# Patient Record
Sex: Female | Born: 1960
Health system: Southern US, Community
[De-identification: ages and names within clinical notes are randomized; demographics above are authoritative.]

## PROBLEM LIST (undated history)

## (undated) DIAGNOSIS — F0390 Unspecified dementia without behavioral disturbance: Secondary | ICD-10-CM

## (undated) DIAGNOSIS — M1990 Unspecified osteoarthritis, unspecified site: Secondary | ICD-10-CM

## (undated) DIAGNOSIS — F03918 Unspecified dementia, unspecified severity, with other behavioral disturbance: Secondary | ICD-10-CM

## (undated) DIAGNOSIS — J45909 Unspecified asthma, uncomplicated: Secondary | ICD-10-CM

## (undated) DIAGNOSIS — I10 Essential (primary) hypertension: Secondary | ICD-10-CM

## (undated) DIAGNOSIS — F0391 Unspecified dementia with behavioral disturbance: Secondary | ICD-10-CM

## (undated) DIAGNOSIS — F17200 Nicotine dependence, unspecified, uncomplicated: Secondary | ICD-10-CM

## (undated) HISTORY — PX: SALPINGOOPHORECTOMY: SHX82

## (undated) HISTORY — DX: Unspecified dementia with behavioral disturbance: F03.91

## (undated) HISTORY — PX: ANKLE SURGERY: SHX546

## (undated) HISTORY — DX: Nicotine dependence, unspecified, uncomplicated: F17.200

## (undated) HISTORY — DX: Unspecified dementia, unspecified severity, with other behavioral disturbance: F03.918

## (undated) HISTORY — PX: ABDOMINAL HYSTERECTOMY: SHX81

## (undated) HISTORY — PX: TOTAL ABDOMINAL HYSTERECTOMY W/ BILATERAL SALPINGOOPHORECTOMY: SHX83

---
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
## 2002-03-03 ENCOUNTER — Emergency Department (HOSPITAL_COMMUNITY): Admission: EM | Admit: 2002-03-03 | Discharge: 2002-03-03 | Payer: Self-pay | Admitting: Emergency Medicine

## 2002-06-17 ENCOUNTER — Emergency Department (HOSPITAL_COMMUNITY): Admission: EM | Admit: 2002-06-17 | Discharge: 2002-06-17 | Payer: Self-pay | Admitting: Emergency Medicine

## 2003-01-06 ENCOUNTER — Emergency Department (HOSPITAL_COMMUNITY): Admission: AD | Admit: 2003-01-06 | Discharge: 2003-01-06 | Payer: Self-pay | Admitting: Family Medicine

## 2003-01-13 ENCOUNTER — Encounter: Admission: RE | Admit: 2003-01-13 | Discharge: 2003-01-13 | Payer: Self-pay | Admitting: Family Medicine

## 2003-03-10 ENCOUNTER — Emergency Department (HOSPITAL_COMMUNITY): Admission: AD | Admit: 2003-03-10 | Discharge: 2003-03-10 | Payer: Self-pay | Admitting: Family Medicine

## 2003-08-19 ENCOUNTER — Encounter: Admission: RE | Admit: 2003-08-19 | Discharge: 2003-08-19 | Payer: Self-pay | Admitting: Family Medicine

## 2003-08-19 ENCOUNTER — Other Ambulatory Visit: Admission: RE | Admit: 2003-08-19 | Discharge: 2003-08-19 | Payer: Self-pay | Admitting: Family Medicine

## 2003-09-09 ENCOUNTER — Encounter: Admission: RE | Admit: 2003-09-09 | Discharge: 2003-09-09 | Payer: Self-pay | Admitting: Family Medicine

## 2003-10-27 ENCOUNTER — Ambulatory Visit: Payer: Self-pay | Admitting: Family Medicine

## 2004-01-14 ENCOUNTER — Encounter: Admission: RE | Admit: 2004-01-14 | Discharge: 2004-01-14 | Payer: Self-pay | Admitting: Sports Medicine

## 2004-03-29 ENCOUNTER — Emergency Department (HOSPITAL_COMMUNITY): Admission: EM | Admit: 2004-03-29 | Discharge: 2004-03-29 | Payer: Self-pay | Admitting: Family Medicine

## 2004-04-28 ENCOUNTER — Ambulatory Visit: Payer: Self-pay | Admitting: Family Medicine

## 2004-05-06 ENCOUNTER — Emergency Department (HOSPITAL_COMMUNITY): Admission: EM | Admit: 2004-05-06 | Discharge: 2004-05-06 | Payer: Self-pay | Admitting: Family Medicine

## 2005-03-21 ENCOUNTER — Emergency Department (HOSPITAL_COMMUNITY): Admission: EM | Admit: 2005-03-21 | Discharge: 2005-03-21 | Payer: Self-pay | Admitting: Emergency Medicine

## 2005-03-31 ENCOUNTER — Ambulatory Visit: Payer: Self-pay | Admitting: Sports Medicine

## 2005-05-20 ENCOUNTER — Emergency Department (HOSPITAL_COMMUNITY): Admission: EM | Admit: 2005-05-20 | Discharge: 2005-05-20 | Payer: Self-pay | Admitting: Emergency Medicine

## 2005-06-18 ENCOUNTER — Emergency Department (HOSPITAL_COMMUNITY): Admission: EM | Admit: 2005-06-18 | Discharge: 2005-06-18 | Payer: Self-pay | Admitting: Family Medicine

## 2005-07-17 ENCOUNTER — Emergency Department (HOSPITAL_COMMUNITY): Admission: EM | Admit: 2005-07-17 | Discharge: 2005-07-17 | Payer: Self-pay | Admitting: Emergency Medicine

## 2005-08-13 ENCOUNTER — Encounter (INDEPENDENT_AMBULATORY_CARE_PROVIDER_SITE_OTHER): Payer: Self-pay | Admitting: *Deleted

## 2005-08-13 LAB — CONVERTED CEMR LAB

## 2005-09-11 ENCOUNTER — Ambulatory Visit: Payer: Self-pay | Admitting: Sports Medicine

## 2005-09-11 ENCOUNTER — Other Ambulatory Visit: Admission: RE | Admit: 2005-09-11 | Discharge: 2005-09-11 | Payer: Self-pay | Admitting: Family Medicine

## 2005-12-27 ENCOUNTER — Emergency Department (HOSPITAL_COMMUNITY): Admission: EM | Admit: 2005-12-27 | Discharge: 2005-12-27 | Payer: Self-pay | Admitting: Emergency Medicine

## 2006-04-03 ENCOUNTER — Emergency Department (HOSPITAL_COMMUNITY): Admission: EM | Admit: 2006-04-03 | Discharge: 2006-04-03 | Payer: Self-pay | Admitting: Emergency Medicine

## 2006-04-12 DIAGNOSIS — F339 Major depressive disorder, recurrent, unspecified: Secondary | ICD-10-CM

## 2006-04-12 DIAGNOSIS — E669 Obesity, unspecified: Secondary | ICD-10-CM

## 2006-04-12 DIAGNOSIS — I10 Essential (primary) hypertension: Secondary | ICD-10-CM

## 2006-04-12 DIAGNOSIS — F17200 Nicotine dependence, unspecified, uncomplicated: Secondary | ICD-10-CM | POA: Insufficient documentation

## 2006-04-13 ENCOUNTER — Encounter (INDEPENDENT_AMBULATORY_CARE_PROVIDER_SITE_OTHER): Payer: Self-pay | Admitting: *Deleted

## 2006-06-27 ENCOUNTER — Telehealth (INDEPENDENT_AMBULATORY_CARE_PROVIDER_SITE_OTHER): Payer: Self-pay | Admitting: Family Medicine

## 2006-07-04 ENCOUNTER — Telehealth (INDEPENDENT_AMBULATORY_CARE_PROVIDER_SITE_OTHER): Payer: Self-pay | Admitting: Family Medicine

## 2006-07-31 ENCOUNTER — Telehealth (INDEPENDENT_AMBULATORY_CARE_PROVIDER_SITE_OTHER): Payer: Self-pay | Admitting: Family Medicine

## 2006-08-01 ENCOUNTER — Telehealth (INDEPENDENT_AMBULATORY_CARE_PROVIDER_SITE_OTHER): Payer: Self-pay | Admitting: Family Medicine

## 2006-08-02 ENCOUNTER — Telehealth (INDEPENDENT_AMBULATORY_CARE_PROVIDER_SITE_OTHER): Payer: Self-pay | Admitting: *Deleted

## 2006-08-03 ENCOUNTER — Telehealth (INDEPENDENT_AMBULATORY_CARE_PROVIDER_SITE_OTHER): Payer: Self-pay | Admitting: *Deleted

## 2007-03-18 ENCOUNTER — Encounter (INDEPENDENT_AMBULATORY_CARE_PROVIDER_SITE_OTHER): Payer: Self-pay | Admitting: Family Medicine

## 2007-03-18 ENCOUNTER — Ambulatory Visit: Payer: Self-pay | Admitting: Family Medicine

## 2007-03-18 ENCOUNTER — Other Ambulatory Visit: Admission: RE | Admit: 2007-03-18 | Discharge: 2007-03-18 | Payer: Self-pay | Admitting: Family Medicine

## 2007-03-18 LAB — CONVERTED CEMR LAB
BUN: 16 mg/dL (ref 6–23)
Bilirubin Urine: NEGATIVE
Blood in Urine, dipstick: NEGATIVE
CO2: 25 meq/L (ref 19–32)
Calcium: 9.3 mg/dL (ref 8.4–10.5)
Chloride: 106 meq/L (ref 96–112)
Cholesterol: 215 mg/dL — ABNORMAL HIGH (ref 0–200)
Creatinine, Ser: 0.81 mg/dL (ref 0.40–1.20)
Glucose, Urine, Semiquant: NEGATIVE
HDL: 60 mg/dL (ref >39)
Ketones, urine, test strip: NEGATIVE
LDL Cholesterol: 148 mg/dL — ABNORMAL HIGH (ref 0–99)
Potassium: 4.2 meq/L (ref 3.5–5.3)
Protein, U semiquant: NEGATIVE
Total Bilirubin: 0.4 mg/dL (ref 0.3–1.2)
Total CHOL/HDL Ratio: 3.6
Triglycerides: 36 mg/dL (ref <150)
VLDL: 7 mg/dL (ref 0–40)
WBC Urine, dipstick: NEGATIVE

## 2007-03-19 ENCOUNTER — Encounter (INDEPENDENT_AMBULATORY_CARE_PROVIDER_SITE_OTHER): Payer: Self-pay | Admitting: Family Medicine

## 2007-03-20 ENCOUNTER — Encounter (INDEPENDENT_AMBULATORY_CARE_PROVIDER_SITE_OTHER): Payer: Self-pay | Admitting: Family Medicine

## 2007-04-01 ENCOUNTER — Encounter: Payer: Self-pay | Admitting: *Deleted

## 2007-05-23 ENCOUNTER — Emergency Department (HOSPITAL_COMMUNITY): Admission: EM | Admit: 2007-05-23 | Discharge: 2007-05-23 | Payer: Self-pay | Admitting: Emergency Medicine

## 2007-06-12 ENCOUNTER — Ambulatory Visit: Payer: Self-pay | Admitting: Family Medicine

## 2007-06-12 ENCOUNTER — Telehealth: Payer: Self-pay | Admitting: *Deleted

## 2007-06-17 ENCOUNTER — Telehealth (INDEPENDENT_AMBULATORY_CARE_PROVIDER_SITE_OTHER): Payer: Self-pay | Admitting: Family Medicine

## 2007-06-26 ENCOUNTER — Telehealth: Payer: Self-pay | Admitting: *Deleted

## 2007-06-26 ENCOUNTER — Encounter (INDEPENDENT_AMBULATORY_CARE_PROVIDER_SITE_OTHER): Payer: Self-pay | Admitting: Family Medicine

## 2007-06-26 ENCOUNTER — Ambulatory Visit: Payer: Self-pay | Admitting: Family Medicine

## 2007-06-26 LAB — CONVERTED CEMR LAB
CO2: 26 meq/L (ref 19–32)
Calcium: 8.7 mg/dL (ref 8.4–10.5)
Chloride: 108 meq/L (ref 96–112)
Creatinine, Ser: 0.74 mg/dL (ref 0.40–1.20)
Glucose, Bld: 84 mg/dL (ref 70–99)

## 2007-06-28 ENCOUNTER — Encounter (INDEPENDENT_AMBULATORY_CARE_PROVIDER_SITE_OTHER): Payer: Self-pay | Admitting: Family Medicine

## 2007-07-10 ENCOUNTER — Encounter: Payer: Self-pay | Admitting: *Deleted

## 2007-07-10 ENCOUNTER — Ambulatory Visit: Payer: Self-pay | Admitting: Family Medicine

## 2007-07-10 ENCOUNTER — Encounter: Admission: RE | Admit: 2007-07-10 | Discharge: 2007-07-10 | Payer: Self-pay | Admitting: *Deleted

## 2007-07-10 LAB — CONVERTED CEMR LAB: Rapid Strep: NEGATIVE

## 2007-07-16 ENCOUNTER — Telehealth: Payer: Self-pay | Admitting: *Deleted

## 2007-08-05 ENCOUNTER — Encounter (INDEPENDENT_AMBULATORY_CARE_PROVIDER_SITE_OTHER): Payer: Self-pay | Admitting: Family Medicine

## 2007-08-05 ENCOUNTER — Emergency Department (HOSPITAL_COMMUNITY): Admission: EM | Admit: 2007-08-05 | Discharge: 2007-08-05 | Payer: Self-pay | Admitting: Emergency Medicine

## 2007-08-08 ENCOUNTER — Emergency Department (HOSPITAL_COMMUNITY): Admission: EM | Admit: 2007-08-08 | Discharge: 2007-08-08 | Payer: Self-pay | Admitting: Family Medicine

## 2007-08-12 ENCOUNTER — Telehealth: Payer: Self-pay | Admitting: *Deleted

## 2007-08-13 ENCOUNTER — Encounter: Payer: Self-pay | Admitting: Family Medicine

## 2007-08-13 ENCOUNTER — Ambulatory Visit: Payer: Self-pay | Admitting: Family Medicine

## 2007-08-19 ENCOUNTER — Encounter: Payer: Self-pay | Admitting: Family Medicine

## 2007-08-29 ENCOUNTER — Ambulatory Visit: Payer: Self-pay | Admitting: Family Medicine

## 2007-08-30 ENCOUNTER — Encounter: Payer: Self-pay | Admitting: Family Medicine

## 2007-10-04 ENCOUNTER — Telehealth: Payer: Self-pay | Admitting: *Deleted

## 2007-10-31 ENCOUNTER — Ambulatory Visit: Payer: Self-pay | Admitting: Family Medicine

## 2007-11-06 ENCOUNTER — Telehealth: Payer: Self-pay | Admitting: Family Medicine

## 2007-11-28 ENCOUNTER — Encounter: Payer: Self-pay | Admitting: *Deleted

## 2007-12-06 ENCOUNTER — Telehealth: Payer: Self-pay | Admitting: *Deleted

## 2007-12-06 ENCOUNTER — Ambulatory Visit: Payer: Self-pay | Admitting: Family Medicine

## 2007-12-07 ENCOUNTER — Encounter (INDEPENDENT_AMBULATORY_CARE_PROVIDER_SITE_OTHER): Payer: Self-pay | Admitting: Family Medicine

## 2007-12-20 ENCOUNTER — Emergency Department (HOSPITAL_COMMUNITY): Admission: EM | Admit: 2007-12-20 | Discharge: 2007-12-20 | Payer: Self-pay | Admitting: Family Medicine

## 2007-12-20 ENCOUNTER — Telehealth (INDEPENDENT_AMBULATORY_CARE_PROVIDER_SITE_OTHER): Payer: Self-pay | Admitting: *Deleted

## 2008-02-11 ENCOUNTER — Encounter: Payer: Self-pay | Admitting: Family Medicine

## 2008-02-11 ENCOUNTER — Ambulatory Visit: Payer: Self-pay | Admitting: Family Medicine

## 2008-02-11 ENCOUNTER — Telehealth: Payer: Self-pay | Admitting: *Deleted

## 2008-02-11 LAB — CONVERTED CEMR LAB
Bilirubin Urine: NEGATIVE
Blood in Urine, dipstick: NEGATIVE
Chlamydia, DNA Probe: NEGATIVE
GC Probe Amp, Genital: NEGATIVE
Urobilinogen, UA: 0.2
WBC Urine, dipstick: NEGATIVE
pH: 6

## 2008-02-12 ENCOUNTER — Encounter: Payer: Self-pay | Admitting: Family Medicine

## 2008-03-09 ENCOUNTER — Ambulatory Visit: Payer: Self-pay | Admitting: Family Medicine

## 2008-03-11 ENCOUNTER — Telehealth: Payer: Self-pay | Admitting: Family Medicine

## 2008-03-17 ENCOUNTER — Ambulatory Visit: Payer: Self-pay | Admitting: Family Medicine

## 2008-03-17 LAB — CONVERTED CEMR LAB: Whiff Test: POSITIVE

## 2008-03-24 ENCOUNTER — Telehealth: Payer: Self-pay | Admitting: Family Medicine

## 2008-04-22 ENCOUNTER — Telehealth: Payer: Self-pay | Admitting: Family Medicine

## 2008-04-22 ENCOUNTER — Ambulatory Visit: Payer: Self-pay | Admitting: Family Medicine

## 2008-05-06 ENCOUNTER — Encounter: Payer: Self-pay | Admitting: Family Medicine

## 2008-05-07 ENCOUNTER — Ambulatory Visit: Payer: Self-pay | Admitting: Family Medicine

## 2008-05-07 ENCOUNTER — Encounter: Payer: Self-pay | Admitting: Family Medicine

## 2008-05-11 ENCOUNTER — Ambulatory Visit: Payer: Self-pay | Admitting: Sports Medicine

## 2008-05-11 DIAGNOSIS — M2140 Flat foot [pes planus] (acquired), unspecified foot: Secondary | ICD-10-CM | POA: Insufficient documentation

## 2008-05-11 DIAGNOSIS — M7750 Other enthesopathy of unspecified foot: Secondary | ICD-10-CM

## 2008-05-12 ENCOUNTER — Encounter: Payer: Self-pay | Admitting: *Deleted

## 2008-06-01 ENCOUNTER — Telehealth: Payer: Self-pay | Admitting: Family Medicine

## 2008-06-01 ENCOUNTER — Ambulatory Visit: Payer: Self-pay | Admitting: Family Medicine

## 2008-06-01 LAB — CONVERTED CEMR LAB
Blood in Urine, dipstick: NEGATIVE
Glucose, Urine, Semiquant: NEGATIVE
Protein, U semiquant: NEGATIVE
Urobilinogen, UA: 0.2
Whiff Test: POSITIVE
pH: 5.5

## 2008-06-08 ENCOUNTER — Ambulatory Visit: Payer: Self-pay | Admitting: Family Medicine

## 2008-09-18 ENCOUNTER — Telehealth: Payer: Self-pay | Admitting: *Deleted

## 2008-09-18 ENCOUNTER — Telehealth: Payer: Self-pay | Admitting: Family Medicine

## 2008-09-23 ENCOUNTER — Ambulatory Visit: Payer: Self-pay | Admitting: Family Medicine

## 2008-09-25 ENCOUNTER — Telehealth: Payer: Self-pay | Admitting: Family Medicine

## 2008-09-28 ENCOUNTER — Telehealth: Payer: Self-pay | Admitting: *Deleted

## 2008-09-28 ENCOUNTER — Telehealth: Payer: Self-pay | Admitting: Psychology

## 2008-10-08 ENCOUNTER — Ambulatory Visit: Payer: Self-pay | Admitting: Psychology

## 2008-10-22 ENCOUNTER — Telehealth: Payer: Self-pay | Admitting: Psychology

## 2008-10-30 ENCOUNTER — Telehealth: Payer: Self-pay | Admitting: Psychology

## 2009-01-13 ENCOUNTER — Encounter (INDEPENDENT_AMBULATORY_CARE_PROVIDER_SITE_OTHER): Payer: Self-pay

## 2009-01-23 ENCOUNTER — Emergency Department (HOSPITAL_COMMUNITY): Admission: EM | Admit: 2009-01-23 | Discharge: 2009-01-23 | Payer: Self-pay | Admitting: Family Medicine

## 2009-04-22 ENCOUNTER — Encounter: Payer: Self-pay | Admitting: Family Medicine

## 2009-04-22 ENCOUNTER — Ambulatory Visit: Payer: Self-pay | Admitting: Family Medicine

## 2009-04-22 LAB — CONVERTED CEMR LAB
CO2: 30 meq/L (ref 19–32)
Calcium: 10.2 mg/dL (ref 8.4–10.5)
Chloride: 103 meq/L (ref 96–112)
GC Probe Amp, Genital: NEGATIVE
MCHC: 32.7 g/dL (ref 30.0–36.0)
MCV: 94 fL (ref 78.0–100.0)
Pap Smear: NEGATIVE
RBC: 4.49 M/uL (ref 3.87–5.11)

## 2009-04-27 ENCOUNTER — Encounter: Payer: Self-pay | Admitting: Family Medicine

## 2009-07-16 ENCOUNTER — Encounter: Payer: Self-pay | Admitting: Family Medicine

## 2009-07-16 ENCOUNTER — Ambulatory Visit: Payer: Self-pay | Admitting: Family Medicine

## 2009-08-06 ENCOUNTER — Ambulatory Visit: Payer: Self-pay | Admitting: Family Medicine

## 2009-09-08 ENCOUNTER — Telehealth: Payer: Self-pay | Admitting: Family Medicine

## 2009-09-08 ENCOUNTER — Ambulatory Visit: Payer: Self-pay | Admitting: Family Medicine

## 2009-10-27 ENCOUNTER — Ambulatory Visit: Payer: Self-pay | Admitting: Family Medicine

## 2009-10-28 ENCOUNTER — Telehealth: Payer: Self-pay | Admitting: *Deleted

## 2009-11-03 ENCOUNTER — Encounter: Payer: Self-pay | Admitting: Family Medicine

## 2009-11-03 ENCOUNTER — Ambulatory Visit: Payer: Self-pay | Admitting: Family Medicine

## 2009-11-08 ENCOUNTER — Encounter: Payer: Self-pay | Admitting: Family Medicine

## 2010-02-08 ENCOUNTER — Encounter: Payer: Self-pay | Admitting: Family Medicine

## 2010-03-15 NOTE — Assessment & Plan Note (Signed)
Summary: F/U [facility]H   Vital Signs:  Patient profile:   50 year old female Height:      65 inches Weight:      269 pounds BMI:     44.93 Temp:     98.3 degrees F oral Pulse rate:   94 / minute BP sitting:   103 / 70  (right arm) Cuff size:   large  Vitals Entered By: Tessie Fass CMA (November 03, 2009 8:57 AM) CC: F/U dizzines, pain meds, rash Is Patient Diabetic? No Pain Assessment Patient in pain? yes     Location: right foot Intensity: 10   Primary Care Provider:  Milinda Antis MD  CC:  F/U dizzines, pain meds, and rash.  History of Present Illness:    Pt here to follow-up on her dizziness, prescribed Meclizine and loratadine, also on Cirpodex for otitis externa  - dizziness improved, takes meclizine once or twice a day, feels her eas are improving but feels like they need to pop, nasal swelling also improved  - No fever, no cough  Pain- chronic left ankle pain, s/p arthroscopy, PT pending, pt using voltaren gel and lidocaine patches, previously on Norco as needed , out of meds. Pain worse after acitvity, seen by another ortho  Rash on right arm- itchy feels like bumps spreading, no drainage, no sick contacts   Pt did note today is the 1 year anniversay of her son's death, but she feels relief is not mourning any more  Habits & Providers  Alcohol-Tobacco-Diet     Tobacco Status: current     Tobacco Counseling: to quit use of tobacco products     Other Tobacco cigar     Other per week 1  Allergies: No Known Drug Allergies  Physical Exam  General:  Well-developed,well-nourished,in no acute distress; alert,appropriate and cooperative throughout examination Vital signs noted obese  Ears:  TM clear bilat, no fluid noted, no erythema in canals or over pinna, pinna non tender bilaterally Nose:  large turbinates bilaterally, edematous, clear rhinorrhea Mouth:  Oral mucosa and oropharynx without lesions or exudates.   Lungs:  CTAB Heart:  RRR, no murmur Skin:   mild fine maculopapular rash on right deltoid, no erythema    Impression & Recommendations:  Problem # 1:  DIZZINESS (ICD-780.4) Assessment Improved   treated ? allergic componenet Her updated medication list for this problem includes:    Wal-zyr 10 Mg Tabs (Cetirizine hcl) .[name]... Take 1 pill every evening for allergies and ear fluid    Meclizine Hcl 25 Mg Tabs (Meclizine hcl) .[name]... Take 1 tab every 8-12 hours as needed for dizziness.  Orders: [facility]- Est  Level 4 (40981)  Problem # 2:  ANKLE PAIN, RIGHT (ICD-719.47) Assessment: Unchanged  refilled pain meds, PT pending, chronic problem for pt, pain meds sparingly  Orders: [facility]- Est  Level 4 (19147)  Problem # 3:  SKIN RASH (ICD-782.1) Assessment: New  small areas of contact dermatitis, non specific, as needed cortisone Her updated medication list for this problem includes:    Hydrocortisone 1 % Crea (Hydrocortisone) .[name]... Apply to area on arm two times a day as needed itch  Orders: [facility]- Est  Level 4 (99214)  Complete Medication List: 1)  Lotensin 40 Mg Tabs (Benazepril hcl) .[name].. 1 by mouth daily 2)  Hydrochlorothiazide 25 Mg Tabs (Hydrochlorothiazide) .[name].. 1 tab by mouth daily 3)  Flexeril 5 Mg Tabs (Cyclobenzaprine hcl) .[name].. 1 by mouth at bedtime 4)  Lexapro 20 Mg Tabs (Escitalopram oxalate) .Marland KitchenMarland KitchenMarland Kitchen  1 by mouth daily for mood 5)  Norco 5-325 Mg Tabs (Hydrocodone-acetaminophen) .[name].. 1 by mouth q 6hrs as needed pain 6)  Flonase 50 Mcg/act Susp (Fluticasone propionate) .[name].. 1 spray each nostril as needed 7)  Voltaren 1 % Gel (Diclofenac sodium) .... Apply to foot 3-4 times daily as directed as needed for pain disp: 100g 8)  Fluconazole 150 Mg Tabs (Fluconazole) .[name].. 1 tab by mouth x 1 dose.  repeat in 3 days. 9)  Lidoderm 5 % Ptch (Lidocaine) .... Apply to affected area for no more than 12 hours a day disp: 1 box 10)  Miralax Powd (Polyethylene glycol 3350) 11)  Ciprodex 0.3-0.1 % Susp (Ciprofloxacin-dexamethasone) .... 4 drops in  each ear two times a day x 7 days for ear infection. 1 bottle 12)  Wal-zyr 10 Mg Tabs (Cetirizine hcl) .... Take 1 pill every evening for allergies and ear fluid 13)  Meclizine Hcl 25 Mg Tabs (Meclizine hcl) .... Take 1 tab every 8-12 hours as needed for dizziness. 14)  Hydrocortisone 1 % Crea (Hydrocortisone) .... Apply to area on arm two times a day as needed itch  Patient Instructions: 1)  Follow up in 6 months for your blood pressure 2)  Continue with physical therapy 3)  If the rash does not improve then return Prescriptions: NORCO 5-325 MG TABS (HYDROCODONE-ACETAMINOPHEN) 1 by mouth q 6hrs as needed pain  #60 x 0   Entered and Authorized by:   Milinda Antis MD   Signed by:   Milinda Antis MD on 11/03/2009   Method used:   Handwritten   RxID:   [id number] LEXAPRO 20 MG TABS (ESCITALOPRAM OXALATE) 1 by mouth daily for mood  #30 x 3   Entered and Authorized by:   Milinda Antis MD   Signed by:   Milinda Antis MD on 11/03/2009   Method used:   Electronically to        [facility] [address] [phone]* (retail)       [address]       [address]       [place], [place]  [address]       Ph: [phone]       Fax: [phone]   RxID:   [id number] FLONASE 50 MCG/ACT SUSP (FLUTICASONE PROPIONATE) 1 spray each nostril as needed  #1 x 0   Entered and Authorized by:   Milinda Antis MD   Signed by:   Milinda Antis MD on 11/03/2009   Method used:   Electronically to        [facility] [address] [phone]* (retail)       [address]       [address]       [place], [place]  [address]       Ph: [phone]       Fax: [phone]   RxID:   [id number] FLEXERIL 5 MG TABS (CYCLOBENZAPRINE HCL) 1 by mouth at bedtime  #30 x 3   Entered and Authorized by:   Milinda Antis MD   Signed by:   Milinda Antis MD on 11/03/2009   Method used:   Electronically to        [facility] [address] [phone]* (retail)       [address]       [address]       [place], [place]   [address]       Ph: [phone]       Fax: [phone]  RxID:   [id number] HYDROCHLOROTHIAZIDE 25 MG TABS (HYDROCHLOROTHIAZIDE) 1 tab by mouth daily  #31 x 4   Entered and Authorized by:   Milinda Antis MD   Signed by:   Milinda Antis MD on 11/03/2009   Method used:   Electronically to        [facility] [address] [phone]* (retail)       [address]       [address]       [place], [place]  [address]       Ph: [phone]       Fax: [phone]   RxID:   [id number] LOTENSIN 40 MG  TABS (BENAZEPRIL HCL) 1 by mouth daily  #34 x 3   Entered and Authorized by:   Milinda Antis MD   Signed by:   Milinda Antis MD on 11/03/2009   Method used:   Electronically to        [facility] [address] [phone]* (retail)       [address]       [address]       [place], [place]  [address]       Ph: [phone]       Fax: [phone]   RxID:   [id number] HYDROCORTISONE 1 % CREA (HYDROCORTISONE) apply to area on arm two times a day as needed itch  #1 x 0   Entered and Authorized by:   Milinda Antis MD   Signed by:   Milinda Antis MD on 11/03/2009   Method used:   Electronically to        [facility] [address] [phone]* (retail)       [address]       [address]       [place], [place]  [address]       Ph: [phone]       Fax: [phone]   RxID:   [id number]

## 2010-03-15 NOTE — Assessment & Plan Note (Signed)
Summary: f/u bug bite,df   Vital Signs:  Patient profile:   50 year old female Height:      65 inches Weight:      265 pounds BMI:     44.26 Temp:     98.3 degrees F BP sitting:   134 / 82  (left arm) Cuff size:   large  Vitals Entered By: Tessie Fass CMA (August 06, 2009 10:25 AM) CC: F/U bug bite? Is Patient Diabetic? No   Primary Care Provider:  Milinda Antis MD  CC:  F/U bug bite?[name]  History of Present Illness: Sherry Hicks comes in to follow-up ankle pain and bug bite. 1) Bug bite - finished doxycycline.  Did warm compresses.  "oozed" for a few days, but has gotten much smaller.  Still worried about it though.  It is tender.  Seh presses on it and "messes with it" many times during the day 2) Ankle pain - feels better with lidoderm patches.  About to run out.   Allergies: No Known Drug Allergies  Physical Exam  General:  NAD, Vital signs noted obese Extremities:  no swelling of right ankle.  FROM Skin:  <.5 x .5 cm  area of induration.  at approx 4-5 o'clock in relation to umbilicus.  no surrounding erythema but there is surrounding skin darkening and peeling. Nontnder.  NO fluctuance   Impression & Recommendations:  Problem # 1:  CARBUNCLE AND FURUNCLE OF TRUNK (ICD-680.2) Assessment Improved  Improving. Nearly resolved.  Advised to just leave it along - don't mess with it or apply anything to it anymore.   Orders: [facility]- Est Level  3 (86578)  Problem # 2:  ANKLE PAIN, RIGHT (ICD-719.47) Assessment: Unchanged  Due to bone spurs and arthritis.  REfilled lidoderm patches.   Orders: [facility]- Est Level  3 (46962)  Complete Medication List: 1)  Lotensin 40 Mg Tabs (Benazepril hcl) .[name].. 1 by mouth daily 2)  Hydrochlorothiazide 25 Mg Tabs (Hydrochlorothiazide) .[name].. 1 tab by mouth daily 3)  Flexeril 5 Mg Tabs (Cyclobenzaprine hcl) .[name].. 1 by mouth at bedtime 4)  Lexapro 10 Mg Tabs (Escitalopram oxalate) .[name].. 1 by mouth daily x 1 week then increase to 2 tablets daily 5)   Norco 5-325 Mg Tabs (Hydrocodone-acetaminophen) .[name].. 1 by mouth q 6hrs as needed pain 6)  Flonase 50 Mcg/act Susp (Fluticasone propionate) .[name].. 1 spray each nostril as needed 7)  Doxycycline Hyclate 100 Mg Caps (Doxycycline hyclate) .[name].. 1 by mouth two times a day for 14 days 8)  Voltaren 1 % Gel (Diclofenac sodium) .... Apply to foot 3-4 times daily as directed as needed for pain disp: 100g 9)  Fluconazole 150 Mg Tabs (Fluconazole) .[name].. 1 tab by mouth x 1 dose.  repeat in 3 days. 10)  Lidoderm 5 % Ptch (Lidocaine) .... Apply to affected area for no more than 12 hours a day disp: 1 box Prescriptions: LIDODERM 5 % PTCH (LIDOCAINE) apply to affected area for no more than 12 hours a day disp: 1 box  #1 x 0   Entered by:   Tessie Fass CMA   Authorized by:   Ardeen Garland  MD   Signed by:   Tessie Fass CMA on 08/06/2009   Method used:   Electronically to        [facility] [address] [phone]* (retail)       [address]       [place], [place]  [address]       Ph: [phone] or [phone]       Fax: [phone]   RxID:   [id number]

## 2010-03-15 NOTE — Miscellaneous (Signed)
Summary: [facility] went to [facility]  Clinical Lists Changes rec'd a call from Ida at [facility]. pt presented there for a bug bite. we have an 11am opening. asked her to send her here. I could hear her fussing in a loud voice in the backround.  I went to front area to tell staff she was coming & Malachi Bonds called back warning me that she was "throwing a fit". I called security in case we need them. she then walked in & loudly said "who is Kennon Rounds" I said I am. she was very loud and rude saying "what do I have to do to get seen for this bug bite?" she was very upset & wants to be seen now. told her we have an 11am work in ,but it will not be now. she sat for a minute, all the time talking on the phone about [place]. then she went outside and was joined by a security man. I spoke to him after he came back in & he said we told her she would be seen at 11am. told him no, that is the work in slot & there may be a wait. she needs to call for an appt first. if we are on her medicaid card, she needs to see [place]. informed Dennison Nancy RN.Golden Circle RN  July 16, 2009 10:56 AM  I did the initail paperwork with her. she was alternately angry then cried. asked who called the cops. I told her I called security based on what I heard on the phone from [facility].  she related several things that are going on right now in her life. smiling & talking about what a good time she had at the beach. has been up since 5am today with the pain from this bite. she was appropriate when I left the room.Golden Circle RN  July 16, 2009 12:21 PM

## 2010-03-15 NOTE — Progress Notes (Signed)
Summary: Appt?  Phone Note Call from Patient Call back at [phone]   Reason for Call: Talk to Nurse Summary of Call: pt lvg town next weds & needs to know who she needs to see to get her pain meds, MD is not in clinic until 8/10 Initial call taken by: Knox Royalty,  September 08, 2009 4:53 PM  Follow-up for Phone Call        Refilled one month supply as patient is leaving town, is at appropriate time for refills. Advised to follow up with Dr. Jeanice Lim in one month for any chronic issues if needed. Script to be faxed to [facility]  Follow-up by: Bobby Rumpf  MD,  September 10, 2009 9:52 AM    Prescriptions: NORCO 5-325 MG TABS (HYDROCODONE-ACETAMINOPHEN) 1 by mouth q 6hrs as needed pain  #60 x 0   Entered and Authorized by:   Bobby Rumpf  MD   Signed by:   Bobby Rumpf  MD on 09/10/2009   Method used:   Print then Mail to Patient   RxID:   [id number]

## 2010-03-15 NOTE — Miscellaneous (Signed)
Summary: prior auth form to pcp  Clinical Lists Changes prior auth for lexapro to pcp to complete.Golden Circle RN  November 08, 2009 3:08 PM  completed Milinda Antis MD  November 08, 2009 4:23 PM  Appended Document: prior auth form to pcp Recieved a fax medication approved through 11/10/2010  Ticket # [id number]  Reviewer Gwyndolyn Kaufman            ID# [id number]

## 2010-03-15 NOTE — Assessment & Plan Note (Signed)
Summary: Student Behavioral Health Consultation   Primary Care Provider:  Milinda Antis MD   History of Present Illness: Sherry Hicks has a history of smoking cigarettes. Over one and a half years ago she had quit smoking cigarettes for a period of time. After becoming depressed, she began to smoke black and milds, rationalizign this behavior as it was better than a pack of cigarettes. Now Sherry Hicks realizes that it is just as bad as a pack of cigarettes.   Allergies: No Known Drug Allergies   Impression & Recommendations:  Problem # 1:  TOBACCO DEPENDENCE (ICD-305.1) Sherry Hicks is currently between the contemplative and action phase of change. She reported that it is a 10-plus on a scale of 1-10 in regard to importance of quitting and that she is about a 9 on the same scale in regard to confidence in quitting. She cited family as the primary motivation for her desire to quit. Specifically, her grandson asks her to quit smoking and makes her feel quilty when she does smoke. In addition, she is going to visit her grandmother who will not allow smoking. Sherry Hicks wants to be able to spend quality time with her grandmother and not get punished by her for smoking. She reported that she will quit "cold [place]" and has family supports that will help her with quitting as well as will be supportive when times of stress arise which increase her desire for smoking. Sherry Hicks also reported distraction as a good strategy for her. Sherry Hicks has not set a quit date, but said it will be in the next 2 weeks so she can visit her grandmother.   It may be beneficial to continue to follow-up about smoking cessation. Sherry Hicks was provided the 800-Quit-Now number and was excited to use this resource. May reccommend smoking cessation class in the future.    Delphia Grates  Behavioral Medicine Student  November 03, 2009 10:48 AM   Complete Medication List: 1)  Lotensin 40 Mg Tabs (Benazepril hcl) .[name].. 1 by mouth  daily 2)  Hydrochlorothiazide 25 Mg Tabs (Hydrochlorothiazide) .[name].. 1 tab by mouth daily 3)  Flexeril 5 Mg Tabs (Cyclobenzaprine hcl) .[name].. 1 by mouth at bedtime 4)  Lexapro 20 Mg Tabs (Escitalopram oxalate) .[name].. 1 by mouth daily for mood 5)  Norco 5-325 Mg Tabs (Hydrocodone-acetaminophen) .[name].. 1 by mouth q 6hrs as needed pain 6)  Flonase 50 Mcg/act Susp (Fluticasone propionate) .[name].. 1 spray each nostril as needed 7)  Voltaren 1 % Gel (Diclofenac sodium) .... Apply to foot 3-4 times daily as directed as needed for pain disp: 100g 8)  Fluconazole 150 Mg Tabs (Fluconazole) .[name].. 1 tab by mouth x 1 dose.  repeat in 3 days. 9)  Lidoderm 5 % Ptch (Lidocaine) .... Apply to affected area for no more than 12 hours a day disp: 1 box 10)  Miralax Powd (Polyethylene glycol 3350) 11)  Ciprodex 0.3-0.1 % Susp (Ciprofloxacin-dexamethasone) .... 4 drops in each ear two times a day x 7 days for ear infection. 1 bottle 12)  Wal-zyr 10 Mg Tabs (Cetirizine hcl) .... Take 1 pill every evening for allergies and ear fluid 13)  Meclizine Hcl 25 Mg Tabs (Meclizine hcl) .... Take 1 tab every 8-12 hours as needed for dizziness. 14)  Hydrocortisone 1 % Crea (Hydrocortisone) .... Apply to area on arm two times a day as needed itch

## 2010-03-15 NOTE — Assessment & Plan Note (Signed)
Summary: sinus problem,tcb   Vital Signs:  Patient profile:   50 year old female Height:      65 inches Weight:      263 pounds BMI:     43.92 Temp:     98.1 degrees F oral Pulse rate:   83 / minute BP sitting:   140 / 82  (left arm) Cuff size:   large  Vitals Entered By: Tessie Fass CMA (September 08, 2009 4:07 PM) CC: sinus infection Is Patient Diabetic? No   Primary Provider:  Milinda Antis MD  CC:  sinus infection.  History of Present Illness: Pt comes to clinic today with a complaint of possible sinus infection. She has been having nasal congestion and drainage for the past week or so. Has been using flonase multiple times a day, but is still having some minor nose bleeding (on the tissue when she blows or on the Q-tips when she wipes out her nose). Feels like the inside and outside of her nose are red and swollen. Her nose is sore. Ears are itching but no drainage or pain. Has a "tickle" in her throat, occ. cough. Increased pain and pressure behind her eyes, worse with bending over. Pt also complains of right ankle pain and wants more pain meds. Pt complains of constipation. Pt complains of issues with getting into physical therapy.  Current Problems (verified): 1)  Unspecified Sinusitis  (ICD-473.9) 2)  Sexually Transmitted Disease, Exposure To  (ICD-V01.6) 3)  Screening For Malignant Neoplasm of The Cervix  (ICD-V76.2) 4)  Depression, Major, Recurrent  (ICD-296.30) 5)  Hypertension, Benign Systemic  (ICD-401.1) 6)  Other Enthesopathy of Ankle and Tarsus  (ICD-726.79) 7)  Pes Planus  (ICD-734) 8)  Ankle Pain, Right  (ICD-719.47) 9)  Bone Spur  (ICD-726.91) 10)  Tobacco Dependence  (ICD-305.1) 11)  Obesity, Nos  (ICD-278.00)  Current Medications (verified): 1)  Lotensin 40 Mg  Tabs (Benazepril Hcl) .[name].. 1 By Mouth Daily 2)  Hydrochlorothiazide 25 Mg Tabs (Hydrochlorothiazide) .[name].. 1 Tab By Mouth Daily 3)  Flexeril 5 Mg Tabs (Cyclobenzaprine Hcl) .[name].. 1 By Mouth At  Bedtime 4)  Lexapro 10 Mg Tabs (Escitalopram Oxalate) .[name].. 1 By Mouth Daily X 1 Week Then Increase To 2 Tablets Daily 5)  Norco 5-325 Mg Tabs (Hydrocodone-Acetaminophen) .[name].. 1 By Mouth Q 6hrs As Needed Pain 6)  Flonase 50 Mcg/act Susp (Fluticasone Propionate) .[name].. 1 Spray Each Nostril As Needed 7)  Voltaren 1 % Gel (Diclofenac Sodium) .... Apply To Foot 3-4 Times Daily As Directed As Needed For Pain Disp: 100g 8)  Fluconazole 150 Mg Tabs (Fluconazole) .[name].. 1 Tab By Mouth X 1 Dose.  Repeat in 3 Days. 9)  Lidoderm 5 % Ptch (Lidocaine) .... Apply To Affected Area For No More Than 12 Hours A Day Disp: 1 Box 10)  Amoxicillin 500 Mg Caps (Amoxicillin) .... Take 1 Tab By Mouth Three Times A Day For 10 Days 11)  Miralax  Powd (Polyethylene Glycol 3350) 12)  Afrin Nasal Spray 0.05 % Soln (Oxymetazoline Hcl)  Allergies (verified): No Known Drug Allergies  Physical Exam  General:  Well-developed,well-nourished,in no acute distress; alert,appropriate and cooperative throughout examination Ears:  External ear exam shows no significant lesions or deformities.  Otoscopic examination reveals clear canals, tympanic membranes are intact bilaterally without bulging, retraction, inflammation or discharge. Hearing is grossly normal bilaterally. Nose:  no external deformity, no nasal discharge, no active bleeding or clots, mucosal erythema, mucosal edema, L frontal sinus tenderness, and R frontal  sinus tenderness.   Mouth:  Oral mucosa and oropharynx without lesions or exudates.  Teeth in good repair. Mild pharyngeal erythema.  Neck:  No deformities, masses, or tenderness noted.   Impression & Recommendations:  Problem # 1:  UNSPECIFIED SINUSITIS (ICD-473.9) Assessment New  Appears to be uncomplicated sinusitis. Amox x 10 days, afrin as needed. Her updated medication list for this problem includes:    Flonase 50 Mcg/act Susp (Fluticasone propionate) .[name]... 1 spray each nostril as needed    Amoxicillin 500  Mg Caps (Amoxicillin) .[name]... Take 1 tab by mouth three times a day for 10 days    Afrin Nasal Spray 0.05 % Soln (Oxymetazoline hcl)  Orders: [facility]- Est Level  3 (45409)  Problem # 2:  OTHER ENTHESOPATHY OF ANKLE AND TARSUS (ICD-726.79) Assessment: Unchanged Deferred to PCP. She will make appt to see Dr. Jeanice Lim for PT issues and pain meds.  Problem # 3:  CONSTIPATION (ICD-564.00)  Will have her f/u with PCP to discuss colonoscopy since this appears to be an ongoing issue and she has a 1st degree relative wtih colon cancer. Miralax as needed.  Her updated medication list for this problem includes:    Miralax Powd (Polyethylene glycol 3350)  Orders: [facility]- Est Level  3 (81191)  Complete Medication List: 1)  Lotensin 40 Mg Tabs (Benazepril hcl) .[name].. 1 by mouth daily 2)  Hydrochlorothiazide 25 Mg Tabs (Hydrochlorothiazide) .[name].. 1 tab by mouth daily 3)  Flexeril 5 Mg Tabs (Cyclobenzaprine hcl) .[name].. 1 by mouth at bedtime 4)  Lexapro 10 Mg Tabs (Escitalopram oxalate) .[name].. 1 by mouth daily x 1 week then increase to 2 tablets daily 5)  Norco 5-325 Mg Tabs (Hydrocodone-acetaminophen) .[name].. 1 by mouth q 6hrs as needed pain 6)  Flonase 50 Mcg/act Susp (Fluticasone propionate) .[name].. 1 spray each nostril as needed 7)  Voltaren 1 % Gel (Diclofenac sodium) .... Apply to foot 3-4 times daily as directed as needed for pain disp: 100g 8)  Fluconazole 150 Mg Tabs (Fluconazole) .[name].. 1 tab by mouth x 1 dose.  repeat in 3 days. 9)  Lidoderm 5 % Ptch (Lidocaine) .... Apply to affected area for no more than 12 hours a day disp: 1 box 10)  Amoxicillin 500 Mg Caps (Amoxicillin) .... Take 1 tab by mouth three times a day for 10 days 11)  Miralax Powd (Polyethylene glycol 3350) 12)  Afrin Nasal Spray 0.05 % Soln (Oxymetazoline hcl)  Patient Instructions: 1)  We have diagnosed you with a sinus infection. I am giving you a prescription for an antibiotic. Take all of the pills. You can use some Afrin for 3 days if you  would like to help with the sinus pressure.  2)  You may use Miralax, over the counter, for your constipation. This could be caused by some of the medications that you are on. Please follow up with Dr. Jeanice Lim about this issue. 3)  Please schedule an appt with Dr. Jeanice Lim today on your way out. Prescriptions: AMOXICILLIN 500 MG CAPS (AMOXICILLIN) take 1 tab by mouth three times a day for 10 days  #30 x 0   Entered and Authorized by:   Demetria Pore MD   Signed by:   Demetria Pore MD on 09/08/2009   Method used:   Electronically to        [facility] [address] [phone]* (retail)       [address]       [place]  [place], [place]  [address]       Ph: [phone] or [phone]       Fax: [phone]   RxID:   [id number]

## 2010-03-15 NOTE — Letter (Signed)
Summary: Lab-Female  All     ,     Phone:   Fax:     04/27/2009        [facility] [address] [place], [place]  [address]   Dear Ms. Kise:  We have carefully reviewed the results of your tests noted below and the results are:  PREVENTIVE CARE TESTING:   Mammogram: Done. on 09/13/2005 - MAMMOGRAM OVERDUE   PAP smear: NEGATIVE FOR INTRAEPITHELIAL LESIONS OR MALIGNANCY. on 04/22/2009    Your STD screen was negative   Your next PAP Smear is due in March 2012.  If you have any questions, please call. We appreciate being able to work with you.   Sincerely,   Milinda Antis MD Typed by: Milinda Antis MD  Appended Document: Lab-Female mailed.  Appended Document: Lab-Female mailed.

## 2010-03-15 NOTE — Assessment & Plan Note (Signed)
Summary: cpe/pap,tcb   Vital Signs:  Patient profile:   50 year old female Height:      66.5 inches Weight:      269 pounds BMI:     42.92 BSA:     2.28 Temp:     99.0 degrees F Pulse rate:   81 / minute BP sitting:   124 / 84  Vitals Entered By: Jone Baseman CMA (April 22, 2009 4:00 PM) CC: CPP/ ortho referral Is Patient Diabetic? No Pain Assessment Patient in pain? yes     Location: right foot  Intensity: 10   Primary Care Provider:  Milinda Antis MD  CC:  CPP/ ortho referral.  History of Present Illness:  CPP: Concerned about persistant pain in right ankle. S/P arthroscopy > 1 year ago, continued episodes of pain and swelling at the ankle. Out of pain meds and muscle relaxants, uses a cream on foot for burning sensation. Continues to do ROM. Pain with increased acitivity. Would like a second opinion from different orthopedic surgeon  Has not had Mammogram No abnormal bleeidng, no discharge, no recent illness Will check paper charts for Hysterectomy dates- pt unsure  HTN- tolerating BP meds, no side effects   Habits & Providers  Alcohol-Tobacco-Diet     Tobacco Status: current     Tobacco Counseling: to quit use of tobacco products     Other Tobacco cigar     Other per week 4  Allergies: No Known Drug Allergies  Past History:  Past Medical History: HTN Depression Tobacco Abuse  Obestiy Chronic ankle Pain  Past Surgical History: Hysterectomy & BSO - 08/26/2003 Right ankle Arthroscopy 2009- Dr. Lajoyce Corners For non healing sprain/bone spur  Social History: Reviewed history from 09/23/2008 and no changes required. Single. Lives with 81 y/o daughter. 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 Started smoking at 50 years old. Smokes 1 pack per week. No alcohol. Remote history of marijuana and cocaine use. No regular exercise. Son murdered June 2010  Physical Exam  General:  NAD, Vital signs noted obese Breasts:  No  mass, nodules, thickening, tenderness, bulging, retraction, inflamation, nipple discharge or skin changes noted.  Bilat inverted nipples Lungs:  Normal respiratory effort, chest expands symmetrically. Lungs are clear to auscultation, no crackles or wheezes. Heart:  Normal rate and regular rhythm. S1 and S2 normal without gallop, murmur, click, rub or other extra sounds. Abdomen:  soft, non-tender, no distention, and no masses.   Genitalia:  normal introitus, no external lesions, no vaginal discharge, mucosa pink and moist, no vaginal or cervical lesions, no vaginal atrophy, and no friaility or hemorrhage.  Difficulty visalizing cervical os, appears to have cervical componenet on exam, uterus not palpated, hisotry of oophoretomy- unable to confirm secondary to habitus. Scraping for PAP taken for most posterior componenet of what appeared to be cervical tissue. Msk:  Right ankle/foot- well healed arthroscopy sites. No erythema, no swelling, no warmth. TTP 0.5cm below medial malleous, normal ROM    Impression & Recommendations:  Problem # 1:  Preventive Health Care (ICD-V70.0) Routine exam done, given handout for mammogram Cultures done- h/o STD exposure, infidelity from partner Yearly labs done, direct LDL as not fasting and does not come in often Noted hysterctomy, appears to have cervical componenet, difficult to visualzise OS. PAP sent  Preceptor Dr.Breen also attempted PAP  Problem # 2:  ANKLE PAIN, RIGHT (ICD-719.47) Assessment: Deteriorated No new deficits on exam, advised pt that she may have chronic ankle  pain, but will send for second opinon. continue ROM, supprotive care. Orders: [facility] - Est  40-64 yrs ([address]40) Orthopedic Surgeon Referral (Ortho Surgeon)  Problem # 3:  HYPERTENSION, BENIGN SYSTEMIC (ICD-401.1) Assessment: Unchanged  Her updated medication list for this problem includes:    Lotensin 40 Mg Tabs (Benazepril hcl) .[name]... 1 by mouth daily    Hydrochlorothiazide 25 Mg  Tabs (Hydrochlorothiazide) .[name]... 1 tab by mouth daily  Orders: Basic Met-[facility] ([id number]) CBC-[facility] ([address]) Direct LDL-[facility] [phone]) [facility] - Est  40-64 yrs ([address]52)  Problem # 4:  OBESITY, NOS (ICD-278.00) Assessment: Unchanged  Orders: [facility] - Est  40-64 yrs ([address])  Complete Medication List: 1)  Lotensin 40 Mg Tabs (Benazepril hcl) .[name].. 1 by mouth daily 2)  Hydrochlorothiazide 25 Mg Tabs (Hydrochlorothiazide) .[name].. 1 tab by mouth daily 3)  Flexeril 5 Mg Tabs (Cyclobenzaprine hcl) .[name].. 1 by mouth at bedtime 4)  Lexapro 10 Mg Tabs (Escitalopram oxalate) .[name].. 1 by mouth daily x 1 week then increase to 2 tablets daily 5)  Norco 5-325 Mg Tabs (Hydrocodone-acetaminophen) .[name].. 1 by mouth q 6hrs as needed pain 6)  Flonase 50 Mcg/act Susp (Fluticasone propionate) .[name].. 1 spray each nostril as needed  Other Orders: Wet Prep- [facility] [phone]) Pap Smear-[facility] ([id number]) GC/Chlamydia-[facility] (87591/87491)  Patient Instructions: 1)  Your blood pressure looks great today, continue your medications 2)  For your foot I will refer you to Delbert Harness 3)  Your PAP smear results will come back in 1 week, if everything is normal then I will send you a letter. 4)  Please schedule your Mammogram 5)  If any of your testing comes back positive I will call you if treatment is needed Prescriptions: NORCO 5-325 MG TABS (HYDROCODONE-ACETAMINOPHEN) 1 by mouth q 6hrs as needed pain  #60 x 0   Entered and Authorized by:   Milinda Antis MD   Signed by:   Milinda Antis MD on 04/22/2009   Method used:   Handwritten   RxID:   [id number] LOTENSIN 40 MG  TABS (BENAZEPRIL HCL) 1 by mouth daily  #34 x 3   Entered and Authorized by:   Milinda Antis MD   Signed by:   Milinda Antis MD on 04/22/2009   Method used:   Electronically to        [facility] [address] [phone]* (retail)       [address]       [address]       [place], [place]  [address]       Ph: [phone]       Fax: [phone]   RxID:    [id number] HYDROCHLOROTHIAZIDE 25 MG TABS (HYDROCHLOROTHIAZIDE) 1 tab by mouth daily  #31 x 4   Entered and Authorized by:   Milinda Antis MD   Signed by:   Milinda Antis MD on 04/22/2009   Method used:   Electronically to        [facility] [address] [phone]* (retail)       [address]       [address]       [place], [place]  [address]       Ph: [phone]       Fax: [phone]   RxID:   [id number] LEXAPRO 10 MG TABS (ESCITALOPRAM OXALATE) 1 by mouth daily x 1 week then increase to 2 tablets daily  #60 x 2   Entered and Authorized by:   Milinda Antis MD   Signed by:  Milinda Antis MD on 04/22/2009   Method used:   Electronically to        [facility] [address] [phone]* (retail)       [address]       [address]       [place], [place]  [address]       Ph: [phone]       Fax: [phone]   RxID:   [id number] FLEXERIL 5 MG TABS (CYCLOBENZAPRINE HCL) 1 by mouth at bedtime  #30 x 2   Entered and Authorized by:   Milinda Antis MD   Signed by:   Milinda Antis MD on 04/22/2009   Method used:   Electronically to        [facility] [address] [phone]* (retail)       [address]       [address]       [place], [place]  [address]       Ph: [phone]       Fax: [phone]   RxID:   [id number] FLONASE 50 MCG/ACT SUSP (FLUTICASONE PROPIONATE) 1 spray each nostril as needed  #1 x 0   Entered and Authorized by:   Milinda Antis MD   Signed by:   Milinda Antis MD on 04/22/2009   Method used:   Historical   RxID:   [id number]   Laboratory Results  Date/Time Received: April 22, 2009 4:48 PM  Date/Time Reported: April 22, 2009 4:54 PM   [facility] Source: vaginal WBC/hpf: rare Bacteria/hpf: 3+ Clue cells/hpf: none Yeast/hpf: none Trichomonas/hpf: none Comments: ...........test performed by...........Marland KitchenTerese Door, CMA     LDL Result Date:  04/22/2009    Prevention & Chronic Care Immunizations   Influenza vaccine:  Fluvax 3+  (03/18/2007)   Influenza vaccine due: 03/17/2008    Tetanus booster: 08/13/2005: Done.   Tetanus booster due: 08/14/2015    Pneumococcal vaccine: Not documented  Other Screening   Pap smear: normal  (03/20/2007)   Pap smear due: 03/19/2008    Mammogram: Done.  (09/13/2005)   Mammogram due: 09/14/2006   Smoking status: current  (04/22/2009)   Smoking cessation counseling: yes  (03/17/2008)  Lipids   Total Cholesterol: 215  (03/18/2007)   LDL: 148  (03/18/2007)   LDL Direct: Not documented   HDL: 60  (03/18/2007)   Triglycerides: 36  (03/18/2007)  Hypertension   Last Blood Pressure: 124 / 84  (04/22/2009)   Serum creatinine: 0.74  (06/26/2007)   Serum potassium 4.3  (06/26/2007)    Hypertension flowsheet reviewed?: Yes   Progress toward BP goal: At goal  Self-Management Support :   Personal Goals (by the next clinic visit) :      Personal blood pressure goal: 140/90  (04/22/2009)   Hypertension self-management support: Not documented  Appended Document: cpe/pap,tcb per chart hysterectomy 1989

## 2010-03-15 NOTE — Progress Notes (Signed)
  Phone Note Outgoing Call   Call placed by: Loralee Pacas CMA,  October 28, 2009 12:05 PM Summary of Call: called to check on status of pt. she stated that she is taking the meds however; she told me that she is only getting up to use the bathroom and has not really had any problems but she will come in if she is not feeling any better

## 2010-03-15 NOTE — Letter (Signed)
Summary: Well Care Determination Request Form  Well Care Determination Request Form   Imported By: Clydell Hakim 11/10/2009 11:45:19  _____________________________________________________________________  External Attachment:    Type:   Image     Comment:   External Document

## 2010-03-15 NOTE — Assessment & Plan Note (Signed)
Summary: bug bite-read notes!/[place]   Vital Signs:  Patient profile:   50 year old female Height:      65 inches Weight:      268 pounds BMI:     44.76 Temp:     98.7 degrees F Pulse rate:   91 / minute BP sitting:   185 / 116  Vitals Entered By: Ardeen Garland  MD (July 16, 2009 11:05 AM)  Primary Care Provider:  Milinda Antis MD   History of Present Illness: Here for bite on her abdomen, near umbilicus.  Noticed Sunday morning.  Was at the beach.  Hasn't itched, just "hurts". Can't characterize pain more than that.  Does hurt with palpatioin/pressure.  Feels like there is a "lump" uder there.  WAtches "animal planet" and thinks there is "something like a botch fly under there".   Also, in a wedding this weekend.  Has high heeled shoes.  Ankle still hurting and voltaren gel helps but nearly out.  Would like refill.   Habits & Providers  Alcohol-Tobacco-Diet     Alcohol drinks/day: <1     Tobacco Status: current     Tobacco Counseling: to quit use of tobacco products     Cigarette Packs/Day: <0.25  Exercise-Depression-Behavior     Drug Use: never     Seat Belt Use: always  Allergies: No Known Drug Allergies  Social History: Risk analyst Use:  always Drug Use:  never Packs/Day:  <0.25  Physical Exam  General:  NAD, Vital signs noted obese Abdomen:  soft, nontender, nondisteded Skin:  1.5x1 cm area of induration.  at approx 4-5 o'clock in relation to umbilicus.  2-3 cm area of surrounding erythema.  Mildly tender to palpation.  Non fluctuance.     Impression & Recommendations:  Problem # 1:  CARBUNCLE AND FURUNCLE OF TRUNK (ICD-680.2) Assessment New  Possibly ingrown hair, now infected, versus mrsa boil.  Will treat with warm soaks, doxycycline.  If worsens or develops a fever will return for further eval.  Nothign to I&D today.   Orders: [facility]- Est  Level 4 ([address])  Complete Medication List: 1)  Lotensin 40 Mg Tabs (Benazepril hcl) .[name].. 1 by mouth daily 2)   Hydrochlorothiazide 25 Mg Tabs (Hydrochlorothiazide) .[name].. 1 tab by mouth daily 3)  Flexeril 5 Mg Tabs (Cyclobenzaprine hcl) .[name].. 1 by mouth at bedtime 4)  Lexapro 10 Mg Tabs (Escitalopram oxalate) .[name].. 1 by mouth daily x 1 week then increase to 2 tablets daily 5)  Norco 5-325 Mg Tabs (Hydrocodone-acetaminophen) .[name].. 1 by mouth q 6hrs as needed pain 6)  Flonase 50 Mcg/act Susp (Fluticasone propionate) .[name].. 1 spray each nostril as needed 7)  Doxycycline Hyclate 100 Mg Caps (Doxycycline hyclate) .[name].. 1 by mouth two times a day for 14 days 8)  Voltaren 1 % Gel (Diclofenac sodium) .... Apply to foot 3-4 times daily as directed as needed for pain disp: 100g 9)  Fluconazole 150 Mg Tabs (Fluconazole) .[name].. 1 tab by mouth x 1 dose.  repeat in 3 days.  Patient Instructions: 1)  Use warm compresses on the area to help it heal.   2)  Be sure to take the antibiotic as directed and complete the full course, even if your spot heals completely before you finish it. 3)  If you get a yeast infection, you can use the diflucan. 4)  If the spot worsens instead of improves or you develop a fever, please return.  Prescriptions: FLUCONAZOLE 150 MG TABS (FLUCONAZOLE)  1 tab by mouth x 1 dose.  Repeat in 3 days.  #2 x 1   Entered and Authorized by:   Ardeen Garland  MD   Signed by:   Ardeen Garland  MD on 07/16/2009   Method used:   Print then Give to Patient   RxID:   [id number] NORCO 5-325 MG TABS (HYDROCODONE-ACETAMINOPHEN) 1 by mouth q 6hrs as needed pain  #60 x 0   Entered and Authorized by:   Ardeen Garland  MD   Signed by:   Ardeen Garland  MD on 07/16/2009   Method used:   Print then Give to Patient   RxID:   [id number] VOLTAREN 1 % GEL (DICLOFENAC SODIUM) apply to foot 3-4 times daily as directed as needed for pain disp: 100g  #1 x 2   Entered and Authorized by:   Ardeen Garland  MD   Signed by:   Ardeen Garland  MD on 07/16/2009   Method used:   Print then Give to Patient   RxID:    [id number] DOXYCYCLINE HYCLATE 100 MG CAPS (DOXYCYCLINE HYCLATE) 1 by mouth two times a day for 14 days  #28 x 0   Entered and Authorized by:   Ardeen Garland  MD   Signed by:   Ardeen Garland  MD on 07/16/2009   Method used:   Print then Give to Patient   RxID:   [id number]

## 2010-03-15 NOTE — Assessment & Plan Note (Signed)
Summary: dizziness, allergies/otits media?   Vital Signs:  Patient profile:   50 year old female Weight:      269.1 pounds Temp:     99 degrees F oral Pulse rate:   82 / minute Pulse (ortho):   76 / minute Pulse rhythm:   regular BP sitting:   164 / 105  (right arm) BP standing:   140 / 92 Cuff size:   large  Vitals Entered By: Loralee Pacas CMA (October 27, 2009 10:44 AM)  Serial Vital Signs/Assessments:  Time      Position  BP       Pulse  Resp  Temp     By 11:16 AM  Lying RA  155/92   73                    Tonya Barkley CMA 11:16 AM  Lying LA  123/85   67                    Tonya Barkley CMA 11:16 AM  Sitting   128/89   72                    Tonya Barkley CMA 11:16 AM  Standing  140/92   76                    Tonya Barkley CMA  CC: Dizziness, congestion Is Patient Diabetic? No   Primary Care Trany Chernick:  Milinda Antis MD  CC:  Dizziness and congestion.  History of Present Illness: Dizziness: Pt has been feeling dizzy for the last 3 days. 2 days ago she fell "tumbled"  in the hallway and hit her Rt side (did not hit her head). Pt has been holding onto the walls. She says that she feels better when she is lying down, the dizziness does not come on until she is walking. She is fine when she goes from lying to sitting and sitting to standing but it takes her a few minutes to feel steady enough to walk and then while she is walking she has to hold on to things. No ringing in the ears, no fever. PT has used afrin but didn't like it. She says flonase works better.   Congestion: Pt has been feeling like she has congestion in her nares and ears. She was treated for an ear infection recently and completed the course. She still feels like she has some fluid in her ears.   Habits & Providers  Alcohol-Tobacco-Diet     Alcohol drinks/day: <1     Alcohol type: rarely     Tobacco Status: current     Tobacco Counseling: to quit use of tobacco products     Cigarette Packs/Day:  <0.25  Current Medications (verified): 1)  Lotensin 40 Mg  Tabs (Benazepril Hcl) .[name].. 1 By Mouth Daily 2)  Hydrochlorothiazide 25 Mg Tabs (Hydrochlorothiazide) .[name].. 1 Tab By Mouth Daily 3)  Flexeril 5 Mg Tabs (Cyclobenzaprine Hcl) .[name].. 1 By Mouth At Bedtime 4)  Lexapro 10 Mg Tabs (Escitalopram Oxalate) .[name].. 1 By Mouth Daily X 1 Week Then Increase To 2 Tablets Daily 5)  Norco 5-325 Mg Tabs (Hydrocodone-Acetaminophen) .[name].. 1 By Mouth Q 6hrs As Needed Pain 6)  Flonase 50 Mcg/act Susp (Fluticasone Propionate) .[name].. 1 Spray Each Nostril As Needed 7)  Voltaren 1 % Gel (Diclofenac Sodium) .... Apply To Foot 3-4 Times Daily As Directed As Needed For Pain Disp: 100g 8)  Fluconazole 150 Mg Tabs (Fluconazole) .[name].. 1 Tab By Mouth X 1 Dose.  Repeat in 3 Days. 9)  Lidoderm 5 % Ptch (Lidocaine) .... Apply To Affected Area For No More Than 12 Hours A Day Disp: 1 Box 10)  Miralax  Powd (Polyethylene Glycol 3350) 11)  Ciprodex 0.3-0.1 % Susp (Ciprofloxacin-Dexamethasone) .... 4 Drops in Each Ear Two Times A Day X 7 Days For Ear Infection. 1 Bottle 12)  Wal-Zyr 10 Mg Tabs (Cetirizine Hcl) .... Take 1 Pill Every Evening For Allergies and Ear Fluid 13)  Meclizine Hcl 25 Mg Tabs (Meclizine Hcl) .... Take 1 Tab Every 8-12 Hours As Needed For Dizziness.  Allergies (verified): No Known Drug Allergies  Review of Systems        vitals reviewed and pertinent negatives and positives seen in HPI   Physical Exam  General:  Well-developed,well-nourished,in no acute distress; alert,appropriate and cooperative throughout examination Ears:  erythema in bilateral ear canals.  Nose:  large turbinates bilaterally Mouth:  Oral mucosa and oropharynx without lesions or exudates.  Teeth in good repair. Lungs:  Normal respiratory effort, chest expands symmetrically. Lungs are clear to auscultation, no crackles or wheezes. Heart:  Normal rate and regular rhythm. S1 and S2 normal without gallop, murmur, click, rub or other extra  sounds. Neurologic:  pt has no nystagmus on Halpike testing, pt has postural instability upon standing.  Psych:  Cognition and judgment appear intact. Alert and cooperative with normal attention span and concentration. No apparent delusions, illusions, hallucinations   Impression & Recommendations:  Problem # 1:  DIZZINESS (ICD-780.4) Assessment New Pt has some severe dizziness today. She is not orthostatic, Halpike maneuver is neg.  Plan to treat with Meclizine and Zyrtec. Will contact the patient later in the week to check on her.   Her updated medication list for this problem includes:    Wal-zyr 10 Mg Tabs (Cetirizine hcl) .[name]... Take 1 pill every evening for allergies and ear fluid    Meclizine Hcl 25 Mg Tabs (Meclizine hcl) .[name]... Take 1 tab every 8-12 hours as needed for dizziness.  Orders: Physical Therapy Referral (PT) [facility]- Est Level  3 ([address]56)  Problem # 2:  OTHER DISEASES OF NASAL CAVITY AND SINUSES (ICD-478.19) Assessment: Deteriorated Pt has what looks like an otitis externa and some allergies. Plan to treat with Ciprodex drops and Zyrtec. Pt may be having some of her dizziness from the congestion in her ears.   Orders: [facility]- Est Level  3 ([address])  Complete Medication List: 1)  Lotensin 40 Mg Tabs (Benazepril hcl) .[name].. 1 by mouth daily 2)  Hydrochlorothiazide 25 Mg Tabs (Hydrochlorothiazide) .[name].. 1 tab by mouth daily 3)  Flexeril 5 Mg Tabs (Cyclobenzaprine hcl) .[name].. 1 by mouth at bedtime 4)  Lexapro 10 Mg Tabs (Escitalopram oxalate) .[name].. 1 by mouth daily x 1 week then increase to 2 tablets daily 5)  Norco 5-325 Mg Tabs (Hydrocodone-acetaminophen) .[name].. 1 by mouth q 6hrs as needed pain 6)  Flonase 50 Mcg/act Susp (Fluticasone propionate) .[name].. 1 spray each nostril as needed 7)  Voltaren 1 % Gel (Diclofenac sodium) .... Apply to foot 3-4 times daily as directed as needed for pain disp: 100g 8)  Fluconazole 150 Mg Tabs (Fluconazole) .[name].. 1 tab by mouth x 1 dose.  repeat in 3  days. 9)  Lidoderm 5 % Ptch (Lidocaine) .... Apply to affected area for no more than 12 hours a day disp: 1 box 10)  Miralax Powd (Polyethylene glycol 3350) 11)  Ciprodex 0.3-0.1 % Susp (Ciprofloxacin-dexamethasone) .... 4 drops in each ear two times a day x 7 days for ear infection. 1 bottle 12)  Wal-zyr 10 Mg Tabs (Cetirizine hcl) .... Take 1 pill every evening for allergies and ear fluid 13)  Meclizine Hcl 25 Mg Tabs (Meclizine hcl) .... Take 1 tab every 8-12 hours as needed for dizziness.  Patient Instructions: 1)  I am sending in a medicine to help dry our your ears and a medicine to treat the inflammation and likely infection in your ears.  2)  Also, I am sending in a medicine for the dizziness.  3)  Be very careful with walking. If you need an assist device to help you walk we can arrange this.  Prescriptions: MECLIZINE HCL 25 MG TABS (MECLIZINE HCL) take 1 tab every 8-12 hours as needed for dizziness.  #90 x 3   Entered and Authorized by:   Jamie Brookes MD   Signed by:   Jamie Brookes MD on 10/27/2009   Method used:   Electronically to        [facility] [address] [phone]* (retail)       [address]       [address]       [place], [place]  [address]       Ph: [phone]       Fax: [phone]   RxID:   [id number] WAL-ZYR 10 MG TABS (CETIRIZINE HCL) take 1 pill every evening for allergies and ear fluid  #90 x 3   Entered and Authorized by:   Jamie Brookes MD   Signed by:   Jamie Brookes MD on 10/27/2009   Method used:   Electronically to        [facility] [address] [phone]* (retail)       [address]       [address]       [place], [place]  [address]       Ph: [phone]       Fax: [phone]   RxID:   [id number] CIPRODEX 0.3-0.1 % SUSP (CIPROFLOXACIN-DEXAMETHASONE) 4 drops in each ear two times a day x 7 days for ear infection. 1 bottle  #1 x 0   Entered and Authorized by:   Jamie Brookes MD   Signed by:   Jamie Brookes MD on 10/27/2009    Method used:   Electronically to        [facility] [address] [phone]* (retail)       [address]       [address]       [place], [place]  [address]       Ph: [phone]       Fax: [phone]   RxID:   [id number]

## 2010-03-17 NOTE — Miscellaneous (Signed)
  Clinical Lists Changes  Problems: Removed problem of SKIN RASH (ICD-782.1) Removed problem of DIZZINESS (ICD-780.4) Removed problem of CONSTIPATION (ICD-564.00) Removed problem of SEXUALLY TRANSMITTED DISEASE, EXPOSURE TO (ICD-V01.6) Removed problem of ANKLE PAIN, RIGHT (ICD-719.47) Removed problem of BONE SPUR (ICD-726.91) Observations: Added new observation of PAST MED HX: HTN Depression Tobacco Abuse  Obestiy Chronic ankle Pain- Bone Spur Pes Planus (02/08/2010 23:46)      Past Medical History:    HTN    Depression    Tobacco Abuse     Obestiy    Chronic ankle Pain- Bone Spur    Pes Planus

## 2010-11-08 LAB — POCT I-STAT, CHEM 8
BUN: 7
Chloride: 103
Creatinine, Ser: 0.8
Potassium: 4

## 2010-11-10 LAB — POCT URINALYSIS DIP (DEVICE)
Bilirubin Urine: NEGATIVE
Glucose, UA: NEGATIVE
Hgb urine dipstick: NEGATIVE
Nitrite: NEGATIVE
Specific Gravity, Urine: 1.025
Urobilinogen, UA: 0.2
pH: 6

## 2010-11-10 LAB — WET PREP, GENITAL
Trich, Wet Prep: NONE SEEN
WBC, Wet Prep HPF POC: NONE SEEN
Yeast Wet Prep HPF POC: NONE SEEN

## 2014-10-30 ENCOUNTER — Emergency Department (HOSPITAL_COMMUNITY)
Admission: EM | Admit: 2014-10-30 | Discharge: 2014-10-30 | Disposition: A | Discharge status: Home / Self Care | Payer: Medicare Other | Attending: Emergency Medicine | Admitting: Emergency Medicine

## 2014-10-30 ENCOUNTER — Emergency Department (HOSPITAL_COMMUNITY): Payer: Medicare Other

## 2014-10-30 ENCOUNTER — Encounter (HOSPITAL_COMMUNITY): Payer: Self-pay

## 2014-10-30 DIAGNOSIS — I10 Essential (primary) hypertension: Secondary | ICD-10-CM | POA: Diagnosis not present

## 2014-10-30 DIAGNOSIS — Z720 Tobacco use: Secondary | ICD-10-CM | POA: Diagnosis not present

## 2014-10-30 DIAGNOSIS — R079 Chest pain, unspecified: Secondary | ICD-10-CM | POA: Diagnosis present

## 2014-10-30 DIAGNOSIS — Z79899 Other long term (current) drug therapy: Secondary | ICD-10-CM | POA: Diagnosis not present

## 2014-10-30 DIAGNOSIS — R0789 Other chest pain: Secondary | ICD-10-CM | POA: Diagnosis not present

## 2014-10-30 DIAGNOSIS — Z7951 Long term (current) use of inhaled steroids: Secondary | ICD-10-CM | POA: Diagnosis not present

## 2014-10-30 DIAGNOSIS — Z7952 Long term (current) use of systemic steroids: Secondary | ICD-10-CM | POA: Diagnosis not present

## 2014-10-30 HISTORY — DX: Essential (primary) hypertension: I10

## 2014-10-30 LAB — CBC
HEMATOCRIT: 41.4 % (ref 36.0–46.0)
HEMOGLOBIN: 13.5 g/dL (ref 12.0–15.0)
MCH: 30.8 pg (ref 26.0–34.0)
MCHC: 32.6 g/dL (ref 30.0–36.0)
MCV: 94.5 fL (ref 78.0–100.0)
Platelets: 306 10*3/uL (ref 150–400)
RBC: 4.38 MIL/uL (ref 3.87–5.11)
RDW: 13.6 % (ref 11.5–15.5)
WBC: 6.4 10*3/uL (ref 4.0–10.5)

## 2014-10-30 LAB — BASIC METABOLIC PANEL
ANION GAP: 8 (ref 5–15)
BUN: 11 mg/dL (ref 6–20)
CALCIUM: 9.6 mg/dL (ref 8.9–10.3)
CHLORIDE: 103 mmol/L (ref 101–111)
CO2: 30 mmol/L (ref 22–32)
Creatinine, Ser: 0.72 mg/dL (ref 0.44–1.00)
GFR calc Af Amer: >60 mL/min (ref >60)
GFR calc non Af Amer: >60 mL/min (ref >60)
GLUCOSE: 100 mg/dL — AB (ref 65–99)
Potassium: 3.4 mmol/L — ABNORMAL LOW (ref 3.5–5.1)
Sodium: 141 mmol/L (ref 135–145)

## 2014-10-30 LAB — TROPONIN I: Troponin I: <0.03 ng/mL (ref <0.031)

## 2014-10-30 MED ORDER — METHOCARBAMOL 500 MG PO TABS: 500.0000 mg | ORAL_TABLET | Freq: Two times a day (BID) | ORAL | Status: DC

## 2014-10-30 NOTE — ED Notes (Signed)
Pt c/o chest pain starting this morning.  Pt states it feels like a tug.  When pt does counter pressure to chest and holds breath, it improves.  Pt denies cough.  Pt denies hx of reflux.  Pt denies any new physical activity.

## 2014-10-30 NOTE — ED Provider Notes (Signed)
CSN: [id number]     Arrival date & time 10/30/14  1303 History   First MD Initiated Contact with Patient 10/30/14 1623     Chief Complaint  Patient presents with  . Chest Pain     (Consider location/radiation/quality/duration/timing/severity/associated sxs/prior Treatment) HPI Comments: Patient presents to the ER for evaluation of chest pain. Patient has been experiencing intermittent "tugging sensation" in her right chest since this morning. Patient has not identified any factors that cause the pain. She reports that when she puts pressure over the right side of her chest, it improves. Pain is not continuous, occurs intermittently and lasts briefly. There is no associated shortness of breath. No trauma to the chest.  Patient is a 54 y.o. female presenting with chest pain.  Chest Pain   Past Medical History  Diagnosis Date  . Hypertension    Past Surgical History  Procedure Laterality Date  . Abdominal hysterectomy     No family history on file. Social History  Substance Use Topics  . Smoking status: Current Some Day Smoker  . Smokeless tobacco: Not on file  . Alcohol Use: No   OB History    No data available     Review of Systems  Cardiovascular: Positive for chest pain.  All other systems reviewed and are negative.     Allergies  Review of patient's allergies indicates no known allergies.  Home Medications   Prior to Admission medications   Medication Sig Start Date End Date Taking? Authorizing Provider  benazepril (LOTENSIN) 40 MG tablet Take 40 mg by mouth daily.     Yes Historical Provider, MD  cetirizine (WAL-ZYR) 10 MG tablet Take 10 mg by mouth daily as needed for allergies.    Yes Historical Provider, MD  ciprofloxacin-dexamethasone (CIPRODEX) otic suspension Place 4 drops into both ears 2 (two) times daily.     Yes Historical Provider, MD  cyclobenzaprine (FLEXERIL) 5 MG tablet Take 5 mg by mouth at bedtime.     Yes Historical Provider, MD  diclofenac  sodium (VOLTAREN) 1 % GEL apply to foot 3-4 times daily as directed as needed for pain disp: 100g    Yes Historical Provider, MD  escitalopram (LEXAPRO) 20 MG tablet Take 20 mg by mouth daily.     Yes Historical Provider, MD  fluconazole (DIFLUCAN) 150 MG tablet Take 150 mg by mouth once. Repeat in 3 days    Yes Historical Provider, MD  fluticasone (FLONASE) 50 MCG/ACT nasal spray 1 spray by Nasal route daily.     Yes Historical Provider, MD  hydrochlorothiazide 25 MG tablet Take 25 mg by mouth daily.     Yes Historical Provider, MD  HYDROcodone-acetaminophen (NORCO) 5-325 MG per tablet Take 1 tablet by mouth every 6 (six) hours as needed.     Yes Historical Provider, MD  hydrocortisone 1 % cream Apply topically 2 (two) times daily.     Yes Historical Provider, MD   BP 126/78 mmHg  Pulse 79  Temp(Src) 98.3 F (36.8 C) (Oral)  Resp 18  Ht 5' 5.5" (1.664 m)  Wt 232 lb (105.235 kg)  BMI 38.01 kg/m2  SpO2 100% Physical Exam  Constitutional: She is oriented to person, place, and time. She appears well-developed and well-nourished. No distress.  HENT:  Head: Normocephalic and atraumatic.  Right Ear: Hearing normal.  Left Ear: Hearing normal.  Nose: Nose normal.  Mouth/Throat: Oropharynx is clear and moist and mucous membranes are normal.  Eyes: Conjunctivae and EOM are normal. Pupils are  equal, round, and reactive to light.  Neck: Normal range of motion. Neck supple.  Cardiovascular: Regular rhythm, S1 normal and S2 normal.  Exam reveals no gallop and no friction rub.   No murmur heard. Pulmonary/Chest: Effort normal and breath sounds normal. No respiratory distress. She exhibits tenderness.    Abdominal: Soft. Normal appearance and bowel sounds are normal. There is no hepatosplenomegaly. There is no tenderness. There is no rebound, no guarding, no tenderness at McBurney's point and negative Murphy's sign. No hernia.  Musculoskeletal: Normal range of motion.  Neurological: She is alert  and oriented to person, place, and time. She has normal strength. No cranial nerve deficit or sensory deficit. Coordination normal. GCS eye subscore is 4. GCS verbal subscore is 5. GCS motor subscore is 6.  Skin: Skin is warm, dry and intact. No rash noted. No cyanosis.  Psychiatric: She has a normal mood and affect. Her speech is normal and behavior is normal. Thought content normal.  Nursing note and vitals reviewed.   ED Course  Procedures (including critical care time) Labs Review Labs Reviewed  BASIC METABOLIC PANEL - Abnormal; Notable for the following:    Potassium 3.4 (*)    Glucose, Bld 100 (*)    All other components within normal limits  CBC  TROPONIN I    Imaging Review Dg Chest 2 View  10/30/2014   CLINICAL DATA:  Generalized acute chest pain since earlier today  EXAM: CHEST  2 VIEW  COMPARISON:  07/10/2007  FINDINGS: The heart size and mediastinal contours are within normal limits. Both lungs are clear. The visualized skeletal structures are unremarkable.  IMPRESSION: No active cardiopulmonary disease.   Electronically Signed   By: Judie Petit.  Shick M.D.   On: 10/30/2014 14:21   I have personally reviewed and evaluated these images and lab results as part of my medical decision-making.   EKG Interpretation   Date/Time:  Friday October 30 2014 13:16:24 EDT Ventricular Rate:  76 PR Interval:  171 QRS Duration: 91 QT Interval:  375 QTC Calculation: 422 R Axis:   67 Text Interpretation:  Sinus rhythm Probable left atrial enlargement RSR'  in V1 or V2, probably normal variant No significant change since last  tracing Confirmed by POLLINA  MD, CHRISTOPHER [phone]) on 10/30/2014 4:27:12  PM      MDM   Final diagnoses:  None  chest wall pain  Presents to the emergency room for evaluation of chest pain. Patient has been experiencing intermittent pain in the right side of her chest since this morning. Patient's pain is extremely atypical. It occurs only for brief moments at  a time, intermittently and is not related to exertion. She does report that she works out, walks daily and never gets exertional chest pain. Patient has reproducible right-sided chest wall pain, this reproduces the pain she has been experiencing intermittently. Troponin negative. EKG unchanged from previous. Chest x-ray is negative. Patient does not require repeat troponins, is felt to be low risk and can be managed as an outpatient by primary care. Patient will be treated for muscle spasms of her chest wall.    Gilda Crease, MD 10/30/14 [phone]

## 2014-10-30 NOTE — Discharge Instructions (Signed)
Chest Wall Pain °Chest wall pain is pain in or around the bones and muscles of your chest. It may take up to 6 weeks to get better. It may take longer if you must stay physically active in your work and activities.  °CAUSES  °Chest wall pain may happen on its own. However, it may be caused by: °· A viral illness like the flu. °· Injury. °· Coughing. °· Exercise. °· Arthritis. °· Fibromyalgia. °· Shingles. °HOME CARE INSTRUCTIONS  °· Avoid overtiring physical activity. Try not to strain or perform activities that cause pain. This includes any activities using your chest or your abdominal and side muscles, especially if heavy weights are used. °· Put ice on the sore area. °¨ Put ice in a plastic bag. °¨ Place a towel between your skin and the bag. °¨ Leave the ice on for 15-20 minutes per hour while awake for the first 2 days. °· Only take over-the-counter or prescription medicines for pain, discomfort, or fever as directed by your caregiver. °SEEK IMMEDIATE MEDICAL CARE IF:  °· Your pain increases, or you are very uncomfortable. °· You have a fever. °· Your chest pain becomes worse. °· You have new, unexplained symptoms. °· You have nausea or vomiting. °· You feel sweaty or lightheaded. °· You have a cough with phlegm (sputum), or you cough up blood. °MAKE SURE YOU:  °· Understand these instructions. °· Will watch your condition. °· Will get help right away if you are not doing well or get worse. °Document Released: 01/30/2005 Document Revised: 04/24/2011 Document Reviewed: 09/26/2010 °[employer]® Patient Information ©2015 [employer]. This information is not intended to replace advice given to you by your health care provider. Make sure you discuss any questions you have with your health care provider. ° °Muscle Cramps and Spasms °Muscle cramps and spasms occur when a muscle or muscles tighten and you have no control over this tightening (involuntary muscle contraction). They are a common problem and can develop in  any muscle. The most common place is in the calf muscles of the leg. Both muscle cramps and muscle spasms are involuntary muscle contractions, but they also have differences:  °Muscle cramps are sporadic and painful. They may last a few seconds to a quarter of an hour. Muscle cramps are often more forceful and last longer than muscle spasms. °Muscle spasms may or may not be painful. They may also last just a few seconds or much longer. °CAUSES  °It is uncommon for cramps or spasms to be due to a serious underlying problem. In many cases, the cause of cramps or spasms is unknown. Some common causes are:  °Overexertion.   °Overuse from repetitive motions (doing the same thing over and over).   °Remaining in a certain position for a long period of time.   °Improper preparation, form, or technique while performing a sport or activity.   °Dehydration.   °Injury.   °Side effects of some medicines.   °Abnormally low levels of the salts and ions in your blood (electrolytes), especially potassium and calcium. This could happen if you are taking water pills (diuretics) or you are pregnant.   °Some underlying medical problems can make it more likely to develop cramps or spasms. These include, but are not limited to:  °Diabetes.   °Parkinson disease.   °Hormone disorders, such as thyroid problems.   °Alcohol abuse.   °Diseases specific to muscles, joints, and bones.   °Blood vessel disease where not enough blood is getting to the muscles.   °HOME CARE INSTRUCTIONS  °Stay well hydrated. Drink enough water   and fluids to keep your urine clear or pale yellow. °It may be helpful to massage, stretch, and relax the affected muscle. °For tight or tense muscles, use a warm towel, heating pad, or hot shower water directed to the affected area. °If you are sore or have pain after a cramp or spasm, applying ice to the affected area may relieve discomfort. °Put ice in a plastic bag. °Place a towel between your skin and the bag. °Leave the  ice on for 15-20 minutes, 03-04 times a day. °Medicines used to treat a known cause of cramps or spasms may help reduce their frequency or severity. Only take over-the-counter or prescription medicines as directed by your caregiver. °SEEK MEDICAL CARE IF:  °Your cramps or spasms get more severe, more frequent, or do not improve over time.  °MAKE SURE YOU:  °Understand these instructions. °Will watch your condition. °Will get help right away if you are not doing well or get worse. °Document Released: 07/22/2001 Document Revised: 05/27/2012 Document Reviewed: 01/17/2012 °[employer]® Patient Information ©2015 [employer]. This information is not intended to replace advice given to you by your health care provider. Make sure you discuss any questions you have with your health care provider. ° °

## 2014-12-04 ENCOUNTER — Encounter (HOSPITAL_COMMUNITY): Payer: Self-pay | Admitting: *Deleted

## 2014-12-04 ENCOUNTER — Emergency Department (HOSPITAL_COMMUNITY)
Admission: EM | Admit: 2014-12-04 | Discharge: 2014-12-04 | Disposition: A | Discharge status: Home / Self Care | Payer: No Typology Code available for payment source | Attending: Emergency Medicine | Admitting: Emergency Medicine

## 2014-12-04 DIAGNOSIS — I10 Essential (primary) hypertension: Secondary | ICD-10-CM | POA: Insufficient documentation

## 2014-12-04 DIAGNOSIS — G8929 Other chronic pain: Secondary | ICD-10-CM | POA: Diagnosis not present

## 2014-12-04 DIAGNOSIS — Z720 Tobacco use: Secondary | ICD-10-CM | POA: Insufficient documentation

## 2014-12-04 DIAGNOSIS — Z792 Long term (current) use of antibiotics: Secondary | ICD-10-CM | POA: Diagnosis not present

## 2014-12-04 DIAGNOSIS — Z79899 Other long term (current) drug therapy: Secondary | ICD-10-CM | POA: Insufficient documentation

## 2014-12-04 DIAGNOSIS — M545 Low back pain, unspecified: Secondary | ICD-10-CM

## 2014-12-04 MED ORDER — MELOXICAM 15 MG PO TABS: 15.0000 mg | ORAL_TABLET | Freq: Every day | ORAL | Status: DC

## 2014-12-04 MED ORDER — METHOCARBAMOL 500 MG PO TABS: 500.0000 mg | ORAL_TABLET | Freq: Two times a day (BID) | ORAL | Status: DC

## 2014-12-04 NOTE — Discharge Instructions (Signed)
1. Medications: robaxin, mobic, usual home medications 2. Treatment: rest, drink plenty of fluids, gentle stretching as discussed, alternate ice and heat 3. Follow Up: Please followup with your primary doctor in 3 days for discussion of your diagnoses and further evaluation after today's visit; if you do not have a primary care doctor use the resource guide provided to find one;  Return to the ER for worsening back pain, difficulty walking, loss of bowel or bladder control or other concerning symptoms    Back Exercises The following exercises strengthen the muscles that help to support the back. They also help to keep the lower back flexible. Doing these exercises can help to prevent back pain or lessen existing pain. If you have back pain or discomfort, try doing these exercises 2-3 times each day or as told by your health care provider. When the pain goes away, do them once each day, but increase the number of times that you repeat the steps for each exercise (do more repetitions). If you do not have back pain or discomfort, do these exercises once each day or as told by your health care provider. EXERCISES Single Knee to Chest Repeat these steps 3-5 times for each leg: 1. Lie on your back on a firm bed or the floor with your legs extended. 2. Bring one knee to your chest. Your other leg should stay extended and in contact with the floor. 3. Hold your knee in place by grabbing your knee or thigh. 4. Pull on your knee until you feel a gentle stretch in your lower back. 5. Hold the stretch for 10-30 seconds. 6. Slowly release and straighten your leg. Pelvic Tilt Repeat these steps 5-10 times: 1. Lie on your back on a firm bed or the floor with your legs extended. 2. Bend your knees so they are pointing toward the ceiling and your feet are flat on the floor. 3. Tighten your lower abdominal muscles to press your lower back against the floor. This motion will tilt your pelvis so your tailbone  points up toward the ceiling instead of pointing to your feet or the floor. 4. With gentle tension and even breathing, hold this position for 5-10 seconds. Cat-Cow Repeat these steps until your lower back becomes more flexible: 1. Get into a hands-and-knees position on a firm surface. Keep your hands under your shoulders, and keep your knees under your hips. You may place padding under your knees for comfort. 2. Let your head hang down, and point your tailbone toward the floor so your lower back becomes rounded like the back of a cat. 3. Hold this position for 5 seconds. 4. Slowly lift your head and point your tailbone up toward the ceiling so your back forms a sagging arch like the back of a cow. 5. Hold this position for 5 seconds. Press-Ups Repeat these steps 5-10 times: 1. Lie on your abdomen (face-down) on the floor. 2. Place your palms near your head, about shoulder-width apart. 3. While you keep your back as relaxed as possible and keep your hips on the floor, slowly straighten your arms to raise the top half of your body and lift your shoulders. Do not use your back muscles to raise your upper torso. You may adjust the placement of your hands to make yourself more comfortable. 4. Hold this position for 5 seconds while you keep your back relaxed. 5. Slowly return to lying flat on the floor. Bridges Repeat these steps 10 times: 1. Lie on your back  on a firm surface. °2. Bend your knees so they are pointing toward the ceiling and your feet are flat on the floor. °3. Tighten your buttocks muscles and lift your buttocks off of the floor until your waist is at almost the same height as your knees. You should feel the muscles working in your buttocks and the back of your thighs. If you do not feel these muscles, slide your feet 1-2 inches farther away from your buttocks. °4. Hold this position for 3-5 seconds. °5. Slowly lower your hips to the starting position, and allow your buttocks muscles to  relax completely. °If this exercise is too easy, try doing it with your arms crossed over your chest. °Abdominal Crunches °Repeat these steps 5-10 times: °1. Lie on your back on a firm bed or the floor with your legs extended. °2. Bend your knees so they are pointing toward the ceiling and your feet are flat on the floor. °3. Cross your arms over your chest. °4. Tip your chin slightly toward your chest without bending your neck. °5. Tighten your abdominal muscles and slowly raise your trunk (torso) high enough to lift your shoulder blades a tiny bit off of the floor. Avoid raising your torso higher than that, because it can put too much stress on your low back and it does not help to strengthen your abdominal muscles. °6. Slowly return to your starting position. °Back Lifts °Repeat these steps 5-10 times: °1. Lie on your abdomen (face-down) with your arms at your sides, and rest your forehead on the floor. °2. Tighten the muscles in your legs and your buttocks. °3. Slowly lift your chest off of the floor while you keep your hips pressed to the floor. Keep the back of your head in line with the curve in your back. Your eyes should be looking at the floor. °4. Hold this position for 3-5 seconds. °5. Slowly return to your starting position. °SEEK MEDICAL CARE IF: °· Your back pain or discomfort gets much worse when you do an exercise. °· Your back pain or discomfort does not lessen within 2 hours after you exercise. °If you have any of these problems, stop doing these exercises right away. Do not do them again unless your health care provider says that you can. °SEEK IMMEDIATE MEDICAL CARE IF: °· You develop sudden, severe back pain. If this happens, stop doing the exercises right away. Do not do them again unless your health care provider says that you can. °  °This information is not intended to replace advice given to you by your health care provider. Make sure you discuss any questions you have with your health  care provider. °  °Document Released: 03/09/2004 Document Revised: 10/21/2014 Document Reviewed: 03/26/2014 °Elsevier Interactive Patient Education ©2016 Elsevier Inc. ° ° ° °Emergency Department Resource Guide °1) Find a Doctor and Pay Out of Pocket °Although you won't have to find out who is covered by your insurance plan, it is a good idea to ask around and get recommendations. You will then need to call the office and see if the doctor you have chosen will accept you as a new patient and what types of options they offer for patients who are self-pay. Some doctors offer discounts or will set up payment plans for their patients who do not have insurance, but you will need to ask so you aren't surprised when you get to your appointment. ° °2) Contact Your Local Health Department °Not all health departments have doctors   that can see patients for sick visits, but many do, so it is worth a call to see if yours does. If you don't know where your local health department is, you can check in your phone book. The CDC also has a tool to help you locate your state's health department, and many state websites also have listings of all of their local health departments.  3) Find a [facility] If your illness is not likely to be very severe or complicated, you may want to try a walk in clinic. These are popping up all over the country in pharmacies, drugstores, and shopping centers. They're usually staffed by nurse practitioners or physician assistants that have been trained to treat common illnesses and complaints. They're usually fairly quick and inexpensive. However, if you have serious medical issues or chronic medical problems, these are probably not your best option.  No Primary Care Doctor: - Call Health Connect at  [phone] - they can help you locate a primary care doctor that  accepts your insurance, provides certain services, etc. - Physician Referral Service- [phone]  Chronic Pain  Problems: Organization         Address  Phone   Notes  [facility]  [phone] Patients need to be referred by their primary care doctor.   Medication Assistance: Organization         Address  Phone   Notes  [facility] Medication [facility] [address] [place], [place] [address] [phone] --Must be a resident of [facility] -- Must have NO insurance coverage whatsoever (no Medicaid/ Medicare, etc.) -- The pt. MUST have a primary care doctor that directs their care regularly and follows them in the community   [facility]  [phone]   [employer]  [phone]    Agencies that provide inexpensive medical care: Organization         Address  Phone   Notes  [place]  [phone]   [facility]    [phone]   [facility] [place], [place] [address] [phone]   [place] [address], [place] [phone]   [facility]    [phone]   [facility]    [phone]   [place] and [address], [place] Phone:  [phone], Fax:  [phone] Hours of Operation:  9 am - 6 pm, M-F.  Also accepts Medicaid/Medicare and self-pay.  [facility] for [address], [address], [place] Phone: [phone], Fax: [phone]. Hours of Operation:  8:30 am - 5:30 pm, M-F.  Also accepts Medicaid and self-pay.  [facility] [place] [address] [place] Phone: [phone]   [place], [place], [place] [phone], Ext. [phone] Mondays & Thursdays: 7-9 AM.  First 15 patients are seen on a first come, first serve basis.    [place] Providers:  Organization         Address  Phone   Notes  [facility] [address], [place] [phone] Also  accepts self-pay patients.  [place], [place]  [phone]   [place], [address], [place] [phone]   [facility] [address] [place] [address], [place] [phone]) [phone]   °Veita Bland [address], [place]  ° ([phone] Only accepts [place] Access Medicaid patients after they have their name applied to their card.  ° °Self-Pay (no insurance) in [place]: ° °Organization         Address  Phone   Notes  °Sickle Cell Patients, [facility] [address], [place] [phone]   °[facility] [address], [place] ([phone]) [phone]   °[facility] ° [address], [place] ([phone]) [phone]   °[facility]/Dr. Osei-Bonsu ° [address] [place] [address], [place] or [address], [place] [phone] Phone number for both [place] and [place] locations is the same.  °[facility] [address], [place] ([phone]) [phone]   °[facility] [address] [place] [address], [place] or [address] ([phone]) [phone] °([phone]) [phone]   °[facility] [address], [place] ([phone]) [phone], phone; ([phone], fax Sees patients 1st and 3rd Saturday of every month.  Must not qualify for public or private insurance (i.e. Medicaid, Medicare, [place] Health Choice, Veterans' Benefits) • Household income should be no more than 200% of the poverty level •The clinic cannot treat you if you are pregnant or think you are pregnant • Sexually transmitted diseases are not treated at the clinic.  ° ° °Dental Care: °Organization         Address  Phone  Notes  °[facility] [facility] [address], [place] [phone]) [phone] Accepts children up to age 21 who are enrolled in Medicaid or [place] Health Choice; pregnant  women with a Medicaid card; and children who have applied for Medicaid or [place] Health Choice, but were declined, whose parents can pay a reduced fee at time of service.  °[place] [place]  [address], [place] ([phone]) [phone] Accepts children up to age 21 who are enrolled in Medicaid or [place] Health Choice; pregnant women with a Medicaid card; and children who have applied for Medicaid or  Health Choice, but were declined, whose parents can pay a reduced fee at time of service.  °[facility] ° [address], [place] ([phone]) [phone] Patients are seen by appointment only. Walk-ins are not accepted. [facility] will see patients 18 years of age and older. °Monday - Tuesday (8am-5pm) °Most Wednesdays (8:30-5pm) °$30 per visit, cash only  °[facility] ° [address], [place] [phone]) [phone] Patients are seen by appointment only. Walk-ins are not accepted. [facility] will see patients 18 years of age and older. °One Wednesday Evening (Monthly: Volunteer Based).  $30 per visit, cash only  °[facility]  ([phone] for adults; Children under age 4, call Graduate Pediatric Dentistry at [phone]. Children aged 4-14, please call [phone] to request a pediatric application. ° Dental services are provided in all areas of dental care including fillings, crowns and bridges, complete and partial dentures, implants, gum treatment, root canals, and extractions. Preventive care is also provided. Treatment is provided to both adults and children. °Patients are selected via a lottery and there is often a waiting list. °  °[facility] [address], °[place] ° ([phone]) [phone] [email or web address] °  °[facility] [address], [place], [place] ([phone], Ext. [phone] Second and Fourth Thursday of each month, opens at 6:30 AM; Clinic ends at 9 AM.  Patients are  seen on a first-come first-served basis, and a limited number are seen during each clinic.  ° °[facility] ° [address], [place], [place] ([phone]) [phone]   Eligibility Requirements °You must have lived in [place], [place], or [place] counties for at least the last three months. °  You cannot be eligible for state or federal sponsored healthcare insurance, including Veterans Administration, Medicaid, or Medicare. °  You generally cannot be eligible for healthcare insurance through your employer.  °  How to apply: °Eligibility screenings are held every Tuesday and Wednesday afternoon from 1:00 pm until 4:00 pm. You do not need an appointment for the interview!  °[facility] [address], [place], [place] [phone]   °[facility]  336-342-8273   °[facility]  [phone]   °[facility]  336-570-6415   ° °Behavioral Health Resources in the Community: °Intensive Outpatient Programs °Organization         Address  Phone  Notes  °[facility] [address], [place], [place] [phone]   °[facility] [address], [place], [place] [phone]   °[facility] [address], [place], [place] ° 336-882-2125   °[facility] [address],  °[place], [place] 1-800-853-5163 or 336-641-4981   °Substance Abuse Resources °Organization         Address  Phone  Notes  °[facility]  336-882-2125   °[facility]  336-784-9470   °[facility]  336-285-9073   °[facility]  336-845-3988   °Residential & Outpatient Substance Abuse Program  1-800-659-3381   °Psychological Services °Organization         Address  Phone  Notes  °[facility]  336- [phone]   °[facility]  336- 378-7881   °[facility] [address], [place] 1-800-853-5163 or 336-641-4981   ° °Mobile Crisis  Teams °Organization         Address  Phone  Notes  °[facility], Mobile Crisis Care Unit  1-877-626-1772   °Assertive °[facility] ° [address] [place], [place] 336-834-9664   °Sharon DeEsch [address] °[place] 336-554-5454   ° °Self-Help/Support Groups °Organization         Address  Phone             Notes  °[facility] [place] - variety of support groups  336- 373-1402 Call for more information  °Narcotics Anonymous (NA), [facility] [address], °[place]  2 meetings at this location  ° °Residential Treatment Programs °Organization         Address  Phone  Notes  °[facility] [address],    °[place]  1-866-801-8205   °[facility] ° [address], [place], [place] 704-293-8524   °[facility] [address], [place] 336-845-3988 Admissions: 8am-3pm M-F  °[facility] [address]    °[place], [place] 336-841-1104   °[facility] [address], [place], [employer] 336-379-7146   °[facility] [address]  °[place],  336-285-9073   °[facility] - Intensive Outpatient [address], [place],  336-852-3033   °  [facility] ([facility] [address]  [place], [place] 1-779-522-0898 or [phone]   [facility] [address] [place], [place] Accepts Medicaid  [facility] [address],  [place] 1-224-758-0742 Substance Abuse/Addiction Treatment   [facility] Organization         Address  Phone  Notes  [facility]  (720) [phone]   Domenic Schwab, PhD [address] [place], [place]   985-625-7292 or [phone]   [place], [place] [phone]   [address], [place], [place] [phone]  Insurance/Medicaid/sponsorship through [facility] [address]                                    [place], [place] [phone] [place] [address], [place] [phone]    Dr. Adele Schilder  [phone]   [facility] Dept. 1) [address], [place] 2) [place] 3)  [address], [place] [phone]  [phone]   [place] [phone] or [phone] (After Hours)
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
## 2015-02-14 DIAGNOSIS — Z8709 Personal history of other diseases of the respiratory system: Secondary | ICD-10-CM | POA: Insufficient documentation

## 2015-02-14 HISTORY — DX: Personal history of other diseases of the respiratory system: Z87.09

## 2016-03-27 DIAGNOSIS — R3129 Other microscopic hematuria: Secondary | ICD-10-CM | POA: Diagnosis not present

## 2016-03-27 DIAGNOSIS — R8299 Other abnormal findings in urine: Secondary | ICD-10-CM | POA: Diagnosis not present

## 2016-03-28 DIAGNOSIS — R3129 Other microscopic hematuria: Secondary | ICD-10-CM | POA: Diagnosis not present

## 2016-03-28 DIAGNOSIS — R8299 Other abnormal findings in urine: Secondary | ICD-10-CM | POA: Diagnosis not present

## 2016-08-27 DIAGNOSIS — H9202 Otalgia, left ear: Secondary | ICD-10-CM | POA: Diagnosis not present

## 2016-08-27 DIAGNOSIS — J351 Hypertrophy of tonsils: Secondary | ICD-10-CM | POA: Diagnosis not present

## 2016-08-27 DIAGNOSIS — J45909 Unspecified asthma, uncomplicated: Secondary | ICD-10-CM | POA: Diagnosis not present

## 2016-08-27 DIAGNOSIS — I10 Essential (primary) hypertension: Secondary | ICD-10-CM | POA: Diagnosis not present

## 2016-08-27 DIAGNOSIS — J029 Acute pharyngitis, unspecified: Secondary | ICD-10-CM | POA: Diagnosis not present

## 2016-08-27 DIAGNOSIS — H6592 Unspecified nonsuppurative otitis media, left ear: Secondary | ICD-10-CM | POA: Diagnosis not present

## 2016-08-27 DIAGNOSIS — R590 Localized enlarged lymph nodes: Secondary | ICD-10-CM | POA: Diagnosis not present

## 2016-09-22 IMAGING — CR DG CHEST 2V
2 series · 2 of 2 positions shown · non-contrast
Comparison: 07/10/2007

CLINICAL DATA: Generalized acute chest pain since earlier today

EXAM:
CHEST  2 VIEW

[w chest pa]
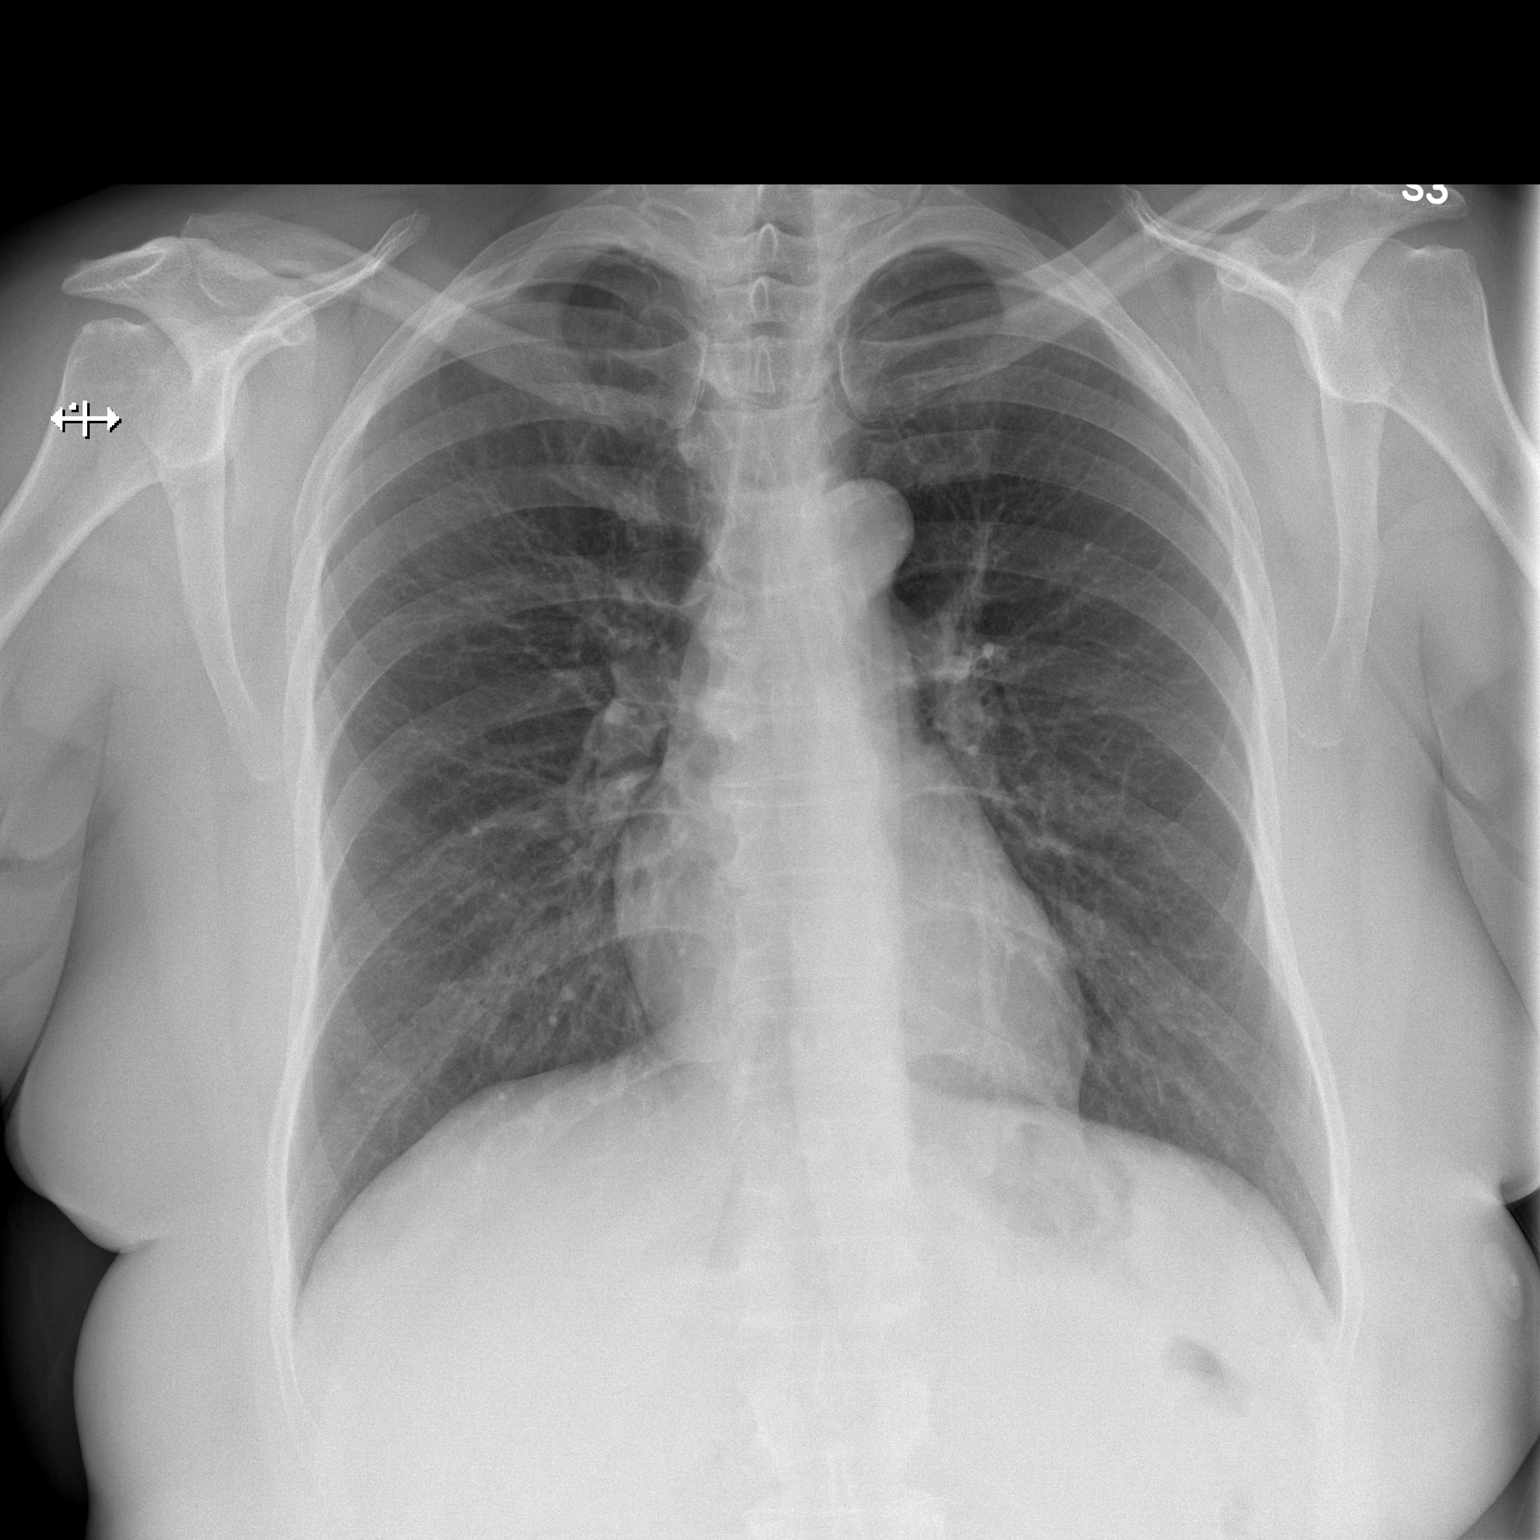

[w chest lat]
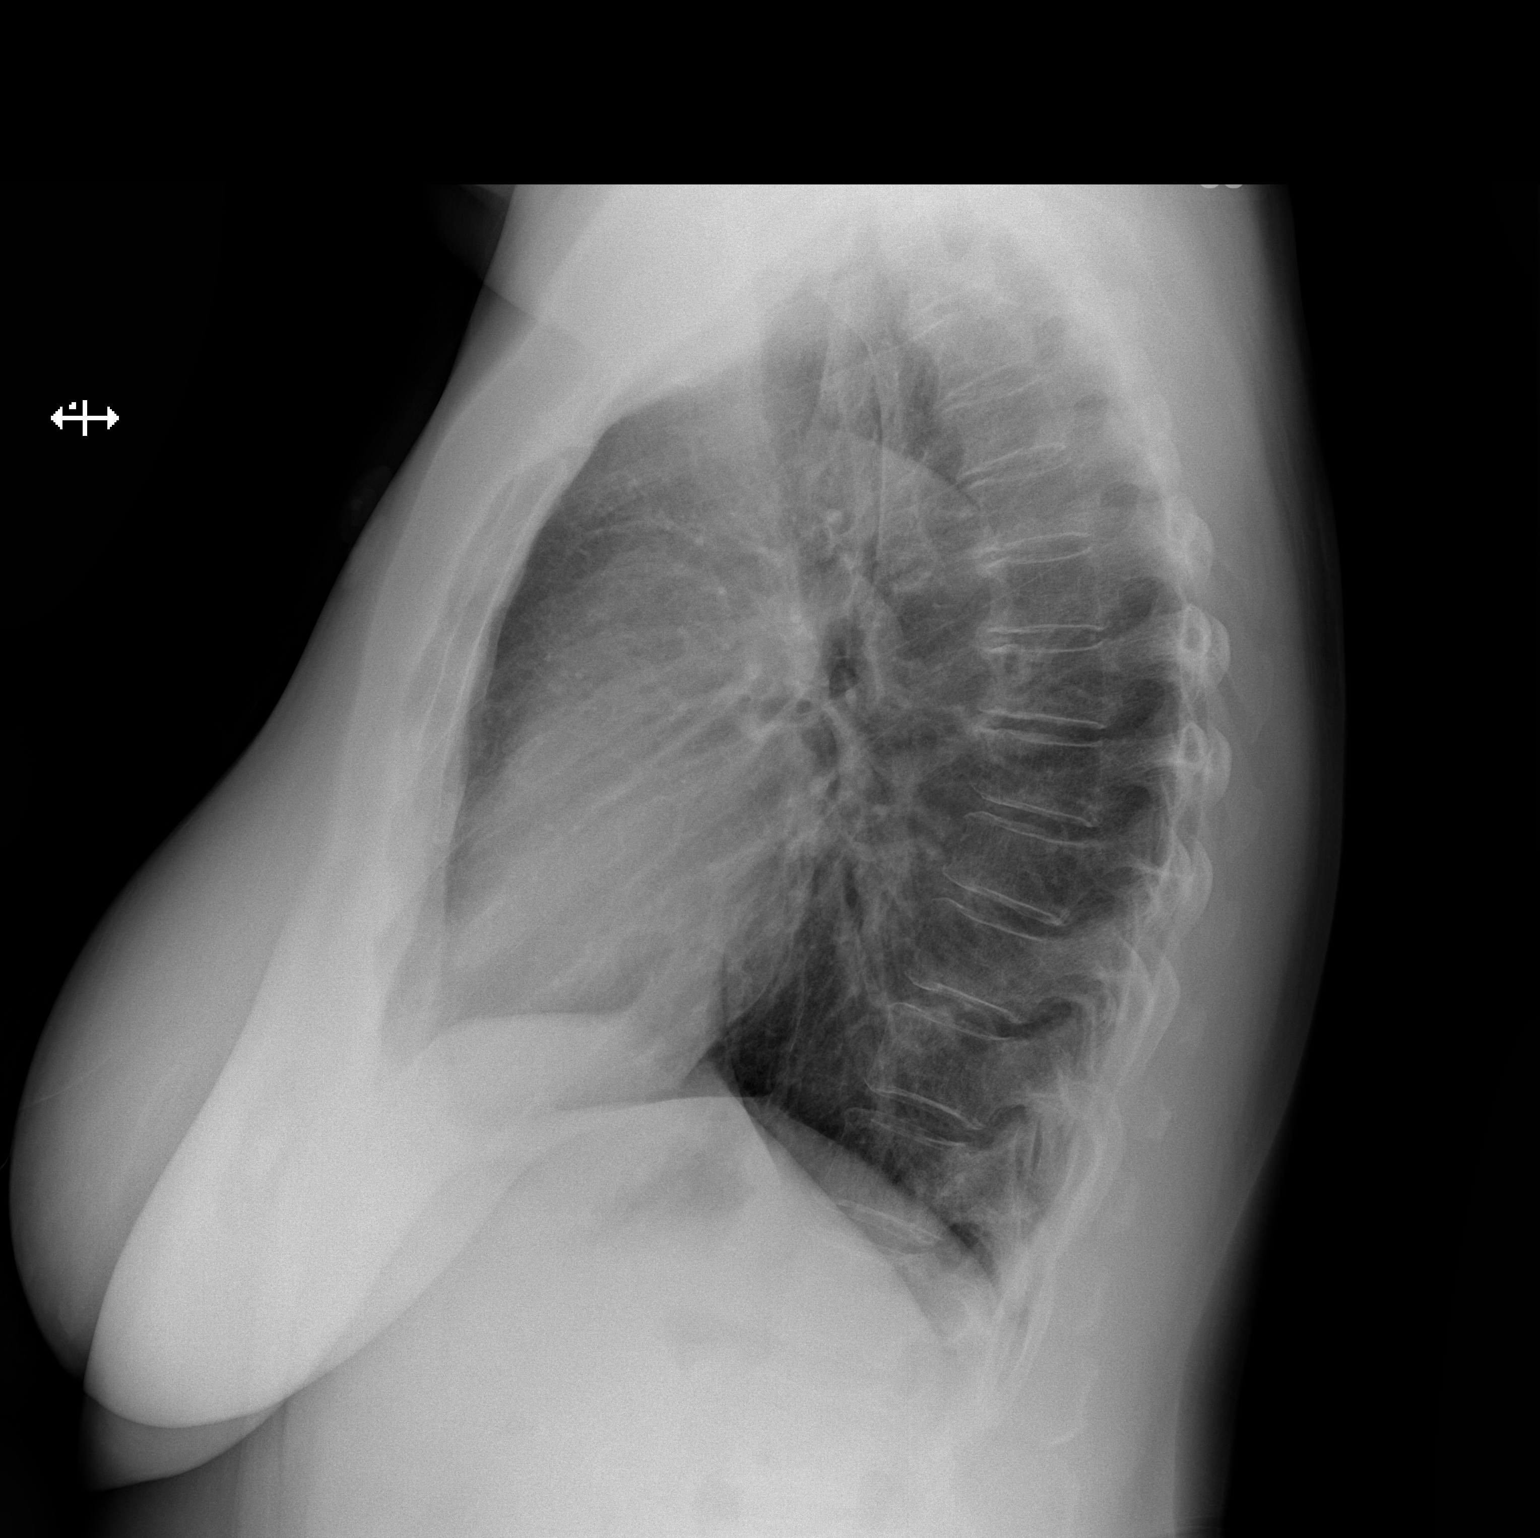

[2 of 2 positions shown; findings below may reference images not displayed]

FINDINGS: The heart size and mediastinal contours are within normal limits.
Both lungs are clear. The visualized skeletal structures are
unremarkable.
IMPRESSION: No active cardiopulmonary disease.

## 2017-01-27 DIAGNOSIS — Z87891 Personal history of nicotine dependence: Secondary | ICD-10-CM | POA: Diagnosis not present

## 2017-01-27 DIAGNOSIS — Z743 Need for continuous supervision: Secondary | ICD-10-CM | POA: Diagnosis not present

## 2017-01-27 DIAGNOSIS — R42 Dizziness and giddiness: Secondary | ICD-10-CM | POA: Diagnosis not present

## 2017-01-27 DIAGNOSIS — R0689 Other abnormalities of breathing: Secondary | ICD-10-CM | POA: Diagnosis not present

## 2017-01-27 DIAGNOSIS — R7989 Other specified abnormal findings of blood chemistry: Secondary | ICD-10-CM | POA: Diagnosis not present

## 2017-01-27 DIAGNOSIS — R079 Chest pain, unspecified: Secondary | ICD-10-CM | POA: Diagnosis not present

## 2017-01-27 DIAGNOSIS — R002 Palpitations: Secondary | ICD-10-CM | POA: Diagnosis not present

## 2017-01-27 DIAGNOSIS — R0600 Dyspnea, unspecified: Secondary | ICD-10-CM | POA: Diagnosis not present

## 2017-01-27 DIAGNOSIS — I517 Cardiomegaly: Secondary | ICD-10-CM | POA: Diagnosis not present

## 2017-01-27 DIAGNOSIS — R0789 Other chest pain: Secondary | ICD-10-CM | POA: Diagnosis not present

## 2018-04-22 DIAGNOSIS — M7731 Calcaneal spur, right foot: Secondary | ICD-10-CM | POA: Diagnosis not present

## 2018-04-22 DIAGNOSIS — S82854A Nondisplaced trimalleolar fracture of right lower leg, initial encounter for closed fracture: Secondary | ICD-10-CM | POA: Diagnosis not present

## 2018-04-22 DIAGNOSIS — M25571 Pain in right ankle and joints of right foot: Secondary | ICD-10-CM | POA: Diagnosis not present

## 2018-04-22 DIAGNOSIS — S8262XA Displaced fracture of lateral malleolus of left fibula, initial encounter for closed fracture: Secondary | ICD-10-CM | POA: Diagnosis not present

## 2018-04-22 DIAGNOSIS — Y9389 Activity, other specified: Secondary | ICD-10-CM | POA: Diagnosis not present

## 2018-04-22 DIAGNOSIS — Z01818 Encounter for other preprocedural examination: Secondary | ICD-10-CM | POA: Diagnosis not present

## 2018-04-22 DIAGNOSIS — I700 Atherosclerosis of aorta: Secondary | ICD-10-CM | POA: Diagnosis not present

## 2018-04-22 DIAGNOSIS — Z743 Need for continuous supervision: Secondary | ICD-10-CM | POA: Diagnosis not present

## 2018-04-22 DIAGNOSIS — Y9289 Other specified places as the place of occurrence of the external cause: Secondary | ICD-10-CM | POA: Diagnosis not present

## 2018-04-22 DIAGNOSIS — S8251XA Displaced fracture of medial malleolus of right tibia, initial encounter for closed fracture: Secondary | ICD-10-CM | POA: Diagnosis not present

## 2018-04-22 DIAGNOSIS — S82851A Displaced trimalleolar fracture of right lower leg, initial encounter for closed fracture: Secondary | ICD-10-CM | POA: Diagnosis not present

## 2018-04-22 DIAGNOSIS — S82832A Other fracture of upper and lower end of left fibula, initial encounter for closed fracture: Secondary | ICD-10-CM | POA: Diagnosis not present

## 2018-04-22 DIAGNOSIS — W108XXA Fall (on) (from) other stairs and steps, initial encounter: Secondary | ICD-10-CM | POA: Diagnosis not present

## 2018-04-22 DIAGNOSIS — M25572 Pain in left ankle and joints of left foot: Secondary | ICD-10-CM | POA: Diagnosis not present

## 2018-04-22 DIAGNOSIS — F0390 Unspecified dementia without behavioral disturbance: Secondary | ICD-10-CM | POA: Diagnosis not present

## 2018-04-22 DIAGNOSIS — T1490XA Injury, unspecified, initial encounter: Secondary | ICD-10-CM | POA: Diagnosis not present

## 2018-04-23 DIAGNOSIS — Z9181 History of falling: Secondary | ICD-10-CM | POA: Diagnosis not present

## 2018-04-23 DIAGNOSIS — S8990XA Unspecified injury of unspecified lower leg, initial encounter: Secondary | ICD-10-CM | POA: Diagnosis not present

## 2018-04-23 DIAGNOSIS — S93431A Sprain of tibiofibular ligament of right ankle, initial encounter: Secondary | ICD-10-CM | POA: Diagnosis present

## 2018-04-23 DIAGNOSIS — R531 Weakness: Secondary | ICD-10-CM | POA: Diagnosis not present

## 2018-04-23 DIAGNOSIS — Z87891 Personal history of nicotine dependence: Secondary | ICD-10-CM | POA: Diagnosis not present

## 2018-04-23 DIAGNOSIS — S82851A Displaced trimalleolar fracture of right lower leg, initial encounter for closed fracture: Secondary | ICD-10-CM | POA: Diagnosis not present

## 2018-04-23 DIAGNOSIS — Z743 Need for continuous supervision: Secondary | ICD-10-CM | POA: Diagnosis not present

## 2018-04-23 DIAGNOSIS — S82892A Other fracture of left lower leg, initial encounter for closed fracture: Secondary | ICD-10-CM | POA: Diagnosis not present

## 2018-04-23 DIAGNOSIS — S8291XB Unspecified fracture of right lower leg, initial encounter for open fracture type I or II: Secondary | ICD-10-CM | POA: Diagnosis not present

## 2018-04-23 DIAGNOSIS — S82851D Displaced trimalleolar fracture of right lower leg, subsequent encounter for closed fracture with routine healing: Secondary | ICD-10-CM | POA: Diagnosis not present

## 2018-04-23 DIAGNOSIS — I10 Essential (primary) hypertension: Secondary | ICD-10-CM | POA: Diagnosis not present

## 2018-04-23 DIAGNOSIS — F0390 Unspecified dementia without behavioral disturbance: Secondary | ICD-10-CM | POA: Diagnosis present

## 2018-04-23 DIAGNOSIS — R296 Repeated falls: Secondary | ICD-10-CM | POA: Diagnosis not present

## 2018-04-23 DIAGNOSIS — S8262XA Displaced fracture of lateral malleolus of left fibula, initial encounter for closed fracture: Secondary | ICD-10-CM | POA: Diagnosis present

## 2018-04-23 DIAGNOSIS — S82891D Other fracture of right lower leg, subsequent encounter for closed fracture with routine healing: Secondary | ICD-10-CM | POA: Diagnosis not present

## 2018-04-23 DIAGNOSIS — F0281 Dementia in other diseases classified elsewhere with behavioral disturbance: Secondary | ICD-10-CM | POA: Diagnosis not present

## 2018-04-23 DIAGNOSIS — R4182 Altered mental status, unspecified: Secondary | ICD-10-CM | POA: Diagnosis not present

## 2018-04-23 DIAGNOSIS — F0391 Unspecified dementia with behavioral disturbance: Secondary | ICD-10-CM | POA: Diagnosis not present

## 2018-04-23 DIAGNOSIS — S82891A Other fracture of right lower leg, initial encounter for closed fracture: Secondary | ICD-10-CM | POA: Diagnosis not present

## 2018-04-23 DIAGNOSIS — S82892D Other fracture of left lower leg, subsequent encounter for closed fracture with routine healing: Secondary | ICD-10-CM | POA: Diagnosis not present

## 2018-04-23 DIAGNOSIS — Z4789 Encounter for other orthopedic aftercare: Secondary | ICD-10-CM | POA: Diagnosis not present

## 2018-04-26 DIAGNOSIS — S8262XA Displaced fracture of lateral malleolus of left fibula, initial encounter for closed fracture: Secondary | ICD-10-CM | POA: Diagnosis not present

## 2018-04-26 DIAGNOSIS — S82892D Other fracture of left lower leg, subsequent encounter for closed fracture with routine healing: Secondary | ICD-10-CM | POA: Diagnosis not present

## 2018-04-26 DIAGNOSIS — S82891A Other fracture of right lower leg, initial encounter for closed fracture: Secondary | ICD-10-CM | POA: Diagnosis not present

## 2018-04-26 DIAGNOSIS — F0390 Unspecified dementia without behavioral disturbance: Secondary | ICD-10-CM | POA: Diagnosis not present

## 2018-04-26 DIAGNOSIS — S82891D Other fracture of right lower leg, subsequent encounter for closed fracture with routine healing: Secondary | ICD-10-CM | POA: Diagnosis not present

## 2018-04-26 DIAGNOSIS — Z4789 Encounter for other orthopedic aftercare: Secondary | ICD-10-CM | POA: Diagnosis not present

## 2018-04-26 DIAGNOSIS — E669 Obesity, unspecified: Secondary | ICD-10-CM | POA: Diagnosis not present

## 2018-04-26 DIAGNOSIS — S82892A Other fracture of left lower leg, initial encounter for closed fracture: Secondary | ICD-10-CM | POA: Diagnosis not present

## 2018-04-26 DIAGNOSIS — Z741 Need for assistance with personal care: Secondary | ICD-10-CM | POA: Diagnosis not present

## 2018-04-26 DIAGNOSIS — R296 Repeated falls: Secondary | ICD-10-CM | POA: Diagnosis not present

## 2018-04-26 DIAGNOSIS — S8290XA Unspecified fracture of unspecified lower leg, initial encounter for closed fracture: Secondary | ICD-10-CM | POA: Diagnosis not present

## 2018-04-26 DIAGNOSIS — F0391 Unspecified dementia with behavioral disturbance: Secondary | ICD-10-CM | POA: Diagnosis not present

## 2018-04-26 DIAGNOSIS — M6281 Muscle weakness (generalized): Secondary | ICD-10-CM | POA: Diagnosis not present

## 2018-04-26 DIAGNOSIS — S8292XA Unspecified fracture of left lower leg, initial encounter for closed fracture: Secondary | ICD-10-CM | POA: Diagnosis not present

## 2018-04-26 DIAGNOSIS — S82851E Displaced trimalleolar fracture of right lower leg, subsequent encounter for open fracture type I or II with routine healing: Secondary | ICD-10-CM | POA: Diagnosis not present

## 2018-04-26 DIAGNOSIS — Z9181 History of falling: Secondary | ICD-10-CM | POA: Diagnosis not present

## 2018-04-26 DIAGNOSIS — Z743 Need for continuous supervision: Secondary | ICD-10-CM | POA: Diagnosis not present

## 2018-04-26 DIAGNOSIS — R5381 Other malaise: Secondary | ICD-10-CM | POA: Diagnosis not present

## 2018-04-26 DIAGNOSIS — S8291XB Unspecified fracture of right lower leg, initial encounter for open fracture type I or II: Secondary | ICD-10-CM | POA: Diagnosis not present

## 2018-04-26 DIAGNOSIS — S82851A Displaced trimalleolar fracture of right lower leg, initial encounter for closed fracture: Secondary | ICD-10-CM | POA: Diagnosis not present

## 2018-04-26 DIAGNOSIS — Z4781 Encounter for orthopedic aftercare following surgical amputation: Secondary | ICD-10-CM | POA: Diagnosis not present

## 2018-04-26 DIAGNOSIS — Z299 Encounter for prophylactic measures, unspecified: Secondary | ICD-10-CM | POA: Diagnosis not present

## 2018-04-26 DIAGNOSIS — I10 Essential (primary) hypertension: Secondary | ICD-10-CM | POA: Diagnosis not present

## 2018-04-26 DIAGNOSIS — S82851D Displaced trimalleolar fracture of right lower leg, subsequent encounter for closed fracture with routine healing: Secondary | ICD-10-CM | POA: Diagnosis not present

## 2018-04-26 DIAGNOSIS — R531 Weakness: Secondary | ICD-10-CM | POA: Diagnosis not present

## 2018-04-26 DIAGNOSIS — R4182 Altered mental status, unspecified: Secondary | ICD-10-CM | POA: Diagnosis not present

## 2018-04-26 DIAGNOSIS — F0280 Dementia in other diseases classified elsewhere without behavioral disturbance: Secondary | ICD-10-CM | POA: Diagnosis not present

## 2018-04-27 DIAGNOSIS — F0390 Unspecified dementia without behavioral disturbance: Secondary | ICD-10-CM | POA: Diagnosis not present

## 2018-04-27 DIAGNOSIS — S8290XA Unspecified fracture of unspecified lower leg, initial encounter for closed fracture: Secondary | ICD-10-CM | POA: Diagnosis not present

## 2018-04-27 DIAGNOSIS — E669 Obesity, unspecified: Secondary | ICD-10-CM | POA: Diagnosis not present

## 2018-04-27 DIAGNOSIS — I10 Essential (primary) hypertension: Secondary | ICD-10-CM | POA: Diagnosis not present

## 2018-04-29 DIAGNOSIS — Z741 Need for assistance with personal care: Secondary | ICD-10-CM | POA: Diagnosis not present

## 2018-04-29 DIAGNOSIS — M6281 Muscle weakness (generalized): Secondary | ICD-10-CM | POA: Diagnosis not present

## 2018-04-29 DIAGNOSIS — S82851E Displaced trimalleolar fracture of right lower leg, subsequent encounter for open fracture type I or II with routine healing: Secondary | ICD-10-CM | POA: Diagnosis not present

## 2018-04-29 DIAGNOSIS — Z9181 History of falling: Secondary | ICD-10-CM | POA: Diagnosis not present

## 2018-04-29 DIAGNOSIS — F0280 Dementia in other diseases classified elsewhere without behavioral disturbance: Secondary | ICD-10-CM | POA: Diagnosis not present

## 2018-04-29 DIAGNOSIS — Z4781 Encounter for orthopedic aftercare following surgical amputation: Secondary | ICD-10-CM | POA: Diagnosis not present

## 2018-04-29 DIAGNOSIS — S82892A Other fracture of left lower leg, initial encounter for closed fracture: Secondary | ICD-10-CM | POA: Diagnosis not present

## 2018-04-29 DIAGNOSIS — S8292XA Unspecified fracture of left lower leg, initial encounter for closed fracture: Secondary | ICD-10-CM | POA: Diagnosis not present

## 2018-04-29 DIAGNOSIS — F0390 Unspecified dementia without behavioral disturbance: Secondary | ICD-10-CM | POA: Diagnosis not present

## 2018-04-29 DIAGNOSIS — R5381 Other malaise: Secondary | ICD-10-CM | POA: Diagnosis not present

## 2018-04-29 DIAGNOSIS — I10 Essential (primary) hypertension: Secondary | ICD-10-CM | POA: Diagnosis not present

## 2018-04-29 DIAGNOSIS — S82891A Other fracture of right lower leg, initial encounter for closed fracture: Secondary | ICD-10-CM | POA: Diagnosis not present

## 2018-04-30 DIAGNOSIS — F0390 Unspecified dementia without behavioral disturbance: Secondary | ICD-10-CM | POA: Diagnosis not present

## 2018-05-01 DIAGNOSIS — R4182 Altered mental status, unspecified: Secondary | ICD-10-CM | POA: Diagnosis not present

## 2018-05-01 DIAGNOSIS — F0390 Unspecified dementia without behavioral disturbance: Secondary | ICD-10-CM | POA: Diagnosis not present

## 2018-05-01 DIAGNOSIS — S82891A Other fracture of right lower leg, initial encounter for closed fracture: Secondary | ICD-10-CM | POA: Diagnosis not present

## 2018-05-01 DIAGNOSIS — Z299 Encounter for prophylactic measures, unspecified: Secondary | ICD-10-CM | POA: Diagnosis not present

## 2018-05-05 DIAGNOSIS — F0390 Unspecified dementia without behavioral disturbance: Secondary | ICD-10-CM | POA: Diagnosis not present

## 2018-05-05 DIAGNOSIS — E669 Obesity, unspecified: Secondary | ICD-10-CM | POA: Diagnosis not present

## 2018-05-05 DIAGNOSIS — S8290XA Unspecified fracture of unspecified lower leg, initial encounter for closed fracture: Secondary | ICD-10-CM | POA: Diagnosis not present

## 2018-05-05 DIAGNOSIS — I10 Essential (primary) hypertension: Secondary | ICD-10-CM | POA: Diagnosis not present

## 2018-05-07 DIAGNOSIS — F0390 Unspecified dementia without behavioral disturbance: Secondary | ICD-10-CM | POA: Diagnosis not present

## 2018-05-08 DIAGNOSIS — M6281 Muscle weakness (generalized): Secondary | ICD-10-CM | POA: Diagnosis not present

## 2018-05-08 DIAGNOSIS — Z741 Need for assistance with personal care: Secondary | ICD-10-CM | POA: Diagnosis not present

## 2018-05-08 DIAGNOSIS — S82851E Displaced trimalleolar fracture of right lower leg, subsequent encounter for open fracture type I or II with routine healing: Secondary | ICD-10-CM | POA: Diagnosis not present

## 2018-05-08 DIAGNOSIS — F0280 Dementia in other diseases classified elsewhere without behavioral disturbance: Secondary | ICD-10-CM | POA: Diagnosis not present

## 2018-05-08 DIAGNOSIS — S8292XA Unspecified fracture of left lower leg, initial encounter for closed fracture: Secondary | ICD-10-CM | POA: Diagnosis not present

## 2018-05-08 DIAGNOSIS — R5381 Other malaise: Secondary | ICD-10-CM | POA: Diagnosis not present

## 2018-05-08 DIAGNOSIS — Z9181 History of falling: Secondary | ICD-10-CM | POA: Diagnosis not present

## 2018-05-08 DIAGNOSIS — Z4781 Encounter for orthopedic aftercare following surgical amputation: Secondary | ICD-10-CM | POA: Diagnosis not present

## 2018-05-10 ENCOUNTER — Encounter: Payer: Self-pay | Admitting: Family Medicine

## 2018-05-10 DIAGNOSIS — Z9181 History of falling: Secondary | ICD-10-CM | POA: Diagnosis not present

## 2018-05-10 DIAGNOSIS — M6281 Muscle weakness (generalized): Secondary | ICD-10-CM | POA: Diagnosis not present

## 2018-05-10 DIAGNOSIS — F0390 Unspecified dementia without behavioral disturbance: Secondary | ICD-10-CM | POA: Diagnosis not present

## 2018-05-10 DIAGNOSIS — S8292XA Unspecified fracture of left lower leg, initial encounter for closed fracture: Secondary | ICD-10-CM | POA: Diagnosis not present

## 2018-05-10 DIAGNOSIS — I10 Essential (primary) hypertension: Secondary | ICD-10-CM | POA: Diagnosis not present

## 2018-05-10 DIAGNOSIS — S82851A Displaced trimalleolar fracture of right lower leg, initial encounter for closed fracture: Secondary | ICD-10-CM | POA: Diagnosis not present

## 2018-05-10 DIAGNOSIS — Z299 Encounter for prophylactic measures, unspecified: Secondary | ICD-10-CM | POA: Diagnosis not present

## 2018-05-10 DIAGNOSIS — S8262XA Displaced fracture of lateral malleolus of left fibula, initial encounter for closed fracture: Secondary | ICD-10-CM | POA: Diagnosis not present

## 2018-05-10 DIAGNOSIS — Z741 Need for assistance with personal care: Secondary | ICD-10-CM | POA: Diagnosis not present

## 2018-05-10 DIAGNOSIS — S82891A Other fracture of right lower leg, initial encounter for closed fracture: Secondary | ICD-10-CM | POA: Diagnosis not present

## 2018-05-10 DIAGNOSIS — R5381 Other malaise: Secondary | ICD-10-CM | POA: Diagnosis not present

## 2018-05-10 DIAGNOSIS — R4182 Altered mental status, unspecified: Secondary | ICD-10-CM | POA: Diagnosis not present

## 2018-05-10 DIAGNOSIS — F0280 Dementia in other diseases classified elsewhere without behavioral disturbance: Secondary | ICD-10-CM | POA: Diagnosis not present

## 2018-05-10 DIAGNOSIS — S82892A Other fracture of left lower leg, initial encounter for closed fracture: Secondary | ICD-10-CM | POA: Diagnosis not present

## 2018-05-10 DIAGNOSIS — Z4781 Encounter for orthopedic aftercare following surgical amputation: Secondary | ICD-10-CM | POA: Diagnosis not present

## 2018-05-10 DIAGNOSIS — S82851E Displaced trimalleolar fracture of right lower leg, subsequent encounter for open fracture type I or II with routine healing: Secondary | ICD-10-CM | POA: Diagnosis not present

## 2018-05-16 ENCOUNTER — Ambulatory Visit
Admission: EM | Admit: 2018-05-16 | Discharge: 2018-05-16 | Disposition: A | Discharge status: Home / Self Care | Payer: Medicare Other | Attending: Physician Assistant | Admitting: Physician Assistant

## 2018-05-16 ENCOUNTER — Ambulatory Visit (INDEPENDENT_AMBULATORY_CARE_PROVIDER_SITE_OTHER): Payer: Medicare Other

## 2018-05-16 DIAGNOSIS — M25571 Pain in right ankle and joints of right foot: Secondary | ICD-10-CM

## 2018-05-16 DIAGNOSIS — I10 Essential (primary) hypertension: Secondary | ICD-10-CM | POA: Diagnosis not present

## 2018-05-16 HISTORY — DX: Unspecified osteoarthritis, unspecified site: M19.90

## 2018-05-16 HISTORY — DX: Unspecified dementia, unspecified severity, without behavioral disturbance, psychotic disturbance, mood disturbance, and anxiety: F03.90

## 2018-05-16 LAB — CBC WITH DIFFERENTIAL/PLATELET
Basophils Absolute: 0 10*3/uL (ref 0.0–0.2)
Basos: 1 %
EOS (ABSOLUTE): 0 10*3/uL (ref 0.0–0.4)
Eos: 0 %
Hematocrit: 42.7 % (ref 34.0–46.6)
Hemoglobin: 13.9 g/dL (ref 11.1–15.9)
Immature Grans (Abs): 0 10*3/uL (ref 0.0–0.1)
Immature Granulocytes: 0 %
Lymphocytes Absolute: 2.6 10*3/uL (ref 0.7–3.1)
Lymphs: 42 %
MCH: 29.4 pg (ref 26.6–33.0)
MCHC: 32.6 g/dL (ref 31.5–35.7)
MCV: 90 fL (ref 79–97)
Monocytes Absolute: 0.4 10*3/uL (ref 0.1–0.9)
Monocytes: 6 %
Neutrophils Absolute: 3.1 10*3/uL (ref 1.4–7.0)
Neutrophils: 51 %
Platelets: 365 10*3/uL (ref 150–450)
RBC: 4.73 x10E6/uL (ref 3.77–5.28)
RDW: 13.3 % (ref 11.7–15.4)
WBC: 6.1 10*3/uL (ref 3.4–10.8)

## 2018-05-16 LAB — COMPREHENSIVE METABOLIC PANEL
ALT: 9 IU/L (ref 0–32)
AST: 12 IU/L (ref 0–40)
Albumin/Globulin Ratio: 2.2 (ref 1.2–2.2)
Albumin: 4.7 g/dL (ref 3.8–4.9)
Alkaline Phosphatase: 89 IU/L (ref 39–117)
BUN/Creatinine Ratio: 12 (ref 9–23)
BUN: 9 mg/dL (ref 6–24)
Bilirubin Total: 0.4 mg/dL (ref 0.0–1.2)
CO2: 22 mmol/L (ref 20–29)
Calcium: 9.6 mg/dL (ref 8.7–10.2)
Chloride: 104 mmol/L (ref 96–106)
Creatinine, Ser: 0.75 mg/dL (ref 0.57–1.00)
GFR calc Af Amer: 102 mL/min/{1.73_m2} (ref >59)
GFR calc non Af Amer: 89 mL/min/{1.73_m2} (ref >59)
Globulin, Total: 2.1 g/dL (ref 1.5–4.5)
Glucose: 100 mg/dL — ABNORMAL HIGH (ref 65–99)
Potassium: 3.8 mmol/L (ref 3.5–5.2)
Sodium: 144 mmol/L (ref 134–144)
Total Protein: 6.8 g/dL (ref 6.0–8.5)

## 2018-05-16 MED ORDER — DICLOFENAC SODIUM 1 % TD GEL: 2.0000 g | Freq: Four times a day (QID) | TRANSDERMAL | 0 refills | Status: DC

## 2018-05-16 MED ORDER — CLONIDINE HCL ER 0.1 MG PO TB12
0.1000 mg | ORAL_TABLET | Freq: Once | ORAL | Status: AC
  Administered 2018-05-16: 20:00:00 0.1 mg via ORAL

## 2018-05-16 MED ORDER — ASPIRIN 81 MG PO TABS: 81.0000 mg | ORAL_TABLET | Freq: Two times a day (BID) | ORAL | 0 refills | Status: DC

## 2018-05-16 NOTE — ED Triage Notes (Signed)
Pts daughter states she brought her mom down from [place] this week. States she had rt ankle surgery in march. States she is still having pain.

## 2018-05-16 NOTE — ED Provider Notes (Signed)
[facility]    CSN: [id number] Arrival date & time: 05/16/18  1835     History   Chief Complaint Chief Complaint  Patient presents with   Ankle Pain    HPI Sherry Hicks is a 58 y.o. female.   58 year old female with history of dementia, hypertension comes in with daughter for follow-up. HPI limited and provided by daughter due to patient's dementia. Daughter states that patient was [place] skilled nursing [place] ankle surgery from fracture earlier in March. Unknown date of surgery, but was transferred SNF on 3/13. She was discharged on 3/27 and was brought to [facility]. Daughter states at discharge, was given some paperwork, but was not given any instructions except that patient will need follow up. Daughter states has been trying to find a PCP for patient since 3/27, but has not been able find follow up, and therefore came in here for evaluation.  Patient was discharged with cam walker and wheelchair, however, has not been using since getting to [place]. Current housing condition is on 2nd floor, and unable to fit wheelchair on 2nd floor. Patient has been weightbearing on both feet with limping. She has been complaining of increased pain to the right ankle since [place] in [place]. She has had increased swelling in the right ankle. Does not experience any numbness/tingling. She is currently on Keflex for infection. No obvious spreading erythema, warmth, fever. Patient continues ankle ROM exercise throughout the day. Not currently on any blood thinners. No calf swelling, chest pain, shortness of breath.  Patient has not seen a PCP in "awhile". Known history of HTN on multiple BP meds. Has not been on HTN meds for years. She is found hypertensive at triage. Denies chest pain, shortness of breath, palpitation, headache, blurry vision. No known history of DM, CAD.      Past Medical History:  Diagnosis Date   Arthritis    Dementia (HCC)     Hypertension     Patient Active Problem List   Diagnosis Date Noted   OTHER ENTHESOPATHY OF ANKLE AND TARSUS 05/11/2008   PES PLANUS 05/11/2008   OBESITY, NOS 04/12/2006   DEPRESSION, MAJOR, RECURRENT 04/12/2006   TOBACCO DEPENDENCE 04/12/2006   HYPERTENSION, BENIGN SYSTEMIC 04/12/2006    Past Surgical History:  Procedure Laterality Date   ABDOMINAL HYSTERECTOMY      OB History   No obstetric history on file.      Home Medications    Prior to Admission medications   Medication Sig Start Date End Date Taking? Authorizing Provider  aspirin 81 MG tablet Take 1 tablet (81 mg total) by mouth 2 (two) times daily. 05/16/18   Cathie HoopsYu, Ragina Fenter V, PA-C  benazepril (LOTENSIN) 40 MG tablet Take 40 mg by mouth daily.      [provider]  diclofenac sodium (VOLTAREN) 1 % GEL Apply 2 g topically 4 (four) times daily. 05/16/18   Cathie HoopsYu, Jaanvi Fizer V, PA-C  escitalopram (LEXAPRO) 20 MG tablet Take 20 mg by mouth daily.      [provider]  hydrochlorothiazide 25 MG tablet Take 25 mg by mouth daily.      [provider]    Family History No family history on file.  Social History Social History   Tobacco Use   Smoking status: Never Smoker   Smokeless tobacco: Never Used  Substance Use Topics   Alcohol use: No   Drug use: No     Allergies   Patient has  no known allergies.   Review of Systems Review of Systems  Reason unable to perform ROS: See HPI as above.     Physical Exam Triage Vital Signs ED Triage Vitals [05/16/18 1851]  Enc Vitals Group     BP (!) 182/108     Pulse Rate 85     Resp 18     Temp 98.8 F (37.1 C)     Temp src      SpO2 97 %     Weight      Height      Head Circumference      Peak Flow      Pain Score 8     Pain Loc      Pain Edu?      Excl. in GC?    No data found.  Updated Vital Signs BP (!) 162/104 (BP Location: Right Arm)    Pulse 83    Temp 98.8 F (37.1 C)    Resp 18    SpO2 97%   Physical  Exam Constitutional:      General: She is not in acute distress.    Appearance: She is well-developed. She is not diaphoretic.  HENT:     Head: Normocephalic and atraumatic.  Eyes:     Conjunctiva/sclera: Conjunctivae normal.     Pupils: Pupils are equal, round, and reactive to light.  Neck:     Musculoskeletal: Normal range of motion and neck supple.  Musculoskeletal:     Comments: Swelling to the right lateral ankle.  Incision site clean and dry.  No erythema, warmth.  Tenderness to palpation along lateral malleolus.  Full range of motion.  Will defer strength given patient is supposed to be nonweightbearing.  Sensation intact and equal bilaterally.  Pedal pulse 2+, cap refill less than 2 seconds.  No swelling, erythema, warmth of the calf.  No tenderness to palpation of the calf.  Negative Homans.  Neurological:     Mental Status: She is alert and oriented to person, place, and time.      UC Treatments / Results  Labs (all labs ordered are listed, but only abnormal results are displayed) Labs Reviewed  CBC WITH DIFFERENTIAL/PLATELET  COMPREHENSIVE METABOLIC PANEL    EKG None  Radiology Dg Ankle Complete Right  Result Date: 05/16/2018 CLINICAL DATA:  58 year old female with right ankle pain. Recent surgery with continued pain. EXAM: RIGHT ANKLE - COMPLETE 3+ VIEW COMPARISON:  None. FINDINGS: Status post fixation of the distal fibula and lateral malleolus fractures with fixation sideplate and screws. A transverse screw extends from the distal fibula into the distal tibial metaphysis. The orthopedic hardware appears intact and in appropriate alignment. No acute fracture identified. The bones are osteopenic. There is no dislocation. The ankle mortise is intact. Old-appearing fragment inferior to the medial malleolus, likely an accessory ossicle or a related to prior avulsion injury. There is soft tissue swelling over the lateral malleolus. IMPRESSION: 1. No acute fracture or  dislocation. 2. Postsurgical changes of the distal fibula and lateral malleolus. No complication. 3. Soft tissue swelling over the lateral malleolus. Electronically Signed   By: Elgie Collard M.D.   On: 05/16/2018 19:35    Procedures Procedures (including critical care time)  Medications Ordered in UC Medications  cloNIDine HCl (KAPVAY) ER tablet 0.1 mg (0.1 mg Oral Given 05/16/18 1957)    Initial Impression / Assessment and Plan / UC Course  I have reviewed the triage vital signs and the nursing notes.  Pertinent labs & imaging results that were available during my care of the patient were reviewed by me and considered in my medical decision making (see chart for details).     Provided documents include discharge medication from SNF, and recommendations made by orthopedics. Patient was to be nonweightbearing of right foot with cam walker at all times except TID ankle exercise. Patient was to be weightbearing as tolerated of left foot with ASO brace. Patient to have daily dressings, wound care, and to continue monitoring of wound. Ortho recommended Lovenox 40mg  daily for DVT prevention, however, patient discharged with aspirin 81mg  BID for DVT prevention.  On exam, no signs of infection with well healing incision site. Xray without postsurgical complications. Will have patient continued keflex as directed. Patient to be nonweightbearing of RLE until cleared by ortho. Tylenol and voltaren gel for pain relief. Ice compress, elevation. Wound care instructions given.  Discussed risks and benefits of Lovenox vs aspirin for DVT prevention. Patient unable to tolerate crutches with high fall risk. Given nonweightbearing RLE, may need to be bed bound and would benefit from Lovenox. However, patient's daughter working during day and daughter is not comfortable with patient on blood thinners while only with teenage grandson during the day. Daughter worries for increased falls and possible complications.  Will have patient take aspirin as per discharge instructions from SNF. Daughter to closely monitor for DVT symptoms.   Will obtain CBC and CMP for further evaluation. Daughter to request medical records from SNF for comparison of baseline lab results. Patient with improvement of BP after clonidine. Will discuss BP meds once we have lab results.  Daughter to set up appointment for orthopedics and PCP. Resources provided. Daughter expresses understanding and agrees to plan.    X-ray without postsurgical complication.  A review of provided documents patient should be nonweightbearing on right ankle, and weightbearing as tolerated for left ankle.  Patient should be on CAM Walker for right ankle at all times except for range of motion exercises 3 times daily.  Provided information for daughter, patient to wear cam walker at all times except for range of motion exercises 3 times daily.  To continue Keflex as directed for infection prevention, no signs of infection today.  Continue Tylenol for pain relief, Voltaren gel in addition as needed.  Given patient's dementia, frequent falls, would like to avoid crutches at this time.  Patient unable to use wheelchair at home given limited space.  Discussed possibility of bedbound due to nonweightbearing of right ankle.  Discussed Lovenox/blood thinners versus aspirin as prescribed from skilled nursing facility.  Risks and benefits discussed.  Daughter would like to avoid blood thinners at this time due to risk of bleeding, and frequent falls.  Will have patient start aspirin as directed from skilled nursing facility.  Will have daughter continue to monitor for DVT signs.  Information of primary care, orthopedic provided, will have daughter call to make appointments.  Final Clinical Impressions(s) / UC Diagnoses   Final diagnoses:  Acute right ankle pain     Discharge Instructions     1.  X-ray on ankle stable, no complications on surgical hardware. 2.  She  should not be bearing weight on the right foot, please apply CAM Walker, and keep on at all times.  She can remove boot 3 times a day for right ankle range of motion exercises. 3.  She can try to bear weight as tolerated for the left foot.  She should be wearing her ankle  brace for the left foot. 4.  Continue Keflex 500 mg 4 times a day as directed.  Continue to monitor the wound, for discharge, redness, increased swelling, increased pain.  Daily dressings, make sure the wound is clean and dry.  She can continue Tylenol for pain.  She can use Voltaren gel as needed for pain.  Ice compress, elevation. 5.  Take aspirin as directed.  As discussed, we are deferring a blood thinner.  This can increase risk of blood clots to the deep veins called deep vein thrombosis, please monitor any calf swelling, redness, pain, go to the emergency department for further evaluation. 6.  Blood was drawn for blood pressure today, we will discuss appropriate medication once we have the lab results. 7.  Please call skilled nursing facility [place], have them fax [place] medical records for comparison.  Our fax number is [phone]. 8.  Please call orthopedic office, as well as primary care office as listed above tomorrow to make appointments for follow-up.    ED Prescriptions    Medication Sig Dispense Auth. Provider   aspirin 81 MG tablet Take 1 tablet (81 mg total) by mouth 2 (two) times daily. 30 tablet Koltin Wehmeyer V, PA-C   diclofenac sodium (VOLTAREN) 1 % GEL Apply 2 g topically 4 (four) times daily. 1 Tube Threasa Alpha, PA-C 05/16/18 2230

## 2018-05-16 NOTE — Discharge Instructions (Addendum)
1.  X-ray on ankle stable, no complications on surgical hardware. 2.  She should not be bearing weight on the right foot, please apply CAM Walker, and keep on at all times.  She can remove boot 3 times a day for right ankle range of motion exercises. 3.  She can try to bear weight as tolerated for the left foot.  She should be wearing her ankle brace for the left foot. 4.  Continue Keflex 500 mg 4 times a day as directed.  Continue to monitor the wound, for discharge, redness, increased swelling, increased pain.  Daily dressings, make sure the wound is clean and dry.  She can continue Tylenol for pain.  She can use Voltaren gel as needed for pain.  Ice compress, elevation. 5.  Take aspirin as directed.  As discussed, we are deferring a blood thinner.  This can increase risk of blood clots to the deep veins called deep vein thrombosis, please monitor any calf swelling, redness, pain, go to the emergency department for further evaluation. 6.  Blood was drawn for blood pressure today, we will discuss appropriate medication once we have the lab results. 7.  Please call skilled nursing facility [place], have them fax [place] medical records for comparison.  Our fax number is [phone]. 8.  Please call orthopedic office, as well as primary care office as listed above tomorrow to make appointments for follow-up.

## 2018-05-16 NOTE — ED Notes (Signed)
Ice pack given and daughter educated when to [place]

## 2018-05-17 ENCOUNTER — Telehealth: Payer: Self-pay | Admitting: Physician Assistant

## 2018-05-17 ENCOUNTER — Telehealth: Payer: Self-pay | Admitting: Emergency Medicine

## 2018-05-17 MED ORDER — BENAZEPRIL HCL 40 MG PO TABS: 40.0000 mg | ORAL_TABLET | Freq: Every day | ORAL | 0 refills | Status: DC

## 2018-05-17 NOTE — Telephone Encounter (Signed)
Discussed case with Linward Headland who saw this patient yesterday. Electrolytes and Cr WNL from blood work will proceed with sending BP medicine. Holding off HCTZ for now given fractured ankle and to avoid need to go to the bathro[place] more frequently.

## 2018-05-17 NOTE — Telephone Encounter (Signed)
Opened in error

## 2018-06-18 ENCOUNTER — Ambulatory Visit: Payer: Medicare Other | Admitting: Family Medicine

## 2018-06-25 ENCOUNTER — Encounter: Payer: Self-pay | Admitting: Family Medicine

## 2018-06-25 NOTE — Progress Notes (Deleted)
Spoke with patient's daughter Sherry Hicks to go through prescreening questions & work up for appointment tomorrow. Daughter states that mom's ankle isn't bothering her as much now. She isn't using the [name] anymore but does occasionally have some pain. Daughter states that she is only taking Benazepril, Aspirin 81 mg & Voltaren gel PRN.  Daughter is concerned about patient's new diagnosis of dementia. She was diagnosed in the SNF that she was in after her ankle surgery in March 2020. Daughter states that mom is really withdrawn, isn't eating, can perform ADLs(cooking, personal care, getting dressed) & wandering. Daughter states that she is overwhelmed, her mom has never been unable to take care of herself. She is concerned that maybe the dementia diagnosis isn't the only issue. Patient only had 1 child so [name] doesn't have any help with her mom. She would like to know if there are any resources available.

## 2018-06-26 ENCOUNTER — Other Ambulatory Visit: Payer: Self-pay

## 2018-06-26 ENCOUNTER — Encounter: Payer: Self-pay | Admitting: Family Medicine

## 2018-06-26 ENCOUNTER — Ambulatory Visit (INDEPENDENT_AMBULATORY_CARE_PROVIDER_SITE_OTHER): Payer: Medicare Other | Admitting: Family Medicine

## 2018-06-26 VITALS — BP 166/103 | HR 80 | Temp 98.1°F | Resp 17 | Ht 66.0 in | Wt 225.4 lb

## 2018-06-26 DIAGNOSIS — R630 Anorexia: Secondary | ICD-10-CM | POA: Diagnosis not present

## 2018-06-26 DIAGNOSIS — I10 Essential (primary) hypertension: Secondary | ICD-10-CM

## 2018-06-26 DIAGNOSIS — F03918 Unspecified dementia, unspecified severity, with other behavioral disturbance: Secondary | ICD-10-CM

## 2018-06-26 DIAGNOSIS — F0391 Unspecified dementia with behavioral disturbance: Secondary | ICD-10-CM

## 2018-06-26 DIAGNOSIS — F330 Major depressive disorder, recurrent, mild: Secondary | ICD-10-CM | POA: Diagnosis not present

## 2018-06-26 DIAGNOSIS — F28 Other psychotic disorder not due to a substance or known physiological condition: Secondary | ICD-10-CM

## 2018-06-26 DIAGNOSIS — R7309 Other abnormal glucose: Secondary | ICD-10-CM | POA: Diagnosis not present

## 2018-06-26 DIAGNOSIS — F5104 Psychophysiologic insomnia: Secondary | ICD-10-CM

## 2018-06-26 DIAGNOSIS — R410 Disorientation, unspecified: Secondary | ICD-10-CM

## 2018-06-26 DIAGNOSIS — Z131 Encounter for screening for diabetes mellitus: Secondary | ICD-10-CM | POA: Diagnosis not present

## 2018-06-26 DIAGNOSIS — Z7689 Persons encountering health services in other specified circumstances: Secondary | ICD-10-CM

## 2018-06-26 MED ORDER — BENAZEPRIL HCL 40 MG PO TABS: 40.0000 mg | ORAL_TABLET | Freq: Every day | ORAL | 0 refills | Status: DC

## 2018-06-26 MED ORDER — TRAZODONE HCL 50 MG PO TABS: 50.0000 mg | ORAL_TABLET | Freq: Every day | ORAL | 4 refills | Status: DC

## 2018-06-26 MED ORDER — HYDROCHLOROTHIAZIDE 25 MG PO TABS: 25.0000 mg | ORAL_TABLET | Freq: Every day | ORAL | 5 refills | Status: DC

## 2018-06-26 MED ORDER — ESCITALOPRAM OXALATE 20 MG PO TABS: 20.0000 mg | ORAL_TABLET | Freq: Every day | ORAL | 2 refills | Status: DC

## 2018-06-26 MED ORDER — BENAZEPRIL HCL 40 MG PO TABS: 40.0000 mg | ORAL_TABLET | Freq: Every day | ORAL | 5 refills | Status: DC

## 2018-06-26 MED ORDER — POLYETHYLENE GLYCOL 3350 17 GM/SCOOP PO POWD: 17.0000 g | Freq: Every day | ORAL | 1 refills | Status: DC

## 2018-06-26 NOTE — Progress Notes (Signed)
Sherry Hicks, is a 58 y.o. female  GYJ:[id number]  YOV:[id number]  DOB - 1960/04/06  CC:  Chief Complaint  Patient presents with  . Establish Care  . Follow-up  . Dementia       HPI: Sherry Hicks is a 59 y.o. female is here today to establish care. She is accompanied today by her daughter that is providing the history.   Sherry Hicks has OBESITY, NOS; Major depressive disorder, recurrent episode (HCC); TOBACCO DEPENDENCE; HYPERTENSION, BENIGN SYSTEMIC; OTHER ENTHESOPATHY OF ANKLE AND TARSUS; and PES PLANUS on their problem list.    Today's visit:  Sherry Hicks is originally from [place]. Patient's daughter reports, mother suffered at fall in [place] back in February. She underwent surgery on her right ankle and was placed in a rehabilitation facility in [place]. After the completion of rehab, patient was released to the care of her daughter. The daughter reports, upon her mother being discharged from the rehabilitation facility, she was notifed that Sherry Hicks suffered from dementia and would require around the clock care.  Daughter brought mother back to [name]. Due to COVID-19, today is the first time, patient has been able to establish care with a PCP. Daughter is only to provide minimal history related to patient chronic conditions. In review of care everywhere, patient's medication were review as follows: albuterol inhaler and nebulizer treatment, benazepril, escitalopram, hydrochlorothiazide, and singulair.  She is currently taking aspirin, benazepril, and applying Voltaren gel as needed for ankle pain. Diagnosis per past medical EMR: hypertension, depression, asthma, prediabetes.  History of mental health disorder/Dementia Symptoms  Daughter reports [facility]. She is unsure of the diagnosis. She has also been followed by psychiatry in the past. Daughter has never been told that mother had any symptoms of dementia  prior to March 2020. Mother is currently not taking any psychotropic medications. Daughter is concerned with mother's inappropriate behaviors which have gradually worsened over the last month: Sherry Hicks talks to her self, misplaces things throughout the day, mood changes from crying, using profanity, yelling, verbal aggression, inappropriate laughter later, unable/unwilling to dress or bathe her self. Sherry Hicks has periods of coherent thoughts throughout the day. Sherry Hicks has very poor sleep quality and wanders around the house at night. Daughter is uncertain of her long-term plans for her mother. At present, her insurance and disability benefits are still in [place]. Daughter is planning to take her mother back to [place] this weekend and family friend will care for her while daughter is making arrangements to care for her mother long-term here in [place].  Hypertension  Sherry Hicks 's daughter reports no home monitoring of blood pressure. She is currently only taking benazepril for hypertension management however in review of previous EMR patient was prescribed hydrochlorothiazide 25 mg once daily.  She is a smoker.  She denies active  Dizziness, headaches, shortness of breath, or chest pain. She is not currently experiencing swelling of lower extremities.  Constipation Not having daily stools. She has a poor appetite and has to reminded to eat and drink. Daughter has not given mother any laxatives or stool softeners. Daughter is uncertain if this is a new problem or is she has struggled with constipation in the past.  Current medications: Current Outpatient Medications:  .  aspirin 81 MG tablet, Take 1 tablet (81 mg total) by mouth 2 (two) times daily., Disp: 30 tablet, Rfl: 0 .  benazepril (LOTENSIN) 40 MG tablet, Take 1 tablet (40 mg total) by mouth  daily., Disp: 60 tablet, Rfl: 5 .  diclofenac sodium (VOLTAREN) 1 % GEL, Apply 2 g topically 4 (four) times daily., Disp: 1 Tube, Rfl: 0 .   escitalopram (LEXAPRO) 20 MG tablet, Take 1 tablet (20 mg total) by mouth daily., Disp: 90 tablet, Rfl: 2 .  hydrochlorothiazide (HYDRODIURIL) 25 MG tablet, Take 1 tablet (25 mg total) by mouth daily., Disp: 30 tablet, Rfl: 5 .  polyethylene glycol powder (GLYCOLAX/MIRALAX) 17 GM/SCOOP powder, Take 17 g by mouth daily., Disp: 3350 g, Rfl: 1 .  traZODone (DESYREL) 50 MG tablet, Take 1 tablet (50 mg total) by mouth at bedtime., Disp: 30 tablet, Rfl: 4   Pertinent family medical history: family history includes Asthma in her father and paternal grandfather; Canavan disease in her sister; Cancer in her brother, father, paternal grandfather, and another family member; Diabetes in her maternal grandmother and another family member; Hypertension in her maternal grandmother and mother.   Allergies  Allergen Reactions  . Hydrocodone-Acetaminophen Nausea And Vomiting    Social History   Socioeconomic History  . Marital status: Single    Spouse name: Not on file  . Number of children: Not on file  . Years of education: Not on file  . Highest education level: Not on file  Occupational History  . Not on file  Social Needs  . Financial resource strain: Not on file  . Food insecurity:    Worry: Not on file    Inability: Not on file  . Transportation needs:    Medical: Not on file    Non-medical: Not on file  Tobacco Use  . Smoking status: Current Every Day Smoker  . Smokeless tobacco: Never Used  Substance and Sexual Activity  . Alcohol use: No  . Drug use: No  . Sexual activity: Not Currently    Partners: Male  Lifestyle  . Physical activity:    Days per week: Not on file    Minutes per session: Not on file  . Stress: Not on file  Relationships  . Social connections:    Talks on phone: Not on file    Gets together: Not on file    Attends religious service: Not on file    Active member of club or organization: Not on file    Attends meetings of clubs or organizations: Not on file     Relationship status: Not on file  . Intimate partner violence:    Fear of current or ex partner: Not on file    Emotionally abused: Not on file    Physically abused: Not on file    Forced sexual activity: Not on file  Other Topics Concern  . Not on file  Social History Narrative  . Not on file    Review of Systems: Pertinent negatives listed in HPI Objective:   Vitals:   06/26/18 1442  BP: (!) 166/103  Pulse: 80  Resp: 17  Temp: 98.1 F (36.7 C)    BP Readings from Last 3 Encounters:  05/16/18 (!) 162/104  12/04/14 143/79  10/30/14 122/72    Filed Weights   06/26/18 1442  Weight: 225 lb 6.4 oz (102.2 kg)      Physical Exam: Physical Exam Constitutional:      General: She is not in acute distress.    Appearance: Normal appearance. She is normal weight.  Neck:     Musculoskeletal: Normal range of motion.  Cardiovascular:     Rate and Rhythm: Normal rate and regular rhythm.  Pulses: Normal pulses.  Pulmonary:     Effort: Pulmonary effort is normal.     Breath sounds: Normal breath sounds.  Neurological:     Mental Status: She is alert. She is confused.     Cranial Nerves: No facial asymmetry.     Motor: No weakness or tremor.     Gait: Gait is intact.  Psychiatric:        Mood and Affect: Affect is labile, tearful and inappropriate.        Cognition and Memory: Cognition is impaired.        Judgment: Judgment is inappropriate.   Lab Results (prior encounters)  Lab Results  Component Value Date   WBC 6.1 05/16/2018   HGB 13.9 05/16/2018   HCT 42.7 05/16/2018   MCV 90 05/16/2018   PLT 365 05/16/2018   Lab Results  Component Value Date   CREATININE 0.75 05/16/2018   BUN 9 05/16/2018   NA 144 05/16/2018   K 3.8 05/16/2018   CL 104 05/16/2018   CO2 22 05/16/2018    Lab Results  Component Value Date   HGBA1C 5.8 (H) 06/26/2018       Component Value Date/Time   CHOL 215 (H) 03/18/2007 2235   TRIG 36 03/18/2007 2235   HDL 60 03/18/2007  2235   CHOLHDL 3.6 Ratio 03/18/2007 2235   VLDL 7 03/18/2007 2235   LDLCALC 148 (H) 03/18/2007 2235        Assessment and plan:  1. Encounter to establish care  2. HYPERTENSION, BENIGN SYSTEMIC -Resume hydrochlorothiazide. Continue Benazepril.   Blood pressure elevated x2 measurements today. Checking the following labs: - Thyroid Panel With TSH - Magnesium  3. Mild episode of recurrent major depressive disorder (HCC) 4. Other psychotic disorder not due to substance or known physiological condition (HCC) -Resuming escitalopram 20 mg once daily. -Patient would likely benefit from behavioral medicine. Daughter would like to hold off on this referral for now.  5. Screening for diabetes mellitus -Rechecking A1c patient's medical history list diagnosis of prediabetes.  6. Appetite impaired Resuming escitalopram 20 mg, hopefully this will help improve appetite  7. Behavioral and psychological symptoms of dementia [facility]) -Referring patient to neurology for further evaluation of cognitive decline. Unfortunately family and patient are poor historians regarding patient's prior mental health history and recent diagnosis of dementia.   8. Elevated glucose - Hemoglobin A1c  9. Psychophysiological insomnia -Trial trazodone 50 mg one hour prior to bedtime   10. Confusion -unknown etiology  Meds ordered this encounter  Medications  . polyethylene glycol powder (GLYCOLAX/MIRALAX) 17 GM/SCOOP powder    Sig: Take 17 g by mouth daily.    Dispense:  3350 g    Refill:  1  . escitalopram (LEXAPRO) 20 MG tablet    Sig: Take 1 tablet (20 mg total) by mouth daily.    Dispense:  90 tablet    Refill:  2  . traZODone (DESYREL) 50 MG tablet    Sig: Take 1 tablet (50 mg total) by mouth at bedtime.    Dispense:  30 tablet    Refill:  4  . DISCONTD: hydrochlorothiazide (HYDRODIURIL) 25 MG tablet    Sig: Take 1 tablet (25 mg total) by mouth daily.    Dispense:  30 tablet    Refill:  5  .  DISCONTD: benazepril (LOTENSIN) 40 MG tablet    Sig: Take 1 tablet (40 mg total) by mouth daily.    Dispense:  60 tablet  Refill:  0  . hydrochlorothiazide (HYDRODIURIL) 25 MG tablet    Sig: Take 1 tablet (25 mg total) by mouth daily.    Dispense:  30 tablet    Refill:  5  . benazepril (LOTENSIN) 40 MG tablet    Sig: Take 1 tablet (40 mg total) by mouth daily.    Dispense:  60 tablet    Refill:  5    A total of 40  minutes spent, greater than 50 % of this time was spent counseling and coordination of care.   RTC: 6 week follow-up to evaluate insomnia, discuss long-term placement and or available in home care options.  The patient was given clear instructions to go to ER or return to medical center if symptoms don't improve, worsen or new problems develop. The patient verbalized understanding. The patient was advised  to call and obtain lab results if they haven't heard anything from out office within 7-10 business days.  Joaquin CourtsKimberly Vinod Mikesell, FNP [facility] [address], [place] [id number] [phone]: [phone]    This note has been created with Dragon speech recognition software and Paediatric nursesmart phrase technology. Any transcriptional errors are unintentional.

## 2018-06-26 NOTE — Patient Instructions (Addendum)
Thank you for choosing Primary Care at [facility] to be your medical home!    Sherry Hicks was seen by Joaquin Courts, FNP today.   Ginette Pitman Dobesh's primary care provider is Bing Neighbors, FNP.   For the best care possible, you should try to see Joaquin Courts, FNP-C whenever you come to the clinic.   We look forward to seeing you again soon!  If you have any questions about your visit today, please call us at [phone] or feel free to reach your primary care provider via MyChart.      Dementia Dementia means losing some of your brain ability. People with dementia may have problems with:  Memory.  Making decisions.  Behavior.  Speaking.  Thinking.  Solving problems. Follow these instructions at home: Medicine  Take over-the-counter and prescription medicines only as told by your doctor.  Avoid taking medicines that can change how you think. These include pain or sleeping medicines. Lifestyle   Make healthy choices: ? Be active as told by your doctor. ? Do not use any tobacco products, such as cigarettes, chewing tobacco, and e-cigarettes. If you need help quitting, ask your doctor. ? Eat a healthy diet. ? When you get stressed, do something to help yourself relax. Your doctor can give you tips. ? Spend time with other people.  Drink enough fluid to keep your pee (urine) clear or pale yellow.  Make sure you get good sleep. Use these tips to help you get a good night's rest: ? Try not to take naps during the day. ? Keep your sleeping area dark and cool. ? In the few hours before you go to bed, try not to do any exercise. ? Try not to have foods and drinks with caffeine in the evening. General instructions  Talk with your doctor to figure out: ? What you need help with. ? What your safety needs are.  If you were given a bracelet that tracks your location, make sure to wear it.  Keep all follow-up visits as told by your doctor. This is  important. Contact a doctor if:  You have any new problems.  You have problems with choking or swallowing.  You have any symptoms of a different sickness. Get help right away if:  You have a fever.  You feel mixed up (confused) or more mixed up than before.  You have new sleepiness.  You have sleepiness that gets worse.  You have a hard time staying awake.  You or your family members are worried for your safety. This information is not intended to replace advice given to you by your health care provider. Make sure you discuss any questions you have with your health care provider. Document Released: 01/13/2008 Document Revised: 07/08/2015 Document Reviewed: 10/28/2014 Elsevier Interactive Patient Education  2019 [employer].

## 2018-06-27 LAB — THYROID PANEL WITH TSH
Free Thyroxine Index: 2.2 (ref 1.2–4.9)
T3 Uptake Ratio: 25 % (ref 24–39)
T4, Total: 8.6 ug/dL (ref 4.5–12.0)
TSH: 0.731 u[IU]/mL (ref 0.450–4.500)

## 2018-06-27 LAB — MAGNESIUM: Magnesium: 2.2 mg/dL (ref 1.6–2.3)

## 2018-06-27 LAB — HEMOGLOBIN A1C
Est. average glucose Bld gHb Est-mCnc: 120 mg/dL
Hgb A1c MFr Bld: 5.8 % — ABNORMAL HIGH (ref 4.8–5.6)

## 2018-07-01 ENCOUNTER — Encounter: Payer: Self-pay | Admitting: Family Medicine

## 2018-07-01 NOTE — Progress Notes (Signed)
Patient's daughter [name] notified of results & recommendations. Expressed understanding.

## 2018-09-09 ENCOUNTER — Ambulatory Visit: Payer: Medicare Other | Admitting: Diagnostic Neuroimaging

## 2018-09-09 ENCOUNTER — Telehealth: Payer: Self-pay | Admitting: *Deleted

## 2018-09-09 NOTE — Telephone Encounter (Signed)
Patient was no show for new patient appointment today. 

## 2018-09-10 ENCOUNTER — Encounter: Payer: Self-pay | Admitting: Diagnostic Neuroimaging

## 2018-10-31 DIAGNOSIS — I10 Essential (primary) hypertension: Secondary | ICD-10-CM | POA: Diagnosis not present

## 2018-10-31 DIAGNOSIS — Z79899 Other long term (current) drug therapy: Secondary | ICD-10-CM | POA: Diagnosis not present

## 2018-10-31 DIAGNOSIS — Z20828 Contact with and (suspected) exposure to other viral communicable diseases: Secondary | ICD-10-CM | POA: Diagnosis not present

## 2018-10-31 DIAGNOSIS — F23 Brief psychotic disorder: Secondary | ICD-10-CM | POA: Diagnosis not present

## 2018-11-01 DIAGNOSIS — F23 Brief psychotic disorder: Secondary | ICD-10-CM | POA: Diagnosis not present

## 2018-11-04 DIAGNOSIS — F319 Bipolar disorder, unspecified: Secondary | ICD-10-CM | POA: Diagnosis not present

## 2018-11-04 DIAGNOSIS — R413 Other amnesia: Secondary | ICD-10-CM | POA: Diagnosis not present

## 2018-11-04 DIAGNOSIS — R4182 Altered mental status, unspecified: Secondary | ICD-10-CM | POA: Diagnosis not present

## 2018-11-04 DIAGNOSIS — F69 Unspecified disorder of adult personality and behavior: Secondary | ICD-10-CM | POA: Diagnosis not present

## 2018-11-04 DIAGNOSIS — I252 Old myocardial infarction: Secondary | ICD-10-CM | POA: Diagnosis not present

## 2018-11-04 DIAGNOSIS — Z90710 Acquired absence of both cervix and uterus: Secondary | ICD-10-CM | POA: Diagnosis not present

## 2018-11-04 DIAGNOSIS — I10 Essential (primary) hypertension: Secondary | ICD-10-CM | POA: Diagnosis not present

## 2018-11-15 ENCOUNTER — Encounter (HOSPITAL_COMMUNITY): Payer: Self-pay

## 2018-11-15 ENCOUNTER — Emergency Department (HOSPITAL_COMMUNITY)
Admission: EM | Admit: 2018-11-15 | Discharge: 2018-11-15 | Discharge status: Against Medical Advice | Payer: Medicare Other | Attending: Emergency Medicine | Admitting: Emergency Medicine

## 2018-11-15 ENCOUNTER — Other Ambulatory Visit: Payer: Self-pay

## 2018-11-15 DIAGNOSIS — R45851 Suicidal ideations: Secondary | ICD-10-CM | POA: Diagnosis not present

## 2018-11-15 DIAGNOSIS — Z046 Encounter for general psychiatric examination, requested by authority: Secondary | ICD-10-CM | POA: Insufficient documentation

## 2018-11-15 DIAGNOSIS — Z5321 Procedure and treatment not carried out due to patient leaving prior to being seen by health care provider: Secondary | ICD-10-CM | POA: Diagnosis not present

## 2018-11-15 NOTE — ED Triage Notes (Addendum)
[facility] arrives with patient. Pt daughter called them because patient was expressing some SI. No plan. Daughter states that patient has been wandering around [place] earlier this week. Pt denies SI/HI. She thinks that she is here for her pregnant daughter.

## 2018-11-15 NOTE — ED Notes (Signed)
Pt daughter states that she is taking the patient home because the doctor hasnt seen her yet. Catha Gosselin states that she isnt waiting any longer. She will bring her back later. I encouraged her to stay and be evaluated, but the patient refused. The patient denies SI at this time.

## 2018-11-18 ENCOUNTER — Other Ambulatory Visit: Payer: Self-pay

## 2018-11-18 ENCOUNTER — Encounter (HOSPITAL_COMMUNITY): Payer: Self-pay | Admitting: Emergency Medicine

## 2018-11-18 ENCOUNTER — Emergency Department (HOSPITAL_COMMUNITY): Payer: Medicare Other

## 2018-11-18 ENCOUNTER — Emergency Department (HOSPITAL_COMMUNITY)
Admission: EM | Admit: 2018-11-18 | Discharge: 2018-11-20 | Disposition: A | Discharge status: Other Acute Inpatient Hospital | Payer: Medicare Other | Attending: Emergency Medicine | Admitting: Emergency Medicine

## 2018-11-18 DIAGNOSIS — Z79899 Other long term (current) drug therapy: Secondary | ICD-10-CM | POA: Insufficient documentation

## 2018-11-18 DIAGNOSIS — R4689 Other symptoms and signs involving appearance and behavior: Secondary | ICD-10-CM

## 2018-11-18 DIAGNOSIS — Z20828 Contact with and (suspected) exposure to other viral communicable diseases: Secondary | ICD-10-CM | POA: Insufficient documentation

## 2018-11-18 DIAGNOSIS — Z7982 Long term (current) use of aspirin: Secondary | ICD-10-CM | POA: Insufficient documentation

## 2018-11-18 DIAGNOSIS — Z03818 Encounter for observation for suspected exposure to other biological agents ruled out: Secondary | ICD-10-CM | POA: Diagnosis not present

## 2018-11-18 DIAGNOSIS — F0391 Unspecified dementia with behavioral disturbance: Secondary | ICD-10-CM | POA: Diagnosis not present

## 2018-11-18 DIAGNOSIS — F339 Major depressive disorder, recurrent, unspecified: Secondary | ICD-10-CM | POA: Diagnosis not present

## 2018-11-18 DIAGNOSIS — F17200 Nicotine dependence, unspecified, uncomplicated: Secondary | ICD-10-CM | POA: Diagnosis not present

## 2018-11-18 DIAGNOSIS — R4182 Altered mental status, unspecified: Secondary | ICD-10-CM | POA: Diagnosis present

## 2018-11-18 DIAGNOSIS — J45909 Unspecified asthma, uncomplicated: Secondary | ICD-10-CM | POA: Diagnosis not present

## 2018-11-18 DIAGNOSIS — Z046 Encounter for general psychiatric examination, requested by authority: Secondary | ICD-10-CM | POA: Insufficient documentation

## 2018-11-18 DIAGNOSIS — R456 Violent behavior: Secondary | ICD-10-CM | POA: Diagnosis not present

## 2018-11-18 DIAGNOSIS — I10 Essential (primary) hypertension: Secondary | ICD-10-CM | POA: Insufficient documentation

## 2018-11-18 DIAGNOSIS — R410 Disorientation, unspecified: Secondary | ICD-10-CM | POA: Diagnosis not present

## 2018-11-18 DIAGNOSIS — R4020 Unspecified coma: Secondary | ICD-10-CM | POA: Diagnosis not present

## 2018-11-18 LAB — CBC WITH DIFFERENTIAL/PLATELET
Abs Immature Granulocytes: 0.02 10*3/uL (ref 0.00–0.07)
Basophils Absolute: 0 10*3/uL (ref 0.0–0.1)
Basophils Relative: 0 %
Eosinophils Absolute: 0 10*3/uL (ref 0.0–0.5)
Eosinophils Relative: 0 %
HCT: 42.7 % (ref 36.0–46.0)
Hemoglobin: 13.4 g/dL (ref 12.0–15.0)
Immature Granulocytes: 0 %
Lymphocytes Relative: 35 %
Lymphs Abs: 2.5 10*3/uL (ref 0.7–4.0)
MCH: 30.2 pg (ref 26.0–34.0)
MCHC: 31.4 g/dL (ref 30.0–36.0)
MCV: 96.2 fL (ref 80.0–100.0)
Monocytes Absolute: 0.4 10*3/uL (ref 0.1–1.0)
Monocytes Relative: 5 %
Neutro Abs: 4.3 10*3/uL (ref 1.7–7.7)
Neutrophils Relative %: 60 %
Platelets: 319 10*3/uL (ref 150–400)
RBC: 4.44 MIL/uL (ref 3.87–5.11)
RDW: 13.3 % (ref 11.5–15.5)
WBC: 7.3 10*3/uL (ref 4.0–10.5)
nRBC: 0 % (ref 0.0–0.2)

## 2018-11-18 LAB — COMPREHENSIVE METABOLIC PANEL
ALT: 11 U/L (ref 0–44)
AST: 12 U/L — ABNORMAL LOW (ref 15–41)
Albumin: 4.2 g/dL (ref 3.5–5.0)
Alkaline Phosphatase: 76 U/L (ref 38–126)
Anion gap: 8 (ref 5–15)
BUN: 12 mg/dL (ref 6–20)
CO2: 29 mmol/L (ref 22–32)
Calcium: 9.3 mg/dL (ref 8.9–10.3)
Chloride: 106 mmol/L (ref 98–111)
Creatinine, Ser: 0.76 mg/dL (ref 0.44–1.00)
GFR calc Af Amer: >60 mL/min (ref >60)
GFR calc non Af Amer: >60 mL/min (ref >60)
Glucose, Bld: 93 mg/dL (ref 70–99)
Potassium: 4 mmol/L (ref 3.5–5.1)
Sodium: 143 mmol/L (ref 135–145)
Total Bilirubin: 0.3 mg/dL (ref 0.3–1.2)
Total Protein: 7.4 g/dL (ref 6.5–8.1)

## 2018-11-18 LAB — RAPID URINE DRUG SCREEN, HOSP PERFORMED
Amphetamines: NOT DETECTED
Barbiturates: NOT DETECTED
Benzodiazepines: NOT DETECTED
Cocaine: NOT DETECTED
Opiates: NOT DETECTED
Tetrahydrocannabinol: NOT DETECTED

## 2018-11-18 LAB — ETHANOL: Alcohol, Ethyl (B): <10 mg/dL (ref <10)

## 2018-11-18 MED ORDER — HYDROCHLOROTHIAZIDE 25 MG PO TABS: 25.0000 mg | ORAL_TABLET | Freq: Every day | ORAL | Status: DC

## 2018-11-18 MED ORDER — ESCITALOPRAM OXALATE 10 MG PO TABS
20.0000 mg | ORAL_TABLET | Freq: Every day | ORAL | Status: DC
  Administered 2018-11-19 – 2018-11-20 (×2): 20 mg via ORAL
  Filled 2018-11-18 (×2): qty 2

## 2018-11-18 MED ORDER — BENAZEPRIL HCL 20 MG PO TABS
40.0000 mg | ORAL_TABLET | Freq: Every day | ORAL | Status: DC
  Administered 2018-11-19 – 2018-11-20 (×2): 40 mg via ORAL
  Filled 2018-11-18 (×2): qty 2

## 2018-11-18 MED ORDER — TRAZODONE HCL 50 MG PO TABS
50.0000 mg | ORAL_TABLET | Freq: Every day | ORAL | Status: DC
  Administered 2018-11-19: 21:00:00 50 mg via ORAL
  Filled 2018-11-18: qty 1

## 2018-11-18 NOTE — ED Provider Notes (Signed)
[facility]-EMERGENCY DEPT Provider Note   CSN: [id number] Arrival date & time: 11/18/18  1813     History   Chief Complaint Chief Complaint  Patient presents with  . Altered Mental Status  . Medical Clearance    HPI Sherry Hicks is a 58 y.o. female.     HPI Pt states she was brought to the ED by family members.  Per EMS report mobile crisis evaluated pt.  PT has been more confused recently.  SHe cut a whole in her mattress at home with a knife  and had been acting agressiveley.  Pt denies any complaints.    According to the medical records the patient had a visit with her primary care doctor in May of this year.  During that visit it was reported the patient had a history of inpatient behavioral health treatment in the past.  During that visit the daughter reported that the patient had inappropriate behaviors including misplacing things, rapid mood changes, using profanity and verbal aggression.  She was also not dressing or bathing herself at that time.  Family declined psychiatry referral. [facility]  Medical records indication the patient to not follow-up with an appointment on July 27. Past Medical History:  Diagnosis Date  . Arthritis   . Asthma   . Dementia (HCC)   . Hypertension   . Smoker     Patient Active Problem List   Diagnosis Date Noted  . OTHER ENTHESOPATHY OF ANKLE AND TARSUS 05/11/2008  . PES PLANUS 05/11/2008  . OBESITY, NOS 04/12/2006  . Major depressive disorder, recurrent episode (HCC) 04/12/2006  . TOBACCO DEPENDENCE 04/12/2006  . HYPERTENSION, BENIGN SYSTEMIC 04/12/2006    Past Surgical History:  Procedure Laterality Date  . ABDOMINAL HYSTERECTOMY    . ANKLE SURGERY    . SALPINGOOPHORECTOMY       OB History   No obstetric history on file.      Home Medications    Prior to Admission medications   Medication Sig Start Date End Date Taking? Authorizing Provider  aspirin 81 MG  tablet Take 1 tablet (81 mg total) by mouth 2 (two) times daily. 05/16/18   Cathie HoopsYu, Amy V, PA-C  benazepril (LOTENSIN) 40 MG tablet Take 1 tablet (40 mg total) by mouth daily. 06/26/18   Bing NeighborsHarris, Kimberly S, FNP  diclofenac sodium (VOLTAREN) 1 % GEL Apply 2 g topically 4 (four) times daily. 05/16/18   Cathie HoopsYu, Amy V, PA-C  escitalopram (LEXAPRO) 20 MG tablet Take 1 tablet (20 mg total) by mouth daily. 06/26/18   Bing NeighborsHarris, Kimberly S, FNP  hydrochlorothiazide (HYDRODIURIL) 25 MG tablet Take 1 tablet (25 mg total) by mouth daily. 06/26/18   Bing NeighborsHarris, Kimberly S, FNP  polyethylene glycol powder (GLYCOLAX/MIRALAX) 17 GM/SCOOP powder Take 17 g by mouth daily. 06/26/18   Bing NeighborsHarris, Kimberly S, FNP  traZODone (DESYREL) 50 MG tablet Take 1 tablet (50 mg total) by mouth at bedtime. 06/26/18   Bing NeighborsHarris, Kimberly S, FNP    Family History Family History  Problem Relation Age of Onset  . Hypertension Mother   . Asthma Father   . Cancer Father   . Canavan disease Sister   . Cancer Brother   . Diabetes Maternal Grandmother   . Hypertension Maternal Grandmother   . Asthma Paternal Grandfather   . Cancer Paternal Grandfather   . Cancer Other   . Diabetes Other     Social History Social History   Tobacco Use  .  Smoking status: Current Every Day Smoker  . Smokeless tobacco: Never Used  Substance Use Topics  . Alcohol use: No  . Drug use: No     Allergies   Hydrocodone-acetaminophen   Review of Systems Review of Systems  All other systems reviewed and are negative.    Physical Exam Updated Vital Signs BP (!) 149/88   Pulse 70   Temp 98.5 F (36.9 C) (Oral)   Resp 18   SpO2 99%   Physical Exam Vitals signs and nursing note reviewed.  Constitutional:      General: She is not in acute distress.    Appearance: She is well-developed.     Comments: Pleasant, laughing  HENT:     Head: Normocephalic and atraumatic.     Right Ear: External ear normal.     Left Ear: External ear normal.  Eyes:     General:  No scleral icterus.       Right eye: No discharge.        Left eye: No discharge.     Conjunctiva/sclera: Conjunctivae normal.  Neck:     Musculoskeletal: Neck supple.     Trachea: No tracheal deviation.  Cardiovascular:     Rate and Rhythm: Normal rate and regular rhythm.  Pulmonary:     Effort: Pulmonary effort is normal. No respiratory distress.     Breath sounds: Normal breath sounds. No stridor. No wheezing or rales.  Abdominal:     General: Bowel sounds are normal. There is no distension.     Palpations: Abdomen is soft.     Tenderness: There is no abdominal tenderness. There is no guarding or rebound.  Musculoskeletal:        General: No tenderness.  Skin:    General: Skin is warm and dry.     Findings: No rash.  Neurological:     General: No focal deficit present.     Mental Status: She is alert. She is disoriented.     Cranial Nerves: No cranial nerve deficit (no facial droop, extraocular movements intact, no slurred speech).     Sensory: No sensory deficit.     Motor: No abnormal muscle tone or seizure activity.     Coordination: Coordination normal.     Comments: Patient is unaware of the year, she [facility]  Psychiatric:        Attention and Perception: She is attentive.        Mood and Affect: Mood is not depressed. Affect is not tearful.        Speech: Speech is tangential.        Behavior: Behavior is not agitated or aggressive. Behavior is cooperative.        Thought Content: Thought content does not include homicidal or suicidal ideation.        Cognition and Memory: Memory is impaired.      ED Treatments / Results  Labs (all labs ordered are listed, but only abnormal results are displayed) Labs Reviewed  COMPREHENSIVE METABOLIC PANEL - Abnormal; Notable for the following components:      Result Value   AST 12 (*)    All other components within normal limits  ETHANOL  CBC WITH DIFFERENTIAL/PLATELET  RAPID URINE DRUG  SCREEN, HOSP PERFORMED  I-STAT BETA HCG BLOOD, ED ([facility], [facility], [facility] ONLY)    EKG None  Radiology Ct Head Wo Contrast  Result Date: 11/18/2018 CLINICAL DATA:  Altered level of consciousness unexplained. EXAM: CT  HEAD WITHOUT CONTRAST TECHNIQUE: Contiguous axial images were obtained from the base of the skull through the vertex without intravenous contrast. COMPARISON:  Head CT dated 12/27/2005. FINDINGS: Brain: Ventricles are stable in size and configuration. Generalized parenchymal volume loss, moderate in degree for age. No mass, hemorrhage, edema or other evidence of acute parenchymal abnormality. No extra-axial hemorrhage. Vascular: No hyperdense vessel or unexpected calcification. Skull: Normal. Negative for fracture or focal lesion. Sinuses/Orbits: No acute finding. Other: None. IMPRESSION: No acute findings. No intracranial mass, hemorrhage or edema. Electronically Signed   By: Franki Cabot M.D.   On: 11/18/2018 19:55    Procedures Procedures (including critical care time)  Medications Ordered in ED Medications  benazepril (LOTENSIN) tablet 40 mg (has no administration in time range)  escitalopram (LEXAPRO) tablet 20 mg (has no administration in time range)  hydrochlorothiazide (HYDRODIURIL) tablet 25 mg (has no administration in time range)  traZODone (DESYREL) tablet 50 mg (has no administration in time range)     Initial Impression / Assessment and Plan / ED Course  I have reviewed the triage vital signs and the nursing notes.  Pertinent labs & imaging results that were available during my care of the patient were reviewed by me and considered in my medical decision making (see chart for details).  Clinical Course as of Nov 17 2120  Mon Nov 18, 2018  1919 Pt thinks she is in a [employer], does not know the year.  Answers tangentially   [JK]    Clinical Course User Index [JK] Dorie Rank, MD     Patient has remained calm and appropriate in the ED.  She however has been  confused.  CT scan does not show any evidence of acute abnormalities.  No evidence of any atrophy.  Laboratory tests are otherwise unremarkable.  Patient is medically cleared for psychiatric evaluation.  Final Clinical Impressions(s) / ED Diagnoses   Final diagnoses:  Confusion  Aggressive behavior    ED Discharge Orders    None       Dorie Rank, MD 11/18/18 2123

## 2018-11-18 NOTE — ED Triage Notes (Signed)
Pt come in with [facility] and [facility] voluntarily medical clearance for confusion since last Friday and took a knife and cut a whole in her mattress. Pt was seen here last week for aggressive behavior. Pt can't tell you the month, year, president. Pt didn't even know who her daughter was earlier today and thought her grandson was her nephew to per [facility]. [facility] believes pt has dementia.

## 2018-11-19 ENCOUNTER — Encounter (HOSPITAL_COMMUNITY): Payer: Self-pay | Admitting: Registered Nurse

## 2018-11-19 DIAGNOSIS — F0391 Unspecified dementia with behavioral disturbance: Secondary | ICD-10-CM | POA: Diagnosis not present

## 2018-11-19 LAB — SARS CORONAVIRUS 2 (TAT 6-24 HRS): SARS Coronavirus 2: NEGATIVE

## 2018-11-19 NOTE — ED Notes (Signed)
Pt resting quietly.

## 2018-11-19 NOTE — BH Assessment (Signed)
[facility] Assessment Progress Note   This patient has been accepted to [facility] for gero-psych admission.

## 2018-11-19 NOTE — Progress Notes (Signed)
Patient meets criteria for inpatient treatment. No appropriate or available beds at [facility]. CSW faxed referrals to the following facilities for review:  [facility] Details     [facility]   [facility]    [facility]   CSW will continue to seek bed placement.   Golden Circle, LCSW Transitions of Care Department [facility] ED [phone]

## 2018-11-19 NOTE — BH Assessment (Signed)
Tele Assessment Note   Patient Name: Sherry Hicks MRN: [id number] Referring Physician: Dr. Linwood DibblesJon Knapp [facility] Location of Provider: Behavioral Health TTS Department  Sherry Hicks is an 58 y.o. female presenting to ED for confusion. Patient is currently voluntary. Per EMS, mobile crisis evaluated patient, as patient has become more confused. Patient cut a hole in her mattress at home with a knife and has been acting aggressively. When asked why are you here, patient reported "I didn't have any problems, I didn't have any problems". Patient denied, SI, HI, psychosis and alcohol. Patient denies prior inpatient treatment, self-harming behaviors and past suicide attempts. Patient denied receiving any mental health outpatient treatment. Patient refers to "a lady she knows", maybe referring to her daughter, however patient never referenced daughter or any family members. When asked about daughter, patient stated "I didn't have any problems". Patient was pleasant during assessment. Patient was not able to recall information, patient poor historian.  UDS negative ETOH negative  Collateral Contact: Sherry Hicks, daughter, [phone] home, [phone] cell. Unable reach collateral contact at this time.  PER EDP NOTE 11/18/2018: According to the medical records the patient had a visit with her primary care doctor in May of this year.  During that visit it was reported the patient had a history of inpatient behavioral health treatment in the past.  During that visit the daughter reported that the patient had inappropriate behaviors including misplacing things, rapid mood changes, using profanity and verbal aggression.  She was also not dressing or bathing herself at that time.  Family declined psychiatry referral. [facility].  Medical records indication the patient to not follow-up with an appointment on July 27.  PER TRIAGE NOTE 11/18/2018: Pt come in  with [employer] and [facility] voluntarily medical clearance for confusion since last Friday and took a knife and cut a whole in her mattress. Pt was seen here last week for aggressive behavior. Pt can't tell you the month, year, president. Pt didn't even know who her daughter was earlier today and thought her grandson was her nephew to per mobile crisis. Mobile Crisis believes pt has dementia.   PER TRIAGE NOTE 11/15/2018: *Mobile Crisis arrives with patient. Pt daughter called them because patient was expressing some SI. No plan. Daughter states that patient has been wandering around [place] earlier this week. Pt denies SI/HI. She thinks that she is here for her pregnant daughter. *Pt daughter states that she is taking the patient home because the doctor hasnt seen her yet. Sherry Prestohe states that she isnt waiting any longer. She will bring her back later. I encouraged her to stay and be evaluated, but the patient refused. The patient denies SI at this time.  Diagnosis: hx of dementia  Past Medical History:  Past Medical History:  Diagnosis Date  . Arthritis   . Asthma   . Dementia (HCC)   . Hypertension   . Smoker     Past Surgical History:  Procedure Laterality Date  . ABDOMINAL HYSTERECTOMY    . ANKLE SURGERY    . SALPINGOOPHORECTOMY      Family History:  Family History  Problem Relation Age of Onset  . Hypertension Mother   . Asthma Father   . Cancer Father   . Canavan disease Sister   . Cancer Brother   . Diabetes Maternal Grandmother   . Hypertension Maternal Grandmother   . Asthma Paternal Grandfather   . Cancer Paternal Grandfather   .  Cancer Other   . Diabetes Other     Social History:  reports that she has been smoking. She has never used smokeless tobacco. She reports that she does not drink alcohol or use drugs.  Additional Social History:  Alcohol / Drug Use Pain Medications: see MAR Prescriptions: see MAR Over the Counter: see MAR  CIWA: CIWA-Ar BP: (!)  149/88 Pulse Rate: 70 COWS:    Allergies:  Allergies  Allergen Reactions  . Hydrocodone-Acetaminophen Nausea And Vomiting    Home Medications: (Not in a hospital admission)   OB/GYN Status:  No LMP recorded. Patient has had a hysterectomy.  General Assessment Data Location of Assessment: [facility] TTS Assessment: In system Is this a Tele or Face-to-Face Assessment?: Tele Assessment Is this an Initial Assessment or a Re-assessment for this encounter?: Initial Assessment Patient Accompanied by:: N/A Language Other than English: No Living Arrangements: (unknown) What gender do you identify as?: Female Marital status: (unable to assess) Living Arrangements: (unable to assess) Can pt return to current living arrangement?: (unable to assess) Admission Status: Voluntary Is patient capable of signing voluntary admission?: Yes Referral Source: Self/Family/Friend(and mobile crisis)     Crisis Care Plan Living Arrangements: (unable to assess) Name of Psychiatrist: (none reported ) Name of Therapist: (none reported)  Education Status Is patient currently in school?: No Is the patient employed, unemployed or receiving disability?: (unable to assess)  Risk to self with the past 6 months Suicidal Ideation: No Has patient been a risk to self within the past 6 months prior to admission? : No Suicidal Intent: No Has patient had any suicidal intent within the past 6 months prior to admission? : No Is patient at risk for suicide?: No Suicidal Plan?: No Has patient had any suicidal plan within the past 6 months prior to admission? : No Access to Means: No What has been your use of drugs/alcohol within the last 12 months?: (denied) Previous Attempts/Gestures: No How many times?: (0) Other Self Harm Risks: (denied) Triggers for Past Attempts: (nl/a) Intentional Self Injurious Behavior: None Family Suicide History: No Recent stressful life event(s): (memory) Persecutory  voices/beliefs?: No Depression: No Depression Symptoms: (denied) Substance abuse history and/or treatment for substance abuse?: No Suicide prevention information given to non-admitted patients: Not applicable  Risk to Others within the past 6 months Homicidal Ideation: No Does patient have any lifetime risk of violence toward others beyond the six months prior to admission? : No Thoughts of Harm to Others: No Current Homicidal Intent: No Current Homicidal Plan: No Access to Homicidal Means: No Identified Victim: (n/a) History of harm to others?: No Assessment of Violence: None Noted Violent Behavior Description: (ripped mattress with knife) Does patient have access to weapons?: Yes (Comment)(knives) Criminal Charges Pending?: No Does patient have a court date: No Is patient on probation?: No  Psychosis Hallucinations: None noted Delusions: None noted  Mental Status Report Appearance/Hygiene: Unable to Assess Eye Contact: Unable to Assess Motor Activity: Unable to assess Speech: Soft, Slow Level of Consciousness: Alert Mood: Sad, Pleasant Affect: Appropriate to circumstance, Sad Anxiety Level: Moderate Thought Processes: Relevant Judgement: Impaired Orientation: Not oriented Obsessive Compulsive Thoughts/Behaviors: None  Cognitive Functioning Concentration: Unable to Assess Memory: Recent Impaired, Remote Impaired Is patient IDD: No Insight: Poor Impulse Control: Poor Appetite: Fair Have you had any weight changes? : No Change Sleep: No Change Total Hours of Sleep: ("I had no problems") Vegetative Symptoms: None  ADLScreening [facility] Assessment Services) Patient's cognitive ability adequate to safely complete daily activities?:  Yes Patient able to express need for assistance with ADLs?: Yes Independently performs ADLs?: Yes (appropriate for developmental age)  Prior Inpatient Therapy Prior Inpatient Therapy: No  Prior Outpatient Therapy Prior Outpatient  Therapy: No Does patient have an ACCT team?: No Does patient have Intensive In-House Services?  : No Does patient have [facility] services? : No Does patient have P4CC services?: No  ADL Screening (condition at time of admission) Patient's cognitive ability adequate to safely complete daily activities?: Yes Patient able to express need for assistance with ADLs?: Yes Independently performs ADLs?: Yes (appropriate for developmental age)  Advance Directives (For Healthcare) Does Patient Have a Medical Advance Directive?: No   Disposition:  Disposition Initial Assessment Completed for this Encounter: Yes  Nira Conn, NP, recommends inpatient geropsychiatry. TTS to secure placement.   This service was provided via telemedicine using a 2-way, interactive audio and video technology.  Names of all persons participating in this telemedicine service and their role in this encounter. Name: Leota Maka Role: Patient  Name: Al Corpus Role: TTS Clinician  Name:  Role:   Name:  Role:     Burnetta Sabin 11/19/2018 4:38 AM

## 2018-11-19 NOTE — Progress Notes (Addendum)
This patient has been accepted to [facility] for gero-psych admission.  Bed: [address]  RN Call for Report: [phone]  This patient is currently voluntary and will require [place] transportation.  Stephanie Acre, LCSW-A Clinical Social Worker

## 2018-11-19 NOTE — Progress Notes (Signed)
Per Lindon Romp, NP, this pt requires psychiatric hospitalization at this time. [place] calls from [facility] to report that pt has been accepted to their facility by Dr. Solon Palm. EDP Dr. Virgel Manifold agrees to IVC patient due to patient's confusion.  Pt's nurse has been notified, and agrees to call report to [phone].  Pt is to be transported via [facility].  Golden Circle, LCSW Transitions of Care Department [facility] ED [phone]

## 2018-11-19 NOTE — ED Notes (Signed)
Pt provided breakfast tray.

## 2018-11-20 DIAGNOSIS — F339 Major depressive disorder, recurrent, unspecified: Secondary | ICD-10-CM | POA: Diagnosis not present

## 2018-11-20 DIAGNOSIS — J45909 Unspecified asthma, uncomplicated: Secondary | ICD-10-CM | POA: Diagnosis present

## 2018-11-20 DIAGNOSIS — K5900 Constipation, unspecified: Secondary | ICD-10-CM | POA: Diagnosis present

## 2018-11-20 DIAGNOSIS — F17200 Nicotine dependence, unspecified, uncomplicated: Secondary | ICD-10-CM | POA: Diagnosis not present

## 2018-11-20 DIAGNOSIS — M1990 Unspecified osteoarthritis, unspecified site: Secondary | ICD-10-CM | POA: Diagnosis present

## 2018-11-20 DIAGNOSIS — F0281 Dementia in other diseases classified elsewhere with behavioral disturbance: Secondary | ICD-10-CM | POA: Diagnosis not present

## 2018-11-20 DIAGNOSIS — Z20828 Contact with and (suspected) exposure to other viral communicable diseases: Secondary | ICD-10-CM | POA: Diagnosis not present

## 2018-11-20 DIAGNOSIS — I10 Essential (primary) hypertension: Secondary | ICD-10-CM | POA: Diagnosis present

## 2018-11-20 DIAGNOSIS — G4700 Insomnia, unspecified: Secondary | ICD-10-CM | POA: Diagnosis present

## 2018-11-20 DIAGNOSIS — F0391 Unspecified dementia with behavioral disturbance: Secondary | ICD-10-CM | POA: Diagnosis present

## 2018-11-20 DIAGNOSIS — Z046 Encounter for general psychiatric examination, requested by authority: Secondary | ICD-10-CM | POA: Diagnosis not present

## 2018-11-20 NOTE — Progress Notes (Signed)
CSW spoke with patient's daughter Sherry Hicks and informed her that patient is going to [facility] via [employer]. Patient's daughter reports she is upset patient was not taken care of while in the hospital (did not receive a bath or food per her report). Patient's daughter reports that she is also upset that patient is [facility] and wants her to come back to the hospital to be placed somewhere else. CSW explained that the sheriffs have already came to pick up patient and at this time we can not have them bring patient back to the hospital. CSW explained patient was placed under IVC due to her not being ableto make her own decisions and deemed that she needed inpatient geropsych treatment. CSW also explained that the process of finding inpatient treatment is to sent the patient to the closest most appropriate facility. Patient's daughter stated "well I am her POA and I want her to come back." Patient's daughter reports she will be making a complaint against the hospital. Patient's daughter began using profanity towards CSW at the end of the conversation and hung up.   Golden Circle, LCSW Transitions of Care Department [facility] ED [phone]

## 2018-11-20 NOTE — ED Notes (Signed)
Sheriff called for transfer- message left, pt IVC accepted to [facility]

## 2018-11-20 NOTE — ED Notes (Signed)
Sheriff on unit to transfer pt to [facility] per MD order. Pt Personal property given to sheriff for transfer. Transfer documentation given to sheriff. Off unit in law enforcement custody.

## 2018-11-20 NOTE — ED Notes (Signed)
Pt requesting her daughter to be notified of her transfer, Water quality scientist, made aware; reports will reach out to pt daughter per pt request.

## 2018-11-20 NOTE — ED Notes (Signed)
Received follow up  call from sheriff, informs nurse will call when 30 mins away

## 2018-11-20 NOTE — ED Notes (Signed)
Belongings placed locker 30

## 2018-12-12 DIAGNOSIS — F0390 Unspecified dementia without behavioral disturbance: Secondary | ICD-10-CM | POA: Diagnosis not present

## 2018-12-12 DIAGNOSIS — Z6830 Body mass index (BMI) 30.0-30.9, adult: Secondary | ICD-10-CM | POA: Diagnosis not present

## 2018-12-12 DIAGNOSIS — E669 Obesity, unspecified: Secondary | ICD-10-CM | POA: Diagnosis not present

## 2018-12-12 DIAGNOSIS — I10 Essential (primary) hypertension: Secondary | ICD-10-CM | POA: Diagnosis not present

## 2018-12-12 DIAGNOSIS — Z1159 Encounter for screening for other viral diseases: Secondary | ICD-10-CM | POA: Diagnosis not present

## 2018-12-12 DIAGNOSIS — Z79899 Other long term (current) drug therapy: Secondary | ICD-10-CM | POA: Diagnosis not present

## 2018-12-12 DIAGNOSIS — F329 Major depressive disorder, single episode, unspecified: Secondary | ICD-10-CM | POA: Diagnosis not present

## 2018-12-12 DIAGNOSIS — Z0000 Encounter for general adult medical examination without abnormal findings: Secondary | ICD-10-CM | POA: Diagnosis not present

## 2018-12-27 DIAGNOSIS — Z79899 Other long term (current) drug therapy: Secondary | ICD-10-CM | POA: Diagnosis not present

## 2018-12-30 DIAGNOSIS — F0390 Unspecified dementia without behavioral disturbance: Secondary | ICD-10-CM | POA: Diagnosis not present

## 2018-12-30 DIAGNOSIS — Z1231 Encounter for screening mammogram for malignant neoplasm of breast: Secondary | ICD-10-CM | POA: Diagnosis not present

## 2018-12-30 DIAGNOSIS — F330 Major depressive disorder, recurrent, mild: Secondary | ICD-10-CM | POA: Diagnosis not present

## 2018-12-30 DIAGNOSIS — Z7189 Other specified counseling: Secondary | ICD-10-CM | POA: Diagnosis not present

## 2018-12-30 DIAGNOSIS — G4700 Insomnia, unspecified: Secondary | ICD-10-CM | POA: Diagnosis not present

## 2018-12-30 DIAGNOSIS — E46 Unspecified protein-calorie malnutrition: Secondary | ICD-10-CM | POA: Diagnosis not present

## 2018-12-30 DIAGNOSIS — Z6831 Body mass index (BMI) 31.0-31.9, adult: Secondary | ICD-10-CM | POA: Diagnosis not present

## 2018-12-30 DIAGNOSIS — Z0000 Encounter for general adult medical examination without abnormal findings: Secondary | ICD-10-CM | POA: Diagnosis not present

## 2018-12-30 DIAGNOSIS — Z0001 Encounter for general adult medical examination with abnormal findings: Secondary | ICD-10-CM | POA: Diagnosis not present

## 2018-12-30 DIAGNOSIS — I70203 Unspecified atherosclerosis of native arteries of extremities, bilateral legs: Secondary | ICD-10-CM | POA: Diagnosis not present

## 2018-12-30 DIAGNOSIS — Z008 Encounter for other general examination: Secondary | ICD-10-CM | POA: Diagnosis not present

## 2018-12-30 DIAGNOSIS — E669 Obesity, unspecified: Secondary | ICD-10-CM | POA: Diagnosis not present

## 2019-01-21 DIAGNOSIS — F0281 Dementia in other diseases classified elsewhere with behavioral disturbance: Secondary | ICD-10-CM | POA: Diagnosis not present

## 2019-01-21 DIAGNOSIS — F69 Unspecified disorder of adult personality and behavior: Secondary | ICD-10-CM | POA: Diagnosis not present

## 2019-01-21 DIAGNOSIS — G309 Alzheimer's disease, unspecified: Secondary | ICD-10-CM | POA: Diagnosis not present

## 2019-01-21 DIAGNOSIS — R413 Other amnesia: Secondary | ICD-10-CM | POA: Diagnosis not present

## 2019-02-28 ENCOUNTER — Telehealth: Payer: Self-pay | Admitting: Internal Medicine

## 2019-02-28 NOTE — Telephone Encounter (Signed)
Appt scheduled with [facility] for 03-10-19 at 1:00pm.

## 2019-03-10 ENCOUNTER — Other Ambulatory Visit: Payer: Medicare Other | Admitting: Internal Medicine

## 2019-03-10 ENCOUNTER — Telehealth: Payer: Self-pay | Admitting: Internal Medicine

## 2019-03-10 ENCOUNTER — Other Ambulatory Visit: Payer: Self-pay

## 2019-03-10 NOTE — Progress Notes (Unsigned)
    Therapist, nutritional Palliative Care Consult Note Telephone: [phone]  Fax: [phone]  PATIENT NAME: Sherry Hicks DOB: [birth year] MRN: [id number]  PRIMARY CARE PROVIDER:   Bing Neighbors, FNP  REFERRING PROVIDER:  Bing Neighbors, FNP [address] [place],  [place] [address]  RESPONSIBLE PARTY:     ASSESSMENT:        RECOMMENDATIONS and PLAN:  1.  I spent *** minutes providing this consultation,  from *** to ***. More than 50% of the time in this consultation was spent coordinating communication.   HISTORY OF PRESENT ILLNESS:  Sherry Hicks is a 59 y.o. year old female with multiple medical problems including ***. Palliative Care was asked to help address goals of care.   CODE STATUS:   PPS: 0% HOSPICE ELIGIBILITY/DIAGNOSIS: TBD  PAST MEDICAL HISTORY:  Past Medical History:  Diagnosis Date  . Arthritis   . Asthma   . Dementia (HCC)   . Hypertension   . Smoker     SOCIAL HX:  Social History   Tobacco Use  . Smoking status: Current Every Day Smoker  . Smokeless tobacco: Never Used  Substance Use Topics  . Alcohol use: No    ALLERGIES:  Allergies  Allergen Reactions  . Hydrocodone-Acetaminophen Nausea And Vomiting     PERTINENT MEDICATIONS:  Outpatient Encounter Medications as of 03/10/2019  Medication Sig  . aspirin 81 MG tablet Take 1 tablet (81 mg total) by mouth 2 (two) times daily. (Patient not taking: Reported on 11/19/2018)  . benazepril (LOTENSIN) 40 MG tablet Take 1 tablet (40 mg total) by mouth daily.  . diclofenac sodium (VOLTAREN) 1 % GEL Apply 2 g topically 4 (four) times daily. (Patient not taking: Reported on 11/19/2018)  . escitalopram (LEXAPRO) 20 MG tablet Take 1 tablet (20 mg total) by mouth daily.  . hydrochlorothiazide (HYDRODIURIL) 25 MG tablet Take 1 tablet (25 mg total) by mouth daily. (Patient not taking: Reported on 11/19/2018)  . polyethylene glycol powder (GLYCOLAX/MIRALAX) 17 GM/SCOOP  powder Take 17 g by mouth daily. (Patient not taking: Reported on 11/19/2018)  . traZODone (DESYREL) 50 MG tablet Take 1 tablet (50 mg total) by mouth at bedtime.   No facility-administered encounter medications on file as of 03/10/2019.    PHYSICAL EXAM:   General: NAD, frail appearing, thin Cardiovascular: regular rate and rhythm Pulmonary: clear ant fields Abdomen: soft, nontender, + bowel sounds GU: no suprapubic tenderness Extremities: no edema, no joint deformities Skin: no rashes Neurological: Weakness but otherwise nonfocal  Anselm Lis, NP

## 2019-03-10 NOTE — Telephone Encounter (Signed)
12:45pm: Called to confirm appointment for 1pm. Daughter stated she forgot; requested reschedule.   Appointment rescheduled for Friday Jan 29th at [place] Close NP-C [phone]

## 2019-03-14 ENCOUNTER — Encounter: Payer: Self-pay | Admitting: Internal Medicine

## 2019-03-14 ENCOUNTER — Other Ambulatory Visit: Payer: Medicare Other | Admitting: Internal Medicine

## 2019-03-14 ENCOUNTER — Other Ambulatory Visit: Payer: Self-pay

## 2019-03-14 DIAGNOSIS — Z515 Encounter for palliative care: Secondary | ICD-10-CM

## 2019-03-14 NOTE — Progress Notes (Signed)
03/14/2019 8:45am  TC to patient's PCG daughter Mileigh Tilley, to confirm our rescheduled appointment for this am at 9.  Jaquail asked about Palliative Care Services. I explained that we provide the ability to complete Advanced Care Directives, assist with exploration of community resources, and in conjunction with her PCP, assist with symptom management.   Jaquail asked if our agency actually provided home health aides. Unfortunately not. Claudina Lick mentioned she did not want me to do a home visit, and waste my time.  We were then cut off; I was unsuccessful in being able to call back. I left Jaquail a message with my contact information, and invited her to call me back to reschedule, if she wished.   Holly Bodily NP-C [phone]

## 2019-07-01 ENCOUNTER — Ambulatory Visit (INDEPENDENT_AMBULATORY_CARE_PROVIDER_SITE_OTHER): Payer: Medicare Other | Admitting: Family Medicine

## 2019-07-01 ENCOUNTER — Encounter: Payer: Self-pay | Admitting: Family Medicine

## 2019-07-01 ENCOUNTER — Other Ambulatory Visit: Payer: Self-pay

## 2019-07-01 VITALS — BP 130/70 | Wt 125.1 lb

## 2019-07-01 DIAGNOSIS — F0280 Dementia in other diseases classified elsewhere without behavioral disturbance: Secondary | ICD-10-CM

## 2019-07-01 DIAGNOSIS — G300 Alzheimer's disease with early onset: Secondary | ICD-10-CM

## 2019-07-01 DIAGNOSIS — R634 Abnormal weight loss: Secondary | ICD-10-CM

## 2019-07-01 DIAGNOSIS — E870 Hyperosmolality and hypernatremia: Secondary | ICD-10-CM

## 2019-07-01 DIAGNOSIS — I10 Essential (primary) hypertension: Secondary | ICD-10-CM

## 2019-07-01 MED ORDER — DONEPEZIL HCL 5 MG PO TABS: 5.0000 mg | ORAL_TABLET | Freq: Every day | ORAL | 3 refills | Status: DC

## 2019-07-01 NOTE — Progress Notes (Signed)
    SUBJECTIVE:   CHIEF COMPLAINT / HPI:   New Patient Ms. Sammarco is a new patient that presents to the office with her daughter to establish care. Her daughter is POA and her caregiver d/t early onset dementia. The daughter provided the history and HPI. Her concerns are listed below.   Early onset dementia Diagnosed in October 2020 at [facility] in [place] [address], [place]. Daughter took her in to be evaluated after several months of sudden odd behavior. Prior to she was completely healthy. She feels as if this started last february after a fall. She had normal head CT imaging 11/18/2019. Daughter mentions an episode last mother's day 1 year ago where the patient looked as if she had a seizure v. Stroke. She is currently taking donepezil and trazadone to sleep. The daughter would like more help with resources and taking care of her mother. Her mother is solely dependent on her for ADLs. This has become stressful as she has a new baby of her own. Patient with family hx of dementia (mom and paternal grandmother with dementia) onset unknown.  Weight loss Decline in weight over the last year. Today she is 125lbs. In 2018 she was 262. In December of 2020 she was 172. Daughter feels as if she is a picky eater. Will eat fruit mostly. She supplements with ensure. Daughter denies the patient has sx of odynophagia or B symptoms.   HTN No current medications.  PERTINENT  PMH / PSH: TAH w/ BSO, hx of depression, hx of obesity, hx of tobacco use  OBJECTIVE:   BP 130/70   Wt 125 lb 2 oz (56.8 kg)   BMI 20.20 kg/m   General: Very thin but appears well and well groomed, no acute distress. Age appropriate. Cardiac: RRR, normal heart sounds, no murmurs Respiratory: CTAB, normal effort Abdomen: soft, nontender, nondistended Extremities: No edema or cyanosis. Skin: Warm and dry, no rashes noted Neuro: alert and oriented to self, person, and place but not time. PERRL. Unable to follow up commands for  full neuro exam.  Psych: Appears to not have insight to situation   Office Visit from 07/01/2019 in [facility]  PHQ-2 Total Score  0     ASSESSMENT/PLAN:   Early onset Alzheimer's dementia without behavioral disturbance [facility]) Patient is completely depended on her daughter as she does not have insight to her current state. Daughter needs assistance in the home and resources to help with her mother's condition. Will refer to geri clinic. Home health order placed for nursing. CCM referral placed. Pt. Daughter amendable to all. Refilled donepezil. I would suggest removing medication in the future if not helpful.   Weight loss, unintentional 47 lbs wt. Loss in 5 months. CBC unremarkable. CMP with elevated sodium. Likely due to dehydration; patient is probably not drinking enough because of her mental status. No odynophagia. High suspicion for significant appetite decline d/t dementia state. Consider further work up for other medical causes in subsequent visits. Consider obtaining urine osmolality.   HYPERTENSION, BENIGN SYSTEMIC BP at goal. Would not restart antihypertensives at this time. ASVD risk 6.2% LDL-c 269. Consider starting moderate-intensity statin.    [name], DO [facility]

## 2019-07-01 NOTE — Patient Instructions (Addendum)
It was very nice to meet you today. Please enjoy the rest of your week.  Today you were seen for a new patient visit.  Follow up in 1 week to discuss lab work and follow up on health maintenance concerns.   Bring all records to next appointment  Please call the clinic at [phone] if you have any concerns. It was our pleasure to serve you.

## 2019-07-02 LAB — COMPREHENSIVE METABOLIC PANEL
ALT: 13 IU/L (ref 0–32)
AST: 18 IU/L (ref 0–40)
Albumin/Globulin Ratio: 2.1 (ref 1.2–2.2)
Albumin: 4.4 g/dL (ref 3.8–4.9)
Alkaline Phosphatase: 54 IU/L (ref 48–121)
BUN/Creatinine Ratio: 14 (ref 9–23)
BUN: 10 mg/dL (ref 6–24)
Bilirubin Total: 0.5 mg/dL (ref 0.0–1.2)
CO2: 26 mmol/L (ref 20–29)
Calcium: 10 mg/dL (ref 8.7–10.2)
Chloride: 106 mmol/L (ref 96–106)
Creatinine, Ser: 0.74 mg/dL (ref 0.57–1.00)
GFR calc Af Amer: 103 mL/min/{1.73_m2} (ref >59)
GFR calc non Af Amer: 90 mL/min/{1.73_m2} (ref >59)
Globulin, Total: 2.1 g/dL (ref 1.5–4.5)
Glucose: 92 mg/dL (ref 65–99)
Potassium: 4 mmol/L (ref 3.5–5.2)
Sodium: 147 mmol/L — ABNORMAL HIGH (ref 134–144)
Total Protein: 6.5 g/dL (ref 6.0–8.5)

## 2019-07-02 LAB — LIPID PANEL
Chol/HDL Ratio: 4.9 ratio — ABNORMAL HIGH (ref 0.0–4.4)
Cholesterol, Total: 346 mg/dL — ABNORMAL HIGH (ref 100–199)
HDL: 71 mg/dL (ref >39)
LDL Chol Calc (NIH): 269 mg/dL — ABNORMAL HIGH (ref 0–99)
Triglycerides: 57 mg/dL (ref 0–149)
VLDL Cholesterol Cal: 6 mg/dL (ref 5–40)

## 2019-07-02 LAB — CBC
Hematocrit: 43.3 % (ref 34.0–46.6)
Hemoglobin: 14.2 g/dL (ref 11.1–15.9)
MCH: 31.1 pg (ref 26.6–33.0)
MCHC: 32.8 g/dL (ref 31.5–35.7)
MCV: 95 fL (ref 79–97)
Platelets: 319 10*3/uL (ref 150–450)
RBC: 4.56 x10E6/uL (ref 3.77–5.28)
RDW: 13.7 % (ref 11.7–15.4)
WBC: 4.4 10*3/uL (ref 3.4–10.8)

## 2019-07-06 DIAGNOSIS — R4182 Altered mental status, unspecified: Secondary | ICD-10-CM | POA: Insufficient documentation

## 2019-07-06 DIAGNOSIS — E870 Hyperosmolality and hypernatremia: Secondary | ICD-10-CM | POA: Insufficient documentation

## 2019-07-06 DIAGNOSIS — R634 Abnormal weight loss: Secondary | ICD-10-CM | POA: Insufficient documentation

## 2019-07-06 NOTE — Assessment & Plan Note (Addendum)
BP at goal. Would not restart antihypertensives at this time. ASVD risk 6.2% LDL-c 269. Consider starting moderate-intensity statin.

## 2019-07-06 NOTE — Assessment & Plan Note (Addendum)
47 lbs wt. Loss in 5 months. CBC unremarkable. CMP with elevated sodium. Likely due to dehydration; patient is probably not drinking enough because of her mental status. No odynophagia. High suspicion for significant appetite decline d/t dementia state. Consider further work up for other medical causes in subsequent visits. Consider obtaining urine osmolality.

## 2019-07-06 NOTE — Assessment & Plan Note (Signed)
Patient is completely depended on her daughter as she does not have insight to her current state. Daughter needs assistance in the home and resources to help with her mother's condition. Will refer to geri clinic. Home health order placed for nursing. CCM referral placed. Pt. Daughter amendable to all. Refilled donepezil. I would suggest removing medication in the future if not helpful.

## 2019-07-06 NOTE — Progress Notes (Signed)
Patient has slightly elevated sodium which is likely due to dehydration. Would benefit from increased fluid intake. She has high cholesterol and may benefit from starting statin medication for this. Will discuss at follow up visits. Please let the patient's daughter know.

## 2019-07-07 ENCOUNTER — Telehealth: Payer: Self-pay

## 2019-07-07 NOTE — Telephone Encounter (Signed)
Patient informed.  .Michelle R Simpson, CMA  

## 2019-07-08 ENCOUNTER — Other Ambulatory Visit: Payer: Self-pay

## 2019-07-08 ENCOUNTER — Ambulatory Visit (INDEPENDENT_AMBULATORY_CARE_PROVIDER_SITE_OTHER): Payer: Medicare Other | Admitting: Family Medicine

## 2019-07-08 ENCOUNTER — Encounter: Payer: Self-pay | Admitting: Licensed Clinical Social Worker

## 2019-07-08 ENCOUNTER — Encounter: Payer: Self-pay | Admitting: Family Medicine

## 2019-07-08 ENCOUNTER — Ambulatory Visit: Payer: Medicare Other | Admitting: Licensed Clinical Social Worker

## 2019-07-08 VITALS — BP 134/72 | HR 100 | Ht 66.0 in | Wt 120.0 lb

## 2019-07-08 DIAGNOSIS — F0280 Dementia in other diseases classified elsewhere without behavioral disturbance: Secondary | ICD-10-CM

## 2019-07-08 DIAGNOSIS — F0391 Unspecified dementia with behavioral disturbance: Secondary | ICD-10-CM

## 2019-07-08 DIAGNOSIS — Z636 Dependent relative needing care at home: Secondary | ICD-10-CM

## 2019-07-08 DIAGNOSIS — G300 Alzheimer's disease with early onset: Secondary | ICD-10-CM

## 2019-07-08 DIAGNOSIS — R4182 Altered mental status, unspecified: Secondary | ICD-10-CM

## 2019-07-08 DIAGNOSIS — R634 Abnormal weight loss: Secondary | ICD-10-CM

## 2019-07-08 DIAGNOSIS — Z741 Need for assistance with personal care: Secondary | ICD-10-CM

## 2019-07-08 NOTE — Progress Notes (Signed)
SUBJECTIVE:   CHIEF COMPLAINT / HPI:   Dementia Patient presenting with daughter who is POA for f/u. Daughter needed assistance in the home with care of mother, she is working with CSW to help with this.   Seen by PCP on 5/18. At that time patient was referred to [facility] clinic for Alzheimer's dementia. Diagnosed in October 2020 at [facility] and was also seen at [facility]. Currently on donepezil. Patient's daughter reports she has had incontinence since starting medications. She is no longer able to tell her she is hungry. Has to help with eating. Unable to bathe herself or dress herself. Needs daughter to assist with all of this. Daughter says that in the past [facility] has "thrown medications at her" which made her worse. Right now only on donepezil and trazodone. Medication list shows lisinopril 40mg , escitalopram 10mg , melatonin 6mg , trazodone 25mg , and amlodipine 5mg , psyllium.   Currently, she is able to communicate. She does have lucid episodes where she seems back to herself. Can't do ADLs independently. Does not have interest in doing other activities. Likes to watch tv and curse at the tv. Normally is incontinent, but some days she will say she needs to use bathroom. Drinks lots of water, reports change in appetite.   PERTINENT  PMH / PSH: HTN, alzheimer's, weight loss  OBJECTIVE:   BP 134/72   Pulse 100   Ht 5\' 6"  (1.676 m)   Wt 120 lb (54.4 kg)   SpO2 100%   BMI 19.37 kg/m   Gen: awake and alert, thin, NAD, smiling and pleasant Cardio: Regular rate Resp: speaking full sentences, no increased WOB Ext: no edema  ASSESSMENT/PLAN:   Early onset Alzheimer's dementia without behavioral disturbance (HCC) Patient with early onset dementia.  Unclear etiology.  Seems to have had work-up at other hospital facilities. Initial consult with [facility] neurology on 01/21/2019.  Plan from neurology was MRI brain as well as psychiatry referral.  Advised to follow-up in 2 months.   MRI brain showing age advanced general atrophy that has developed since 2007 head CT.  No focal or reversible findings seen.  Of note does have multiple psychiatric inpatient hospitalizations so can consider psychiatric etiology. Would recommend further workup given age of onset and rapid progression of symptoms, sees to have gone from being independent to no ADLs since October 2020. Will obtain RPR and HIV to rule out these causes. Patient's daughter will bring outside records for PCP to review. I recommend PCP obtain B12 and TSH if not already checked. Patient's daughter has requested neurology referral in [place], referral has been placed. I also recommended psych eval, list of psychiatrists in the area given in AVS. CCM is already consulted, appreciate their assistance with patient care. Would consider genetics referral for daughter, but will defer this to PCP. I advised PCP f/u in 2 weeks. Patient would benefit from [facility] clinic referral, per PCP note this referral process was initiated by PCP. We were able to give patient initial Geri clinic paperwork in office today which daughter will complete.   Weight loss, unintentional Not discussed with daughter today however noted from previous note. I do not see routine cancer screenings on file so would consider ensuring these are updated, will defer to PCP as she will obtain records from patient's former PCP. Patient unable to feed herself, encouraged hand feeding patient. Follow up with PCP  Discussed patient with Dr. [facility]. Epic message sent to PCP to close loop of communication  Caroline More, [place]

## 2019-07-08 NOTE — Assessment & Plan Note (Signed)
Not discussed with daughter today however noted from previous note. I do not see routine cancer screenings on file so would consider ensuring these are updated, will defer to PCP as she will obtain records from patient's former PCP. Patient unable to feed herself, encouraged hand feeding patient. Follow up with PCP

## 2019-07-08 NOTE — Assessment & Plan Note (Addendum)
Patient with early onset dementia.  Unclear etiology.  Seems to have had work-up at other hospital facilities. Initial consult with [facility] neurology on 01/21/2019.  Plan from neurology was MRI brain as well as psychiatry referral.  Advised to follow-up in 2 months.  MRI brain showing age advanced general atrophy that has developed since 2007 head CT.  No focal or reversible findings seen.  Of note does have multiple psychiatric inpatient hospitalizations so can consider psychiatric etiology. Would recommend further workup given age of onset and rapid progression of symptoms, sees to have gone from being independent to no ADLs since October 2020. Will obtain RPR and HIV to rule out these causes. Patient's daughter will bring outside records for PCP to review. I recommend PCP obtain B12 and TSH if not already checked. Patient's daughter has requested neurology referral in [place], referral has been placed. I also recommended psych eval, list of psychiatrists in the area given in AVS. CCM is already consulted, appreciate their assistance with patient care. Would consider genetics referral for daughter, but will defer this to PCP. I advised PCP f/u in 2 weeks. Patient would benefit from [facility] clinic referral, per PCP note this referral process was initiated by PCP. We were able to give patient initial Geri clinic paperwork in office today which daughter will complete.

## 2019-07-08 NOTE — Patient Instructions (Addendum)
For your mother's dementia 1. Please follow up with neurology.  If you do not desire to go back to [facility] please let me know and I am happy to place a referral for neurologist 2.  Please see psychiatry, I have listed some offices 3.  I will obtain lab work today.  We will call you with the results of this    Therapy and Counseling Resources Most providers on this list will take Medicaid. Patients with commercial insurance or Medicare should contact their insurance company to get a list of in network providers.  [facility]  [address] [place], [place] [address]      [phone]  [facility] [address]  [place], [place] [address] [phone]  [facility] [address]  [place], [place] [address]       [phone]      [facility] [address] [place], [place] [phone]  [facility]:  [address] [place] [phone]  [facility]:  [address] [place], [place] [phone]  [facility] (under & uninsured) [address]   [place] [phone]    [email or web address]    [facility] [place] -[address]     Phone:  [phone]     [facility]-  [address]  [phone]   [facility] [address]      [place], [place] [phone]  [facility]: [address] [place], [place], [place]  [phone]   [facility] (Spanish therapist) [address] [place]  [address]   [place] [address]    [phone]    [facility]   [address],  [place], [place]  [phone]   [facility]  [address] [place]  [phone]  [facility]  [address] [place], [place]        [phone]  [facility] under & uninsured [place], To schedule an  appointment call [phone]- [phone] [address], [place] [phone]):  Macario Golds from 8 AM - 3 PM Moving June 1 to [employer] at [facility] [address]  [facility] [address],  (Spanish)   [address], [place]: [phone]) 8:30 - 12; 1 - 2:30  [facility] [employer],  [address] [place], [place]    ([phone]):8:30 - 12; 2 - 3PM  [facility],  [address]  [place]; [phone]):   Mon - Fri 8 AM - 5 PM  [facility] [address] [place]  MWF 12:30 to 3:00 or call to schedule an appointment  [phone]  Specific Provider options Psychology Today  [email or web address]://www.psychologytoday.com/us 1. click on find a therapist  2. enter your zip code 3. left side and select or tailor a therapist for your specific need.   [facility] Provider Directory [email or web address]  (Medicaid)   Follow all drop down to find a provider  Social Support program [facility] [place] [phone] or [employer] [address], [place], [place] Recovery support and educational   In home counseling [facility] Telephone: [phone]  office in [facility] [email or web address]   Does not take reg. Medicaid or Medicare private insurance [place], [place] health  Choice, [facility], Humana, [place], [employer], [place] Health Choice  24- Hour Availability:  . [place] or [phone]  . [facility] of the [employer] [phone]  [facility] Crisis Service  [phone]   . [place]  [phone] (after hours)  . Therapeutic Alternative/Mobile Crisis   [phone]  . [place] National Suicide Hotline  407-413-3859 ([place])  . Call 911 or go to emergency room  . [employer]  [phone]);  [place] and [employer]   . [facility] ACC[facility]   [phone]); [place], [place], [place], [place], [place], [place], [place]  .

## 2019-07-08 NOTE — Chronic Care Management (AMB) (Signed)
Care Management   Clinical Social Work initial Note  07/08/2019 Name: SARAGRACE SELKE MRN: [id number] DOB: 07-Jun-1960 The Care Management team was consulted by PCP to assist the patient with [facility] , Level of Care Concerns. Dementia and Caregiver Support PATSY ZARAGOZA is a 59 y.o. year old female who sees [name], Simone, DO for primary care. LCSW met with Damian Leavell and daughter Luanna Cole during office today to introduce self, assess needs and barriers to care.   Assessment: Patient is quite, daughter is providing all information and engaged in conversation.    Review of patient status, including review of consultants reports, relevant laboratory and other test results, and collaboration with appropriate care team members and the patient's provider was performed as part of comprehensive patient evaluation and provision of chronic care management services.    Advance Directive Status: NPOA paper work provided. Copy given to front office to scan into chart. SDOH (Social Determinants of Health) assessments performed: No: needs identified today Goals Addressed            This Visit's Progress   . Care Giver support and resources       CARE PLAN ENTRY (see longitudinal plan of care for additional care plan information) Current Barriers:  . Patient with Dementia needs community resources for caregiver  . Acknowledges deficits and needs support, education and care coordination in order to meet this unmet need  Clinical Goal(s) Over the next 30 days, Provide patient's daughter with resources and support to assist with meeting need Interventions provided by LCSW:  . Assessed patient's care coordination needs and discussed ongoing care management follow up  . Provided patient's daughter with educational information : Caring for Person with Alzheimer's Disease, EMMI education on Demential Caregiver as well as NC360 Referral to Alzheimer's Association . Collaborated with  appropriate clinical care team members regarding patient needs Patient Self Care Activities & Deficits:  . Patient is unable to independently navigate community resource options without care coordination support  . Acknowledges deficits and is motivated to resolve concern  Initial goal documentation    . Personal Care Services       CARE PLAN ENTRY (see longitudinal plan of care for additional care plan information)  Current Barriers:   . Patient with HTN and Dementia needs Support, Education, and Care Coordination to resolve unmet Personal Care needs . Patient needs PCP to complete PCS Referral Clinical Social Work Goal(s):  [name] Over the next 30 to 45 days, patient will have personal care needs met as evident by having PCS Aide in the home assisting with needs. . Over the next 30 days patient will work with LCSW, and [facility] to coordinate care for [facility] and select a personal care service provider  Interventions provided by LCSW : . Assessed needs, level of care concerns, basic eligibility and provided education on Personal Care Service process and list of providers,  . Collaborate with primary care provider ref completing PCS referral ( Referral placed in PCP's mailbox 07/08/2019) . PCS referral will be faxed to [employer] at [phone] once completed and signed by PCP . LCSW will collaborate with [facility] to verify application is received and processed.  Patient Self Care Activities & Deficits:  . Patient is unable to perform ADLs independently without assistance  . Family/support system will assist patient with meeting needs until [facility] is approved . Return calls from [facility] to set up initial PCS assessment once application is approved .  Call [facility] with questions [phone] or [phone]  Initial goal documentation       Outpatient Encounter Medications as of 07/08/2019  Medication Sig Note  . donepezil (ARICEPT) 5  MG tablet Take 1 tablet (5 mg total) by mouth at bedtime.   . traZODone (DESYREL) 50 MG tablet Take 1 tablet (50 mg total) by mouth at bedtime. 11/19/2018: LF 8/17, 90 days supply    No facility-administered encounter medications on file as of 07/08/2019.      Information about Care Management services was shared with Ms.  Inzunza today including:  1. Care Management services include personalized support from designated clinical staff supervised by her physician, including individualized plan of care and coordination with other care providers 2. Remind patient of 24/7 contact phone numbers to provider's office for assistance with urgent and routine care needs. 3. Care Management services are voluntary and patient may stop at any time .  Patient agreed to services provided today and verbal consent obtained.   Follow Up Plan: LCSW will fax PCS forms once completed by PCP. Will also F/U with patient and daughter in 1 week  Casimer Lanius, [place] / [place]   [phone] 1:08 PM

## 2019-07-08 NOTE — Patient Instructions (Signed)
Licensed Clinical Social Worker Visit Information Ms. Cockerill  it was nice speaking with you and your daughter today. Please call me directly if you have questions [phone] Goals we discussed today:  Goals Addressed            This Visit's Progress   . Care Giver support and resources       CARE PLAN ENTRY (see longitudinal plan of care for additional care plan information) Current Barriers:  . Patient with Dementia needs community resources for caregiver  . Acknowledges deficits and needs support, education and care coordination in order to meet this unmet need  Clinical Goal(s) Over the next 30 days, Provide patient's daughter with resources and support to assist with meeting need Interventions provided by LCSW:  . Assessed patient's care coordination needs and discussed ongoing care management follow up  . Provided patient's daughter with educational information : Caring for Person with Alzheimer's Disease, EMMI education on Demential Caregiver as well as NC360 Referral to Alzheimer's Association . Collaborated with appropriate clinical care team members regarding patient needs Patient Self Care Activities & Deficits:  . Patient is unable to independently navigate community resource options without care coordination support  . Acknowledges deficits and is motivated to resolve concern  Initial goal documentation    . Personal Care Services       CARE PLAN ENTRY (see longitudinal plan of care for additional care plan information)  Current Barriers:   . Patient with HTN and Dementia needs Support, Education, and Care Coordination to resolve unmet Personal Care needs . Patient needs PCP to complete PCS Referral Clinical Social Work Goal(s):  [name] Over the next 30 to 45 days, patient will have personal care needs met as evident by having PCS Aide in the home assisting with needs. . Over the next 30 days patient will work with LCSW, and [facility] to coordinate care for [facility] and  select a personal care service provider  Interventions provided by LCSW : . Assessed needs, level of care concerns, basic eligibility and provided education on Personal Care Service process and list of providers,  . Collaborate with primary care provider ref completing PCS referral ( Referral placed in PCP's mailbox 07/08/2019) . PCS referral will be faxed to [employer] at [phone] once completed and signed by PCP . LCSW will collaborate with [facility] to verify application is received and processed.  Patient Self Care Activities & Deficits:  . Patient is unable to perform ADLs independently without assistance  . Family/support system will assist patient with meeting needs until [facility] is approved . Return calls from [facility] to set up initial PCS assessment once application is approved . Call [facility] with questions [phone] or [phone]  Initial goal documentation     Materials provided: Yes: dementia care information  Ms. Micheletti received Care Management services today:  1. Care Management services include personalized support from designated clinical staff supervised by her physician, including individualized plan of care and coordination with other care providers 2. 24/7 contact [phone] for assistance for urgent and routine care needs. 3. Care Management are voluntary services and be declined at any time by calling the office.  Patient and daughter verbally agreed to assistance and services provided by embedded care coordination/care management team today.   Patient verbalizes understanding of instructions provided today.  Follow up plan:  SW will follow up with patient by phone over the next week  Maurine Cane, LCSW

## 2019-07-09 ENCOUNTER — Encounter: Payer: Self-pay | Admitting: Neurology

## 2019-07-09 LAB — HIV ANTIBODY (ROUTINE TESTING W REFLEX): HIV Screen 4th Generation wRfx: NONREACTIVE

## 2019-07-09 LAB — RPR: RPR Ser Ql: NONREACTIVE

## 2019-07-11 ENCOUNTER — Ambulatory Visit: Payer: Self-pay | Admitting: Licensed Clinical Social Worker

## 2019-07-11 NOTE — Chronic Care Management (AMB) (Signed)
   Social Work  Care Management Consultation  07/11/2019 Name: Sherry Hicks MRN: [id number] DOB: 10-05-1960 Sherry Hicks is a 59 y.o. year old female who sees [name], [name], DO for primary care. LCSW was consulted by Dr. Darin Engels via in-basket for possible referral for additional coordination needs.   Recommendation:   No need to place a referral to CCM RN. The referral has been placed to neurology and based on the chart, appointment is scheduled Aug. 31st psychiatry.      No Referral has been placed for psychiatry. If this is need PCP will need to discuss with patient and place referral to [facility].  Dr. Manson Passey placed the referral for [facility]. Looks like [employer] sent it over on May 24th    Order has been placed for Home Health by Dr. Manson Passey.   I also placed a personal care services referral in Dr. Cherlynn Polo Lott's mail box on May 25th   Intervention: Patient was not interviewed or contacted during this encounter.   Review of patient status, including review of consultants reports, relevant laboratory and other test results, and collaboration with appropriate care team members and the patient's provider was performed as part of comprehensive patient evaluation and provision of chronic care management services.    Plan:  F/U phone appointment with LCSW next week  Sammuel Hines, LCSW Chronic Care Coordination  [facility] / [facility]   [phone] 11:30 AM

## 2019-07-17 ENCOUNTER — Other Ambulatory Visit: Payer: Self-pay

## 2019-07-17 ENCOUNTER — Ambulatory Visit: Payer: Medicare Other | Admitting: Licensed Clinical Social Worker

## 2019-07-17 DIAGNOSIS — Z741 Need for assistance with personal care: Secondary | ICD-10-CM

## 2019-07-17 DIAGNOSIS — Z636 Dependent relative needing care at home: Secondary | ICD-10-CM

## 2019-07-17 NOTE — Chronic Care Management (AMB) (Signed)
Care Management   Clinical Social Work Follow Up   07/17/2019 Name: Sherry Hicks MRN: [id number] DOB: 09/18/1960 Referred by: [name], DO  Reason for referral : Care Coordination (F/U call for caregiver resources and support) Sherry Hicks is a 59 y.o. year old female who is a primary care patient of [name], [name], DO.   Reason for follow-up: Phone encounter with patient's daughter today for ongoing assessment and brief interventions for caregiver resources and support Assessment: Patient's daughter continues to experience stress with trying to meet patient's needs without additional support in the home. Inquired about the delay for PCS services.  Review of patient status, including review of consultants reports, relevant laboratory and other test results, and collaboration with appropriate care team members and the patient's provider was performed as part of comprehensive patient evaluation and provision of care management services.    Advance Directive Status: copy of document provided to [facility] front office during last encounter.    Goals Addressed            This Visit's Progress   . Care Giver support and resources   On track    [place] (see longitudinal plan of care for additional care plan information) Current Barriers & Progress:  . Patient with Dementia needs community resources for caregiver  . Acknowledges deficits and needs support, education and care coordination in order to meet this unmet need  . Patient's daughter continues to need additional support with patient Clinical Goal(s) Over the next 30 days, Provide patient's daughter with resources and support to assist with meeting need Interventions provided by LCSW:  . Assessed patient's care coordination needs and discussed ongoing care management follow up  . Provided patient's daughter with educational information : Caring for Person with Alzheimer's Disease, EMMI education on Demential  Caregiver as well as NC360 Referral to Alzheimer's Association . F/U with Alzheimer's Association reference update on referral as well as collaborated with [facility] coordinator for barriers with referral . Referral place for [employer] (CAPS) spoke with Nira Conn [phone].( the State will mail application to patient and daugher) Patient Self Care Activities & Deficits:  . Patient is unable to independently navigate community resource options without care coordination support  . Acknowledges deficits and is motivated to resolve concern  Please see past updates related to this goal by clicking on the "Past Updates" button in the selected goal     . [place]   Not on track    [place] (see longitudinal plan of care for additional care plan information)  Current Barriers & Progress:   . Patient with HTN and Dementia needs Support, Education, and Care Coordination to resolve unmet Personal Care needs . Daughter continues to express need for personal care assistance with patient  . Progress: Daughter has review PCS list and select 3 providers . Barrier: Patient needs PCP to complete PCS Referral Clinical Social Work Goal(s):  [name] Over the next 30 to 45 days, patient will have personal care needs met as evident by having PCS Aide in the home assisting with needs. . Over the next 30 days patient will work with LCSW, and [facility] to coordinate care for [facility] and select a personal care service provider  Interventions provided by LCSW : . Assessed needs, reviewed Personal Care Service process, what has been completed and needs to be completed  . F/U with primary care provider ref completing PCS referral ( Referral placed in PCP's mailbox 07/08/2019) .  PCS referral will be faxed to [employer] at [phone] once completed and signed by PCP . LCSW will collaborate with [facility] to verify application is received and processed.   Patient Self Care Activities & Deficits:  . Patient is unable to perform ADLs independently without assistance  . Family/support system will assist patient with meeting needs until [facility] is approved . Return calls from [facility] to set up initial PCS assessment once application is approved . Call [facility] with questions [phone] or [phone]  Please see past updates related to this goal by clicking on the "Past Updates" button in the selected goal       Outpatient Encounter Medications as of 07/17/2019  Medication Sig Note  . donepezil (ARICEPT) 5 MG tablet Take 1 tablet (5 mg total) by mouth at bedtime.   . traZODone (DESYREL) 50 MG tablet Take 1 tablet (50 mg total) by mouth at bedtime. 11/19/2018: LF 8/17, 90 days supply    No facility-administered encounter medications on file as of 07/17/2019.   Plan: LCSW will provide patient and daughter an update in 1 to 2 weeks  Casimer Lanius, [place] / [place]   [phone] 2:01 PM

## 2019-07-23 ENCOUNTER — Ambulatory Visit: Payer: Self-pay | Admitting: Licensed Clinical Social Worker

## 2019-07-23 NOTE — Chronic Care Management (AMB) (Signed)
   Social Work  Care Management Collaboration 07/23/2019 Name: AYRIANA WIX MRN: [id number] DOB: 18-Sep-1960 JUDAEA BURGOON is a 59 y.o. year old female who sees [place], [name], DO for primary care. LCSW was consulted  to assistance patient with  Level of Care Concerns for [facility] referral   Intervention: Patient was not interviewed or contacted during this encounter.  LCSW collaborated with [facility] and [facility] staff for patient's needs. Plan: LCSW will F/U with [place] and patient's daughter  Review of patient status, including review of consultants reports, relevant laboratory and other test results, and collaboration with appropriate care team members and the patient's provider was performed as part of comprehensive patient evaluation and provision of chronic care management services.    Goals Addressed            This Visit's Progress   . Personal Care Services   On track    CARE PLAN ENTRY (see longitudinal plan of care for additional care plan information)  Current Barriers & Progress:   . Patient with HTN and Dementia needs Support, Education, and Care Coordination to resolve unmet Personal Care needs . Daughter continues to express need for personal care assistance with patient  . PCS referral completed and faxed to [place] Work Goal(s):  [name] Over the next 30 to 45 days, patient will have personal care needs met as evident by having PCS Aide in the home assisting with needs. . Over the next 30 days patient will work with LCSW, and [facility] to coordinate care for [facility] and select a personal care service provider  Interventions provided by LCSW : . Assessed needs, reviewed Personal Care Service process, what has been completed and needs to be completed  . LCSW collaborated with [facility] to verify application is received and processed. Referral returned with incorrect birthday.  Correction made and re-faxed 07/23/19 Patient Self Care  Activities & Deficits:  . Patient is unable to perform ADLs independently without assistance  . Family/support system will assist patient with meeting needs until [facility] is approved . Return calls from [facility] to set up initial PCS assessment once application is approved . Call [facility] with questions [phone] or [phone]  Please see past updates related to this goal by clicking on the "Past Updates" button in the selected goal      Casimer Lanius, [place] / [place]   [phone] 9:25 AM

## 2019-07-29 ENCOUNTER — Other Ambulatory Visit: Payer: Self-pay

## 2019-07-29 ENCOUNTER — Ambulatory Visit: Payer: Self-pay | Admitting: Licensed Clinical Social Worker

## 2019-07-29 ENCOUNTER — Telehealth: Payer: Self-pay | Admitting: *Deleted

## 2019-07-29 ENCOUNTER — Encounter: Payer: Self-pay | Admitting: Family Medicine

## 2019-07-29 ENCOUNTER — Ambulatory Visit (INDEPENDENT_AMBULATORY_CARE_PROVIDER_SITE_OTHER): Payer: Medicare Other | Admitting: Family Medicine

## 2019-07-29 VITALS — BP 124/80 | Wt 118.1 lb

## 2019-07-29 DIAGNOSIS — R634 Abnormal weight loss: Secondary | ICD-10-CM

## 2019-07-29 DIAGNOSIS — Z0000 Encounter for general adult medical examination without abnormal findings: Secondary | ICD-10-CM

## 2019-07-29 DIAGNOSIS — E785 Hyperlipidemia, unspecified: Secondary | ICD-10-CM

## 2019-07-29 DIAGNOSIS — R4182 Altered mental status, unspecified: Secondary | ICD-10-CM

## 2019-07-29 DIAGNOSIS — F0280 Dementia in other diseases classified elsewhere without behavioral disturbance: Secondary | ICD-10-CM

## 2019-07-29 DIAGNOSIS — G300 Alzheimer's disease with early onset: Secondary | ICD-10-CM

## 2019-07-29 MED ORDER — ATORVASTATIN CALCIUM 80 MG PO TABS: 80.0000 mg | ORAL_TABLET | Freq: Every day | ORAL | 2 refills | Status: DC

## 2019-07-29 MED ORDER — EZETIMIBE 10 MG PO TABS: 10.0000 mg | ORAL_TABLET | Freq: Every day | ORAL | 3 refills | Status: DC

## 2019-07-29 MED ORDER — DONEPEZIL HCL 5 MG PO TABS: 5.0000 mg | ORAL_TABLET | Freq: Every day | ORAL | 3 refills | Status: DC

## 2019-07-29 NOTE — Progress Notes (Signed)
SUBJECTIVE:   CHIEF COMPLAINT / HPI:   Ms. Dewing is a 59 yo female that presents with her daughter who is her caregiver/POA. Her daughter has provided the HPI today. The patient says she is doing well. The daughter's concerns are listed below.   Weight loss Continues to have weight loss despite eating her favorite foods such as fruit. She is also supplementing with boost and ensure. Would like to discuss possible stimulant boosting medication. She denies emesis or diarrhea. Is overdue for colonoscopy and mammogram. Will make follow up appointment for pap.   Altered mental status Continues to need 24 hour assistance with ADLs. No change from baseline last visit. Daughter found her standing over her while sleeping last night.   Hyperlipidemia Aware of increased cholesterol labs. Agreeable to start medication today and inquires about side effects.   PERTINENT  PMH / PSH:   OBJECTIVE:   BP 124/80   Wt 118 lb 2 oz (53.6 kg)   BMI 19.07 kg/m     General: Appears well but confused on surroundings and situation, no acute distress. Age appropriate. Thin appearance. Daughter and infant grandson present. Cardiac: RRR, normal heart sounds, no murmurs Respiratory: CTAB, normal effort Neuro: alert and oriented to self, person, place. Unsure of time or date.  Psych: normal affect, calm manner.  ASSESSMENT/PLAN:   Weight loss, unintentional Ongoing weight loss. Last visits 125lbs (07/01/19), 120lbs (07/08/19), 118lbs this visit. Previously weighed 262 in 2018 and 172 in December of 2020. Likely multifactorial due to decline in mental status and the ability to complete ADLs without assistance. Would not add on a medication at this time until we gotten a clear picture as far as mental diagnosis. For now have encourage daughter to hold donepezil to see if appetite increases. Will obtain cancer screening such as colonoscopy, mammography, and discussed making future appt for pap. Chart indicated  current everyday smoker. Will need to clarify with daughter to see if this is still the case and give assistance with cessation. TSH this visit wnl.  -Follow up for pap 07/31/19  Altered mental status, unspecified Please see Dr. Sherran Needs note from 07/08/2019 for complete background. Additionally last MOCA was in 2020 with a score of 3/30. This patient is stable and management moving forward will need the combination of various resources. From an etiology standpoint is likely multifactorial but possible vascular dementia due to high cholesterol levels. LDL of 269 07/01/19. Will start statin today as below. Further plans are to have her seen and further evaluated in Geriatric clinic 08/28/19. Patient will also have Neuro follow up with [facility] where she was seen prior but was lost to 2 month follow up. Appt. Scheduled for 10/14/19. Patient will also have CCM follow up call 08/13/19. I will continue to follow along with this management plan and assist where possible.  -Would continue holding trazodone, daughter states patient is out and has not required it recently but episode last night she may need something for sleep. Can attempt 1-3 mg melatonin if needed for sleep (daughter has this at home)  Hyperlipidemia LDL-c 269. ASCVD risk 12%. Will start Atorvastatin 80mg  and zetia 10mg . Daughter counseled on possible muscle cramps. Can stop if this occurs tryin 1-2 days without or until cramps resolve and restart. Could take a week or two for cramps to stop and if not bothersome to the patient is ok to continue. With any questions or concerns with starting this medication daughter encouraged to call office to further discuss.  [name], [place]

## 2019-07-29 NOTE — Patient Instructions (Addendum)
It was very nice to see you today. Please enjoy the rest of your week. Today you were seen for weight loss, memory issues, and high cholesterol. I have prescribed zetia and lipitor for your cholesterol please take these daily. Follow up 08/28/2019 for geri clinic appt or sooner if needed. You can try stopping the donepezil in the meantime to see if this boosts appetite.  You have been referred back to [facility] neurology they will call you to make an appt.  You have also been referred for your mammogram and colonoscopy. You will be called to make an appt for these. Please let us know if you do not hear from these referral in 1-2 weeks.   Please call the clinic at [phone] if your symptoms worsen or you have any concerns. It was our pleasure to serve you.

## 2019-07-29 NOTE — Telephone Encounter (Signed)
FYI:  I have scheduled this patient for geri clinic on 08-28-19.  I called and spoke with her daughter [name] and let her know of the date and time.  Patient does have an appointment today with her pcp and will get a print out of the appointment with her AVS.  Dael Howland,CMA

## 2019-07-29 NOTE — Chronic Care Management (AMB) (Signed)
   Social Work  Care Management Collaboration 07/29/2019 Name: Sherry Hicks MRN: [id number] DOB: October 10, 1960 Sherry Hicks is a 59 y.o. year old female who sees [place], [name], DO for primary care. LCSW was consulted to assistance patient with  Level of Care Concerns for [place].  PCS assessment completed June 14th   Intervention: Patient was not interviewed or contacted during this encounter.  LCSW collaborated with [facility] ref. Update on PCS referral. LCSW also checked to see if POA paper work scanned into chart.  Collaborated with Kennyth Lose in [facility] office. Plan: LCSW will F/U in two weeks  Review of patient status, including review of consultants reports, relevant laboratory and other test results, and collaboration with appropriate care team members and the patient's provider was performed as part of comprehensive patient evaluation and provision of chronic care management services.    Goals Addressed            This Visit's Progress   . Personal Care Services       CARE PLAN ENTRY (see longitudinal plan of care for additional care plan information)  Current Barriers & Progress:   . Patient with HTN and Dementia needs Support, Education, and Care Coordination to resolve unmet Personal Care needs . Daughter continues to express need for personal care assistance with patient  . PCS assessment completed June 14th  Clinical Social Work Goal(s):  [name] Over the next 30 to 45 days, patient will have personal care needs met as evident by having PCS Aide in the home assisting with needs. . Over the next 30 days patient will work with LCSW, and [facility] to coordinate care for [facility] and select a personal care service provider  Interventions provided by LCSW : . LCSW collaborated with [facility] to verify application is received and processed.  Patient Self Care Activities & Deficits:  . Patient is unable to perform ADLs independently without  assistance  . Family/support system will assist patient with meeting needs until [facility] is approved . Return calls from [facility] to set up initial PCS assessment once application is approved . Call [facility] with questions [phone] or [phone]  Please see past updates related to this goal by clicking on the "Past Updates" button in the selected goal       Casimer Lanius, [place] / [place]   [phone] 11:43 AM

## 2019-07-29 NOTE — Telephone Encounter (Signed)
-----   Message from [facility], DO sent at 07/02/2019  1:37 PM EDT ----- Regarding: [facility] Referral I have referred this patient to geri clinic for early onset dementia. Her daughter is POA.

## 2019-07-30 ENCOUNTER — Encounter: Payer: Self-pay | Admitting: Family Medicine

## 2019-07-30 DIAGNOSIS — E785 Hyperlipidemia, unspecified: Secondary | ICD-10-CM | POA: Insufficient documentation

## 2019-07-30 LAB — TSH: TSH: 0.883 u[IU]/mL (ref 0.450–4.500)

## 2019-07-30 NOTE — Assessment & Plan Note (Addendum)
LDL-c 269. ASCVD risk 12%. Will start Atorvastatin 80mg  and zetia 10mg . Daughter counseled on possible muscle cramps. Can stop if this occurs tryin 1-2 days without or until cramps resolve and restart. Could take a week or two for cramps to stop and if not bothersome to the patient is ok to continue. With any questions or concerns with starting this medication daughter encouraged to call office to further discuss.

## 2019-07-30 NOTE — Assessment & Plan Note (Addendum)
Ongoing weight loss. Last visits 125lbs (07/01/19), 120lbs (07/08/19), 118lbs this visit. Previously weighed 262 in 2018 and 172 in December of 2020. Likely multifactorial due to decline in mental status and the ability to complete ADLs without assistance. Would not add on a medication at this time until we gotten a clear picture as far as mental diagnosis. For now have encourage daughter to hold donepezil to see if appetite increases. Will obtain cancer screening such as colonoscopy, mammography, and discussed making future appt for pap. Chart indicated current everyday smoker. Will need to clarify with daughter to see if this is still the case and give assistance with cessation. TSH this visit wnl.  -Follow up for pap 07/31/19

## 2019-07-30 NOTE — Assessment & Plan Note (Addendum)
Please see Dr. Sherran Needs note from 07/08/2019 for complete background. Additionally last MOCA was in 2020 with a score of 3/30. This patient is stable and management moving forward will need the combination of various resources. From an etiology standpoint is likely multifactorial but possible vascular dementia due to high cholesterol levels. LDL of 269 07/01/19. Will start statin today as below. Further plans are to have her seen and further evaluated in Geriatric clinic 08/28/19. Patient will also have [facility] follow up with [facility] where she was seen prior but was lost to 2 month follow up. Appt. Scheduled for 10/14/19. Patient will also have CCM follow up call 08/13/19. I will continue to follow along with this management plan and assist where possible.  -Would continue holding trazodone, daughter states patient is out and has not required it recently but episode last night she may need something for sleep. Can attempt 1-3 mg melatonin if needed for sleep (daughter has this at home)

## 2019-07-31 ENCOUNTER — Telehealth: Payer: Medicare Other

## 2019-07-31 ENCOUNTER — Ambulatory Visit: Payer: Medicare Other | Admitting: Student in an Organized Health Care Education/Training Program

## 2019-08-06 ENCOUNTER — Telehealth: Payer: Self-pay | Admitting: Family Medicine

## 2019-08-06 NOTE — Telephone Encounter (Signed)
Patients POA is dropping off paperwork to be filled out and fax over today for CAP program. Fax number is [phone]. When papers are done POA would like to be called to let them know that are faxed over [phone]. Placing papers in the red team folder. Thanks

## 2019-08-06 NOTE — Telephone Encounter (Signed)
Reviewed form and placed in PCP's box for completion.  .Janeth Terry R Ahleah Simko, CMA  

## 2019-08-13 ENCOUNTER — Other Ambulatory Visit: Payer: Self-pay

## 2019-08-13 ENCOUNTER — Ambulatory Visit: Payer: Medicare Other | Admitting: Licensed Clinical Social Worker

## 2019-08-13 DIAGNOSIS — Z7189 Other specified counseling: Secondary | ICD-10-CM

## 2019-08-13 DIAGNOSIS — Z636 Dependent relative needing care at home: Secondary | ICD-10-CM

## 2019-08-13 DIAGNOSIS — Z741 Need for assistance with personal care: Secondary | ICD-10-CM

## 2019-08-13 NOTE — Telephone Encounter (Signed)
Form faxed

## 2019-08-13 NOTE — Chronic Care Management (AMB) (Signed)
Care Management   Clinical Social Work Follow Up   08/13/2019 Name: Sherry Hicks MRN: [id number] DOB: 1960/07/16 Referred by: [name], DO  Reason for referral : Care Coordination (F/U call)  Sherry Hicks is a 59 y.o. year old female who is a primary care patient of [place], [name], DO.  Reason for follow-up: assess for barriers and progress with personal care services and caregiver support .   Assessment: Patient's daughter continues to experience difficulty with getting personal care services started. Patient approved for 2 hours per day but needs additional support. Discussed other options with daughter.  Plan:  1. LCSW will F/U with patient and daughter during [place] 2.  Referral placed for the PACE program Advance Directive Status: N See  [employer] application for related entries. Document completed waiting to be scanned in the chart.  SDOH (Social Determinants of Health) assessments performed: ; No needs identified   Goals Addressed            This Visit's Progress   . Care Giver support and resources   On track    [place] (see longitudinal plan of care for additional care plan information) Current Barriers & Progress:  . Patient with Dementia needs community resources for caregiver  . Acknowledges deficits and needs support, education and care coordination in order to meet this unmet need  . Patient's daughter received and completed CAPS application . Second part left for PCP to complete ( CMA placed in mailbox of PCP) Clinical Goal(s) Over the next 30 days, Provide patient's daughter with resources and support to assist with meeting need Interventions provided by LCSW:  . Assessed patient's care coordination needs and discussed ongoing care management follow up  . LCSW will F/U with PCP on CAP paperwork . Provided patient's daughter with educational information about the PACE program.  Received verbal permission to make referral . PACE referral  submitted to [place] to provide patient and family with more information about PACE program Patient Self Care Activities & Deficits:  . Patient is unable to independently navigate community resource options without care coordination support  . Acknowledges deficits and is motivated to resolve concern  Please see past updates related to this goal by clicking on the "Past Updates" button in the selected goal     . [place]   On track    [place] (see longitudinal plan of care for additional care plan information)  Current Barriers & Progress:   . Patient with HTN and Dementia needs Support, Education, and Care Coordination to resolve unmet Personal Care needs . Daughter continues to express need for personal care assistance with patient  . PCS assessment completed June 14th  family continues to wait for PCS services to start . PCS agency met with patient and family completed assessment  Clinical Social Work Goal(s):  [name] Over the next 30 to 45 days, patient will have personal care needs met as evident by having PCS Aide in the home assisting with needs. . Over the next 30 days patient will work with LCSW, and [facility] to coordinate care for [facility] and select a personal care service provider  Interventions provided by LCSW : . Assessed needs and barriers with connecting to [facility].  Patient Self Care Activities & Deficits:  . Patient is unable to perform ADLs independently without assistance  . Family/support system will assist patient with meeting needs until [facility] is approved . Call [facility] with questions [phone]  or [phone]  . Follow up with PCS agency to find out start date for services Please see past updates related to this goal by clicking on the "Past Updates" button in the selected goal        Outpatient Encounter Medications as of 08/13/2019  Medication Sig Note  . atorvastatin (LIPITOR) 80 MG tablet Take 1 tablet (80 mg total) by mouth  daily.   [name] donepezil (ARICEPT) 5 MG tablet Take 1 tablet (5 mg total) by mouth at bedtime.   [name] ezetimibe (ZETIA) 10 MG tablet Take 1 tablet (10 mg total) by mouth daily.   . traZODone (DESYREL) 50 MG tablet Take 1 tablet (50 mg total) by mouth at bedtime. 11/19/2018: LF 8/17, 90 days supply    No facility-administered encounter medications on file as of 08/13/2019.    Review of patient status, including review of consultants reports, relevant laboratory and other test results, and collaboration with appropriate care team members and the patient's provider was performed as part of comprehensive patient evaluation and provision of care management services.    Casimer Lanius, [place] / [place]   [phone] 9:24 AM

## 2019-08-13 NOTE — Telephone Encounter (Signed)
Completed, stamped and placed in RN triage box. Please attach medication list. Thank you.

## 2019-08-21 ENCOUNTER — Ambulatory Visit: Payer: Medicare Other

## 2019-08-28 ENCOUNTER — Ambulatory Visit: Payer: Medicare Other | Admitting: Licensed Clinical Social Worker

## 2019-08-28 ENCOUNTER — Other Ambulatory Visit: Payer: Self-pay

## 2019-08-28 ENCOUNTER — Ambulatory Visit (INDEPENDENT_AMBULATORY_CARE_PROVIDER_SITE_OTHER): Payer: Medicare Other | Admitting: Family Medicine

## 2019-08-28 ENCOUNTER — Ambulatory Visit: Payer: Medicare Other | Admitting: Physical Therapy

## 2019-08-28 VITALS — BP 114/78 | HR 73 | Ht 66.0 in | Wt 118.0 lb

## 2019-08-28 DIAGNOSIS — R482 Apraxia: Secondary | ICD-10-CM

## 2019-08-28 DIAGNOSIS — Z8709 Personal history of other diseases of the respiratory system: Secondary | ICD-10-CM

## 2019-08-28 DIAGNOSIS — F29 Unspecified psychosis not due to a substance or known physiological condition: Secondary | ICD-10-CM

## 2019-08-28 DIAGNOSIS — Z636 Dependent relative needing care at home: Secondary | ICD-10-CM

## 2019-08-28 DIAGNOSIS — F0390 Unspecified dementia without behavioral disturbance: Secondary | ICD-10-CM

## 2019-08-28 DIAGNOSIS — Z789 Other specified health status: Secondary | ICD-10-CM

## 2019-08-28 DIAGNOSIS — F0391 Unspecified dementia with behavioral disturbance: Secondary | ICD-10-CM

## 2019-08-28 DIAGNOSIS — R634 Abnormal weight loss: Secondary | ICD-10-CM

## 2019-08-28 DIAGNOSIS — Z7189 Other specified counseling: Secondary | ICD-10-CM

## 2019-08-28 DIAGNOSIS — Z8659 Personal history of other mental and behavioral disorders: Secondary | ICD-10-CM

## 2019-08-28 MED ORDER — RISPERIDONE 1 MG PO TABS: 1.0000 mg | ORAL_TABLET | Freq: Every day | ORAL | 1 refills | Status: DC

## 2019-08-28 NOTE — Chronic Care Management (AMB) (Signed)
Care Management   Clinical Social Work Follow Up   08/28/2019 Name: Sherry Hicks MRN: [id number] DOB: 1960/10/19 Referred by: [name], DO  Reason for referral : Care Coordination (F/U for caregiver support)  Sherry Hicks is a 59 y.o. year old female who is a primary care patient of [name], [place], DO.  Reason for follow-up: assess for barriers and progress with managing care .   Assessment: Patient and daughter continues to experience difficulty and multi barriers with getting support in home to help with ADL's.   Patient would like continued follow-up from CCM LCSW.   Plan: LCSW will F/U in 1 week  Advance Directive Status: N See  [employer] application for related entries. ; SDOH (Social Determinants of Health) assessments performed: Yes ;  SDOH Interventions     Most Recent Value  SDOH Interventions  SDOH Interventions for the Following Domains Depression  Depression Interventions/Treatment  Referral to Psychiatry      Goals Addressed            This Visit's Progress   . Care Giver support and resources       CARE PLAN ENTRY (see longitudinal plan of care for additional care plan information) Current Barriers & Progress:  . Patient with Dementia needs community resources for caregiver  . Acknowledges deficits and needs support, education and care coordination in order to meet this unmet need  . Patient's daughter received and completed CAPS application . Second part left for PCP to complete. Form completed and faxed by PCP . Patient and daughter met with [facility] intake but has not made a decision  Clinical Goal(s) Over the next 30 days, Provide patient's daughter with resources and support to assist with meeting need Interventions provided by LCSW:  . Assessed patient's care coordination needs and discussed ongoing care management follow up  . LCSW will F/U with PCP on CAP paperwork . Provided patient's daughter with a brochure about the [facility] program.  Encouraged her to call and schedule a site visit. [facility] waiting on her to make a decision. . Provided contact information for [facility].  Patient Self Care Activities & Deficits:  . Patient is unable to independently navigate community resource options without care coordination support  . Acknowledges deficits and is motivated to resolve concern  . Daughter will call and F/U with [facility] and CAP . Will also call to make appointment with [facility]. Dr. McDiarmid wants patient to see Dr. Casimiro Needle Please see past updates related to this goal by clicking on the "Past Updates" button in the selected goal     . [place]   Not on track    [place] (see longitudinal plan of care for additional care plan information)  Current Barriers & Progress:   . Patient with HTN and Dementia needs Support, Education, and Care Coordination to resolve unmet Personal Care needs . Daughter continues to express need for personal care assistance with patient  . PCS assessment completed June 14th  family continues to wait for PCS services to start . PCS agency met with patient and family completed assessment  . Personal care services were scheduled to start but no one showed up Monday Clinical Social Work Goal(s):  [name] Over the next 30 to 45 days, patient will have personal care needs met as evident by having PCS Aide in the home assisting with needs. . Over the next 30 days patient will work with LCSW, and  [place] to coordinate care for [facility] and select a personal care service provider  Interventions provided by LCSW : . Assessed needs and barriers with connecting to [facility].  Patient Self Care Activities & Deficits:  . Patient is unable to perform ADLs independently without assistance  . Family/support system will assist patient with meeting needs until [facility] is approved . Call [facility] with questions [phone] or [phone]    . Follow up with PCS agency to find out start date for services Please see past updates related to this goal by clicking on the "Past Updates" button in the selected goal        Outpatient Encounter Medications as of 08/28/2019  Medication Sig Note  . atorvastatin (LIPITOR) 80 MG tablet Take 1 tablet (80 mg total) by mouth daily.   [name] donepezil (ARICEPT) 5 MG tablet Take 1 tablet (5 mg total) by mouth at bedtime.   [name] ezetimibe (ZETIA) 10 MG tablet Take 1 tablet (10 mg total) by mouth daily.   . risperiDONE (RISPERDAL) 1 MG tablet Take 1 tablet (1 mg total) by mouth at bedtime.   . traZODone (DESYREL) 50 MG tablet Take 1 tablet (50 mg total) by mouth at bedtime. 11/19/2018: LF 8/17, 90 days supply    No facility-administered encounter medications on file as of 08/28/2019.   Review of patient status, including review of consultants reports, relevant laboratory and other test results, and collaboration with appropriate care team members and the patient's provider was performed as part of comprehensive patient evaluation and provision of care management services.    Casimer Lanius, [place] / [place]   [phone] 4:20 PM

## 2019-08-29 ENCOUNTER — Encounter: Payer: Self-pay | Admitting: Family Medicine

## 2019-08-29 DIAGNOSIS — Z8659 Personal history of other mental and behavioral disorders: Secondary | ICD-10-CM | POA: Insufficient documentation

## 2019-08-29 DIAGNOSIS — R482 Apraxia: Secondary | ICD-10-CM

## 2019-08-29 DIAGNOSIS — Z789 Other specified health status: Secondary | ICD-10-CM | POA: Insufficient documentation

## 2019-08-29 DIAGNOSIS — Z636 Dependent relative needing care at home: Secondary | ICD-10-CM | POA: Insufficient documentation

## 2019-08-29 DIAGNOSIS — F03918 Unspecified dementia, unspecified severity, with other behavioral disturbance: Secondary | ICD-10-CM | POA: Insufficient documentation

## 2019-08-29 HISTORY — DX: Apraxia: R48.2

## 2019-08-29 HISTORY — DX: Other specified health status: Z78.9

## 2019-08-29 NOTE — Assessment & Plan Note (Addendum)
Established problem worsened.  Weight 09/09/2015 PCP ov Francene Finders. MD was 248 pounds. She weighed about 224 pounds in May 2020.  She now weighs 118 pounds. An unintentional loss of 108 pounds in little over a year. CMET/CBC/TSH unrevealing   Possible contributing causes:  Multifactorial - most likely Malignancy,  Psychosis,  Mood Disorder,  dementia with frailty syndrome,  oral/denture problems,  swallowing problems,  gastro-entero-colonic-biliary-pancreatic disorder(s),  isolation during meals,  difficulty feeding self;   Less likely - adrenal insufficiency, occult infection, and autoimmune    Possible Lab Tests :LDH,  ESR, vitamin B12,  ANA, TSH, FOBT, +/- urinalysis with microscopy and urine culture  Possible Imaging: Thoracic/Abdominal/Pelvic CT with contrast, EGD, Colonoscopy, Mammography  - Consult with Nutritionists - Exercise can increase appetite - +/- Nutritional supplements (provide two hours before or provide after meal) - Flavor enhancement (ham, bacon, roast beef flavors spinkled on cooked food or during food prep - Remove dietary restrictions,  if possible. Allow family to bring in patient's favorite foods and snacks.   - Change food consistency for chewing or swallowing difficulties - Assess Swallow function, if needed - Address dental or denture difficulties - Assistance with feeding, if needed - Meals in social setting, more leisurely environment simulating in-home dining - provide multivitamin

## 2019-08-29 NOTE — Assessment & Plan Note (Signed)
History of mood disorder  Per initial PCP ov 09/09/15 [facility]: "She was previously on Lexapro 20mg  daily (per prior PCP records), which she reports made her "feel calmer".

## 2019-08-29 NOTE — Addendum Note (Signed)
Addended byPerley Jain, Ardis Lawley D on: 08/29/2019 12:27 PM   Modules accepted: Orders

## 2019-08-29 NOTE — Patient Instructions (Addendum)
Dear Ms Keina Mutch -  Thank you very much for bringing your mother to see [place] in our geriatric assessment clinic.  After reviewing your mother's medical record, I agree with your neurologist that your mother does have dementia.  The most likely type of dementia is Alzheimer's Dementia.  Since her dementia started before age 59, doctors call it Early-onset Alzheimer's Dementia.   She is in a moderate stage of dementia where she needs assistance with her basic daily activities, like bathing, and dressing.   While I am not enthusiastic about the current medications for Alzheimer's dementia, if families want to try a trial of them to see if they think it helps, I will prescribe the medication for the trial.   I think your mother is experiencing the psychological and behavioral symptoms of dementia, including hearing voices, agitation and aggression, and disinhibition (like taking off her clothes at inappropriate times).    The new medication prescribed at your mother's visit with [place], risperidone, is intended to help decrease your mother's hearing voices.  It may also help decrease her agitation and insomnia.     Unfortunately, there are not medications that stop people from taking their clothes off, or wandering, and refusing care.  Those psychological and behavioral problems are best dealt without drugs.  Effective ways to deal with these bothersome behaviors is something we can learn.  The Alzheimer's Association is a [employer], [employer] that helps teach people to cope with these behaviors so the caregiver and the person with Alzheimers have positive relationships. You can find your local Alzheimer's Association chapter at [email or web address].hu.   I have sent our geriatric assessment and recommendations to Dr Salvadore Dom.  We will be available to her should she or you have more questions about your mother's health.

## 2019-08-29 NOTE — Assessment & Plan Note (Addendum)
Established problem Stage: Moderate Stage with impaired ADL abilities but preservation of Language and ambulation.  Type: Probable Alzheimers type dementia, early-onset  A less likely cause is behavior varient Frontotemporal Dementia given the age of onset, apparent rapid progression, and disinhibition.  Her presentation lacks other features of FTD, including apathy and compulsive behaviors needed to make such a diagnosis, Additionally, the insensitive but specific MRI finding of frontal or temporal lob atrophy is not present.  The patient's generalize age-advanced brain atrophy is more supportive of early-onset Alzheimers.   Recommendations In this case, the donepezil likely caused urinary incontinence because of its parasympathetic effect. Given its minimal affect on cognitve and functional decline in AD, I would recommend it discontinuation.    The patient's psychotic symptoms are likely related to her dementia, given that we do not have a clear evidence of a psychotic disorder, preceding the her dementia.  Her auditory hallucinations appear to be causing her some distress, so it would seem reasonable to try a trial of an atypical antipsychotic at low-dose with targeting of her apparent distress from her internal monolog.  Titrate to effectiveness.  If not effective in 4 weeks, then titrate off.  This therapy trial is a temporary attempt while her dgt arranges a psychiatric consultation.   I have not found where Ms Starner's B12 and folate has been checked.  That would be good to do at her next ov with Dr Salvadore Dom.   If there is believed to be a mood disorder component, then consider starting mirtazapine titrated to standard therapeutic dose.  This may also assist with her unintentional weight loss and insomnia concerns.   Ms Thum's daughter is sandwiched between the care for her young children and that for her needful mother.  Ms Bonnielee Haff is working with her to locate resources such as CAPS and  PCS custodial care to supplement the dgt's care.   An additional option, that I think might be the best one, is Ms Mccamy's enrollment with [facility].    If the dgt is interested, referral to the Alzheimer's Association can help with support and education of the caregiver.   Of note, [name] has discontinued all of her mother's medications she was prescribed before this visit.  Re-initiation of the patient's statin and antihypertensive is another task for another ov.

## 2019-08-29 NOTE — Progress Notes (Addendum)
CPT E&M Office Visit Time Before Visit; reviewing medical records (e.g. recent visits, labs, studies): 15 minutes During Visit (F2F time): 30 minutes After Visit (discussion with family or HCP, prescribing, ordering, referring, calling result/recommendations or documenting on same day): 15 minutes Total Visit Time: 85 minutes  [facility]:   Patient is accompanied by: patient's only child, her daughter, Yomira Flitton (Ms Annitta Fifield had her 53 month old son with her).  Primary caregiver: daughter Patient's lives with their daughter. Patient information was obtained from patient, relative(s), past medical records and Care Everywhere. History/Exam limitations: dementia and mental illness. Primary Care Provider: [name], DO Referring provider: [name], DO Reason for referral:  Chief Complaint  Patient presents with  . geri assessment   Previous Report Reviewed: historical medical records, lab reports, office notes, radiology reports and consult notes in Care Everywhere.   HPI from Dgt, Lethia Donlon  Patient has been living in [place], [place] since approximately 2014 when she left [place]. Her dgt remained in [place].  Her Dgt has had intermittent contact, mostly by phone, with her mother. [name] has little knowledge of her mother's activities while in [place], including any health care she received. She has concern that her mother came into contact with illicit substance(s) that caused this major change in her mother. [name] states she has no way or confirming this suspicion.   While [name] thought there had been some strange phone interactions with her mother over the last few years, it was not until March 2020 when she brought her mother back to [place] to recover after a fall with ankle injury requiring surgery that [name] noted a big change in her mother's behaviors and personality had happened.  [name] reports that the [place] SNF from  which she picked up her mother told her that her mother had dementia and would require 24/7 care. [name] had never before been told her mother had dementia. [name] links this fall with ankle injury as the start of her mothers cognitive and functional decline.   She established primary care at [facility] in May 2020. There her weight was 225 pounds. [facility] practice referred her to [facility] but the patient [facility], perhaps because she was by then back in [place]. An offered referral to psychiatry was declined.   In May 2020, at her mother's request, [name] returned her mother to live in [place].  In September 2020, [name] was contacted by a neighbor of her mother in [place].  She learned that her mother was wandering about the neighborhood, appearing confused.  [name] brought her mother to [place], and has cared for her ever since.   There is a Claims Encounter in Epic for an Observation Admission for "Brief Psychotic Disorder" on 10/31/18 - 11/01/18 by a psychiatrist in [place], [place], region. The patient must have then been transferred to [facility] wear she the Claim Encounter included diagnosis with altered mental status, and Bipolar Disorder (NOS).   Soon after coming to live in [place] for the second time in 2020, Josceline was brought in by [facility]  to [facility] ED on 11/18/2018.  She was more confused and had been behaving aggressively with a knife.  [facility] evaluated her in the ED.Geriatric psychiatry inpatient admission was recommended. She was transferred to [facility] in [place], [place], under involuntary committal. The accepting psychiatrist was Dr Donata Clay.  [name] was upset that the transfer was made without her permission.  Patient was later discharge to the care of her dgt. [name] stopped the several discharge psychiatric medications from [facility] facility bc her mother was  too sedated.   Early December, Ms Balazs was evaluated at [facility] Neu[facility] department, by [facility], NP, for memory loss. Her MoCA score was 3 out of 30.  The neurology note reported a patient history of drug use though I am uncertain from where this information originated .  The brain MRI at [facility] showed age advanced generalized atrophy that is new since 2007 head CT. There were no acute or reversible findings.  She was diagnoses with Alzheimer's dementia and untreated psychiatric disorder(s) by neurology specialist.   The patient was started on donepezil and a referral to psychiatry was ordered.   [name] stopped giving her mother the donepezil because she was incontinent when taking it.   Ms Shaquoya Cosper and her Leonie Green were patients of the [facility] many years ago.  [name] wanted to return to the [facility] practice for her mother's care.       Patient's Care Team Patient Care Team: [place], [facility], FNP as Consulting Physician (Nurse Practitioner)  ----------------------------------------------------------------------------------------------------------------------------------------------------------------------------------------------------------------------------------------------------------------   HPI by problems:  Chief Complaint  Patient presents with  . geri assessment      Compared to 5 to 10 years ago, how is the patient at:  Problems with Judgment, e.g., problem making decisions, bad financial decisions, problems with thinking?  yes; are worsening   Less interested in hobbies or previously enjoyed activities?   yes; are worsening   Problem remembering things about family and friends e.g. names,  occupations, birthdays, addresses?   yes; are worsening.  There are times when she does not recognize her dgt and grandson.   Problem remembering conversations or news events a few days later?  yes; are worsening  Problem remembering what day and month it  is? yes; are worsening  Problem with losing things?  yes; are worsening  Problem handling financial matters, e.g. their pension, checking, credit cards, dealing with the bank?  yes; are worsening  Problem with getting lost in familiar places?   yes; are worsening  Has there been a change in their usual personality?  Yes.   Frequently losing things  There are periods of the day when she is more coherent.     Behavioral and Psychological Symptoms of Dementia (relevant or irrelevant): relevant If relevant, address whether these symptoms are present:   2.   Irritability/Agitation/Aggression:                     Does the patient become impatient, cranky, have difficulty waiting? Yes. Profanity,                              yelling, crying, inappropriate laughter, labile mood from laughing to crying  .                     Does the patient resist care like bathing, dressing, eating/feeding? Yes. She can be                    unwilling and unable to dress and bath herself.  When she does her ADLs [name]                         has to tell her mother to eat, take a bath,  put clothes on and has to physically give                        her mother her medications. She has to give detailed instructions in regards to her                       mother bathing herself, putting clothes on, putting socks on, eating, taking baths                          and taking her medications. If not step-by-step instructed, her mother is unable to                     perform the task.                         Does the patient strike others? no                     3.   Depression/Dysphoria:                      Does the patient appear sad, tearful, withdrawn, disinterested? yes                     Has patient lost interest in eating?  yes.  Is the patient losing or gaining weight?  Yes  4.   Delusions:                      Does the patient believe that others are stealing from them or planning to hurt them in                      some way? No  5.   Hallucinations:                     Does the patient hearing voices or does he/she talk to people who are not there?                    Yes.  Victorino DikeJennifer talks to others who are not there all day.  She talks to her image in the                           mirror in a manner that is c/w her talking to another person.  She believes that TV is                    talking to her.                      Does the patient seeing things that others do not see? Uncertain  6.   Motor disturbances:                     Does the patient do the same thing over and over, like taking clothes out of drawers,                           pacing, or yelling repeatedly Yes. Patient was wandering around her neighborhood in  [place], apparently confused per her neighbor. She is currently not allowed to leave the                    house unaccompanied since moving in with her daughter.                    7.   Disinhibition:                     Does the patient seem to act impulsively, for example, talking to strangers as if he/she                        knows them? Yes. Removing clothing at inappropriate times.  Her dgt does not take her                    out in public because she will remove her clothing.                     Is the patient verbally abusive to others? no 8.   Nighttime issues:                     Is the patient frequently awakening at night or getting up too early? yes                    Does the patient wander about home at night? yes                    Has the patient wandered outside? No.  Dgt has kept the doors locked.     Outpatient Encounter Medications as of 08/28/2019  Medication Sig  . Ferrous Sulfate (IRON) 325 (65 Fe) MG TABS Take by mouth.  . [DISCONTINUED] atorvastatin (LIPITOR) 80 MG tablet Take 1 tablet (80 mg total) by mouth daily.  . [DISCONTINUED] donepezil (ARICEPT) 5 MG tablet Take 1 tablet (5 mg total) by mouth at bedtime.  .  [DISCONTINUED] ezetimibe (ZETIA) 10 MG tablet Take 1 tablet (10 mg total) by mouth daily.  . [DISCONTINUED] traZODone (DESYREL) 50 MG tablet Take 1 tablet (50 mg total) by mouth at bedtime.   No facility-administered encounter medications on file as of 08/28/2019.    History Patient Active Problem List   Diagnosis Date Noted  . Caregiver stress 08/29/2019    Priority: High  . Dementia with behavioral problem [facility])     Priority: High  . Altered mental status, unspecified 07/06/2019    Priority: High  . Weight loss, unintentional 07/06/2019    Priority: High  . Brief psychotic disorder (HCC) 10/31/2018    Priority: High  . Major depressive disorder, recurrent episode (HCC) 04/12/2006    Priority: High  . History of mood disorder 08/29/2019    Priority: Medium  . Alteration in performance of activities of daily living 08/29/2019    Priority: Medium  . TOBACCO DEPENDENCE 04/12/2006    Priority: Medium  . HYPERTENSION, BENIGN SYSTEMIC 04/12/2006    Priority: Medium  . Hyperlipidemia 07/30/2019    Priority: Low  . Hypernatremia 07/06/2019    Priority: Low  . History of asthma, mild, intermittent 2017    Priority: Low  . Apraxia 08/29/2019   Past Medical History:  Diagnosis Date  . Alteration in performance of activities of daily living 08/29/2019  . Apraxia 08/29/2019  . Arthritis   . Dementia (HCC)   .  Dementia with behavioral problem (HCC)   . History of asthma, mild, intermittent 02/14/2015  . Hypertension   . Smoker    Past Surgical History:  Procedure Laterality Date  . ANKLE SURGERY    . SALPINGOOPHORECTOMY    . TOTAL ABDOMINAL HYSTERECTOMY W/ BILATERAL SALPINGOOPHORECTOMY     for menorrhagia and pregnancy loss) and no longer requires GYN screening   Family History  Problem Relation Age of Onset  . Hypertension Mother   . Dementia Mother   . Asthma Father   . Cancer Father   . Canavan disease Sister   . Cancer Brother   . Diabetes Maternal Grandmother   .  Hypertension Maternal Grandmother   . Asthma Paternal Grandfather   . Cancer Paternal Grandfather   . Cancer Other   . Diabetes Other   . Dementia Paternal Grandmother    Social History   Socioeconomic History  . Marital status: Single    Spouse name: Not on file  . Number of children: 1  . Years of education: 54  . Highest education level: Not on file  Occupational History  . Occupation: Disability     Comment: Optician, dispensing Disability payments  Tobacco Use  . Smoking status: Current Every Day Smoker  . Smokeless tobacco: Never Used  Substance and Sexual Activity  . Alcohol use: No  . Drug use: No  . Sexual activity: Not Currently    Partners: Male  Other Topics Concern  . Not on file  Social History Narrative        Moved from [place] Oct. 2020. Lives with daughter who is POA ( copy of paper work provided)   Social Determinants of Corporate investment banker Strain:   . Difficulty of Paying Living Expenses:   Food Insecurity:   . Worried About Programme researcher, broadcasting/film/video in the Last Year:   . Barista in the Last Year:   Transportation Needs:   . Freight forwarder (Medical):   [name] Lack of Transportation (Non-Medical):   Physical Activity:   . Days of Exercise per Week:   . Minutes of Exercise per Session:   Stress:   . Feeling of Stress :   Social Connections:   . Frequency of Communication with Friends and Family:   . Frequency of Social Gatherings with Friends and Family:   . Attends Religious Services:   . Active Member of Clubs or Organizations:   . Attends Banker Meetings:   [name] Marital Status:        FALLS in last five office visits:  Fall Risk  07/01/2019  Falls in the past year? 1  Number falls in past yr: 0  Injury with Fall? 1    Health Maintenance reviewed: Immunization History  Administered Date(s) Administered  . Influenza Whole 03/18/2007, 12/31/2015  . Pneumococcal Polysaccharide-23 09/09/2015  . Td  08/13/2005   Health Maintenance Topics with due status: Overdue     Topic Date Due   COVID-19 Vaccine Never done   MAMMOGRAM Never done   COLONOSCOPY Never done   TETANUS/TDAP 08/14/2015       Vital Signs Weight: 118 lb (53.5 kg) Body mass index is 19.05 kg/m. CrCl cannot be calculated (Patient's most recent lab result is older than the maximum 21 days allowed.). Body surface area is 1.58 meters squared. Vitals:   08/28/19 1433  BP: 114/78  Pulse: 73  SpO2: 98%  Weight: 118 lb (53.5 kg)  Height: 5'  6" (1.676 m)   Wt Readings from Last 3 Encounters:  08/28/19 118 lb (53.5 kg)  07/29/19 118 lb 2 oz (53.6 kg)  07/08/19 120 lb (54.4 kg)    Hearing Screening   Method: Audiometry             Right ear:   Left ear:   Vision Screening Comments: Pt is unable to identify her letters or shapes due to her dementia   Physical Examination:  VS reviewed GEN: Alert, Cooperative, Groomed, NAD EXT: No peripheral leg edema.  Neuro: Normal gait, full swing stride, no stepping across body midline Psych: Labile affect with laughing after speaking/thought-possible blocking/speech at times indistinct/language tangential to questions then rambling.  No word salad. Perservating phrases present.   Labs No components found for: VITAMIND  No results found for: VITAMINB12  No results found for: FOLATE  Lab Results  Component Value Date   TSH 0.883 07/29/2019    No results found for: RPR  Lab Results  Component Value Date   HIV Non Reactive 07/08/2019      Chemistry      Component Value Date/Time   NA 147 (H) 07/01/2019 1120   K 4.0 07/01/2019 1120   CL 106 07/01/2019 1120   CO2 26 07/01/2019 1120   BUN 10 07/01/2019 1120   CREATININE 0.74 07/01/2019 1120      Component Value Date/Time   CALCIUM 10.0 07/01/2019 1120   ALKPHOS 54 07/01/2019 1120   AST 18 07/01/2019 1120   ALT 13  07/01/2019 1120   BILITOT 0.5 07/01/2019 1120       CrCl cannot be calculated (Patient's most recent lab result is older than the maximum 21 days allowed.).   Lab Results  Component Value Date   HGBA1C 5.8 (H) 06/26/2018     @  Hearing Screening   Method: Audiometry             Right ear:   Left ear:   Vision Screening Comments: Pt is unable to identify her letters or shapes due to her dementia  Lab Results  Component Value Date   WBC 4.4 07/01/2019   HGB 14.2 07/01/2019   HCT 43.3 07/01/2019   MCV 95 07/01/2019   PLT 319 07/01/2019    No results found for this or any previous visit (from the past 24 hour(s)).  Imaging  Brain MRI: 02/03/2019 [facility]) Age advanced generalized atrophy that has developed since 2007  head CT.  ------------------------------------------------------------------------------------------------------------------------------------------------------------------------------------------------------------------------------------------------------------------------------------------------------------------------------------------------------------------------------------------------------------------------------------------------------------------------------------------------------------  Assessment and Plan: Please see individual consultation notes from physical therapy, pharmacy and social work for today.    Problem List Items Addressed This Visit      High   Caregiver stress   Dementia with behavioral problem (HCC) (Chronic) -    Relevant Medications   risperiDONE (RISPERDAL) 1 MG tablet   Weight loss, unintentional    Established problem worsened.  Weight 09/09/2015 PCP ov Gala Lewandowsky. MD was 248 pounds.          Medium   Alteration in performance of activities of daily living   History of mood disorder    History of mood disorder  Per initial  PCP ov 09/09/15 [facility]: "She was previously on Lexapro  daily (per prior PCP records), which she reports made her "feel calmer".  Low   History of asthma, mild, intermittent (Chronic)     Unprioritized   Apraxia    Other Visit Diagnoses    Psychosis, unspecified psychosis type (HCC)       Relevant Medications   risperiDONE (RISPERDAL) 1 MG tablet     History of mood disorder History of mood disorder  Per initial PCP ov 09/09/15 [facility]: "She was previously on Lexapro  daily (per prior PCP records), which she reports made her "feel calmer".   Weight loss, unintentional Established problem worsened.  Weight 09/09/2015 PCP ov Gala Lewandowsky. MD was 248 pounds.            > 60 minutes face to face were spent in total with interdisciplinary discussion, patient and caretaker counseling and coordination of care took more than 20 minutes. The Geriatric interdisciplinary team meet to discuss the patient's assessment, problem list, and recommendations.  The interdisciplinary team consisted of representatives from medicine, pharmacy, physical therapy and social work. The interdisciplinary team meet with the patient and caretakers to review the team's findings, assessments, and recommendations.

## 2019-09-03 ENCOUNTER — Ambulatory Visit: Payer: Self-pay | Admitting: Licensed Clinical Social Worker

## 2019-09-03 DIAGNOSIS — Z7189 Other specified counseling: Secondary | ICD-10-CM

## 2019-09-03 NOTE — Chronic Care Management (AMB) (Signed)
  Care Management   Clinical Social Work Follow Up   09/03/2019 Name: SHAWNTA SCHLEGEL MRN: [id number] DOB: 08-May-1960 Referred by: [name], DO  Reason for referral : Care Coordination (level of care )  EMONI WHITWORTH is a 59 y.o. year old female who is a primary care patient of [name], [name], DO.  Reason for follow-up: assess for barriers and progress with connecting to [facility] program and psychiatry .  See care plan below for details. Plan:   1. LCSW will F/U with [facility] intake worker in 2 to 3 weeks 2. Daughter will call office if needed  Advance Directive Status: N See Care Plan and Vyn[facility] application for related entries. ; not addressed during this encounter.  SDOH (Social Determinants of Health) assessments performed:No needs identified   Goals Addressed            This Visit's Progress   . Care Giver support and resources   On track    [place] (see longitudinal plan of care for additional care plan information) Current Barriers & Progress:  . Patient with Dementia needs community resources for caregiver  . Acknowledges deficits and needs support, education and care coordination in order to meet this unmet need  . Patient's daughter received and completed CAPS application . Second part left for PCP to complete. Form completed and faxed by PCP . Patient and daughter met with [facility] and completed a site visit they would like to move forward with program . Daughter has not made appointment with [facility]. Clinical Goal(s) Over the next 30 days, Provide patient's daughter with resources and support to assist with meeting need Interventions provided by LCSW:  . Assessed barriers to care and connecting the [facility] and well as progress with [facility] . Collaborated with [facility] intake worker; she continues to work with family for intake process Patient Self Care Activities & Deficits:  . Patient is unable to  independently navigate community resource options without care coordination support  . Acknowledges deficits and is motivated to resolve concern  . Daughter will call to make appointment with [facility]. Dr. McDiarmid wants patient to see Dr. Casimiro Needle Please see past updates related to this goal by clicking on the "Past Updates" button in the selected goal        Outpatient Encounter Medications as of 09/03/2019  Medication Sig  . Ferrous Sulfate (IRON) 325 (65 Fe) MG TABS Take by mouth.  . risperiDONE (RISPERDAL) 1 MG tablet Take 1 tablet (1 mg total) by mouth at bedtime.   No facility-administered encounter medications on file as of 09/03/2019.   Review of patient status, including review of consultants reports, relevant laboratory and other test results, and collaboration with appropriate care team members and the patient's provider was performed as part of comprehensive patient evaluation and provision of care management services.    Casimer Lanius, [place] / [place]   [phone] 11:12 AM

## 2019-09-17 ENCOUNTER — Ambulatory Visit: Payer: Self-pay | Admitting: Licensed Clinical Social Worker

## 2019-09-17 NOTE — Chronic Care Management (AMB) (Signed)
   Social Work  Care Management Collaboration 09/17/2019 Name: IMAGENE BOSS MRN: [id number] DOB: 1960-05-30 KOLLEEN OCHSNER is a 59 y.o. year old female who sees [place], [name], DO for primary care. LCSW was consulted to assistance patient with  Care Coordination Caregiver Stress support and Level of Care Concerns.   Intervention: Patient was not interviewed or contacted during this encounter.  LCSW collaborated with [employer] program. Plan: LCSW will continue to follow patient until transition to [facility]  Review of patient status, including review of consultants reports, relevant laboratory and other test results, and collaboration with appropriate care team members and the patient's provider was performed as part of comprehensive patient evaluation and provision of chronic care management services.    Goals Addressed            This Visit's Progress   . Care Giver support and resources   On track    [place] (see longitudinal plan of care for additional care plan information) Current Barriers & Progress:  . Patient with Dementia needs community resources for caregiver  . Acknowledges deficits and needs support, education and care coordination in order to meet this unmet need  . Patient and daughter met with [facility] and completed a site visit  . They have completed the intake/RN assessment and the FL2 forms have been sent to the state for approval.   . [facility]ACE office has requested medical records Clinical Goal(s) Over the next 30 days, Provide patient's daughter with resources and support to assist with meeting need Interventions provided by LCSW:  . Collaborated with [facility] intake worker; she continues to work with family for intake process Patient Self Care Activities & Deficits:  . Patient is unable to independently navigate community resource options without care coordination support  . Acknowledges deficits and is motivated to resolve concern  . Daughter will call to make  appointment with [facility]. Dr. McDiarmid wants patient to see Dr. Casimiro Needle Please see past updates related to this goal by clicking on the "Past Updates" button in the selected goal      Casimer Lanius, [place] / [place]   [phone] 10:01 AM

## 2019-09-18 ENCOUNTER — Ambulatory Visit: Payer: Medicare Other | Admitting: Family Medicine

## 2019-09-18 ENCOUNTER — Telehealth: Payer: Self-pay | Admitting: Family Medicine

## 2019-09-18 NOTE — Telephone Encounter (Signed)
Called daughter. LVM. Informing of missed appt today.   [name], DO 09/18/2019, 5:12 PM PGY-2, [facility]

## 2019-09-23 ENCOUNTER — Telehealth: Payer: Self-pay

## 2019-09-23 NOTE — Telephone Encounter (Signed)
Received phone call from daughter today regarding placement for mother in assisted living facility. Daughter is requesting updated FL2 form to begin placement process.   Will forward to PCP and Gavin Pound for next steps.   Veronda Prude, RN

## 2019-09-24 ENCOUNTER — Ambulatory Visit: Payer: Medicare Other | Admitting: Licensed Clinical Social Worker

## 2019-09-24 DIAGNOSIS — Z7189 Other specified counseling: Secondary | ICD-10-CM

## 2019-09-24 DIAGNOSIS — Z636 Dependent relative needing care at home: Secondary | ICD-10-CM

## 2019-09-24 NOTE — Chronic Care Management (AMB) (Signed)
  Care Management   Clinical Social Work Follow Up   09/24/2019 Name: Sherry Hicks MRN: [id number] DOB: March 11, 1960 Referred by: [name], DO  Reason for referral : Care Coordination (possible placement)  Sherry Hicks is a 59 y.o. year old female who is a primary care patient of [name], [name], DO.  Reason for follow-up: assess for barriers and progress with caregiver and patient needs.  Patient is scheduled to start the PACE program Sept 1st however daughter is not sure she can wait that long.  Would like to explore all options over the next few weeks to include facility placement. Assessment: Patient's daughter continues to experience difficulty with meeting patient's needs and would like to explore facility placement.     Plan:  1. Facility list given to daughter 2.   FL2 sent to PCP to review and sign 3.   Daughter will call LCSW once she has located a facility   Advance Directive Status: N See [employer] application for related entries. ;  SDOH (Social Determinants of Health) assessments performed:; No needs identified   Goals Addressed            This Visit's Progress   . possible placement       CARE PLAN ENTRY (see longitudinal plan of care for additional care plan information)  Current Barriers:   . Patient with Dementia has Care Coordination Needs related to facility placement for assisted living  . Patient's health has declined and longer able to meet ADL's at home . Daughter and patient Acknowledges deficits with education and support in order to meet this need . Daughter no longer able to care for patient at home . Needs FL2 to start placement process Clinical Social Work Goal(s):  Over the next 30 to 45 days patient will be placed in a facility  Over the next 10 to 20 days, patient and family will visit facilities   Interventions completed by LCSW: . Assessed needs and provided education on level of care and facility placement process . Obtained  verbal permission to fax FL2 to facilities identified by patient's family  . CSW will collaborate with identified facilities and assist patient as needed to understand facility selection process . Collaborated with primary care provider for completion of FL2 . Assisted patient with faxing FL2 to identified facilities once completed and signed by PCP . Provided family with list of ALF facilities  Patient Self Care Activities & Deficits:  . Patient/ Family Knowledge deficits and is unable to perform appropriate self care  . Patient does not have consistent support to assist with appropriate and safe care at home.  . Patient and/or designated personal representative will select a facility from the list provided or be willing to take the first available facility Initial goal documentation      Outpatient Encounter Medications as of 09/24/2019  Medication Sig  . Ferrous Sulfate (IRON) 325 (65 Fe) MG TABS Take by mouth.  . risperiDONE (RISPERDAL) 1 MG tablet Take 1 tablet (1 mg total) by mouth at bedtime.   No facility-administered encounter medications on file as of 09/24/2019.   Review of patient status, including review of consultants reports, relevant laboratory and other test results, and collaboration with appropriate care team members and the patient's provider was performed as part of comprehensive patient evaluation and provision of care management services.    Sammuel Hines, LCSW Care Management & Coordination  [facility] / [facility]   [phone] 4:01 PM

## 2019-09-24 NOTE — Telephone Encounter (Signed)
    Clinical Social Work  Care Management Outreach   09/24/2019 Name: Sherry Hicks MRN: [id number] DOB: [birth year]  Sherry Hicks is a 59 y.o. year old female who is a primary care patient of [name], [name], DO .  The Care Management team was consulted for assistance with Level of Care Concerns for ALF placement.  LCSW reached out to [employer] daughter today by phone to obtain additional information in order to move forward with the request.    The outreach was unsuccessful. A HIPPA compliant phone message was left for the patient providing contact information and requesting a return call.  Plan: LCSW will wait for return call  Review of patient status, including review of consultants reports, relevant laboratory and other test results, and collaboration with appropriate care team members and the patient's provider was performed as part of comprehensive patient evaluation and provision of care management services.    Sammuel Hines, LCSW Care Management & Coordination  [facility] / [facility]   [phone] 8:55 AM

## 2019-09-24 NOTE — [place] FL2 (Signed)
  [place] MEDICAID FL2 LEVEL OF CARE SCREENING TOOL     IDENTIFICATION  Patient Name: ARMETTA HENRI Birthdate: 1960-03-29 Sex: female Admission Date (Current Location): (Not on file)  [name] Number:  [name] [id number] [facility] Facility and Address:         Provider Number: [id number]  Attending Physician Name and Address:  Dr. [name] [name] [address] [place]   Relative Name and Phone Number:  Scharlene Catalina  (POA) [phone]    Current Level of Care: Home Recommended Level of Care: Assisted Living Facility Prior Approval Number:    Date Approved/Denied:   PASRR Number:    Discharge Plan:      Current Diagnoses: Patient Active Problem List   Diagnosis Date Noted  . History of mood disorder 08/29/2019  . Apraxia 08/29/2019  . Alteration in performance of activities of daily living 08/29/2019  . Caregiver stress 08/29/2019  . Early Onset Dementia with behavioral problem (HCC)   . Hyperlipidemia 07/30/2019  . Weight loss, unintentional 07/06/2019  . Hypernatremia 07/06/2019  . History of asthma, mild, intermittent 2017  . Major depressive disorder, recurrent episode (HCC) 04/12/2006  . TOBACCO DEPENDENCE 04/12/2006  . HYPERTENSION, BENIGN SYSTEMIC 04/12/2006    Orientation RESPIRATION BLADDER Height & Weight     Self, Time, Situation, Place  Normal Incontinent Weight:  118 Height:     BEHAVIORAL SYMPTOMS/MOOD NEUROLOGICAL BOWEL NUTRITION STATUS      Incontinent Diet no salt added  AMBULATORY STATUS COMMUNICATION OF NEEDS Skin   Independent Verbally Normal                       Personal Care Assistance Level of Assistance  Bathing, Feeding, Dressing Bathing Assistance: Limited assistance Feeding assistance: Independent Dressing Assistance: Limited assistance     Functional Limitations Info  Sight, Hearing, Speech Sight Info: Adequate Hearing Info: Adequate Speech Info: Adequate    SPECIAL CARE FACTORS FREQUENCY                        Contractures Contractures Info: Not present    Additional Factors Info  Allergies, Code Status Code Status Info: Full Allergies Info: Hydrocodone-acetaminophen ;Donepezil           Current Medications (09/24/2019):  Current Outpatient Medications  Medication Sig Dispense Refill  . Ferrous Sulfate (IRON) 325 (65 Fe) MG TABS Take by mouth.    . risperiDONE (RISPERDAL) 1 MG tablet Take 1 tablet (1 mg total) by mouth at bedtime. 30 tablet 1   No current facility-administered medications for this visit.    Additional Information SS# [id number]  [name], LCSW

## 2019-09-25 ENCOUNTER — Telehealth: Payer: Self-pay | Admitting: Licensed Clinical Social Worker

## 2019-09-25 NOTE — Chronic Care Management (AMB) (Signed)
    Clinical Social Work  Care Management Outreach   09/25/2019 Name: ALYIAH ULLOA MRN: [id number] DOB: [birth year]  Ginette Pitman Willig is a 59 y.o. year old female who is a primary care patient of [name], Simone, DO .   LCSW received call from [employer] with [facility] CAP program [phone].  Reports she has not been able to reach patient or daugher reference CAP application.  LCSW reached out to [employer] daughter today by phone to provide an update on the CAP program.  The outreach was unsuccessful. A HIPPA compliant phone message was left for the patient's daughter providing contact information for CAP program and requesting a return call.  Intervention: LCSW contacted CAPS worker to provide contact information for patient and daughter.  Plan: Will wait for return call from patient's daughter, if no return call will F/U in 1 t o2 weeks  Review of patient status, including review of consultants reports, relevant laboratory and other test results, and collaboration with appropriate care team members and the patient's provider was performed as part of comprehensive patient evaluation and provision of care management services.    Sammuel Hines, LCSW Care Management & Coordination  [facility] / [facility]   [phone] 8:58 AM

## 2019-09-30 ENCOUNTER — Ambulatory Visit: Payer: Medicare Other | Admitting: Licensed Clinical Social Worker

## 2019-09-30 DIAGNOSIS — Z7189 Other specified counseling: Secondary | ICD-10-CM

## 2019-09-30 DIAGNOSIS — Z741 Need for assistance with personal care: Secondary | ICD-10-CM

## 2019-09-30 DIAGNOSIS — Z719 Counseling, unspecified: Secondary | ICD-10-CM

## 2019-10-01 NOTE — Chronic Care Management (AMB) (Signed)
Care Management   Clinical Social Work Follow Up   10/01/2019 Name: Sherry Hicks MRN: [id number] DOB: 12-Oct-1960 Referred by: Sherry Hicks  Reason for referral : Care Coordination  Sherry Hicks is a 59 y.o. year old female who is a primary care patient of [place], [name], Hicks.  Reason for follow-up: assess for barriers and progress with caregiver support  .   Assessment: Patient's daugter continues to experience difficulty with accomplishing goals in care plan. See plan below for details Plan:  1. LCSW will wait for return call 2.   If no return call in 1 week will F/U with patient's daughter  Advance Directive Status: N See  Vyn[employer] application for related entries. ; .  SDOH (Social Determinants of Health) assessments performed ; No needs identified   Goals Addressed            This Visit's Progress   . Care Giver support and resources   Not on track    [place] (see longitudinal plan of care for additional care plan information) Current Barriers & Progress:  . Patient with Dementia needs community resources for caregiver  . Acknowledges deficits and needs support, education and care coordination in order to meet this unmet need  . They have completed the intake/RN assessment and the FL2 forms have been sent to the state for approval.   . Daughter has decided not to go with the PACE Program . Daughter has not returned call to [facility] Worker, reports has not checked voice mail Clinical Goal(s) Over the next 30 days, Provide patient's daughter with resources and support to assist with meeting need Interventions provided by LCSW:  . Provided update that CAP worker has been trying to contact patient's daughter to move forward with CAP application( has not been able to reach her) . LCSW provider daughter with update and contact number for CAP worker in [place].  Patient Self Care Activities & Deficits:  . Patient is unable to independently navigate  community resource options without care coordination support  . Acknowledges deficits and is motivated to resolve concern  . Daughter will call to make appointment with [facility]. Dr. McDiarmid wants patient to see Dr. Casimiro Needle . Daughter will call CAPS worker and call LCSW back Please see past updates related to this goal by clicking on the "Past Updates" button in the selected goal     . [place] (see longitudinal plan of care for additional care plan information)  Current Barriers & Progress:   . Patient with HTN and Dementia needs Support, Education, and Care Coordination to resolve unmet Personal Care needs . Daughter continues to express need for personal care assistance with patient  . PCS assessment completed June 14th  family continues to wait for PCS services to start . PCS agency met with patient and family completed assessment  . Personal care services were scheduled to start but no one showed up  . Daughter has contacted [place] Work Goal(s):  [name] Over the next 30 to 45 days, patient will have personal care needs met as evident by having PCS Aide in the home assisting with needs. . Over the next 30 days patient will work with LCSW, and [facility] to coordinate care for [facility] and select a personal care service provider  Interventions provided by LCSW : . Assessed needs and barriers with connecting to [facility].  Patient Self Care Activities & Deficits:  . Patient is unable to perform ADLs independently without assistance  . Family/support system will assist patient with meeting needs until [facility] is approved . Call [facility] with questions [phone] or [phone]  . Follow up with PCS agency to find out start date for services Please see past updates related to this goal by clicking on the "Past Updates" button in the selected goal     . possible placement   Not on track     [place] (see longitudinal plan of care for additional care plan information)  Current Barriers:   . Patient with Dementia has Care Coordination Needs related to facility placement for assisted living  . Patient's health has declined and longer able to meet ADL's at home . Daughter and patient Acknowledges deficits with education and support in order to meet this need . Daughter no longer able to care for patient at home . Needs FL2 to start placement process . Has not located a facility Clinical Social Work Goal(s):  Over the next 30 to 45 days patient will be placed in a facility  Over the next 10 to 20 days, patient and family will visit facilities   Interventions completed by LCSW: . Assessed needs and provided education on level of care and facility placement process . Obtained verbal permission to fax FL2 to facilities identified by patient's family  . CSW will collaborate with identified facilities and assist patient as needed to understand facility selection process . Collaborated with primary care provider for completion of FL2 . Assisted patient with faxing FL2 to identified facilities once completed and signed by PCP . Provided family with list of ALF facilities  Patient Self Care Activities & Deficits:  . Patient/ Family Knowledge deficits and is unable to perform appropriate self care  . Patient does not have consistent support to assist with appropriate and safe care at home.  . Patient and/or designated personal representative will select a facility from the list provided or be willing to take the first available facility Please see past updates related to this goal by clicking on the "Past Updates" button in the selected goal       Outpatient Encounter Medications as of 09/30/2019  Medication Sig  . Ferrous Sulfate (IRON) 325 (65 Fe) MG TABS Take by mouth.  . risperiDONE (RISPERDAL) 1 MG tablet Take 1 tablet (1 mg total) by mouth at bedtime.   No  facility-administered encounter medications on file as of 09/30/2019.   Review of patient status, including review of consultants reports, relevant laboratory and other test results, and collaboration with appropriate care team members and the patient's provider was performed as part of comprehensive patient evaluation and provision of care management services.    Casimer Lanius, [place] / [place]   [phone] 8:28 AM

## 2019-10-09 ENCOUNTER — Ambulatory Visit: Payer: Medicare Other | Admitting: Licensed Clinical Social Worker

## 2019-10-09 DIAGNOSIS — Z7189 Other specified counseling: Secondary | ICD-10-CM

## 2019-10-09 DIAGNOSIS — Z636 Dependent relative needing care at home: Secondary | ICD-10-CM

## 2019-10-09 NOTE — Chronic Care Management (AMB) (Signed)
  Care Management   Clinical Social Work Follow Up   10/09/2019 Name: Sherry Hicks MRN: [id number] DOB: [birth year] Referred by: [name], DO  Reason for referral : Care Coordination (placement)  Sherry Hicks is a 59 y.o. year old female who is a primary care patient of [name], [name], DO.  Reason for follow-up: assess for barriers and progress with facility placment .   Assessment: Patient's daughter is making progress towards goal. States she has located a facility and requesting FL2 be faxed .  Plan: Daughter will contact LCSW if additional information is needed Advance Directive Status: N See  Vynca application for related entries.  SDOH (Social Determinants of Health) assessments performed: No needs identified   Goals Addressed            This Visit's Progress   . possible placement   On track    CARE PLAN ENTRY (see longitudinal plan of care for additional care plan information)  Current Barriers:   . Patient with Dementia has Care Coordination Needs related to facility placement for assisted living  . Patient's health has declined and longer able to meet ADL's at home . Daughter and patient Acknowledges deficits with education and support in order to meet this need . Daughter no longer able to care for patient at home . Needs FL2 to start placement process . Daughter and patient has located a facility [facility]) Clinical Social Work Goal(s):  Over the next 30 to 45 days patient will be placed in a facility  Over the next 10 to 20 days, patient and family will visit facilities   Interventions completed by LCSW: . Assessed needs and provided education on level of care and facility placement process . Obtained verbal permission to fax FL2 and supporting documentation to facilities identified by patient and daughter   . CSW will collaborate with identified facilities and assist patient as needed to understand facility selection process . Collaborated  with primary care provider for completion of FL2 .  FL2 faxed to [facility] Patient Self Care Activities & Deficits:  . Patient/ Family Knowledge deficits and is unable to perform appropriate self care  . Patient does not have consistent support to assist with appropriate and safe care at home.  . Patient and/or designated personal representative will select a facility from the list provided or be willing to take the first available facility Please see past updates related to this goal by clicking on the "Past Updates" button in the selected goal       Outpatient Encounter Medications as of 10/09/2019  Medication Sig  . Ferrous Sulfate (IRON) 325 (65 Fe) MG TABS Take by mouth.  . risperiDONE (RISPERDAL) 1 MG tablet Take 1 tablet (1 mg total) by mouth at bedtime.   No facility-administered encounter medications on file as of 10/09/2019.   Review of patient status, including review of consultants reports, relevant laboratory and other test results, and collaboration with appropriate care team members and the patient's provider was performed as part of comprehensive patient evaluation and provision of care management services.    Sammuel Hines, LCSW Care Management & Coordination  [facility] / [facility]   [phone] 3:30 PM

## 2019-10-14 ENCOUNTER — Other Ambulatory Visit: Payer: Self-pay

## 2019-10-14 ENCOUNTER — Encounter: Payer: Self-pay | Admitting: Neurology

## 2019-10-14 ENCOUNTER — Ambulatory Visit (INDEPENDENT_AMBULATORY_CARE_PROVIDER_SITE_OTHER): Payer: Medicare Other | Admitting: Neurology

## 2019-10-14 VITALS — BP 123/87 | HR 88 | Ht 66.0 in | Wt 107.0 lb

## 2019-10-14 DIAGNOSIS — F0391 Unspecified dementia with behavioral disturbance: Secondary | ICD-10-CM | POA: Diagnosis not present

## 2019-10-14 DIAGNOSIS — F03B18 Unspecified dementia, moderate, with other behavioral disturbance: Secondary | ICD-10-CM

## 2019-10-14 MED ORDER — MIRTAZAPINE 15 MG PO TABS: 15.0000 mg | ORAL_TABLET | Freq: Every day | ORAL | 1 refills | Status: DC

## 2019-10-14 NOTE — Progress Notes (Deleted)
NEUROLOGY CONSULTATION NOTE  Sherry KeJennifer A Pontillo MRN: [id number] DOB: [id number]  Referring provider: Dr. Terisa Starrarina Brown Primary care provider: Randa EvensSimone [name]-DO  Reason for consult:  Altered mental status, dementia   Dear Dr Manson PasseyBrown:  Thank you for your kind referral of Sherry Hicks for consultation of the above symptoms. Although her history is well known to you, please allow me to reiterate it for the purpose of our medical record. The patient was accompanied to the clinic by *** who also provides collateral information. Records and images were personally reviewed where available.   HISTORY OF PRESENT ILLNESS: ***.    Scratches her body up keeping arms flexed over chest crossed with hands over her neck; in her room most of the time; when asleep relaxes Does not sleep, wandering around the house Has fallend down steps wandering Oct 2020, going downhill since then Was living alone in [place], does not sure what happened there, had a fall in Feb 2020 and stayed with her until May, took her back, then got a call that she was wandering around, not taking baths, bad behavior, stayed with sister for a month, then dropped off in Oct Dx with dementia, sent with bunch of meds which she think smade her worse When she got back in Nov from inpt pysch, she was incontinent with bowel/bladder Since top of year gradually going downhill Sleep issues started in Jan; can feed herself but makes a mess so they just feed her; does not use utensils, digs her hands, stuff falling on the floor  Last normal , honestly felt like she was into drugs, not focusing when called her; 2018 was the last "mother I know" grandmother, mother has dementia; thinks trauma and depression,  Smoked weed, got word she was using crack cocaine Stopped risperdal bec makes her not focused and harder to talk to; last month, gotten way worse when she started the med, not listening, not being comprehensive to things, stopped it 3 week  and  Has to keep her in a room, likes listening to music Hallucinating, talks to wall, reflection on mirror, curses to TV thinking it is talking to her Are you sleey: not really, says "ok" Gets disability for depression, had episode when younger and was dx with depression and tx with medication 2017: House in [place] was a mess, trash everywhere, dishes in sink; something different, no cognitive changes Found needles in her home Took depends off and smeared feces everywhere Trying to get [facility]  Not following Bloody left lower lip Scratch on chest Rigid Slow and cautious, stiff Bday: Apr 05, 1960 Says some words, i'm 13, i'm just playing   08/2019: Patient has been living in [place], [place] since approximately 2014 when she left [place]. Her dgt remained in [place].  Her Dgt has had intermittent contact, mostly by phone, with her mother. Chari ManningJaQuil has little knowledge of her mother's activities while in [place], including any health care she received. She has concern that her mother came into contact with illicit substance(s) that caused this major change in her mother. Chari ManningJaQuil states she has no way or confirming this suspicion.   While Chari ManningJaQuil thought there had been some strange phone interactions with her mother over the last few years, it was not until March 2020 when she brought her mother back to [place] to recover after a fall with ankle injury requiring surgery that Chari ManningJaQuil noted a big change in her mother's behaviors and personality had happened.  [name] reports  that the [place] SNF from which she picked up her mother told her that her mother had dementia and would require 24/7 care. [name] had never before been told her mother had dementia. [name] links this fall with ankle injury as the start of her mothers cognitive and functional decline.   She established primary care at [facility] in May 2020. There her weight was 225 pounds. [place]  practice referred her to [facility] but the patient [facility], perhaps because she was by then back in [place]. An offered referral to psychiatry was declined.   In May 2020, at her mother's request, [name] returned her mother to live in [place].  In September 2020, [name] was contacted by a neighbor of her mother in [place].  She learned that her mother was wandering about the neighborhood, appearing confused.  [name] brought her mother to [place], and has cared for her ever since.   There is a Claims Encounter in Epic for an Observation Admission for "Brief Psychotic Disorder" on 10/31/18 - 11/01/18 by a psychiatrist in [place], [place], region. The patient must have then been transferred to [facility] wear she the Claim Encounter included diagnosis with altered mental status, and Bipolar Disorder (NOS).   Soon after coming to live in [place] for the second time in 2020, Kymberlie was brought in by [facility]  to [facility] ED on 11/18/2018.  She was more confused and had been behaving aggressively with a knife.  [facility] evaluated her in the ED.Geriatric psychiatry inpatient admission was recommended. She was transferred to [facility] in [place], [place], under involuntary committal. The accepting psychiatrist was Dr Donata Clay.  [name] was upset that the transfer was made without her permission.  Patient was later discharge to the care of her dgt. [name] stopped the several discharge psychiatric medications from [facility] facility bc her mother was too sedated.   Early December, Ms Brissette was evaluated at [facility] Neurology department, by [facility], NP, for memory loss. Her MoCA score was 3 out of 30.  The neurology note reported a patient history of drug use though I am uncertain from where this information originated .  The brain MRI at [facility] showed age advanced generalized atrophy that is new since 2007 head CT.  There were no acute or reversible findings.  She was diagnoses with Alzheimer's dementia and untreated psychiatric disorder(s) by neurology specialist.   The patient was started on donepezil and a referral to psychiatry was ordered.   [name] stopped giving her mother the donepezil because she was incontinent when taking it.   Laboratory Data: MRI brain 01/2019: Generalized cortical atrophy has developed, age advanced.  Cavum septum pellucidum et vergae, incidental. No infarct, blood  products, white matter disease, hydrocephalus, collection, or  masslike finding.     PAST MEDICAL HISTORY: Past Medical History:  Diagnosis Date  . Alteration in performance of activities of daily living 08/29/2019  . Apraxia 08/29/2019  . Arthritis   . Dementia (HCC)   . Dementia with behavioral problem (HCC)   . History of asthma, mild, intermittent 02/14/2015  . Hypertension   . Smoker     PAST SURGICAL HISTORY: Past Surgical History:  Procedure Laterality Date  . ANKLE SURGERY    . SALPINGOOPHORECTOMY    . TOTAL ABDOMINAL HYSTERECTOMY W/ BILATERAL SALPINGOOPHORECTOMY     for menorrhagia and pregnancy loss) and no longer requires GYN screening    MEDICATIONS: Current Outpatient Medications on File  Prior to Visit  Medication Sig Dispense Refill  . Ferrous Sulfate (IRON) 325 (65 Fe) MG TABS Take by mouth.    . risperiDONE (RISPERDAL) 1 MG tablet Take 1 tablet (1 mg total) by mouth at bedtime. 30 tablet 1   No current facility-administered medications on file prior to visit.    ALLERGIES: Allergies  Allergen Reactions  . Hydrocodone-Acetaminophen Nausea And Vomiting  . Donepezil Other (See Comments)    Urinary Incontinence    FAMILY HISTORY: Family History  Problem Relation Age of Onset  . Hypertension Mother   . Dementia Mother   . Asthma Father   . Cancer Father   . Canavan disease Sister   . Cancer Brother   . Diabetes Maternal Grandmother   . Hypertension Maternal  Grandmother   . Asthma Paternal Grandfather   . Cancer Paternal Grandfather   . Cancer Other   . Diabetes Other   . Dementia Paternal Grandmother     SOCIAL HISTORY: Social History   Socioeconomic History  . Marital status: Single    Spouse name: Not on file  . Number of children: 1  . Years of education: 22  . Highest education level: Not on file  Occupational History  . Occupation: Disability     Comment: Optician, dispensing Disability payments  Tobacco Use  . Smoking status: Current Every Day Smoker  . Smokeless tobacco: Never Used  Substance and Sexual Activity  . Alcohol use: No  . Drug use: No  . Sexual activity: Not Currently    Partners: Male  Other Topics Concern  . Not on file  Social History Narrative        Moved from [place] Oct. 2020. Lives with daughter who is POA ( copy of paper work provided)   Social Determinants of Corporate investment banker Strain:   . Difficulty of Paying Living Expenses: Not on file  Food Insecurity:   . Worried About Programme researcher, broadcasting/film/video in the Last Year: Not on file  . Ran Out of Food in the Last Year: Not on file  Transportation Needs:   . Lack of Transportation (Medical): Not on file  . Lack of Transportation (Non-Medical): Not on file  Physical Activity:   . Days of Exercise per Week: Not on file  . Minutes of Exercise per Session: Not on file  Stress:   . Feeling of Stress : Not on file  Social Connections:   . Frequency of Communication with Friends and Family: Not on file  . Frequency of Social Gatherings with Friends and Family: Not on file  . Attends Religious Services: Not on file  . Active Member of Clubs or Organizations: Not on file  . Attends Banker Meetings: Not on file  . Marital Status: Not on file  Intimate Partner Violence:   . Fear of Current or Ex-Partner: Not on file  . Emotionally Abused: Not on file  . Physically Abused: Not on file  . Sexually Abused: Not on file     REVIEW OF SYSTEMS: Constitutional: No fevers, chills, or sweats, no generalized fatigue, change in appetite Eyes: No visual changes, double vision, eye pain Ear, nose and throat: No hearing loss, ear pain, nasal congestion, sore throat Cardiovascular: No chest pain, palpitations Respiratory:  No shortness of breath at rest or with exertion, wheezes GastrointestinaI: No nausea, vomiting, diarrhea, abdominal pain, fecal incontinence Genitourinary:  No dysuria, urinary retention or frequency Musculoskeletal:  No neck pain, back pain Integumentary:  No rash, pruritus, skin lesions Neurological: as above Psychiatric: No depression, insomnia, anxiety Endocrine: No palpitations, fatigue, diaphoresis, mood swings, change in appetite, change in weight, increased thirst Hematologic/Lymphatic:  No anemia, purpura, petechiae. Allergic/Immunologic: no itchy/runny eyes, nasal congestion, recent allergic reactions, rashes  PHYSICAL EXAM: There were no vitals filed for this visit. General: No acute distress Head:  Normocephalic/atraumatic Eyes: Fundoscopic exam shows bilateral sharp discs, no vessel changes, exudates, or hemorrhages Neck: supple, no paraspinal tenderness, full range of motion Back: No paraspinal tenderness Heart: regular rate and rhythm Lungs: Clear to auscultation bilaterally. Vascular: No carotid bruits. Skin/Extremities: No rash, no edema Neurological Exam: Mental status: alert and oriented to person, place, and time, no dysarthria or aphasia, Fund of knowledge is appropriate.  Recent and remote memory are intact.  Attention and concentration are normal.    Able to name objects and repeat phrases. Cranial nerves: CN I: not tested CN II: pupils equal, round and reactive to light, visual fields intact, fundi unremarkable. CN III, IV, VI:  full range of motion, no nystagmus, no ptosis CN V: facial sensation intact CN VII: upper and lower face symmetric CN VIII: hearing intact  to finger rub CN IX, X: gag intact, uvula midline CN XI: sternocleidomastoid and trapezius muscles intact CN XII: tongue midline Bulk & Tone: normal, no fasciculations. Motor: 5/5 throughout with no pronator drift. Sensation: intact to light touch, cold, pin, vibration and joint position sense.  No extinction to double simultaneous stimulation.  Romberg test *** Deep Tendon Reflexes: +2 throughout, no ankle clonus Plantar responses: downgoing bilaterally Cerebellar: no incoordination on finger to nose, heel to shin. No dysdiadochokinesia Gait: narrow-based and steady, able to tandem walk adequately. Tremor: ***  IMPRESSION: This is a *** year old ***-handed *** with a history of ***.  Return to clinic in *** months. ***  The duration of this appointment visit was *** minutes of face-to-face time with the patient.  Greater than 50% of this time was spent in counseling, explanation of diagnosis, planning of further management, and coordination of care.  Thank you for allowing me to participate in the care of this patient. Please do not hesitate to call for any questions or concerns.   Patrcia Dolly, M.D.  CC: ***

## 2019-10-14 NOTE — Patient Instructions (Signed)
1. Schedule routine EEG  2. Try mirtazapine 15mg  every night and see if this helps her sleep/rest better  3. Proceed with placement  4. Follow-up as needed, call for any changes

## 2019-10-15 ENCOUNTER — Encounter: Payer: Self-pay | Admitting: Neurology

## 2019-10-15 NOTE — Progress Notes (Signed)
NEUROLOGY CONSULTATION NOTE  Sherry Hicks MRN: [id number] DOB: [id number]  Referring provider: Dr. Terisa Hicks Primary care provider: [name] Hicks-DO  Reason for consult:  Altered mental status, dementia   Dear Dr Manson Passey:  Thank you for your kind referral of Sherry Hicks for consultation of the above symptoms. Although her history is well known to you, please allow me to reiterate it for the purpose of our medical record. The patient was accompanied to the clinic by her daughter [name] who also provides collateral information. Records and images were personally reviewed where available.   HISTORY OF PRESENT ILLNESS: This is an unfortunate 59 year old right-handed woman with a history of depression, presenting for evaluation of altered mental status/dementia. She has minimal verbal output and is unable to provide history. She is sitting hugging herself with arms crossed over her chest and hands wrapped around her neck majority of the time. History obtained from daughter [name] and records on EPIC.  [name] reports that she stays in this position and scratches her back/arms/chest. She relaxes from this position when asleep. [name] feels symptoms started around 2017. She had been living in [place] and was previously very organized at home, however when [place] dropped by that time, she noticed that the house was a mess, there was trash everywhere, dishes on the sink, which was very unusual. She felt something was different but did not note any cognitive changes. The last time she talked to the "mother I know" was in 2018. Since then, [name] felt that she was not focusing when they spoke on the phone. She had a fall in February 2020 and injured her ankle, requiring surgery. She stayed with [name] until May, then returned to [place]. [name] got a phone call in September 2020 that she was wandering around, not bathing, with bad behaviors. She stayed with her sister for a month, then moved  in with [place] in October 2020. She was in the ER in September 2020 and October 2020 for psychosis. In October, she had cut a hole in her mattress with a knife and was acting aggressively. She was tangential in the ER, thinking she was in a [facility]. She was transferred to [facility] inpatient Psychiatry and when discharged home in November, [place] reports significant worsening where she was now incontinent of stool and urine. [name] reports she was diagnosed with dementia and sent home on a "bunch of medications" which [name] feels made her worse. She was evaluated at [facility]rology in December 2020, [facility] score was 3/30. It was felt she has Alzheimer's disease as well as an untreated psychiatric disorder, history of drug use. She was started on Donepezil. She had an MRI Brain without contrast in 01/2019 which showed age advanced generalized atrophy, no acute changes. On her PCP visit in January, she is listed as taking Donepezil and Trazodone, and was also noted to have a 47 lb weight loss in 5 months. [name] states she makes a mess when she feeds herself, she does not use utensils and digs her hands in the food, so they have to feed her. She started having sleep difficulties in January. Donepezil and Trazodone stopped in May. [name] reports sleep issues, she does not sleep, wandering around the house. She has fallen down the steps due to wandering. They have to keep her in her room, she likes listening to music. She was hallucinating, talking to the wall, her reflection in the mirror, cursing at the TV, thinking it was talking  to her. She would take off her Depends and smear feces everywhere. She was seen by Geriatrics in July and started on Risperdal, however [name] felt it made her less focused, harder to talk to. She would not listen and seemed to have more difficulty comprehending even more, so it was stopped after 3 weeks.  [name] feels that her mother was into drugs. She smoked weed and  [name] had gotten word she was using crack cocaine. [name] had found needles in her mother's home when she moved her. She has a history of depression and is on disability for depression. Her mother and grandmother had dementia.      PAST MEDICAL HISTORY: Past Medical History:  Diagnosis Date  . Alteration in performance of activities of daily living 08/29/2019  . Apraxia 08/29/2019  . Arthritis   . Dementia (HCC)   . Dementia with behavioral problem (HCC)   . History of asthma, mild, intermittent 02/14/2015  . Hypertension   . Smoker     PAST SURGICAL HISTORY: Past Surgical History:  Procedure Laterality Date  . ANKLE SURGERY    . SALPINGOOPHORECTOMY    . TOTAL ABDOMINAL HYSTERECTOMY W/ BILATERAL SALPINGOOPHORECTOMY     for menorrhagia and pregnancy loss) and no longer requires GYN screening    MEDICATIONS: Current Outpatient Medications on File Prior to Visit  Medication Sig Dispense Refill  . Ferrous Sulfate (IRON) 325 (65 Fe) MG TABS Take by mouth.    . risperiDONE (RISPERDAL) 1 MG tablet Take 1 tablet (1 mg total) by mouth at bedtime. 30 tablet 1   No current facility-administered medications on file prior to visit.    ALLERGIES: Allergies  Allergen Reactions  . Hydrocodone-Acetaminophen Nausea And Vomiting  . Donepezil Other (See Comments)    Urinary Incontinence    FAMILY HISTORY: Family History  Problem Relation Age of Onset  . Hypertension Mother   . Dementia Mother   . Asthma Father   . Cancer Father   . Canavan disease Sister   . Cancer Brother   . Diabetes Maternal Grandmother   . Hypertension Maternal Grandmother   . Asthma Paternal Grandfather   . Cancer Paternal Grandfather   . Cancer Other   . Diabetes Other   . Dementia Paternal Grandmother     SOCIAL HISTORY: Social History   Socioeconomic History  . Marital status: Single    Spouse name: Not on file  . Number of children: 1  . Years of education: 24  . Highest education level: Not on  file  Occupational History  . Occupation: Disability     Comment: Optician, dispensing Disability payments  Tobacco Use  . Smoking status: Current Every Day Smoker  . Smokeless tobacco: Never Used  Substance and Sexual Activity  . Alcohol use: No  . Drug use: No  . Sexual activity: Not Currently    Partners: Male  Other Topics Concern  . Not on file  Social History Narrative        Moved from [place] Oct. 2020. Lives with daughter who is POA ( copy of paper work provided)   Social Determinants of Corporate investment banker Strain:   . Difficulty of Paying Living Expenses: Not on file  Food Insecurity:   . Worried About Programme researcher, broadcasting/film/video in the Last Year: Not on file  . Ran Out of Food in the Last Year: Not on file  Transportation Needs:   . Lack of Transportation (Medical): Not on file  .  Lack of Transportation (Non-Medical): Not on file  Physical Activity:   . Days of Exercise per Week: Not on file  . Minutes of Exercise per Session: Not on file  Stress:   . Feeling of Stress : Not on file  Social Connections:   . Frequency of Communication with Friends and Family: Not on file  . Frequency of Social Gatherings with Friends and Family: Not on file  . Attends Religious Services: Not on file  . Active Member of Clubs or Organizations: Not on file  . Attends Banker Meetings: Not on file  . Marital Status: Not on file  Intimate Partner Violence:   . Fear of Current or Ex-Partner: Not on file  . Emotionally Abused: Not on file  . Physically Abused: Not on file  . Sexually Abused: Not on file    PHYSICAL EXAM: Vitals:   10/14/19 0924  BP: 123/87  Pulse: 88  SpO2: 100%   General: patient appears uncomfortable, sitting quietly but head is bent down hugging herself with arms crossed over chest and hands wrapped around her neck.  Head:  Normocephalic/atraumatic Skin/Extremities: No rash, no edema. She has abrasions on her lower lip, chest, back and  arms ([name] reports she scratches herself in this position) Neurological Exam: Mental status: alert and oriented to person, birthday (with repeated prompting). She has minimal verbal output, answers "not really" when asked if sleepy, makes a joke that she is "82" when age asked, says "I'm just playing," but cannot give age. Difficulty following commands. Fund of knowledge is reduced. Attention and concentration are reduced. Unable to name objects. Cranial nerves: CN I: not tested CN II: pupils equal, round and reactive to light, visual fields intact to visual threat CN III, IV, VI:  full range of motion, no nystagmus, no ptosis CN VII: upper and lower face symmetric CN VIII: hearing intact to conversation Bulk & Tone: increased tone, no fasciculations. Motor: unable to do formal testing, moves all extremities symmetrically. Deep Tendon Reflexes: +2 throughout Cerebellar: no incoordination on finger to nose testing, difficulty following commands Gait: slow and cautious, stiff when walking Tremor: none   IMPRESSION: This is an unfortunate 59 year old right-handed woman with a history of depression, presenting for evaluation of altered mental status/dementia. It appears symptoms may have started in 2017 with behavioral changes, there may be illicit drug use involved (daughter found needles in her home), however symptoms have progressed to the point where she is unable to care for herself, with wandering behavior, hallucinations, and sleep difficulties. Etiology unclear, MRI brain in 2020 reported age-advanced diffuse atrophy, no acute changes. Early onset Alzheimer's disease with behavioral disturbance is a possibility, there may also be an underlying psychiatric condition (daughter suspects there was illicit drug use). We discussed doing an EEG.  Her daughter has caregiver fatigue, support provided, agree with need for higher level of care, she will be moving to a SNF soon. Her daughter is hesitant  about medications, she feels medications had caused worsening symptoms. She is agreeable to a trial of mirtazapine 15mg  qhs for sleep difficulties and appetite. Continue follow-up with PCP, she knows to call for any changes.   Thank you for allowing me to participate in the care of this patient. Please do not hesitate to call for any questions or concerns.   , M.D.  CC: Dr. Patrcia Dolly, Dr. Salvadore Dom

## 2019-10-16 ENCOUNTER — Ambulatory Visit (INDEPENDENT_AMBULATORY_CARE_PROVIDER_SITE_OTHER): Payer: Medicare Other | Admitting: Family Medicine

## 2019-10-16 DIAGNOSIS — Z5329 Procedure and treatment not carried out because of patient's decision for other reasons: Secondary | ICD-10-CM

## 2019-10-16 NOTE — Progress Notes (Signed)
  Patient no-showed to appointment / late cancellation 10/16/2019.   Health maintenance: Patient due for COVID vaccine, flu shot, Tdap, and colonoscopy.  Also due for mammogram, order placed June 2021.    Dollene Cleveland, DO [facility]

## 2019-11-05 ENCOUNTER — Other Ambulatory Visit: Payer: Medicare Other

## 2019-11-06 ENCOUNTER — Telehealth: Payer: Self-pay | Admitting: Neurology

## 2019-11-06 NOTE — Telephone Encounter (Signed)
Called patient and left a message to call back to reschedule her missed routine EEG.

## 2019-11-11 ENCOUNTER — Ambulatory Visit: Payer: Medicare Other | Admitting: Licensed Clinical Social Worker

## 2019-11-11 DIAGNOSIS — Z7189 Other specified counseling: Secondary | ICD-10-CM

## 2019-11-11 DIAGNOSIS — Z636 Dependent relative needing care at home: Secondary | ICD-10-CM

## 2019-11-11 NOTE — Chronic Care Management (AMB) (Signed)
Care Management   Clinical Social Work Follow Up   11/11/2019 Name: MIO SCHELLINGER MRN: [id number] DOB: 1960-06-01 Referred by: [name], DO  Reason for referral : Care Coordination (facility placement)  YASUKO LAPAGE is a 59 y.o. year old female who is a primary care patient of [name], [name], DO.  Reason for follow-up: assess for barriers and progress with care plan .    Assessment: Patient's daught continues to experience difficulty with meeting patient's needs and would like to explore facility placement again.  Patient currently receives CAP services but this is not meeting patient and family's needs. Daughter also reports CAP nurse having a difficulty time with getting patient Rx for Ensure, Door alarm and shower bench.  States CAP has faxed information to provider with no response.  Daughter also reports patient having difficulty sleeping and wants to know if PCP and provide something to help patient sleep. Plan:  1. Patient will review facility list provided by LCSW 2.  LCSW will share concerns with PCP 3.   LCSW will F.U with daughter in 2 weeks if no return call  Advance Directive Status: N See Vynca application for related entries. ; SDOH (Social Determinants of Health)  No new needs identified   Goals Addressed            This Visit's Progress   . Care Giver support/ CAP program   On track    CARE PLAN ENTRY (see longitudinal plan of care for additional care plan information) Current Barriers & Progress:  . Patient with Dementia needs community resources for caregiver  . Acknowledges deficits and needs support, education and care coordination in order to meet this unmet need  . Daughter reports patient is currently receiving CAPS services and the service is going well . CAP worker unable to reach PCP requesting ( Rx for ensure, door alarm and shower chair) states has f Clinical Goal(s) Over the next 30 days, Provide patient's daughter with resources and  support to assist with meeting need Interventions provided by LCSW:  . daughter provided LCSW with update and contact number for CAP worker in [place]. [phone] . Assessed for concerns, barriers and how progressing . Alan Mulder [phone] with CAP to assess for barriers with getting DME for pateint, left voice message Patient Self Care Activities & Deficits:  . Patient is unable to independently navigate community resource options without care coordination support  . Acknowledges deficits and is motivated to resolve concern  Please see past updates related to this goal by clicking on the "Past Updates" button in the selected goal     . possible placement   Not on track    CARE PLAN ENTRY (see longitudinal plan of care for additional care plan information)  Current Barriers:   . Patient with Dementia has Care Coordination Needs related to facility placement for assisted living/Dementia care unit . Patient's health has declined and longer able to meet ADL's at home . Daughter and patient Acknowledges deficits with education and support in order to meet this need . Daughter has CAP program but reports no longer able to care for patient at home Clinical Social Work Goal(s):  Over the next 30 to 45 days patient will be placed in a facility  Over the next 10 to 20 days, patient and family will visit facilities   Interventions completed by LCSW: . Assessed needs and provided education on level of care and facility placement process . Obtained verbal permission to fax FL2  and supporting documentation to facilities identified by patient and daughter   . CSW will collaborate with identified facilities and assist patient as needed to understand facility selection process . Collaborate with primary care provider for completion of FL2 .  Provided daughter with a new list of facilities in local area via e-mail Patient Self Care Activities & Deficits:  . Patient/ Family Knowledge  deficits and is unable to perform appropriate self care  . Patient does not have consistent support to assist with appropriate and safe care at home.  . Patient 's daughter will select a facility from the list provided or be willing to take the first available facility Please see past updates related to this goal by clicking on the "Past Updates" button in the selected goal       Outpatient Encounter Medications as of 11/11/2019  Medication Sig  . Ferrous Sulfate (IRON) 325 (65 Fe) MG TABS Take by mouth.  . mirtazapine (REMERON) 15 MG tablet Take 1 tablet (15 mg total) by mouth at bedtime.  . risperiDONE (RISPERDAL) 1 MG tablet Take 1 tablet (1 mg total) by mouth at bedtime.   No facility-administered encounter medications on file as of 11/11/2019.   Review of patient status, including review of consultants reports, relevant laboratory and other test results, and collaboration with appropriate care team members and the patient's provider was performed as part of comprehensive patient evaluation and provision of care management services.    Sammuel Hines, LCSW Care Management & Coordination  [facility] / [facility]   [phone] 11:07 AM

## 2019-11-12 NOTE — Telephone Encounter (Signed)
11/19/19 at 2:30 PM for EEG.

## 2019-11-17 ENCOUNTER — Telehealth: Payer: Self-pay

## 2019-11-17 DIAGNOSIS — R634 Abnormal weight loss: Secondary | ICD-10-CM

## 2019-11-17 DIAGNOSIS — F0391 Unspecified dementia with behavioral disturbance: Secondary | ICD-10-CM

## 2019-11-17 NOTE — Telephone Encounter (Signed)
Received phone call from daughter regarding mother. Daughter expresses frustrations that she has been requesting shower chair, door alarm and ensure for greater than three weeks. Daughter is also requesting that provider prescribe stronger sleep medication. Patient is currently on Remeron 15 mg that is being managed by Neurologist (Dr. Karel Jarvis). Advised daughter to contact neurologist regarding sleep medication, as PCP may not want to alter a medication she is not prescribing. Daughter verbalizes understanding. Provided daughter with number to [facility].   Please advise if DME orders can be placed for the shower chair and door alarm, as well as order for Ensure.   Once order is placed, please send message back to "RN Team" for processing.   Veronda Prude, RN

## 2019-11-18 MED ORDER — ENSURE PLUS PO LIQD: 1.0000 | Freq: Two times a day (BID) | ORAL | 2 refills | Status: DC

## 2019-11-18 NOTE — Telephone Encounter (Signed)
DME orders placed. Paper DME orders in triage box.   Navi Ewton [name], DO 11/18/2019, 9:43 AM PGY-2, [facility]

## 2019-11-19 ENCOUNTER — Ambulatory Visit (INDEPENDENT_AMBULATORY_CARE_PROVIDER_SITE_OTHER): Payer: Medicare Other | Admitting: Family Medicine

## 2019-11-19 ENCOUNTER — Other Ambulatory Visit: Payer: Self-pay

## 2019-11-19 ENCOUNTER — Other Ambulatory Visit: Payer: Medicare Other

## 2019-11-19 DIAGNOSIS — Z5329 Procedure and treatment not carried out because of patient's decision for other reasons: Secondary | ICD-10-CM

## 2019-11-19 NOTE — Telephone Encounter (Signed)
Received below message from [employer].   received, thanks   Constantinos Krempasky C Aven Cegielski, RN   

## 2019-11-19 NOTE — Progress Notes (Signed)
Patient no-show to appointment 11/19/19. Due for COVID-19 immunization, flu shot, mammogram (previously ordered by another provider) colonoscopy, TDAP.  Shirlean Mylar, MD [facility], PGY-2

## 2019-11-19 NOTE — Telephone Encounter (Signed)
Community message sent to [employer]. Will await response.   Jaquelynn Wanamaker C Huzaifa Viney, RN  

## 2019-11-24 ENCOUNTER — Telehealth: Payer: Self-pay | Admitting: Family Medicine

## 2019-11-24 DIAGNOSIS — F0391 Unspecified dementia with behavioral disturbance: Secondary | ICD-10-CM

## 2019-11-24 NOTE — Telephone Encounter (Signed)
Had gentleman come in needs referral for patient for hospice.  Patient currently on home care and there referring her to hospice.  Per man needs referral for patient to hospice name of man is Virgina Jock [facility] hospice Liaison [phone].  He also stated he has access to [facility] if you want to do referral that way.  Patient was last visit was virtual was 11/11/2019.  Any question please call Timothy.

## 2019-11-26 NOTE — Telephone Encounter (Signed)
Spoke with Mr. Sherry Hicks from [facility] hospice and would like referral for hospice evaluation per daughter's request. Please see prior telephone encounter. Hospice referral placed during this encounter.   Arloa Prak [name], DO 11/26/2019, 10:49 AM PGY-2, [facility]

## 2019-12-05 ENCOUNTER — Ambulatory Visit: Payer: Self-pay | Admitting: Licensed Clinical Social Worker

## 2019-12-05 NOTE — Chronic Care Management (AMB) (Signed)
   Social Work  Care Management   12/05/2019 Name: Sherry Hicks MRN: [id number] DOB: 1961-01-09 Sherry Hicks is a 59 y.o. year old female who sees [name], [name], DO for primary care. LCSW was consulted for information /resources to assistance patient with  Level of Care Concerns.     Intervention: Patient was not interviewed or contacted during this encounter.  Patient currently has CAP services in place and being evaluated for Hospice services.  Plan:  1. No further follow up required by LCSW at this time 2. Will wait for outreach from daughter based on need once Hospice Assessment is complete  Review of patient status, including review of consultants reports, relevant laboratory and other test results, and collaboration with appropriate care team members and the patient's provider was performed as part of comprehensive patient evaluation and provision of chronic care management services.      Sammuel Hines, LCSW Care Management & Coordination  [facility] / [facility]   [phone] 12:05 PM

## 2019-12-23 ENCOUNTER — Other Ambulatory Visit: Payer: Self-pay | Admitting: Neurology

## 2020-01-06 ENCOUNTER — Ambulatory Visit: Payer: Medicare Other | Admitting: Licensed Clinical Social Worker

## 2020-01-06 DIAGNOSIS — Z7189 Other specified counseling: Secondary | ICD-10-CM

## 2020-01-06 NOTE — Patient Instructions (Signed)
  Ms. Short  it was nice speaking with you. Please call me directly [phone] if you have questions about the goals we discussed. Goals Addressed            This Visit's Progress   . supplies for incontinence       Patient Goals/Self-Care Activities: Over the next 30 days . Return phone calls from home health agency . Call office if no one has called you in 1 weeks       Ms. Alomar received Care Management services today:  1. Care Management services include personalized support from designated clinical staff supervised by her physician, including individualized plan of care and coordination with other care providers 2. 24/7 contact [phone] for assistance for urgent and routine care needs. 3. Care Management are voluntary services and be declined at any time by calling the office. Patient' daughter verbalizes understanding of instructions provided today.    Follow up plan: SW will follow up with patient by phone over the next 60 days  [name], LCSW

## 2020-01-06 NOTE — Chronic Care Management (AMB) (Signed)
Care Management  Follow Up Clinical Social Work General Note  01/06/2020 Name: Sherry Hicks MRN: [id number] DOB: 10-Aug-1960  Wiliam Hicks is enrolled in a Managed Medicaid plan: No. Outreach attempt today was successful.   Sherry Hicks is a 59 y.o. year old female who is a primary care patient of [name], [name], DO. F/U outreach  Assessment: Things are going will with support from the CAP's program  Concerns during this encounter/ how impacting: patient needs incontinence supplies Recommendation: Patient may benefit from, and is in agreement to allow LCSW to contact PCP and for needed request.   SDOH (Social Determinants of Health) screening performed today: None.  Advanced Directives Status: N See Vynca application for related entries.   Patient Care Plan: General Social Work (Adult)  Problem Identified: Functional Decline   Goal: support for incontinent needs   Start Date: 01/06/2020  Note:   Current Barriers:  Patient does not have needed supplies to meet incontinent needs  ,acknowledges deficits and need support in order to meet this unmet need Clinical Goals: Over the next 30 days, patient will have needed supplies  Interventions: . Assessed patient needs, what they have used in the past and how currently meeting needs . Collaborated with PCP regarding patient needs and request  . Inter-disciplinary care team collaboration with RN team . [facility] CAP care manager Judeth Cornfield [phone] left voice message Patient Goals/Self-Care Activities: Over the next 30 days . Return phone calls from home health agency . Call office if no one has called you in 1 weeks  Follow Up Plan:  LCSW will check on referral place to RN clinic for incontinent supplies  F/U with patient and daughter as needed in 30 to 60 days    Outpatient Encounter Medications as of 01/06/2020  Medication Sig  . Ensure Plus (ENSURE PLUS) LIQD Take 1 Can by mouth 2 (two) times daily between meals.  .  Ferrous Sulfate (IRON) 325 (65 Fe) MG TABS Take by mouth.  . mirtazapine (REMERON) 15 MG tablet TAKE 1 TABLET(15 MG) BY MOUTH AT BEDTIME  . risperiDONE (RISPERDAL) 1 MG tablet Take 1 tablet (1 mg total) by mouth at bedtime.   No facility-administered encounter medications on file as of 01/06/2020.    Ms. Lievanos was given information about Care Management services today including:  1. Care Management services include personalized support from designated clinical staff supervised by her physician, including individualized plan of care and coordination with other care providers 2. 24/7 contact phone numbers for assistance for urgent and routine care needs. 3. The patient may stop care management services at any time (effective at the end of the month) by phone call to the office staff.  Patient agreed to services and verbal consent obtained.   Sherry Pilon, LCSW

## 2020-01-13 ENCOUNTER — Other Ambulatory Visit: Payer: Self-pay | Admitting: Family Medicine

## 2020-01-13 DIAGNOSIS — F0391 Unspecified dementia with behavioral disturbance: Secondary | ICD-10-CM

## 2020-01-13 DIAGNOSIS — N39498 Other specified urinary incontinence: Secondary | ICD-10-CM

## 2020-01-13 MED ORDER — DISPOSABLE BRIEF MEDIUM MISC: 3 refills | Status: DC

## 2020-01-13 NOTE — Progress Notes (Signed)
Incontinent supplies ordered.   [name], DO 01/13/2020, 12:24 PM PGY-2, [facility]

## 2020-01-13 NOTE — Progress Notes (Signed)
    SUBJECTIVE:   CHIEF COMPLAINT / HPI:   [name] is a 59 yo F who presents with her cargivers (daughter and CNA) for the issues below.   Insomnia Daughter expresses that patient has not slept in 2 days and it is keep her from sleeping. She has tried Remeron and trazodone in the past. Daughter gave her 2 mg of promethazine and it did not help her sleep. She is opening to trying another medication. The patient will occasionally wander around the house at night but does not try to leave the house.   FL2 Form Would like to have another form filled out. Daughter would like to put mother in a SNF ([facility] in [place], [place]). Home health has helped some but CNAs continue to show up late or call out which placed an increased burden on the daughter who is also caring for her infant son.   PERTINENT  PMH / PSH: Earl onset dementia, bowel incontinence (needs DME order for briefs)  OBJECTIVE:   BP 130/80   Pulse 81   Ht 5\' 6"  (1.676 m)   Wt 116 lb 6.4 oz (52.8 kg)   SpO2 95%   BMI 18.79 kg/m   General: Appears well, no acute distress. Age appropriate. Cardiac: RRR, normal heart sounds, no murmurs Respiratory: CTAB, normal effort Extremities: No edema or cyanosis. Psych: normal affect  ASSESSMENT/PLAN:   Insomnia due to other mental disorder Previously taking trazadone 50 mg. Will attempted an increased dose.  - traZODone (DESYREL) 100 MG tablet; Take 1 tablet (100 mg total) by mouth at bedtime.  Dispense: 30 tablet; Refill: 3  Dementia with behavioral disturbance, unspecified dementia type [facility])  Caregiver stress Daughter has been caring for mother at home along with infant child. Home aid services changed x1 but continued to have staffing issue. Would like mother to go to SNF. FL2 form discussed with daughter and [facility]. Daughter would like placement prior to new year and states [facility] has bed open now. Fax and email of facility obtain [phone];  [email or web address] -FL2 form to be completed  Psychogenic fecal incontinence - DME order for brief placed.     ([phone], DO [facility]

## 2020-01-14 ENCOUNTER — Ambulatory Visit (INDEPENDENT_AMBULATORY_CARE_PROVIDER_SITE_OTHER): Payer: Medicare Other | Admitting: Family Medicine

## 2020-01-14 ENCOUNTER — Other Ambulatory Visit: Payer: Self-pay

## 2020-01-14 ENCOUNTER — Encounter: Payer: Self-pay | Admitting: Family Medicine

## 2020-01-14 ENCOUNTER — Ambulatory Visit: Payer: Medicare Other | Admitting: Licensed Clinical Social Worker

## 2020-01-14 VITALS — BP 130/80 | HR 81 | Ht 66.0 in | Wt 116.4 lb

## 2020-01-14 DIAGNOSIS — Z636 Dependent relative needing care at home: Secondary | ICD-10-CM

## 2020-01-14 DIAGNOSIS — F0391 Unspecified dementia with behavioral disturbance: Secondary | ICD-10-CM

## 2020-01-14 DIAGNOSIS — Z7189 Other specified counseling: Secondary | ICD-10-CM

## 2020-01-14 DIAGNOSIS — F99 Mental disorder, not otherwise specified: Secondary | ICD-10-CM

## 2020-01-14 DIAGNOSIS — F5105 Insomnia due to other mental disorder: Secondary | ICD-10-CM

## 2020-01-14 DIAGNOSIS — F981 Encopresis not due to a substance or known physiological condition: Secondary | ICD-10-CM

## 2020-01-14 MED ORDER — TRAZODONE HCL 100 MG PO TABS: 100.0000 mg | ORAL_TABLET | Freq: Every day | ORAL | 3 refills | Status: DC

## 2020-01-14 NOTE — Chronic Care Management (AMB) (Signed)
Care Management   Clinical Social Work Follow Up   01/14/2020 Name: Sherry Hicks MRN: [id number] DOB: [birth year] Referred by: [name], DO  Reason for referral : Care Coordination (facility placement)  Sherry Hicks is a 59 y.o. year old female who is a primary care patient of [name], [name], DO.  [name]  Concerns during this encounter: Daughter would like assistance with placement  Advance Directive Status: N See  Vynca application for related entries.  SDOH (Social Determinants of Health) assessments performed:No new needs identified   Patient Care Plan: General Social Work (Adult)  Problem Identified: Functional Decline   Goal: support for incontinent needs   Start Date: 01/06/2020  Note:   Current Barriers:  Patient does not have needed supplies to meet incontinent needs  ,acknowledges deficits and need support in order to meet this unmet need Clinical Goals: Over the next 30 days, patient will have needed supplies  Interventions: . Assessed patient needs, what they have used in the past and how currently meeting needs . Collaborated with PCP regarding patient needs and request  . Inter-disciplinary care team collaboration with RN team . [facility] CAP care manager Judeth Cornfield [phone] left voice message Patient Goals/Self-Care Activities: Over the next 30 days . Return phone calls from home health agency . Call office if no one has called you in 1 weeks  Follow Up Plan:  LCSW will check on referral place to RN clinic for incontinent supplies  F/U with patient and daughter as needed in 30 to 60 days     Problem Identified: Functional Decline   Goal: facility placement   Start Date: 01/14/2020  Current Barriers:  Patient's health has declined and longer able to meet ADL's at home. Daughter is requesting assistance with placement. Clinical Goal(s): Over the next 30 to 45 days patient will be placed in a facility, patient and family will work with LCSW, to  coordinate needs for long term skilled nursing facility  Placement Interventions: . Assessed needs and facility placement process;  [name] Family has requested FL2 be faxed to [facility] in [place] Fax # provided [phone] . Obtained patient's verbal permission to fax FL2 and supporting document to facilities as well as obtain PASSAR  . initiated process to obtain PASSAR . Collaborated with primary care provider for completion of FL2 . Collaborate with identified facilities and assist patient as needed to understand facility selection process . Assist patient with faxing FL2 to identified facilities once completed and signed by PCP Patient Goals/Self-Care Activities: Over the next 30 days . Select a facilities from the list provided  . Call to check on availability and visit facilities . Call if there are other facilities to fax the FL2 Follow Up Plan:  . Will f/u with daughter as needed or she will call LCSW       Outpatient Encounter Medications as of 01/14/2020  Medication Sig  . Ensure Plus (ENSURE PLUS) LIQD Take 1 Can by mouth 2 (two) times daily between meals.  . Ferrous Sulfate (IRON) 325 (65 Fe) MG TABS Take by mouth.  . traZODone (DESYREL) 100 MG tablet Take 1 tablet (100 mg total) by mouth at bedtime.   No facility-administered encounter medications on file as of 01/14/2020.   Review of patient status, including review of consultants reports, relevant laboratory and other test results, and collaboration with appropriate care team members and the patient's provider was performed as part of comprehensive patient evaluation and provision of care management  services.   Sammuel Hines, LCSW Care Management & Coordination  [facility] / [facility]   [phone] 4:20 PM

## 2020-01-14 NOTE — [place] FL2 (Signed)
  [place] MEDICAID FL2 LEVEL OF CARE SCREENING TOOL     IDENTIFICATION  Patient Name: Sherry Hicks Birthdate: Jan 26, 1961 Sex: female Admission Date (Current Location): (Not on file)  [name] Number:  [name] [id number] [facility] Facility and Address:         Provider Number: [id number]  Attending Physician Name and Address:  Dr. [name] [name]  [address] [place]   Relative Name and Phone Number:  Myesha Stillion  [phone]    Current Level of Care: Hospital Recommended Level of Care: Skilled Nursing Facility Prior Approval Number:    Date Approved/Denied:   PASRR Number:    Discharge Plan:      Current Diagnoses: Patient Active Problem List   Diagnosis Date Noted  . History of mood disorder 08/29/2019  . Apraxia 08/29/2019  . Alteration in performance of activities of daily living 08/29/2019  . Caregiver stress 08/29/2019  . Early Onset Dementia with behavioral problem (HCC)   . Hyperlipidemia 07/30/2019  . Weight loss, unintentional 07/06/2019  . Hypernatremia 07/06/2019  . History of asthma, mild, intermittent 2017  . Major depressive disorder, recurrent episode (HCC) 04/12/2006  . HYPERTENSION, BENIGN SYSTEMIC 04/12/2006    Orientation RESPIRATION BLADDER Height & Weight     Self  Normal Incontinent Weight:  116lb Height:   5'6"  BEHAVIORAL SYMPTOMS/MOOD NEUROLOGICAL BOWEL NUTRITION STATUS      Continent Supplemental (ensure)  AMBULATORY STATUS COMMUNICATION OF NEEDS Skin   Independent Verbally Normal                       Personal Care Assistance Level of Assistance  Bathing, Feeding, Dressing Bathing Assistance: Limited assistance Feeding assistance: Limited assistance Dressing Assistance: Limited assistance     Functional Limitations Info             SPECIAL CARE FACTORS FREQUENCY  Blood pressure Blood Pressure Frequency: weekly                  Contractures Contractures Info: Not present     Additional Factors Info  Allergies   Allergies Info: Hydrocodone-acetaminophen and Donepezil           Current Medications (01/14/2020):   Current Outpatient Medications  Medication Sig Dispense Refill  . Ensure Plus (ENSURE PLUS) LIQD Take 1 Can by mouth 2 (two) times daily between meals. 14220 mL 2  . Ferrous Sulfate (IRON) 325 (65 Fe) MG TABS Take by mouth.    . traZODone (DESYREL) 100 MG tablet Take 1 tablet (100 mg total) by mouth at bedtime. 30 tablet 3   No current facility-administered medications for this visit.     Discharge Medications:   Relevant Imaging Results:  Relevant Lab Results:   Additional Information SS# [id number]  [name], LCSW

## 2020-01-14 NOTE — Patient Instructions (Signed)
  Ms. Snodgrass  it was nice speaking with you. Please call me directly [phone] if you have questions about the goals we discussed. Goals Addressed            This Visit's Progress   . facility placement       atient Goals/Self-Care Activities: Over the next 30 days . Select a facilities from the list provided  . Call to check on availability and visit facilities . Call if there are other facilities to fax the [facility]       Ms. Handel received Care Management services today:  1. Care Management services include personalized support from designated clinical staff supervised by her physician, including individualized plan of care and coordination with other care providers 2. 24/7 contact [phone] for assistance for urgent and routine care needs. 3. Care Management are voluntary services and be declined at any time by calling the office. Patient's daughter verbalizes understanding of instructions provided today.    Follow up plan: SW will follow up with patient by phone over the next as needed  [name], LCSW

## 2020-01-14 NOTE — Patient Instructions (Signed)
It was wonderful to see you today.  Today we talked about:  Starting trazadone 100 mg daily for sleep.   Getting the FL2 form filled out.   Please call the clinic at [phone] if your symptoms worsen or you have any concerns. It was our pleasure to serve you.  Dr. Salvadore Dom

## 2020-01-15 ENCOUNTER — Ambulatory Visit: Payer: Medicare Other | Admitting: Licensed Clinical Social Worker

## 2020-01-15 NOTE — Patient Instructions (Signed)
  Sherry Hicks  it was nice speaking with your daughter. Please call me directly [phone] if you have questions about the goals we discussed. Goals Addressed            This Visit's Progress   . COMPLETED: Care Giver support/ CAP program       Current Barriers & Progress:  . Patient with Dementia needs community resources for caregiver  . Acknowledges deficits and needs support, education and care coordination in order to meet this unmet need  . Daughter reports patient is currently receiving CAPS services and the service is going well . CAP worker unable to reach PCP requesting ( Rx for ensure, door alarm and shower chair) states has f Clinical Goal(s) Over the next 30 days, Provide patient's daughter with resources and support to assist with meeting need Interventions provided by LCSW:  . daughter provided LCSW with update and contact number for CAP worker in [place]. [phone] . Assessed for concerns, barriers and how progressing . Alan Mulder [phone] with CAP to assess for barriers with getting DME for pateint, left voice message Patient Self Care Activities & Deficits:  . Patient is unable to independently navigate community resource options without care coordination support  . Acknowledges deficits and is motivated to resolve concern      . facility placement   On track    Patient Goals/Self-Care Activities: Over the next 30 days . Call to check on availability and visit facilities . Call me if there are other facilities to fax the FL2 . Call [facility] to see if they are able to take your mom       Sherry Hicks received Care Management services today:  1. Care Management services include personalized support from designated clinical staff supervised by her physician, including individualized plan of care and coordination with other care providers 2. 24/7 contact [phone] for assistance for urgent and routine care  needs. 3. Care Management are voluntary services and be declined at any time by calling the office. Patient verbalizes understanding of instructions provided today.    Follow up plan: SW will follow up with patient by phone over the next as needed  [name], LCSW

## 2020-01-15 NOTE — Chronic Care Management (AMB) (Signed)
Care Management   Follow Up Clinical Social Work General Note  01/15/2020 Name: Sherry Hicks MRN: [id number] DOB: July 22, 1960  Sherry Hicks is enrolled in a Managed Medicaid plan: No. Outreach attempt today was successful.   Sherry Hicks is a 59 y.o. year old female who is a primary care patient of [name], [name], DO. The Care Management team was consulted to assist the patient with Level of Care Concerns for facility placement.   Concerns during this encounter: FL2 faxed to requested facility and PASSAR started Plan: Patient 's daughter will contact facility and LCSW once bed offer is made.   SDOH (Social Determinants of Health) screening performed today:  No.  Advanced Directives Status: N See  Vynca application for related entries.  Patient Care Plan: General Social Work (Adult)  Problem Identified: Functional Decline   Goal: support for incontinent needs   Start Date: 01/06/2020  This Visit's Progress: On track  Current Barriers:  Patient does not have needed supplies to meet incontinent needs  ,acknowledges deficits and need support in order to meet this unmet need Clinical Goals: Over the next 30 days, patient will have needed supplies  Interventions: . Assessed patient needs, what they have used in the past and how currently meeting needs . Collaborated with PCP regarding patient needs and request for supplies Patient Goals/Self-Care Activities: Over the next 30 days . Return phone calls from home health agency . Call office if no one has called you in 1 weeks     Problem Identified: Functional Decline   Goal: facility placement   Start Date: 01/14/2020  This Visit's Progress: On track  Current Barriers:  Patient's health has declined and longer able to meet ADL's at home. Daughter is requesting assistance with placement. Clinical Goal(s): Over the next 30 to 45 days patient will be placed in a facility, patient and family will work with LCSW, to coordinate  needs for long term skilled nursing facility  Placement Interventions: . Assessed needs and facility placement process;  . FL2 faxed to [facility] in [place] Fax # provided [phone] . initiated process to obtain PASSAR # ; process is pending needs additional information from office visit yesterday . Will f/U with PCP and send requested information  Patient Goals/Self-Care Activities: Over the next 30 days . Call to check on availability and visit facilities . Call me if there are other facilities to fax the FL2 . Call [facility] to see if they are able to take your mom Follow Up Plan:  . Will f/u with daughter as needed or she will call LCSW    Outpatient Encounter Medications as of 01/15/2020  Medication Sig  . Ensure Plus (ENSURE PLUS) LIQD Take 1 Can by mouth 2 (two) times daily between meals.  . Ferrous Sulfate (IRON) 325 (65 Fe) MG TABS Take by mouth.  . traZODone (DESYREL) 100 MG tablet Take 1 tablet (100 mg total) by mouth at bedtime.   No facility-administered encounter medications on file as of 01/15/2020.   Ms. Pollman was given information about Care Management services today including:  1. Care Management services include personalized support from designated clinical staff supervised by her physician, including individualized plan of care and coordination with other care providers 2. 24/7 contact phone numbers for assistance for urgent and routine care needs. 3. The patient may stop care management services at any time (effective at the end of the month) by phone call to the  office staff. Patient's daughter  verbally agreed to assistance and services provided by embedded care coordination/care management team today.  [name], LCSW

## 2020-01-16 ENCOUNTER — Ambulatory Visit: Payer: Self-pay | Admitting: Licensed Clinical Social Worker

## 2020-01-16 NOTE — Chronic Care Management (AMB) (Signed)
   Social Work  Care Management Collaboration 01/16/2020 Name: Sherry Hicks MRN: [id number] DOB: 1961-02-13 Sherry Hicks is a 59 y.o. year old female who sees [name], [name], DO for primary care. LCSW was consulted to assistance patient with  Level of Care Concerns for placement. Received message from [facility] MUST adjustments needed on FL2  Intervention: Patient was not interviewed or contacted during this encounter.  LCSW collaborated with PCP and [facility] for corrected FL2.  Review of patient status, including review of consultants reports, relevant laboratory and other test results, and collaboration with appropriate care team members and the patient's provider was performed as part of comprehensive patient evaluation and provision of chronic care management services.    Plan: LCSW will continue to assist with process as needed.  Sammuel Hines, LCSW Care Management & Coordination  [facility] / [facility]   [phone] 10:16 AM

## 2020-01-16 NOTE — Chronic Care Management (AMB) (Addendum)
   Social Work  Care Management Collaboration 01/16/2020 Name: [name] MRN: [id number] DOB: 11/27/1960 Sherry Hicks is a 59 y.o. year old female who sees [name], [name], DO for primary care. LCSW was consulted to assistance patient with  Level of Care Concerns for facility placement. Received call from [facility] with [employer] in [place].  Requesting to resend FL2.   ATTN to [employer] number: [phone] Phone number: [phone]  Intervention: Patient was not interviewed or contacted during this encounter.  LCSW collaborated with facility that family requested to send FL2.  Information refaxed.  Call Sherry Hicks to confirm she received it.  Review of patient status, including review of consultants reports, relevant laboratory and other test results, and collaboration with appropriate care team members and the patient's provider was performed as part of comprehensive patient evaluation and provision of chronic care management services.    Plan: Rodell Perna will review FL2 and contact family to inform if they are able to make a bed offer.  LCSW will continue to follow and support as needed.  Sammuel Hines, LCSW Care Management & Coordination  [facility] / [facility]   [phone] 3:36 PM

## 2020-01-16 NOTE — NC FL2 (Signed)
  [place] MEDICAID FL2 LEVEL OF CARE SCREENING TOOL     IDENTIFICATION  Patient Name: Sherry Hicks Birthdate: February 24, 1960 Sex: female Admission Date (Current Location): (Not on file)  Sherry Hicks Number:  Sherry Hicks [id number] Sherry Hicks Facility and Address:         Provider Number: [id number]  Attending Physician Name and Address:  Dr. [name] [name]  [address] [place]  Relative Name and Phone Number:  Sherry Hicks  [phone]    Current Level of Care: Home Recommended Level of Care: Skilled Nursing Facility Prior Approval Number:    Date Approved/Denied:   PASRR Number:    Discharge Plan:      Current Diagnoses: Patient Active Problem List   Diagnosis Date Noted  . History of mood disorder 08/29/2019  . Apraxia 08/29/2019  . Alteration in performance of activities of daily living 08/29/2019  . Caregiver stress 08/29/2019  . Early Onset Dementia with behavioral problem (HCC)   . Hyperlipidemia 07/30/2019  . Weight loss, unintentional 07/06/2019  . Hypernatremia 07/06/2019  . History of asthma, mild, intermittent 2017  . Major depressive disorder, recurrent episode (HCC) 04/12/2006  . HYPERTENSION, BENIGN SYSTEMIC 04/12/2006    Orientation RESPIRATION BLADDER Height & Weight     Self  Normal Incontinent Weight:   116lb Height:    5'6"  BEHAVIORAL SYMPTOMS/MOOD NEUROLOGICAL BOWEL NUTRITION STATUS      Continent Supplemental (ensure)  AMBULATORY STATUS COMMUNICATION OF NEEDS Skin   Independent Verbally Normal                       Personal Care Assistance Level of Assistance  Bathing, Feeding, Dressing Bathing Assistance: Limited assistance Feeding assistance: Limited assistance Dressing Assistance: Limited assistance     Functional Limitations Info             SPECIAL CARE FACTORS FREQUENCY  Blood pressure Blood Pressure Frequency: weekly                  Contractures Contractures Info: Not present     Additional Factors Info  Allergies   Allergies Info: Allergies Info: Hydrocodone-acetaminophen and Donepezil           Current Medications (01/16/2020):   Current Outpatient Medications  Medication Sig Dispense Refill  . Ensure Plus (ENSURE PLUS) LIQD Take 1 Can by mouth 2 (two) times daily between meals. 14220 mL 2  . Ferrous Sulfate (IRON) 325 (65 Fe) MG TABS Take by mouth.    . traZODone (DESYREL) 100 MG tablet Take 1 tablet (100 mg total) by mouth at bedtime. 30 tablet 3   No current facility-administered medications for this visit.     Discharge Medications:   Relevant Imaging Results:  Relevant Lab Results:   Additional Information SS# [id number]  Sherry Pilon, LCSW

## 2020-01-20 ENCOUNTER — Ambulatory Visit: Payer: Medicare Other | Admitting: Licensed Clinical Social Worker

## 2020-01-20 DIAGNOSIS — Z7189 Other specified counseling: Secondary | ICD-10-CM

## 2020-01-20 NOTE — Chronic Care Management (AMB) (Signed)
Care Management  Follow Up Clinical Social Work General Note  01/20/2020 Name: Sherry Hicks MRN: [id number] DOB: 1960-03-29  Sherry Hicks is enrolled in a Managed Medicaid plan: No. Outreach attempt today was successful.   Sherry Hicks is a 59 y.o. year old female who is a primary care patient of [name], [name], DO. The Care Management team was consulted to assist the patient with Level of Care Concerns for facility placement.  Patient continues to make progress with this goal. See care plan below for details. Follow up Plan:  will continue to assist with ongoing coordination as needed until patient is placed.  SDOH (Social Determinants of Health) screening performed today:  No.  Advanced Directives Status:See Vynca application for related entries.       Patient Care Plan: General Social Work (Adult)  Problem Identified: Functional Decline   Goal: support for incontinent needs   Start Date: 01/06/2020  This Visit's Progress: On track  Note:   Current Barriers:  Patient does not have needed supplies to meet incontinent needs  ,acknowledges deficits and need support in order to meet this unmet need Clinical Goals: Over the next 30 days, patient will have needed supplies  Interventions: . Assessed patient needs, what they have used in the past and how currently meeting needs . Collaborated with PCP regarding patient needs and request for supplies Patient Goals/Self-Care Activities: Over the next 30 days . Return phone calls from home health agency . Call office if no one has called you in 1 weeks   Problem Identified: Functional Decline   Goal: facility placement   Start Date: 01/14/2020  Expected End Date: 02/20/2020  This Visit's Progress: On track  Recent Progress: On track  Current Barriers:  Patient's health has declined and longer able to meet ADL's at home. Daughter is requesting assistance with placement. Clinical Goal(s): Over the next 30 to 45 days patient will  be placed in a facility, patient and family will work with LCSW, to coordinate needs for long term skilled nursing facility  Placement Interventions: . Assessed progress and barriers with facility placement  . Spoke with patient's daughter and [facility] in [place] for needs, progress and barriers . Spoke with Beverlyn Roux [phone] to provide additional information need to obtain PASSAR # ; and to arrange phone or video visit with daughter & patient  Patient / Family Self-Care Activities: Over the next 30 days . Call to check on availability and visit facilities . Call me if there are other facilities to fax the FL2 . Continue to f/u [facility] to see if they are able to take your mom Follow Up Plan: will continue to assist with ongoing coordination as needed     Outpatient Encounter Medications as of 01/20/2020  Medication Sig  . Ensure Plus (ENSURE PLUS) LIQD Take 1 Can by mouth 2 (two) times daily between meals.  . Ferrous Sulfate (IRON) 325 (65 Fe) MG TABS Take by mouth.  . traZODone (DESYREL) 100 MG tablet Take 1 tablet (100 mg total) by mouth at bedtime.   No facility-administered encounter medications on file as of 01/20/2020.   Ms. Galiano was given information about Care Management services today including:  1. Care Management services include personalized support from designated clinical staff supervised by her physician, including individualized plan of care and coordination with other care providers 2. 24/7 contact phone numbers for assistance for urgent and routine care needs. 3. The patient  may stop care management services at any time (effective at the end of the month) by phone call to the office staff.  Patient's daughter verbally agreed to assistance and services provided by embedded care coordination/care management team today.  Sherry Pilon, LCSW

## 2020-01-20 NOTE — Patient Instructions (Signed)
  Ms. Naff  it was nice speaking with your daughter. Please call me directly [phone] if you have questions about the goals we discussed. Goals Addressed            This Visit's Progress   . facility placement   On track    Patient / Family Self-Care Activities: Over the next 30 days . Call to check on availability and visit facilities . Call me if there are other facilities to fax the FL2 . Continue to f/u [facility] to see if they are able to take your mom      Ms. Vassell received Care Management services today:  1. Care Management services include personalized support from designated clinical staff supervised by her physician, including individualized plan of care and coordination with other care providers 2. 24/7 contact [phone] for assistance for urgent and routine care needs. 3. Care Management are voluntary services and be declined at any time by calling the office.  Patient verbalizes understanding of instructions provided today.    Follow up plan: LCSW will continue to assist with ongoing coordination as needed   [name], LCSW

## 2020-01-22 ENCOUNTER — Ambulatory Visit: Payer: Medicare Other | Admitting: Licensed Clinical Social Worker

## 2020-01-22 DIAGNOSIS — Z7189 Other specified counseling: Secondary | ICD-10-CM

## 2020-01-22 NOTE — Chronic Care Management (AMB) (Signed)
Care Management  Follow Up Clinical Social Work General Note  01/22/2020 Name: Sherry Hicks MRN: [id number] DOB: 1960/10/24  Sherry Hicks is enrolled in a Managed Medicaid plan: No. Outreach attempt today was successful.   Sherry Hicks is a 59 y.o. year old female who is a primary care patient of Sherry Hicks. Sherry Care Management team was consulted to assist Sherry patient with Level of Care Concerns for facility placement, not making progress. See care plan below for details.   Patient Care Plan: General Social Work (Adult)  Problem Identified: Functional Decline   Goal: support for incontinent needs   Start Date: 01/06/2020  This Visit's Progress: On track  Current Barriers:  Patient does not have needed supplies to meet incontinent needs  ,acknowledges deficits and need support in order to meet this unmet need Clinical Goals: Over Sherry next 30 days, patient will have needed supplies  Interventions:  Assessed patient needs, what they have used in Sherry past and how currently meeting needs  Collaborated with PCP regarding patient needs and request for supplies Patient Goals/Self-Care Activities: Over Sherry next 30 days  Return phone calls from home health agency  Call office if no one has called you in 1 weeks   Problem Identified: Functional Decline   Goal: facility placement   Start Date: 01/14/2020  Expected End Date: 02/20/2020  This Visit's Progress: Not on track  Recent Progress: On track  Note:   Current Barriers:  Sherry Hicks has declined to take patient  Patient's health has declined and longer able to meet ADL's at home. Sherry Hicks is requesting assistance with placement. Clinical Goal(s): Over Sherry next 30 to 45 days patient will be placed in a facility, patient and family will work with LCSW, to coordinate needs for long term skilled nursing facility  Placement Interventions:  Sherry with Sherry Hicks to assessed  progress and barriers with facility placement   Patient needs new FL2 to better reflect needed level of care; new FL2 sent for ALF/ [facility]  Called several facilities to check for availability and faxed FL2 to Sherry following for review  o Sherry Hicks o Sherry Hicks o Sherry Hicks o Sher[facility] (return call No beds)  Other calls placed, left voice messages  Sherry Hicks [address] [place] [address] [place], [place] [address] Phone: [phone] Fax: [phone]   left voice message for Toniann Fail  [phone]    Sherry Hicks [address] [place], [place] [address] Phone: [phone] Fax: [phone]  left voice message Sherry Hicks [address] [place], [place] [address] Phone: [phone] Fax: [phone]  left voice message Sherry Hicks [address] [place], [place] [address] Phone: [phone] Fax: [phone]- no answer  Sherry Hicks [address] [place], [place] [address] Phone: [phone] Fax: [phone]  faxed Sherry Hicks [address] [place], [place] [address] Phone: [phone] Fax: [phone]  left voice message Sherry Hicks [address] [place], [place] [address] Phone: [phone] Fax: [phone]   left voice message for Cecille Aver: [employer] [address] [place], [place] [address] Phone: [phone] Fax: [phone] Sherry Hicks [address] [place], [place] [address] Phone: [phone] Fax: [phone]  left message for Sherry Hicks [address]  [place], [place] [address] Phone: [phone] Fax: [phone] Sherry Hicks Fax  to attention of Sherry Hicks [address] [place], [place] [address] Phone: [phone] Fax: [phone]   Sherry Hicks [address] [place], [place] [address] Phone: [phone] Fax: [phone]  Left voice message Sherry Hicks [address] [place], [place] [address] Phone: [phone] Fax: [phone]  fax attention  Sherry Hicks   Patient / Family Self-Care Activities: Over Sherry next 30 days  Call to check on availability and visit facilities  Call me if there are other facilities to fax Sherry FL2 Follow Up Plan: will continue to assist with ongoing coordination  for placement     Outpatient Encounter Medications as of 01/22/2020  Medication Sig   Ensure Plus (ENSURE PLUS) LIQD Take 1 Can by mouth 2 (two) times daily between meals.   Ferrous Sulfate (IRON) 325 (65 Fe) MG TABS Take by mouth.   traZODone (DESYREL) 100 MG tablet Take 1 tablet (100 mg total) by mouth at bedtime.   No facility-administered encounter medications on file as of 01/22/2020.   Ms. Ditullio was given information about Care Management services today including:  1. Care Management services include personalized support from designated clinical staff supervised by her physician, including individualized plan of care and coordination with other care providers 2. 24/7 contact phone numbers for assistance for urgent and routine care needs. 3. Sherry patient may stop care management services at any time (effective at Sherry end of Sherry month) by phone call to Sherry office staff.  Patient's Sherry Hicks verbally agreed to assistance and services provided by embedded care coordination/care management team today.  [name], LCSW

## 2020-01-22 NOTE — Patient Instructions (Signed)
  Sherry Hicks  it was nice speaking with you. Please call me directly [phone] if you have questions about the goals we discussed. Goals Addressed            This Visit's Progress   . facility placement   Not on track    Patient / Family Self-Care Activities: Over the next 30 days . Call to check on availability and visit facilities . Call me if there are other facilities to fax the [facility]      Sherry Hicks received Care Management services today:  1. Care Management services include personalized support from designated clinical staff supervised by her physician, including individualized plan of care and coordination with other care providers 2. 24/7 contact [phone] for assistance for urgent and routine care needs. 3. Care Management are voluntary services and be declined at any time by calling the office.  Patient's daughter verbalizes understanding of instructions provided today.    Follow up plan: will continue to assist with placment  [name], LCSW

## 2020-01-22 NOTE — [place] FL2 (Signed)
  [place] MEDICAID FL2 LEVEL OF CARE SCREENING TOOL     IDENTIFICATION  Patient Name: Sherry Hicks Birthdate: 1960-08-23 Sex: female Admission Date (Current Location): (Not on file)  [name] Number:  Sherry Hicks [id number] [facility] Facility and Address:         Provider Number: [id number]  Attending Physician Name and Address:  Dr. [name] [name] and Dr. Doralee Albino [address] [place]  Relative Name and Phone Number:  Sherry Hicks  [phone]    Current Level of Care: Home Recommended Level of Care: Assisted Living [facility] Care Prior Approval Number:    Date Approved/Denied:   PASRR Number:    Discharge Plan:      Current Diagnoses: Patient Active Problem List   Diagnosis Date Noted  . History of mood disorder 08/29/2019  . Apraxia 08/29/2019  . Alteration in performance of activities of daily living 08/29/2019  . Caregiver stress 08/29/2019  . Early Onset Dementia with behavioral problem (HCC)   . Hyperlipidemia 07/30/2019  . Weight loss, unintentional 07/06/2019  . Hypernatremia 07/06/2019  . History of asthma, mild, intermittent 2017  . Major depressive disorder, recurrent episode (HCC) 04/12/2006  . HYPERTENSION, BENIGN SYSTEMIC 04/12/2006    Orientation RESPIRATION BLADDER Height & Weight     Self  Normal Incontinent Weight:  116lb Height:   5'6"  BEHAVIORAL SYMPTOMS/MOOD NEUROLOGICAL BOWEL NUTRITION STATUS      Continent Supplemental (ensure)  AMBULATORY STATUS COMMUNICATION OF NEEDS Skin   Independent Verbally Normal                       Personal Care Assistance Level of Assistance  Bathing,Feeding,Dressing Bathing Assistance: Limited assistance Feeding assistance: Limited assistance Dressing Assistance: Limited assistance     Functional Limitations Info             SPECIAL CARE FACTORS FREQUENCY  Blood pressure Blood Pressure Frequency: weekly                  Contractures  Contractures Info: Not present    Additional Factors Info  Allergies   Allergies Info: Hydrocodone-acetaminophen and Donepezil           Current Medications (01/22/2020):   Current Outpatient Medications  Medication Sig Dispense Refill  . Ensure Plus (ENSURE PLUS) LIQD Take 1 Can by mouth 2 (two) times daily between meals. 14220 mL 2  . Ferrous Sulfate (IRON) 325 (65 Fe) MG TABS Take by mouth.    . traZODone (DESYREL) 100 MG tablet Take 1 tablet (100 mg total) by mouth at bedtime. 30 tablet 3   No current facility-administered medications for this visit.   Discharge Medications:  Relevant Imaging Results:  Relevant Lab Results:  Additional Information SS# [id number]  Sherry Pilon, LCSW

## 2020-01-23 ENCOUNTER — Ambulatory Visit: Payer: Medicare Other | Admitting: Licensed Clinical Social Worker

## 2020-01-23 NOTE — Chronic Care Management (AMB) (Signed)
**Note DeHicksIdentified via Obfuscation** Hicks Management  Follow Up Clinical Social Work General Note  01/23/2020 Name: Sherry Hicks MRN: [id number] DOB: May 05, 1960  Sherry Hicks is enrolled in a Managed Medicaid plan: No. Outreach attempt today was successful.   Sherry Hicks is a 59 y.o. year old female who is a primary Hicks patient of AutryHicksLott, [name], DO. The Hicks Management team was consulted to assist the patient with Level of Hicks Concerns. See Hicks plan below for details. Updates after careplan completed  Call made to  Sherry Hicks [facility] - left   Voice message for Sherry Hicks  Patient's daughter also reports she has spoken to Sherry Hicks who will call her back on Monday to do a home assessment  Sherry Hicks at patient's Hicks when LCSW called this afternoon  Patient Hicks Plan: General Social Work (Adult)  Problem Identified: Functional Decline   Goal: support for incontinent needs   Start Date: 01/06/2020  This Visit's Progress: On track  Current Barriers:  Patient does not have needed supplies to meet incontinent needs  ,acknowledges deficits and need support in order to meet this unmet need Clinical Goals: Over the next 30 days, patient will have needed supplies  Interventions: . Assessed patient needs, what they have used in the past and how currently meeting needs . Collaborated with PCP regarding patient needs and request for supplies Patient Goals/SelfHicksHicks Activities: Over the next 30 days . Return phone calls from home health agency . Call office if no one has called you in 1 weeks   Problem Identified: Functional Decline   Goal: facility placement   Start Date: 01/14/2020  Expected End Date: 02/20/2020  This Visit's Progress: Not on track  Recent Progress: Not on track  Priority: High  Current Barriers:  Sherry Hicks in Sherry Hicks has declined to take patient . Patient's health has declined and longer able to meet ADL's at home. Daughter is  requesting assistance with placement. Clinical Goal(s): Over the next 30 to 45 days patient will be placed in a facility, patient and family will work with LCSW, to coordinate needs for long term skilled nursing facility  Placement Interventions: . Spoke with daughter to assessed progress and barriers with facility placement  . Patient needs new FL2 to better reflect needed level of Hicks; new FL2 sent for ALF/ MCU . Called several facilities to check for availability and faxed FL2 today to the following for review  o Sherry Hicks o Sherry Hicks o Sherry Hicks in [place] o Sherry Hicks o Sherry [facility] unable to make a bed offer o 2nd call to [employer] [facility] Other calls placed, left voice messages  . f/u call to 12/10/201 left [facility] [address] [place], [place] [address] Phone: [phone] Fax: [phone]  faxed [facility] 01/22/20 . Refaxed 01/23/20 to [facility] [address] [place]. [place], [place] [address] Phone: [phone] Fax: [phone]  fax attention  Hollie Salk - requires patient to be vaccinated and must apply for SA.  Information shared with daughter.   Arneta Cliche to [facility]  01/23/2020  ([phone])  spoke to [place] [phone] ext [phone]   . Faxed to [facility] [place] [facility])  they have one female bed   faxed to [employer] [phone]  Fax [phone] . Faxed 01/23/2020 to Sherry AttMeriam Sprague [phone]    Fax # [phone]  Patient / Family SelfHicksHicks Activities:  Over the next 30 days . Call to check on availability and visit facilities . Call me if there are other facilities to fax the FL2 Follow Up Plan: will continue to assist with ongoing coordination  for placement      Outpatient Encounter Medications as of 01/23/2020  Medication Sig  . Ensure Plus (ENSURE PLUS) LIQD Take 1 Can by mouth 2 (two) times daily between meals.  . Ferrous Sulfate (IRON)  325 (65 Fe) MG TABS Take by mouth.  . traZODone (DESYREL) 100 MG tablet Take 1 tablet (100 mg total) by mouth at bedtime.   No facilityHicksadministered encounter medications on file as of 01/23/2020.   Ms. Holsclaw was given information about Hicks Management services today including:  1. Hicks Management services include personalized support from designated clinical staff supervised by her physician, including individualized plan of Hicks and coordination with other Hicks providers 2. 24/7 contact phone numbers for assistance for urgent and routine Hicks needs. 3. The patient may stop Hicks management services at any time (effective at the end of the month) by phone call to the office staff.  patient's daughter  verbally agreed to assistance and services provided by embedded Hicks coordination/Hicks management team today.  [name], LCSW

## 2020-01-27 ENCOUNTER — Inpatient Hospital Stay (HOSPITAL_COMMUNITY)
Admission: EM | Admit: 2020-01-27 | Discharge: 2020-06-10 | DRG: 057 | Disposition: A | Discharge status: Skilled Nursing Facility | Payer: Medicare Other | Attending: Internal Medicine | Admitting: Internal Medicine

## 2020-01-27 ENCOUNTER — Encounter (HOSPITAL_COMMUNITY): Payer: Self-pay

## 2020-01-27 DIAGNOSIS — F0391 Unspecified dementia with behavioral disturbance: Secondary | ICD-10-CM | POA: Diagnosis not present

## 2020-01-27 DIAGNOSIS — R441 Visual hallucinations: Secondary | ICD-10-CM | POA: Diagnosis present

## 2020-01-27 DIAGNOSIS — Z885 Allergy status to narcotic agent status: Secondary | ICD-10-CM

## 2020-01-27 DIAGNOSIS — Z8709 Personal history of other diseases of the respiratory system: Secondary | ICD-10-CM

## 2020-01-27 DIAGNOSIS — Z87891 Personal history of nicotine dependence: Secondary | ICD-10-CM

## 2020-01-27 DIAGNOSIS — G309 Alzheimer's disease, unspecified: Secondary | ICD-10-CM | POA: Diagnosis not present

## 2020-01-27 DIAGNOSIS — L89212 Pressure ulcer of right hip, stage 2: Secondary | ICD-10-CM

## 2020-01-27 DIAGNOSIS — F0281 Dementia in other diseases classified elsewhere with behavioral disturbance: Secondary | ICD-10-CM | POA: Diagnosis present

## 2020-01-27 DIAGNOSIS — F03918 Unspecified dementia, unspecified severity, with other behavioral disturbance: Secondary | ICD-10-CM | POA: Diagnosis present

## 2020-01-27 DIAGNOSIS — Z825 Family history of asthma and other chronic lower respiratory diseases: Secondary | ICD-10-CM

## 2020-01-27 DIAGNOSIS — F0280 Dementia in other diseases classified elsewhere without behavioral disturbance: Secondary | ICD-10-CM | POA: Diagnosis present

## 2020-01-27 DIAGNOSIS — G300 Alzheimer's disease with early onset: Secondary | ICD-10-CM | POA: Diagnosis not present

## 2020-01-27 DIAGNOSIS — Z79899 Other long term (current) drug therapy: Secondary | ICD-10-CM

## 2020-01-27 DIAGNOSIS — J4520 Mild intermittent asthma, uncomplicated: Secondary | ICD-10-CM | POA: Diagnosis present

## 2020-01-27 DIAGNOSIS — Z90710 Acquired absence of both cervix and uterus: Secondary | ICD-10-CM

## 2020-01-27 DIAGNOSIS — Z8249 Family history of ischemic heart disease and other diseases of the circulatory system: Secondary | ICD-10-CM

## 2020-01-27 DIAGNOSIS — Z7401 Bed confinement status: Secondary | ICD-10-CM

## 2020-01-27 DIAGNOSIS — R7401 Elevation of levels of liver transaminase levels: Secondary | ICD-10-CM | POA: Diagnosis present

## 2020-01-27 DIAGNOSIS — Z6820 Body mass index (BMI) 20.0-20.9, adult: Secondary | ICD-10-CM

## 2020-01-27 DIAGNOSIS — R9431 Abnormal electrocardiogram [ECG] [EKG]: Secondary | ICD-10-CM | POA: Diagnosis not present

## 2020-01-27 DIAGNOSIS — I10 Essential (primary) hypertension: Secondary | ICD-10-CM | POA: Diagnosis present

## 2020-01-27 DIAGNOSIS — F419 Anxiety disorder, unspecified: Secondary | ICD-10-CM | POA: Diagnosis present

## 2020-01-27 DIAGNOSIS — Z781 Physical restraint status: Secondary | ICD-10-CM

## 2020-01-27 DIAGNOSIS — F05 Delirium due to known physiological condition: Secondary | ICD-10-CM | POA: Diagnosis present

## 2020-01-27 DIAGNOSIS — Z888 Allergy status to other drugs, medicaments and biological substances status: Secondary | ICD-10-CM

## 2020-01-27 DIAGNOSIS — Z20822 Contact with and (suspected) exposure to covid-19: Secondary | ICD-10-CM | POA: Diagnosis present

## 2020-01-27 DIAGNOSIS — G4700 Insomnia, unspecified: Secondary | ICD-10-CM | POA: Diagnosis present

## 2020-01-27 DIAGNOSIS — R32 Unspecified urinary incontinence: Secondary | ICD-10-CM | POA: Diagnosis not present

## 2020-01-27 DIAGNOSIS — F339 Major depressive disorder, recurrent, unspecified: Secondary | ICD-10-CM | POA: Diagnosis present

## 2020-01-27 DIAGNOSIS — L8990 Pressure ulcer of unspecified site, unspecified stage: Secondary | ICD-10-CM | POA: Insufficient documentation

## 2020-01-27 DIAGNOSIS — E440 Moderate protein-calorie malnutrition: Secondary | ICD-10-CM | POA: Diagnosis present

## 2020-01-27 LAB — RESP PANEL BY RT-PCR (FLU A&B, COVID) ARPGX2
Influenza A by PCR: NEGATIVE
Influenza B by PCR: NEGATIVE
SARS Coronavirus 2 by RT PCR: NEGATIVE

## 2020-01-27 LAB — COMPREHENSIVE METABOLIC PANEL
ALT: 72 U/L — ABNORMAL HIGH (ref 0–44)
AST: 55 U/L — ABNORMAL HIGH (ref 15–41)
Albumin: 3.7 g/dL (ref 3.5–5.0)
Alkaline Phosphatase: 98 U/L (ref 38–126)
Anion gap: 8 (ref 5–15)
BUN: 19 mg/dL (ref 6–20)
CO2: 30 mmol/L (ref 22–32)
Calcium: 9.3 mg/dL (ref 8.9–10.3)
Chloride: 104 mmol/L (ref 98–111)
Creatinine, Ser: 0.49 mg/dL (ref 0.44–1.00)
GFR, Estimated: >60 mL/min (ref >60)
Glucose, Bld: 107 mg/dL — ABNORMAL HIGH (ref 70–99)
Potassium: 4.6 mmol/L (ref 3.5–5.1)
Sodium: 142 mmol/L (ref 135–145)
Total Bilirubin: 0.3 mg/dL (ref 0.3–1.2)
Total Protein: 6.4 g/dL — ABNORMAL LOW (ref 6.5–8.1)

## 2020-01-27 LAB — CBC WITH DIFFERENTIAL/PLATELET
Abs Immature Granulocytes: 0.01 10*3/uL (ref 0.00–0.07)
Basophils Absolute: 0 10*3/uL (ref 0.0–0.1)
Basophils Relative: 0 %
Eosinophils Absolute: 0 10*3/uL (ref 0.0–0.5)
Eosinophils Relative: 0 %
HCT: 41.9 % (ref 36.0–46.0)
Hemoglobin: 13.8 g/dL (ref 12.0–15.0)
Immature Granulocytes: 0 %
Lymphocytes Relative: 41 %
Lymphs Abs: 2.5 10*3/uL (ref 0.7–4.0)
MCH: 32.3 pg (ref 26.0–34.0)
MCHC: 32.9 g/dL (ref 30.0–36.0)
MCV: 98.1 fL (ref 80.0–100.0)
Monocytes Absolute: 0.3 10*3/uL (ref 0.1–1.0)
Monocytes Relative: 5 %
Neutro Abs: 3.2 10*3/uL (ref 1.7–7.7)
Neutrophils Relative %: 54 %
Platelets: 289 10*3/uL (ref 150–400)
RBC: 4.27 MIL/uL (ref 3.87–5.11)
RDW: 13.6 % (ref 11.5–15.5)
WBC: 6 10*3/uL (ref 4.0–10.5)
nRBC: 0 % (ref 0.0–0.2)

## 2020-01-27 MED ORDER — ACETAMINOPHEN 500 MG PO TABS
500.0000 mg | ORAL_TABLET | Freq: Four times a day (QID) | ORAL | Status: DC | PRN
  Administered 2020-03-04 – 2020-04-19 (×3): 500 mg via ORAL
  Filled 2020-01-27 (×3): qty 1

## 2020-01-27 MED ORDER — LORAZEPAM 1 MG PO TABS
1.0000 mg | ORAL_TABLET | ORAL | Status: AC | PRN
  Administered 2020-01-30: 16:00:00 1 mg via ORAL
  Filled 2020-01-27: qty 1

## 2020-01-27 MED ORDER — ZIPRASIDONE MESYLATE 20 MG IM SOLR
20.0000 mg | Freq: Once | INTRAMUSCULAR | Status: AC
  Administered 2020-01-27: 13:00:00 20 mg via INTRAMUSCULAR
  Filled 2020-01-27: qty 20

## 2020-01-27 MED ORDER — HALOPERIDOL LACTATE 5 MG/ML IJ SOLN
5.0000 mg | Freq: Once | INTRAMUSCULAR | Status: AC
  Administered 2020-01-27: 17:00:00 5 mg via INTRAMUSCULAR
  Filled 2020-01-27: qty 1

## 2020-01-27 MED ORDER — LORAZEPAM 2 MG/ML IJ SOLN
2.0000 mg | Freq: Once | INTRAMUSCULAR | Status: AC
  Administered 2020-01-27: 17:00:00 2 mg via INTRAVENOUS
  Filled 2020-01-27: qty 1

## 2020-01-27 MED ORDER — TRAZODONE HCL 100 MG PO TABS: 100.0000 mg | ORAL_TABLET | Freq: Every day | ORAL | Status: DC

## 2020-01-27 MED ORDER — ZIPRASIDONE MESYLATE 20 MG IM SOLR
20.0000 mg | Freq: Two times a day (BID) | INTRAMUSCULAR | Status: DC | PRN
  Administered 2020-01-28 – 2020-03-04 (×31): 20 mg via INTRAMUSCULAR
  Filled 2020-01-27 (×36): qty 20

## 2020-01-27 NOTE — ED Notes (Signed)
Patient taken out of soft restraints and is sleeping in the bed.

## 2020-01-27 NOTE — ED Notes (Signed)
Pt is refusing EKG

## 2020-01-27 NOTE — ED Provider Notes (Addendum)
[name]-EMERGENCY DEPT Provider Note   CSN: [id number] Arrival date & time: 01/27/20  1111     History Chief Complaint  Patient presents with  . Altered Mental Status    Sherry Hicks is a 59 y.o. female.  HPI     59 year old female comes in w/ chief complaint of altered mental status. Level 5 caveat for severe dementia.  I spoke with patient's daughter, [name], who has a medical POA and has been taking care of the patient and living with her.  She reports that she has been taking care of her patient for the last year.  Over the last 3 months her behavior has started getting worse.  Patient has been aggressive and hostile towards her and other family members.  Patient has been screaming a lot and does not sleep at all.  She was started on trazodone for sleep but that has not helped.  More recently she has started threatening to kill herself.  Patient has picked up knives, with threats of hurting herself.  There is an adult Dealer who has been assisting them and looking for placement.  Patient was advised by the social services to send the patient to the ER for psych evaluation and placement.  The counselor from social services was also come to the ER.  Additionally, [name] has given [place] the permission to treat the patient.  Patient is combative, hostile, and we informed the daughter that we might have to physically restrain her or give her medications to sedate her.  She is encouraging [place] to use any means possible to calm her mother down, as she thinks her mom needs some rest.    Past Medical History:  Diagnosis Date  . Alteration in performance of activities of daily living 08/29/2019  . Apraxia 08/29/2019  . Arthritis   . Dementia (HCC)   . Dementia with behavioral problem (HCC)   . History of asthma, mild, intermittent 02/14/2015  . Hypertension   . Smoker     Patient Active Problem List   Diagnosis Date Noted  .  History of mood disorder 08/29/2019  . Apraxia 08/29/2019  . Alteration in performance of activities of daily living 08/29/2019  . Caregiver stress 08/29/2019  . Early Onset Dementia with behavioral problem (HCC)   . Hyperlipidemia 07/30/2019  . Weight loss, unintentional 07/06/2019  . Hypernatremia 07/06/2019  . History of asthma, mild, intermittent 2017  . Major depressive disorder, recurrent episode (HCC) 04/12/2006  . HYPERTENSION, BENIGN SYSTEMIC 04/12/2006    Past Surgical History:  Procedure Laterality Date  . ANKLE SURGERY    . SALPINGOOPHORECTOMY    . TOTAL ABDOMINAL HYSTERECTOMY W/ BILATERAL SALPINGOOPHORECTOMY     for menorrhagia and pregnancy loss) and no longer requires GYN screening     OB History   No obstetric history on file.     Family History  Problem Relation Age of Onset  . Hypertension Mother   . Dementia Mother   . Asthma Father   . Cancer Father   . Canavan disease Sister   . Cancer Brother   . Diabetes Maternal Grandmother   . Hypertension Maternal Grandmother   . Asthma Paternal Grandfather   . Cancer Paternal Grandfather   . Cancer Other   . Diabetes Other   . Dementia Paternal Grandmother     Social History   Tobacco Use  . Smoking status: Current Every Day Smoker  . Smokeless tobacco: Never Used  Vaping Use  . Vaping Use: Never used  Substance Use Topics  . Alcohol use: No  . Drug use: No    Home Medications Prior to Admission medications   Medication Sig Start Date End Date Taking? Authorizing Provider  acetaminophen (TYLENOL) 500 MG tablet Take 500 mg by mouth every 6 (six) hours as needed for moderate pain.   Yes [provider]  traZODone (DESYREL) 100 MG tablet Take 1 tablet (100 mg total) by mouth at bedtime. 01/14/20  Yes [name], [name], DO  Ensure Plus (ENSURE PLUS) LIQD Take 1 Can by mouth 2 (two) times daily between meals. Patient not taking: No sig reported 11/18/19   [name], [name], DO     Allergies    Hydrocodone-acetaminophen and Donepezil  Review of Systems   Review of Systems  Unable to perform ROS: Psychiatric disorder    Physical Exam Updated Vital Signs BP 115/70   Pulse (!) 51   Resp 18   SpO2 100%   Physical Exam Vitals and nursing note reviewed.  Constitutional:      Appearance: She is not ill-appearing or diaphoretic.  HENT:     Head: Atraumatic.  Cardiovascular:     Rate and Rhythm: Normal rate.     Pulses: Normal pulses.  Pulmonary:     Effort: Pulmonary effort is normal.  Musculoskeletal:        General: No deformity.  Neurological:     General: No focal deficit present.     Mental Status: She is alert.     Comments: Pressure speech, hard to direct, appears manic     ED Results / Procedures / Treatments   Labs (all labs ordered are listed, but only abnormal results are displayed) Labs Reviewed  RESP PANEL BY RT-PCR (FLU A&B, COVID) ARPGX2  CBC WITH DIFFERENTIAL/PLATELET  COMPREHENSIVE METABOLIC PANEL  URINALYSIS, ROUTINE W REFLEX MICROSCOPIC  TSH    EKG EKG Interpretation  Date/Time:  Tuesday January 27 2020 16:31:50 EST Ventricular Rate:  57 PR Interval:    QRS Duration: 118 QT Interval:  460 QTC Calculation: 448 R Axis:   77 Text Interpretation: Sinus rhythm Probable left atrial enlargement Nonspecific intraventricular conduction delay No acute changes No significant change since last tracing Confirmed by Derwood Kaplan [phone]) on 01/27/2020 4:45:31 PM   Radiology No results found.  Procedures .Critical Care Performed by: Derwood Kaplan, MD Authorized by: Derwood Kaplan, MD   Critical care provider statement:    Critical care time (minutes):  52   Critical care was necessary to treat or prevent imminent or life-threatening deterioration of the following conditions:  CNS failure or compromise   Critical care was time spent personally by me on the following activities:  Discussions with consultants,  evaluation of patient's response to treatment, examination of patient, ordering and performing treatments and interventions, ordering and review of laboratory studies, ordering and review of radiographic studies, pulse oximetry, re-evaluation of patient's condition, obtaining history from patient or surrogate and review of old charts   (including critical care time)  Medications Ordered in ED Medications  ziprasidone (GEODON) injection 20 mg (has no administration in time range)    And  LORazepam (ATIVAN) tablet 1 mg (has no administration in time range)  ziprasidone (GEODON) injection 20 mg (20 mg Intramuscular Given 01/27/20 1238)  haloperidol lactate (HALDOL) injection 5 mg (5 mg Intramuscular Given 01/27/20 1636)  LORazepam (ATIVAN) injection 2 mg (2 mg Intravenous Given 01/27/20 1636)    ED Course  I have reviewed  the triage vital signs and the nursing notes.  Pertinent labs & imaging results that were available during my care of the patient were reviewed by me and considered in my medical decision making (see chart for details).  Clinical Course as of 01/27/20 1645  Tue Jan 27, 2020  1621 Patient remained combative.  We do not have any EKG.  She is not allowing our staff to try an IV or get EKG.  We have given her IM Geodon followed by IM Haldol.  We were able to secure an IV and 1 mg IV Ativan given.  Labs pending at this time. Consult to social work and TTS to be placed soon.   The underlying cause is going to be behavioral and not medical. [AN]    Clinical Course User Index [AN] Derwood Kaplan, MD   MDM Rules/Calculators/A&P                         59 year old female comes in a chief complaint of worsening behavior.  She resides with her daughter.  The daughter states that over the last 3 months she has become more hostile, aggressive and has made suicidal threats.  The daughter has medical POA.  She is already engaged social work for placement as an outpatient, however  recently her behavior has gotten significantly worse -and she was advised to send patient to the ER.  The daughter is medical POA.  She has given [place] the permission to treat her in the ER and manage her behavioral issues.  She is aware that we might have to physically restrain her or give chemical restraints if it is in the best interest of the safety of the patient and our providers.  Given that the medical POA has given [place] the permission we will not IVC the patient.  Patient is not providing any meaningful history given her advanced dementia.  Final Clinical Impression(s) / ED Diagnoses Final diagnoses:  Alzheimer's dementia with behavioral disturbance, unspecified timing of dementia onset [facility])    Rx / DC Orders ED Discharge Orders    None        Derwood Kaplan, MD 01/27/20 1645    Derwood Kaplan, MD 02/11/20 1646

## 2020-01-27 NOTE — ED Notes (Signed)
Pt uncooperative, refusing all care from staff

## 2020-01-27 NOTE — ED Notes (Signed)
Pt would not let her temperature be taken

## 2020-01-27 NOTE — ED Triage Notes (Signed)
Pt presents with c/o altered mental status. Per daughter, she has been expressing thoughts of SI and making verbal threats. Pt is incredibly confused and unable to answer any questions. Daughter says she is confused at baseline but pt is very hard to redirect. Pt will not allow this RN to obtain a full set of vital signs on her. Pt does deny SI when asked that question.

## 2020-01-27 NOTE — ED Notes (Signed)
Pt is too agitated for an EKG

## 2020-01-27 NOTE — ED Notes (Signed)
Pt will not allow this RN to obtain a full set of vitals. Pt will only allow BP.

## 2020-01-28 DIAGNOSIS — G309 Alzheimer's disease, unspecified: Secondary | ICD-10-CM | POA: Diagnosis not present

## 2020-01-28 LAB — TSH: TSH: 5.283 u[IU]/mL — ABNORMAL HIGH (ref 0.350–4.500)

## 2020-01-28 MED ORDER — HYDRALAZINE HCL 20 MG/ML IJ SOLN
10.0000 mg | Freq: Once | INTRAMUSCULAR | Status: AC
  Administered 2020-01-28: 04:00:00 10 mg via INTRAVENOUS
  Filled 2020-01-28: qty 1

## 2020-01-28 MED ORDER — STERILE WATER FOR INJECTION IJ SOLN
INTRAMUSCULAR | Status: AC
  Filled 2020-01-28: qty 10

## 2020-01-28 MED ORDER — STERILE WATER FOR INJECTION IJ SOLN
1.2000 mL | Freq: Once | INTRAMUSCULAR | Status: AC
  Administered 2020-01-28: 19:00:00 1.2 mL via INTRAMUSCULAR

## 2020-01-28 MED ORDER — HYDRALAZINE HCL 25 MG PO TABS
25.0000 mg | ORAL_TABLET | Freq: Once | ORAL | Status: AC
  Administered 2020-02-16: 25 mg via ORAL
  Filled 2020-01-28: qty 1

## 2020-01-28 NOTE — ED Notes (Signed)
Patient agitated. Patient appears to have hallucinations, is confused, is trying to remove equipment and exit the bed. Patient in restraints and Geodon administered.

## 2020-01-28 NOTE — ED Provider Notes (Signed)
Emergency Medicine Observation Re-evaluation Note  Sherry Hicks is a 59 y.o. female, seen on rounds today.  Pt initially presented to the ED for complaints of Altered Mental Status Currently, the patient is sleeping.  Physical Exam  BP (!) 133/115   Pulse 74   Resp 11   SpO2 100%  Physical Exam General: sleeping and NAD Cardiac: regular rate Lungs: CTAB, no distress Psych: sleeping but has been calm and cooperative.  ED Course / MDM  EKG:EKG Interpretation  Date/Time:  Tuesday January 27 2020 16:31:50 EST Ventricular Rate:  57 PR Interval:    QRS Duration: 118 QT Interval:  460 QTC Calculation: 448 R Axis:   77 Text Interpretation: Sinus rhythm Probable left atrial enlargement Nonspecific intraventricular conduction delay No acute changes No significant change since last tracing Confirmed by Derwood Kaplan [phone]) on 01/27/2020 4:45:31 PM  Clinical Course as of 01/28/20 1452  Tue Jan 27, 2020  1621 Patient remained combative.  We do not have any EKG.  She is not allowing our staff to try an IV or get EKG.  We have given her IM Geodon followed by IM Haldol.  We were able to secure an IV and 1 mg IV Ativan given.  Labs pending at this time. Consult to social work and TTS to be placed soon.   The underlying cause is going to be behavioral and not medical. [AN]    Clinical Course User Index [AN] Derwood Kaplan, MD   I have reviewed the labs performed to date as well as medications administered while in observation.  Recent changes in the last 24 hours include pt has been combative and required geodon.  Pt has been sleeping and comfortable today.  Plan  Current plan is for social work looking for SNF placement.  Psych still trying to evaluated but difficult due to sedation from geodon.  TSH is mildly elevated at 5.28 and T3 and T4 sent. Patient is not under full IVC at this time.   Gwyneth Sprout, MD 01/28/20 [phone]

## 2020-01-28 NOTE — ED Notes (Signed)
Patient will not hold thermometer under her tongue

## 2020-01-28 NOTE — B[facility] Counselor (Signed)
Sherry Dumas, RN, notified through secure chat that per Assunta Found, NP, patient is psychiatrically cleared and should follow up with social work for SNF placement due to patient lack of ability to complete ADL's and take care of herself.

## 2020-01-28 NOTE — ED Notes (Signed)
Spoke with Dr.Zammit regarding elevated BP. MD aware

## 2020-01-28 NOTE — Evaluation (Signed)
Physical Therapy Evaluation Patient Details Name: Sherry Hicks MRN: [id number] DOB: [birth year] Today's Date: 01/28/2020   History of Present Illness  59 yo female with H/O  dementia, asthma, apraxia, admitted  from home where she is cared for by her daughter.  Patient admitted due to need for higher level of care.  Clinical Impression  The patient aroused, was  Restless with mitts in place. Patient assisted to sitting then able to take steps to [facility] with 2 persons for safety. Multimodal cues for sitting down onto BSC and back onto stretcher. Patient attempting removal of mitts. 1:1 sitter in room to assist with mobility. Pt admitted with above diagnosis. Pt currently with functional limitations due to the deficits listed below (see PT Problem List). Pt will benefit from skilled PT to increase their independence and safety with mobility to allow discharge to the venue listed below.       Follow Up Recommendations SNF    Equipment Recommendations    none   Recommendations for Other Services       Precautions / Restrictions Precautions Precautions: Fall Precaution Comments: requires redirection, in mitts      Mobility  Bed Mobility Overal bed mobility: Needs Assistance Bed Mobility: Supine to Sit;Sit to Supine     Supine to sit: Min assist;+2 for physical assistance;+2 for safety/equipment Sit to supine: Mod assist;+2 for physical assistance;+2 for safety/equipment   General bed mobility comments: frequent  multimodal cues for safety to sit up, Patient impulsive. Assisted to lie down  as  patient was staying standing up, not following directions    Transfers Overall transfer level: Needs assistance Equipment used: 2 person hand held assist Transfers: Sit to/from Stand Sit to Stand: Min assist;+2 physical assistance;+2 safety/equipment         General transfer comment: patient readily stands up. multimodal cues to transfer to [facility] , tactile cues to sit down on [facility].  patient easily stands from BSc, min guard for stability. Requires 2 persons to safely take a few steps back to stretcher ansas  Patient was very fidgety with lines and mits.  Ambulation/Gait             General Gait Details: to [facility] a few steps only  Stairs            Wheelchair Mobility    Modified Rankin (Stroke Patients Only)       Balance Overall balance assessment: Needs assistance Sitting-balance support: Feet supported;No upper extremity supported Sitting balance-Leahy Scale: Fair Sitting balance - Comments: close  guardingf due to restlessness   Standing balance support: Bilateral upper extremity supported;During functional activity Standing balance-Leahy Scale: Fair                               Pertinent Vitals/Pain Pain Assessment: Faces Faces Pain Scale: No hurt    Home Living Family/patient expects to be discharged to:: Private residence Living Arrangements: Children Available Help at Discharge: Family             Additional Comments: no information about home lay out and  level of  function patient currently requires.    Prior Function           Comments: unsure     Hand Dominance        Extremity/Trunk Assessment   Upper Extremity Assessment Upper Extremity Assessment: Overall WFL for tasks assessed    Lower Extremity Assessment Lower Extremity Assessment: Generalized weakness  Cervical / Trunk Assessment Cervical / Trunk Assessment: Normal  Communication   Communication:  (random comments, not pertinent)  Cognition Arousal/Alertness: Awake/alert Behavior During Therapy: Anxious;Impulsive Overall Cognitive Status: Impaired/Different from baseline Area of Impairment: Orientation;Attention;Following commands;Safety/judgement;Awareness                 Orientation Level: Place;Time Current Attention Level: Focused   Following Commands: Follows one step commands consistently Safety/Judgement:  Decreased awareness of safety Awareness: Intellectual   General Comments: H/O dementia      General Comments      Exercises     Assessment/Plan    PT Assessment Patient needs continued PT services  PT Problem List Decreased mobility;Decreased knowledge of precautions;Decreased activity tolerance;Decreased cognition;Decreased balance;Decreased knowledge of use of DME       PT Treatment Interventions DME instruction;Therapeutic activities;Gait training;Therapeutic exercise;Patient/family education;Functional mobility training    PT Goals (Current goals can be found in the Care Plan section)  Acute Rehab PT Goals PT Goal Formulation: Patient unable to participate in goal setting Time For Goal Achievement: 02/11/20 Potential to Achieve Goals: Fair    Frequency Min 2X/week   Barriers to discharge        Co-evaluation               AM-PAC PT "6 Clicks" Mobility  Outcome Measure Help needed turning from your back to your side while in a flat bed without using bedrails?: A Little Help needed moving from lying on your back to sitting on the side of a flat bed without using bedrails?: A Lot Help needed moving to and from a bed to a chair (including a wheelchair)?: A Lot Help needed standing up from a chair using your arms (e.g., wheelchair or bedside chair)?: A Lot Help needed to walk in hospital room?: A Lot Help needed climbing 3-5 steps with a railing? : Total 6 Click Score: 12    End of Session Equipment Utilized During Treatment: Oxygen Activity Tolerance: Patient tolerated treatment well Patient left: in bed;with call bell/phone within reach;with nursing/sitter in room Nurse Communication: Mobility status PT Visit Diagnosis: Unsteadiness on feet (R26.81);Difficulty in walking, not elsewhere classified (R26.2)    Time: 9628-3662 PT Time Calculation (min) (ACUTE ONLY): 11 min   Charges:   PT Evaluation $PT Eval Low Complexity: 1 Low          Blanchard Kelch  PT Acute Rehabilitation Services Pager [phone] Office [phone]   Rada Hay 01/28/2020, 3:48 PM

## 2020-01-28 NOTE — ED Provider Notes (Signed)
Blood pressure has been significantly elevated throughout the evening.  Patient has no known history of hypertension.  Will give dose of hydralazine and observe.   Dione Booze, MD 01/28/20 [phone]

## 2020-01-28 NOTE — ED Notes (Signed)
Spoke with Dr.Glick regarding pt's elevated BP.

## 2020-01-28 NOTE — BH Assessment (Addendum)
Comprehensive Clinical Assessment (CCA) Note  01/28/2020 Sherry Hicks [id number]  Sherry Hicks is a 59 year old female presenting to [facility] with chief complaint of SI and AMS. During assessment patient is very agitated, trying to get out the bed, not making eye contact and saying repeatedly Im ready to go home, let me go home. Patient is looking around the room and making statements about getting her stuff so she can leave. Patient appears to be responding to internal stimuli during assessment. Clinician unable to assess patient for AVH. Patient becomes agitated by clinician questions. Patient could not tell clinician her date of birth but is able to state her name. Patient could not state where she was and when asked who she live with patient reports living with her parents. Patient reports ED visit is due to a fall. Patient attention span is short and she was unable to be redirected to complete full assessment. Patient denies SI/HI.   Collateral information obtained from patient daughter Sherry Hicks. Daughter reports that patient has been living with her for the past year and during this time patient has little moments when she says she wants to kill herself and others, makes statements that she wishes she was dead, becomes verbally aggressive and has SIB of hitting herself and jumping off objects in the house. Daughter reports that patient has difficulty sleeping and sometimes patient stays up for three days followed but increased agitation, combative behaviors, yelling, cursing and not eating. Daughter denies that patient has attempted suicide while being with her. Daughter reports difficulty taking care of patient due to having children of her own and patient behavioral issues related to diagnosis of dementia. Daughter reports that patient is [facility] and they have been attempting to help her with placing patient in a SNF. Patient reports barriers of incorrect FL2  information and patient not wanting to get vaccination. Daughter reports waiting for [facility] to call her to see if they will accept her. Daughter reports that patient has a prior diagnosis of depression and receives disability for mental health issues. Daughter informed that patient is psychiatrically cleared and that social work will continue to asses her with locating SNF.   Disposition: Per Shuvon Rankin, NP, patient is psychiatrically cleared and should follow up with social work for SNF placement due to patient lack of ability to complete ADLs and take care of herself.                        Chief Complaint:  Chief Complaint  Patient presents with   Altered Mental Status      CCA Screening, Triage and Referral (STR)  Patient Reported Information How did you hear about [place]? No data recorded Referral name: No data recorded Referral phone number: No data recorded  Whom do you see for routine medical problems? No data recorded Practice/Facility Name: No data recorded Practice/Facility Phone Number: No data recorded Name of Contact: No data recorded Contact Number: No data recorded Contact Fax Number: No data recorded Prescriber Name: No data recorded Prescriber Address (if known): No data recorded  What Is the Reason for Your Visit/Call Today? No data recorded How Long Has This Been Causing You Problems? No data recorded What Do You Feel Would Help You the Most Today? No data recorded  Have You Recently Been in Any Inpatient Treatment (Hospital/Detox/Crisis Center/28-Day Program)? No data recorded Name/Location of Program/Hospital:No data recorded How Long Were You There? No data recorded When  Were You Discharged? No data recorded  Have You Ever Received Services From [facility] Before? No data recorded Who Do You See at [facility]? No data recorded  Have You Recently Had Any Thoughts About Hurting Yourself? No data recorded Are You Planning to Commit Suicide/Harm  Yourself At This time? No data recorded  Have you Recently Had Thoughts About Hurting Someone [name]? No data recorded Explanation: No data recorded  Have You Used Any Alcohol or Drugs in the Past 24 Hours? No data recorded How Long Ago Did You Use Drugs or Alcohol? No data recorded What Did You Use and How Much? No data recorded  Do You Currently Have a Therapist/Psychiatrist? No data recorded Name of Therapist/Psychiatrist: No data recorded  Have You Been Recently Discharged From Any Office Practice or Programs? No data recorded Explanation of Discharge From Practice/Program: No data recorded    CCA Screening Triage Referral Assessment Type of Contact: No data recorded Is this Initial or Reassessment? No data recorded Date Telepsych consult ordered in [facility]:  No data recorded Time Telepsych consult ordered in [facility]:  No data recorded  Patient Reported Information Reviewed? No data recorded Patient Left Without Being Seen? No data recorded Reason for Not Completing Assessment: No data recorded  Collateral Involvement: No data recorded  Does Patient Have a Court Appointed Legal Guardian? No data recorded Name and Contact of Legal Guardian: No data recorded If Minor and Not Living with Parent(s), Who has Custody? No data recorded Is CPS involved or ever been involved? No data recorded Is APS involved or ever been involved? No data recorded  Patient Determined To Be At Risk for Harm To Self or Others Based on Review of Patient Reported Information or Presenting Complaint? No data recorded Method: No data recorded Availability of Means: No data recorded Intent: No data recorded Notification Required: No data recorded Additional Information for Danger to Others Potential: No data recorded Additional Comments for Danger to Others Potential: No data recorded Are There Guns or Other Weapons in Your Home? No data recorded Types of Guns/Weapons: No data recorded Are These Weapons Safely  Secured?                            No data recorded Who Could Verify You Are Able To Have These Secured: No data recorded Do You Have any Outstanding Charges, Pending Court Dates, Parole/Probation? No data recorded Contacted To Inform of Risk of Harm To Self or Others: No data recorded  Location of Assessment: No data recorded  Does Patient Present under Involuntary Commitment? No data recorded IVC Papers Initial File Date: No data recorded  [place] of Residence: No data recorded  Patient Currently Receiving the Following Services: No data recorded  Determination of Need: No data recorded  Options For Referral: No data recorded    CCA Biopsychosocial Intake/Chief Complaint:  SI  Current Symptoms/Problems: None   Patient Reported Schizophrenia/Schizoaffective Diagnosis in Past: No data recorded  Strengths: UTA  Preferences: UTA  Abilities: UTA   Type of Services Patient Feels are Needed: UTA   Initial Clinical Notes/Concerns: No data recorded  Mental Health Symptoms Depression:  None   Duration of Depressive symptoms: No data recorded  Mania:  Irritability; Change in energy/activity   Anxiety:   Irritability; Sleep   Psychosis:  Hallucinations   Duration of Psychotic symptoms: Less than six months   Trauma:  N/A   Obsessions:  N/A   Compulsions:  N/A   Inattention:  N/A   Hyperactivity/Impulsivity:  N/A   Oppositional/Defiant Behaviors:  N/A   Emotional Irregularity:  N/A   Other Mood/Personality Symptoms:  No data recorded   Mental Status Exam Appearance and self-care  Stature:  Average   Weight:  Thin   Clothing:  No data recorded  Grooming:  Bizarre   Cosmetic use:  None   Posture/gait:  Rigid   Motor activity:  Agitated   Sensorium  Attention:  Confused; Distractible   Concentration:  Scattered   Orientation:  Person   Recall/memory:  Defective in Immediate; Defective in Short-term   Affect and Mood  Affect:  Labile    Mood:  Irritable   Relating  Eye contact:  None   Facial expression:  Tense   Attitude toward examiner:  Irritable; Uninterested   Thought and Language  Speech flow: Loud; Pressured   Thought content:  No data recorded  Preoccupation:  None   Hallucinations:  Other (Comment)   Organization:  No data recorded  [employer]utive Functions  Fund of Knowledge:  Impoverished by (Comment)   Intelligence:  No data recorded  Abstraction:  No data recorded  Judgement:  Impaired   Reality Testing:  Distorted   Insight:  No data recorded  Decision Making:  No data recorded  Social Functioning  Social Maturity:  No data recorded  Social Judgement:  No data recorded  Stress  Stressors:  Illness   Coping Ability:  No data recorded  Skill Deficits:  Activities of daily living; Communication; Decision making; Intellect/education; Interpersonal; Responsibility; Self-care; Self-control   Supports:  Family     Religion:    Leisure/Recreation:    Exercise/Diet: Exercise/Diet Have You Gained or Lost A Significant Amount of Weight in the Past Six Months?: Yes-Lost Do You Have Any Trouble Sleeping?: Yes   CCA Employment/Education Employment/Work Situation: Employment / Work Situation Employment situation: On disability Why is patient on disability: Mental Health  Education:     CCA Family/Childhood History Family and Relationship History: Family history What is your sexual orientation?: UTA Has your sexual activity been affected by drugs, alcohol, medication, or emotional stress?: UTA Does patient have children?: Yes  Childhood History:  Childhood History Additional childhood history information: UTA Description of patient's relationship with caregiver when they were a child: UTA Patient's description of current relationship with people who raised him/her: UTA How were you disciplined when you got in trouble as a child/adolescent?: UTA  Child/Adolescent Assessment:      CCA Substance Use Alcohol/Drug Use: Alcohol / Drug Use Pain Medications: See MAR Prescriptions: See MAR Over the Counter: See MAR                         ASAM's:  Six Dimensions of Multidimensional Assessment  Dimension 1:  Acute Intoxication and/or Withdrawal Potential:      Dimension 2:  Biomedical Conditions and Complications:      Dimension 3:  Emotional, Behavioral, or Cognitive Conditions and Complications:     Dimension 4:  Readiness to Change:     Dimension 5:  Relapse, Continued use, or Continued Problem Potential:     Dimension 6:  Recovery/Living Environment:     ASAM Severity Score:    ASAM Recommended Level of Treatment:     Substance use Disorder (SUD)    Recommendations for Services/Supports/Treatments:    DSM5 Diagnoses: Patient Active Problem List   Diagnosis Date Noted   History of mood disorder  08/29/2019   Apraxia 08/29/2019   Alteration in performance of activities of daily living 08/29/2019   Caregiver stress 08/29/2019   Early Onset Dementia with behavioral problem (HCC)    Hyperlipidemia 07/30/2019   Weight loss, unintentional 07/06/2019   Hypernatremia 07/06/2019   History of asthma, mild, intermittent 2017   Major depressive disorder, recurrent episode (HCC) 04/12/2006   HYPERTENSION, BENIGN SYSTEMIC 04/12/2006    Disposition: Per Shuvon Rankin, NP, patient is psychiatrically cleared and should follow up with social work for SNF placement due to patient lack of ability to complete ADLs and take care of herself.          Jaymi Tinner Shirlee More, [facility]

## 2020-01-28 NOTE — BH Assessment (Signed)
TTS attempted to see patient: Per Vyskocil, Kyra Searles, RN, Patient has been sedated and is asleep and is unable to be assessed.  TTS will attempt later

## 2020-01-28 NOTE — BHH Counselor (Signed)
Attempted assessment.  Pt still sleeping.

## 2020-01-28 NOTE — ED Notes (Signed)
Patients daughter, also POA, wants to be contacted before giving her meds and wants to be updated every so often on her condition

## 2020-01-28 NOTE — ED Notes (Signed)
Attempted to complete suicide assessment. Patient will not answer any questions at this time.

## 2020-01-28 NOTE — Progress Notes (Signed)
..   Transition of Care [facility]) - Emergency Department Mini Assessment   Patient Details  Name: Sherry Hicks MRN: [id number] Date of Birth: [birth year]  [facility]) CM/SW Contact:    Mirra Basilio C Tarpley-Carter, LCSWA Phone Number: 01/28/2020, 11:19 AM   Clinical Narrative: TOC CM/CSW spoke with [name] Andaya/pts daughter due to pts Dementia.  [name] is seeking SNF placement.  [name] has given CSW permission to seek SNF placement for pt.  Kamerin Axford Tarpley-Carter, MSW, LCSW-A Pronouns:  She, Her, Hers                  [facility] ED Transitions of CareClinical Social Worker [email or web address] [phone]   ED Mini Assessment: What brought you to the Emergency Department? : Altered Mental Status  Barriers to Discharge: No Barriers Identified     Means of departure: Not know  Interventions which prevented an admission or readmission: SNF Placement    Patient Contact and Communications Key Contact 1: Charolotte Eke   Spoke with: Charolotte Eke Contact Date: 01/28/20,   Contact time: 1037 Contact Phone Number: [phone] Call outcome: [name]/pts daughter would like a SNF placement.      Choice offered to / list presented to : Adult Children  Admission diagnosis:  SI, Psy eval Patient Active Problem List   Diagnosis Date Noted  . History of mood disorder 08/29/2019  . Apraxia 08/29/2019  . Alteration in performance of activities of daily living 08/29/2019  . Caregiver stress 08/29/2019  . Early Onset Dementia with behavioral problem (HCC)   . Hyperlipidemia 07/30/2019  . Weight loss, unintentional 07/06/2019  . Hypernatremia 07/06/2019  . History of asthma, mild, intermittent 2017  . Major depressive disorder, recurrent episode (HCC) 04/12/2006  . HYPERTENSION, BENIGN SYSTEMIC 04/12/2006   PCP:  [name], DO Pharmacy:   [facility] [phone] [place], [place] - [phone] [address] AT [facility] [address] [place] [address] Phone: [phone] Fax: [phone]

## 2020-01-28 NOTE — [place] FL2 (Addendum)
[place] MEDICAID FL2 LEVEL OF CARE SCREENING TOOL     IDENTIFICATION  Patient Name: Sherry Hicks Birthdate: January 10, 1961 Sex: female Admission Date (Current Location): 01/27/2020  [place] and [place] Number:  Sherry Hicks [id number] [facility] Facility and Address:  [facility],  [address], [place] [address]      Provider Number: [id number]  Attending Physician Name and Address:  Default, Provider, MD  Relative Name and Phone Number:  Sherry Hicks [phone]    Current Level of Care: Hospital Recommended Level of Care: Memory Care Prior Approval Number:    Date Approved/Denied:   PASRR Number: [id number]  Discharge Plan: Other (Comment) (Memory Care)    Current Diagnoses: Patient Active Problem List   Diagnosis Date Noted   History of mood disorder 08/29/2019   Apraxia 08/29/2019   Alteration in performance of activities of daily living 08/29/2019   Caregiver stress 08/29/2019   Early Onset Dementia with behavioral problem (HCC)    Hyperlipidemia 07/30/2019   Weight loss, unintentional 07/06/2019   Hypernatremia 07/06/2019   History of asthma, mild, intermittent 2017   Major depressive disorder, recurrent episode (HCC) 04/12/2006   HYPERTENSION, BENIGN SYSTEMIC 04/12/2006    Orientation RESPIRATION BLADDER Height & Weight     Self,Place,Situation  Normal Incontinent Weight:   Height:     BEHAVIORAL SYMPTOMS/MOOD NEUROLOGICAL BOWEL NUTRITION STATUS      Incontinent Diet (Regular)  AMBULATORY STATUS COMMUNICATION OF NEEDS Skin   Limited Assist Verbally Normal                       Personal Care Assistance Level of Assistance    Bathing Assistance: Limited assistance Feeding assistance: Independent Dressing Assistance: Limited assistance     Functional Limitations Info  Sight,Hearing,Speech Sight Info: Adequate Hearing Info: Adequate Speech Info: Adequate    SPECIAL CARE FACTORS FREQUENCY  PT (By licensed PT),OT (By  licensed OT)     PT Frequency: 5 times a week OT Frequency: 5 times a week            Contractures Contractures Info: Not present    Additional Factors Info  Code Status,Allergies Code Status Info: Full Code Allergies Info: Hydrocodone (acetaminophen) and Donepezil           Current Medications (01/28/2020):  This is the current hospital active medication list Current Facility-Administered Medications  Medication Dose Route Frequency Provider Last Rate Last Admin   acetaminophen (TYLENOL) tablet 500 mg  500 mg Oral Q6H PRN Derwood Kaplan, MD       hydrALAZINE (APRESOLINE) tablet 25 mg  25 mg Oral Once Dione Booze, MD       ziprasidone (GEODON) injection 20 mg  20 mg Intramuscular Q12H PRN Derwood Kaplan, MD       And   LORazepam (ATIVAN) tablet 1 mg  1 mg Oral PRN Derwood Kaplan, MD       traZODone (DESYREL) tablet 100 mg  100 mg Oral QHS Derwood Kaplan, MD       Current Outpatient Medications  Medication Sig Dispense Refill   acetaminophen (TYLENOL) 500 MG tablet Take 500 mg by mouth every 6 (six) hours as needed for moderate pain.     traZODone (DESYREL) 100 MG tablet Take 1 tablet (100 mg total) by mouth at bedtime. 30 tablet 3   Ensure Plus (ENSURE PLUS) LIQD Take 1 Can by mouth 2 (two) times daily between meals. (Patient not taking: No sig reported) 14220 mL 2  Discharge Medications: Please see discharge summary for a list of discharge medications.  Relevant Imaging Results:  Relevant Lab Results:   Additional Information SSN#:  [id number]  Calen Posch C Tarpley-Carter, LCSWA

## 2020-01-29 DIAGNOSIS — G309 Alzheimer's disease, unspecified: Secondary | ICD-10-CM | POA: Diagnosis not present

## 2020-01-29 LAB — T4: T4, Total: 9 ug/dL (ref 4.5–12.0)

## 2020-01-29 LAB — T3: T3, Total: 72 ng/dL (ref 71–180)

## 2020-01-29 MED ORDER — STERILE WATER FOR INJECTION IJ SOLN
INTRAMUSCULAR | Status: AC
  Filled 2020-01-29: qty 10

## 2020-01-29 MED ORDER — QUETIAPINE FUMARATE 50 MG PO TABS
50.0000 mg | ORAL_TABLET | Freq: Every day | ORAL | Status: DC
  Administered 2020-01-30 – 2020-02-03 (×6): 50 mg via ORAL
  Filled 2020-01-29 (×6): qty 1

## 2020-01-29 MED ORDER — HYDROXYZINE HCL 50 MG/ML IM SOLN
50.0000 mg | Freq: Four times a day (QID) | INTRAMUSCULAR | Status: DC | PRN
  Administered 2020-01-30 – 2020-02-08 (×11): 50 mg via INTRAMUSCULAR
  Filled 2020-01-29 (×11): qty 1

## 2020-01-29 MED ORDER — LORAZEPAM 2 MG/ML IJ SOLN
1.0000 mg | Freq: Once | INTRAMUSCULAR | Status: AC
  Administered 2020-01-29: 16:00:00 1 mg via INTRAMUSCULAR
  Filled 2020-01-29: qty 1

## 2020-01-29 NOTE — ED Notes (Signed)
Pt rounding complete. Pt resting comfortably with bed in lowest position and side rails up x2 with sitter at bedside. Vitals updated. All needs met at this time. Will continue to monitor.

## 2020-01-29 NOTE — ED Provider Notes (Signed)
Emergency Medicine Observation Re-evaluation Note  Sherry Hicks is a 59 y.o. female, seen on rounds today.  Pt initially presented to the ED for complaints of Altered Mental Status Currently, the patient is sleeping  Physical Exam  BP (!) 160/96   Pulse (!) 50   Temp 98.1 F (36.7 C) (Oral)   Resp (!) 9   SpO2 100%  Physical Exam General: awake Psych: stable  ED Course / MDM  EKG:EKG Interpretation  Date/Time:  Tuesday January 27 2020 16:31:50 EST Ventricular Rate:  57 PR Interval:    QRS Duration: 118 QT Interval:  460 QTC Calculation: 448 R Axis:   77 Text Interpretation: Sinus rhythm Probable left atrial enlargement Nonspecific intraventricular conduction delay No acute changes No significant change since last tracing Confirmed by Derwood Kaplan [phone]) on 01/27/2020 4:45:31 PM  Clinical Course as of 01/29/20 0719  Tue Jan 27, 2020  1621 Patient remained combative.  We do not have any EKG.  She is not allowing our staff to try an IV or get EKG.  We have given her IM Geodon followed by IM Haldol.  We were able to secure an IV and 1 mg IV Ativan given.  Labs pending at this time. Consult to social work and TTS to be placed soon.   The underlying cause is going to be behavioral and not medical. [AN]    Clinical Course User Index [AN] Derwood Kaplan, MD   I have reviewed the labs performed to date as well as medications administered while in observation.  Recent changes in the last 24 hours include cleared by psych.  Plan  Current plan is for SNF, social work. Patient is not under full IVC at this time.   Virgina Norfolk, DO 01/29/20 0720

## 2020-01-29 NOTE — ED Notes (Signed)
Unable to obtain pt's temperature. This writer attempted to obtain oral temp but pt unable to follow commands. Writer then attempted to obtain axillary temp but thermometer would not read. Other vital signs charted.

## 2020-01-29 NOTE — Consult Note (Signed)
Patient Location:  [facility] ED Provider Location: [facility]   Attempted to see Sherry Hicks, 59 y.o., female patient via tele psych but unable to get patient to stay awake.  Patient was able to only give her name before falling back to sleep.   Consulted with Dr. Bronwen Betters; and chart reviewed on 01/29/20.    Patient presented to [facility] ED with complaints of altered mental status and patients daughter whom patient lives with reported that patient has been become very agitated, aggressive behavior, and has threaten to kill herself. Patient has been tried on Remeron, Trazodone, and Risperdal.      TTS assessment noted that patient was not a good historian; noted that patient looking around the room; possible hallucinations; also noted that patient stated she was ready to go home.  Collateral information gathered from daughter.      Patient seen by primary provider 01/14/20 Dr. [name] [name].  Per doctor assessment note:   Insomnia due to other mental disorder Previously taking trazodone 50 mg. Will attempted an increased dose.  - trazodone (DESYREL) 100 MG tablet; Take 1 tablet (100 mg total) by mouth at bedtime.  Dispense: 30 tablet; Refill: 3  Dementia with behavioral disturbance, unspecified dementia type [facility])  Caregiver stress Daughter has been caring for mother at home along with infant child. Home aid services changed x1 but continued to have staffing issue. Would like mother to go to SNF. FL2 form discussed with daughter and Child psychotherapist. Daughter would like placement prior to new year and states [facility] has bed open now. Fax and email of facility obtain [phone]; [email or web address] -FL2 form to be completed  Daughter has been working with primary provider and social worker attempting to get patient placed in skilled nursing facility.     Recommendation:  Discontinue Trazodone. Start Seroquel 50 mg Q hs for sleep, mood, and agitation.  If continues to have agitation during  morning evening hours may add 12.5 mg Seroquel morning and afternoon dose.   Will continue to monitor medication management.  Because of patient continued mental and physical decline related to dementia patient would be better suited skilled nursing memory care unit.  Daughters primary concern was that she was unable to care for her mother at home with young infant; and patients wondering at night.  Some behavioral changes is expected with dementia and medications can be added but not a great impact on the progression of disease.  Social work should continue to seek placement for this patient.    Secure message sent to Dr. Lockie Mola, social work and treatment team informing:  Patient has shown some confusion while in the ED; and has also said she wanted to go home; which may have caused some of patient agitation.  Patient has also refused vital and EKG.  I have started some medication and made suggestions for adjustments that may help with agitation during the day.  According to chart review patient's daughter has been working with her PCP for a while to get patient placed in a SNF but has been unsuccessful.  Patient will more likely be in ED for a while if seeking geropsychiatric placement related to her condition and unable to perform ADL's and needing assistance with incontinence.  Psychiatry can continue to monitory and recommend psychiatric placement; but would more likely find memory care SNF quicker which will prevent from further decline while in ED.  We are willing to work with social work whichever way feel appropriate for patient.  Sherry Kader B. Rumi Kolodziej, NP

## 2020-01-29 NOTE — ED Notes (Signed)
Report given to Beth RN.

## 2020-01-29 NOTE — ED Notes (Signed)
Hourly rounding complete. Pt resting comfortably with bed in lowest position and side rails up x2. Sitter at bedside. Pt in soft restraints for staff and patient protection. Vitals updated. All needs met at this time. Will continue to monitor.

## 2020-01-29 NOTE — ED Provider Notes (Signed)
3:30 PM-I was called to the room because of agitation.  Currently patient standing in room, holding antibiotics, saying "I have to leave now."  She has restraints on her wrist with the long tails dangling onto the floor, and is at risk for tripping and falling.  She was verbally directable to sit down.  Sitter is with her.  Psychiatry has ordered an evening dose of Seroquel but she has not taken it yet.  Will give IM medication at this time, and encourage nursing to give her her dose of Seroquel soon.   Mancel Bale, MD 01/30/20 1005

## 2020-01-29 NOTE — ED Notes (Signed)
Hourly rounding complete. Pt resting comfortably with bed in lowest position and side rails up x2. Vitals updated. All needs met at this time. Will continue to monitor. 

## 2020-01-29 NOTE — ED Notes (Signed)
With provider's approval, writer d/c'ed administration of supplemental O2. Pt's oxygen saturation level WN at 100% on room air.

## 2020-01-29 NOTE — ED Notes (Signed)
Pt asleep and resting comfortably at this time. Sitter at pt bedside.

## 2020-01-29 NOTE — Progress Notes (Addendum)
TOC CM/CSW currently does not feel that pt is inappropriate for placement at this time.  CSW would not be able to place pt who is exhibiting combative behaviors and has restraint use.    TOC is signing off, may re-consult once behaviors are controlled and no restraints have been needed for 24 hours or more.  Mariya Mottley Tarpley-Carter, MSW, LCSW-A Pronouns:  She, Her, Hers                  [facility] Transitions of CareClinical Social Worker [email or web address] [phone]

## 2020-01-29 NOTE — Progress Notes (Signed)
TOC CM/CSW began [place]  Start Date:  01/29/2020  Reference #:  [id number]  Fax #:  [phone]  CSW will continue to follow for dc needs.  Brooke Payes Tarpley-Carter, MSW, LCSW-A Pronouns:  She, Her, Hers                  [facility] Transitions of CareClinical Social Worker [email or web address] [phone]

## 2020-01-29 NOTE — ED Notes (Signed)
With provider's approval, writer d/c'ed soft restraints including ankles x2, wrists x2, and mittens. Pt resting comfortably, is calm, pleasant, and non-combative. Sitter remains at bedside and door is opened.

## 2020-01-29 NOTE — ED Notes (Signed)
Pt escalating. Provider notified and 1mg  ativan ordered. This tried to avoid giving geodon as that is a more aggressive approach and per case mgmt, pt is to be off restraints for 24 hours before reevaluation and eventual placement. Geodon drawn prophylacticly but not administered. Contents of geodon bottle given to Clinical research associate, [employer].

## 2020-01-30 DIAGNOSIS — G309 Alzheimer's disease, unspecified: Secondary | ICD-10-CM | POA: Diagnosis not present

## 2020-01-30 MED ORDER — STERILE WATER FOR INJECTION IJ SOLN
INTRAMUSCULAR | Status: AC
  Filled 2020-01-30: qty 10

## 2020-01-30 NOTE — ED Notes (Signed)
Pt continues to try and bite staff.

## 2020-01-30 NOTE — ED Notes (Signed)
Pt has a skin tear on her back, Band-Aid applied. She keeps itching at skin tear, prn medication given for itching.

## 2020-01-30 NOTE — ED Notes (Signed)
NT proceeded to take patient her lunch tray and patient attempted to eat pack of salad dressing whole. Patient then proceeded to yell out "It is time to go, shut the F**K up ... It is time to go, get me out of here now." Patient increasingly getting agitated and has made repetitive efforts to get up out of the bed. Attempted to redirect patient and security called to bedside. Medication administered (Geodon) as ordered prn. Will continue to monitor patient.

## 2020-01-30 NOTE — Progress Notes (Signed)
TOC CM received call from [facility], [place]. States needs information on pt's prior functioning before hospitalization from family. Reference #[id number]. TOC CM contacted dtr, Charolotte Eke [phone]. States pt was able to feed herself and use the bathroom. She had a few falls at home as she was unsteady on feet. The dementia made her forgetful but she did follow instructions. Isidoro Donning RN CCM, [facility] ED TOC CM [phone]

## 2020-01-30 NOTE — ED Notes (Signed)
Pt was trying to get out of her room to go [place] the train station. She was redirected back to her bed and given coloring books. Pt is calm at this time.

## 2020-01-30 NOTE — ED Notes (Signed)
Pt is screaming no and saying lets go. She is attempting to bite the staff and is also trying to bite herself, she is kicking and grabbing onto staff. She is being redirected to stay in the bed. Staff has been at bedside for 15 minutes to keep her safe in bed. Pt is using curse words and is agitated. Prn medications given as ordered for anxiety and agitation. Pt is not oriented to time and place.

## 2020-01-30 NOTE — Progress Notes (Signed)
TOC CM received message for [facility] and pt was approved for SNF rehab. They will need an accepting facility. Will give call to [facility] once received. Isidoro Donning RN CCM, [facility] ED TOC CM [phone]

## 2020-01-30 NOTE — Progress Notes (Signed)
Received call from Ronald Lobo, APS CSW [phone] states he is following this pt and he has spoken to the daughter. He understands dtr is unable to care for her in the home and is wanting placement. Will continue to follow patient. Isidoro Donning RN CCM, [facility] ED TOC CM [phone]

## 2020-01-30 NOTE — ED Notes (Signed)
Pt is cursing at staff. She is being redirected to stay in bed. Pt offered food and water.

## 2020-01-30 NOTE — BH Assessment (Signed)
[facility] Assessment Progress Note  This voluntary pt has been staffed with Earlene Plater, MD and Assunta Found, NP this morning.  They find that pt's needs at this time are secondary to dementia.  Pt does not require psychiatric hospitalization and is psychiatrically cleared.  This Clinical research associate has communicated these findings to EDP Lorre Nick, MD, as well and Kathyrn Sheriff, RN and Edwin Dada, LCSW.  Doylene Canning, [place] Behavioral Health Coordinator [phone]

## 2020-01-30 NOTE — ED Notes (Signed)
During pericare patient got aggressive and grab staff"s clothing while fighting and shouting

## 2020-01-30 NOTE — ED Notes (Signed)
Pt fed dinner and gown changed.

## 2020-01-30 NOTE — ED Notes (Signed)
Pt continues to scream for her mom and dad. She is screaming to let her go home. She is trying to bite herself and the staff. She is kicking and trying to leave her bed and her room. She continues to be non compliant at staying in her bed, staff has to remain at bedside.

## 2020-01-31 DIAGNOSIS — G309 Alzheimer's disease, unspecified: Secondary | ICD-10-CM | POA: Diagnosis not present

## 2020-01-31 MED ORDER — STERILE WATER FOR INJECTION IJ SOLN
INTRAMUSCULAR | Status: AC
  Administered 2020-01-31: 07:00:00 10 mL
  Filled 2020-01-31: qty 10

## 2020-01-31 MED ORDER — ENSURE ENLIVE PO LIQD
237.0000 mL | Freq: Two times a day (BID) | ORAL | Status: DC
  Administered 2020-01-31 – 2020-06-10 (×226): 237 mL via ORAL
  Filled 2020-01-31 (×121): qty 237

## 2020-01-31 MED ORDER — STERILE WATER FOR INJECTION IJ SOLN
INTRAMUSCULAR | Status: AC
  Filled 2020-01-31: qty 10

## 2020-01-31 MED ORDER — STERILE WATER FOR INJECTION IJ SOLN
INTRAMUSCULAR | Status: AC
  Administered 2020-01-31: 10 mL
  Filled 2020-01-31: qty 10

## 2020-01-31 NOTE — ED Notes (Signed)
Pt yelling cuss words loudly, wiggling to the point that she is at the bottom of the bed and needs to have the soft leg restraints for her safety as well as the wrist restraints.

## 2020-01-31 NOTE — ED Notes (Signed)
Patient in her room by herself having a conversation with herself.

## 2020-01-31 NOTE — ED Notes (Signed)
Pt's daughter POA said that sometimes pt responds well to food and cookies keeping her in place. Pt is alert but confused and not oriented. Would not respond with an answer when asked what her name is or what her daughter's name is.

## 2020-01-31 NOTE — ED Provider Notes (Signed)
Emergency Medicine Observation Re-evaluation Note  Sherry Hicks is a 59 y.o. female, seen on rounds today.  Pt initially presented to the ED for complaints of Altered Mental Status Currently, the patient is calm, alert.   Physical Exam  BP 125/89 (BP Location: Right Arm)    Pulse 79    Temp (!) 97.5 F (36.4 C)    Resp 16    SpO2 93%  Physical Exam General: calm, no distress.  Cardiac: normal.  Lungs: no increased wob. Psych: calm, alert.   ED Course / MDM  EKG:EKG Interpretation  Date/Time:  Tuesday January 27 2020 16:31:50 EST Ventricular Rate:  57 PR Interval:    QRS Duration: 118 QT Interval:  460 QTC Calculation: 448 R Axis:   77 Text Interpretation: Sinus rhythm Probable left atrial enlargement Nonspecific intraventricular conduction delay No acute changes No significant change since last tracing Confirmed by Derwood Kaplan [phone]) on 01/27/2020 4:45:31 PM  Clinical Course as of 01/31/20 0952  Tue Jan 27, 2020  1621 Patient remained combative.  We do not have any EKG.  She is not allowing our staff to try an IV or get EKG.  We have given her IM Geodon followed by IM Haldol.  We were able to secure an IV and 1 mg IV Ativan given.  Labs pending at this time. Consult to social work and TTS to be placed soon.   The underlying cause is going to be behavioral and not medical. [AN]    Clinical Course User Index [AN] Derwood Kaplan, MD   I have reviewed the labs performed to date as well as medications administered while in observation.  Recent changes in the last 24 hours include BH reassessment, rec for placement/SW consulted.   Plan  Current plan is for placement and stabilization with meds.   The patient has been placed in psychiatric observation due to the need to provide a safe environment for the patient while obtaining psychiatric consultation and evaluation, as well as ongoing medical and medication management to treat the patient's condition.        Cathren Laine, MD 01/31/20 [phone]

## 2020-01-31 NOTE — ED Notes (Signed)
Pt is awake and refused to have VS again.

## 2020-01-31 NOTE — ED Notes (Signed)
Pt cleaned and changed. Ate dinner.

## 2020-01-31 NOTE — TOC Progression Note (Signed)
Transition of Care [facility]) - Progression Note    Patient Details  Name: Sherry Hicks MRN: [id number] Date of Birth: 04-30-1960  Transition of Care [facility]) CM/SW Contact  Lockie Pares, RN Phone Number: 01/31/2020, 1:54 PM  Clinical Narrative:     Continue awaiting SNF placement, no new activity in HUB no acceptances, still pending and declines. Will continue to monitor.     Barriers to Discharge: No Barriers Identified  Expected Discharge Plan and Services                                                 Social Determinants of Health (SDOH) Interventions    Readmission Risk Interventions No flowsheet data found.

## 2020-01-31 NOTE — ED Notes (Signed)
Pt is cursing at staff and trying to get out of bed. Pt is kicking at the staff. She is being redirected to stay in bed. Pt is being cleaned up and changed.

## 2020-01-31 NOTE — ED Notes (Signed)
Pt is not redirecting back to her room. She is up to to the nurse's station, yelling, resisting staff redirection to her room. Almost went into another pt's room.

## 2020-01-31 NOTE — ED Notes (Signed)
Pt reports itching. 

## 2020-01-31 NOTE — ED Notes (Signed)
Pt refused VS  

## 2020-02-01 MED ORDER — STERILE WATER FOR INJECTION IJ SOLN
INTRAMUSCULAR | Status: AC
  Administered 2020-02-01: 15:00:00 10 mL
  Filled 2020-02-01: qty 10

## 2020-02-01 NOTE — Progress Notes (Deleted)
Note created in error.  Edwin Dada, MSW, LCSW-A Transitions of Care  Clinical Social Worker I [facility] Emergency Departments  Medical ICU [phone]

## 2020-02-01 NOTE — Progress Notes (Signed)
CSW reviewed HUB - the patient does not have any bed offers at this time. CSW will reach out to facilities to request a review.  Edwin Dada, MSW, LCSW-A Transitions of Care  Clinical Social Worker I [facility] Emergency Departments  Medical ICU [phone]

## 2020-02-01 NOTE — ED Notes (Signed)
Pt sleeping, unable to obtain VS at this time. 

## 2020-02-01 NOTE — ED Notes (Signed)
Pt became agitated and uncooperative with care. Yelling and uncooperative, resistive with staff, difficult to redirect.  PRN Geodon 20 mg IM given

## 2020-02-01 NOTE — ED Notes (Signed)
Pt cussing at 3 NT while they performed peri-care and VS check.

## 2020-02-01 NOTE — ED Notes (Signed)
Pt alert. Pt ate breakfast and lunch. Pt continues to call out and yell a people not there. Confused. Nonsensical. Labile with thoughts and emotions.

## 2020-02-01 NOTE — ED Notes (Signed)
PT UP ND EATING BREAKFAST

## 2020-02-01 NOTE — ED Notes (Signed)
Agitated, yelling, difficult to redirect, uncooperative with care .

## 2020-02-02 DIAGNOSIS — G309 Alzheimer's disease, unspecified: Secondary | ICD-10-CM | POA: Diagnosis not present

## 2020-02-02 MED ORDER — STERILE WATER FOR INJECTION IJ SOLN
INTRAMUSCULAR | Status: AC
  Administered 2020-02-02: 16:00:00 10 mL
  Filled 2020-02-02: qty 10

## 2020-02-02 NOTE — ED Provider Notes (Signed)
Emergency Medicine Observation Re-evaluation Note  Sherry Hicks is a 59 y.o. female, seen on rounds today.  Pt initially presented to the ED for complaints of Altered Mental Status Currently, the patient is calm and resting.  Physical Exam  BP 140/80 (BP Location: Left Arm)   Pulse 84   Temp 98.7 F (37.1 C) (Oral)   Resp 18   SpO2 96%  Physical Exam General: No acute distress Cardiac: Regular rate Lungs: Breathing easily Psych: Appears to be talking to herself  ED Course / MDM  EKG:EKG Interpretation  Date/Time:  Tuesday January 27 2020 16:31:50 EST Ventricular Rate:  57 PR Interval:    QRS Duration: 118 QT Interval:  460 QTC Calculation: 448 R Axis:   77 Text Interpretation: Sinus rhythm Probable left atrial enlargement Nonspecific intraventricular conduction delay No acute changes No significant change since last tracing Confirmed by Derwood Kaplan [phone]) on 01/27/2020 4:45:31 PM  Clinical Course as of 02/02/20 1040  Tue Jan 27, 2020  1621 Patient remained combative.  We do not have any EKG.  She is not allowing our staff to try an IV or get EKG.  We have given her IM Geodon followed by IM Haldol.  We were able to secure an IV and 1 mg IV Ativan given.  Labs pending at this time. Consult to social work and TTS to be placed soon.   The underlying cause is going to be behavioral and not medical. [AN]    Clinical Course User Index [AN] Derwood Kaplan, MD   I have reviewed the labs performed to date as well as medications administered while in observation.  Recent changes in the last 24 hours include patient was agitated overnight and required Geodon.  Plan  Current plan is for inpatient psychiatric treatment.    Linwood Dibbles, MD 02/02/20 1040

## 2020-02-02 NOTE — ED Notes (Signed)
Patient is calm but very confused. Patient is wandering all over her room and knocking on walls and doors. Patient is able to be redirected but shortly returns to disruptive behavior. Patient will continued to be monitored by sitter.

## 2020-02-02 NOTE — Progress Notes (Signed)
02/02/2020  Patient laying in bed having a conversation with someone she is calling daddy. She is telling them to sit down and to shut up.

## 2020-02-02 NOTE — Progress Notes (Signed)
PHYSICAL THERAPY  Upon arriving to unit pt continuously pacing back and forth in her room without physical assist and having a full blown conversation with someone but no one else is there.  Safety sitter reported "she moves well.  Gets up to and from bathroom".  Pt has been evaluated with rec for SNF.  Will continue to monitor.  Felecia Shelling  PTA Acute  Rehabilitation Services Pager      [phone] Office      [phone]

## 2020-02-02 NOTE — ED Notes (Signed)
Staff unable to get patient temp. Patient was uncooperative.

## 2020-02-03 DIAGNOSIS — G309 Alzheimer's disease, unspecified: Secondary | ICD-10-CM | POA: Diagnosis not present

## 2020-02-03 NOTE — Progress Notes (Addendum)
TOC CM/CSW pt is currently not appropriate for SNF placement due to the behaviors that pt is exhibiting, and does not meet criteria due to her mobility.  CSW will continue to follow for dc needs.  Cristofher Livecchi Tarpley-Carter, MSW, LCSW-A Pronouns:  She, Her, Hers                  [facility] Transitions of CareClinical Social Worker [email or web address] [phone]

## 2020-02-03 NOTE — ED Notes (Signed)
Pt alert this shift. Pt confused, nonsensical. Pt not able to answer assessment questions. Pt restless. Pt actively hallucinating.  Pt labile with thoughts and emotions  Difficult to redirect. Pt resistive to personal care.

## 2020-02-03 NOTE — ED Notes (Signed)
Reported to nurse, daughter does not want her mother medicated without being notified first.

## 2020-02-03 NOTE — ED Notes (Signed)
Patient had bowel movement. Patient was cleaned and brief changed. Patient became agitated when peri care was being provided. 3 staff members had to assist at the time. Patient's room was cleaned, however patient continues to mess up room. Patient is being monitored by beside.

## 2020-02-03 NOTE — ED Provider Notes (Signed)
Emergency Medicine Observation Re-evaluation Note  Sherry Hicks is a 59 y.o. female, seen on rounds today.  Pt initially presented to the ED for complaints of Altered Mental Status Currently, the patient is sleeping and resting comfortably.  Physical Exam  BP (!) 158/145 (BP Location: Right Arm) Comment: Uncooperative; Wouldn't sit still  Pulse (!) 112   Temp 98.4 F (36.9 C) (Oral)   Resp 18   SpO2 94%  Physical Exam General: NAD and sleeping Cardiac: RRR Lungs: No respiratory distress Psych: currently sleeping but pt becomes agitated and not taking po meds.  Requiring multiple IM doses throughout the day  ED Course / MDM  EKG:EKG Interpretation  Date/Time:  Tuesday January 27 2020 16:31:50 EST Ventricular Rate:  57 PR Interval:    QRS Duration: 118 QT Interval:  460 QTC Calculation: 448 R Axis:   77 Text Interpretation: Sinus rhythm Probable left atrial enlargement Nonspecific intraventricular conduction delay No acute changes No significant change since last tracing Confirmed by Derwood Kaplan [phone]) on 01/27/2020 4:45:31 PM  Clinical Course as of 02/03/20 1448  Tue Jan 27, 2020  1621 Patient remained combative.  We do not have any EKG.  She is not allowing our staff to try an IV or get EKG.  We have given her IM Geodon followed by IM Haldol.  We were able to secure an IV and 1 mg IV Ativan given.  Labs pending at this time. Consult to social work and TTS to be placed soon.   The underlying cause is going to be behavioral and not medical. [AN]    Clinical Course User Index [AN] Derwood Kaplan, MD   I have reviewed the labs performed to date as well as medications administered while in observation.  Recent changes in the last 24 hours include geodon 1609 yesterday for agitation and escalating behavior and then vistaril overnight.  Plan  Current plan is for social work is seen the patient reports she is too acute for any placement at this time.  Psych had signed  off of the patient days ago.  We will have TTS reevaluate as patient at this time not on any other psychiatric medications    Gwyneth Sprout, MD 02/03/20 1452

## 2020-02-03 NOTE — ED Notes (Signed)
Pt agitated, yelling out, hallucinating, uncooperative with redirection, resistive with care and safety measures. PRN  Vistaril 50 mg IM given, not effective.

## 2020-02-03 NOTE — ED Notes (Signed)
Daughter called and does not want the patient to be given any behavior medication without her being notified. She also does not want her mother to be given anything so that she will be awake when she comes to visit in the morning.

## 2020-02-04 ENCOUNTER — Encounter (HOSPITAL_COMMUNITY): Payer: Self-pay | Admitting: Registered Nurse

## 2020-02-04 DIAGNOSIS — G309 Alzheimer's disease, unspecified: Secondary | ICD-10-CM | POA: Diagnosis not present

## 2020-02-04 MED ORDER — QUETIAPINE FUMARATE 50 MG PO TABS
50.0000 mg | ORAL_TABLET | Freq: Every day | ORAL | Status: DC
  Administered 2020-02-05 – 2020-02-08 (×4): 50 mg via ORAL
  Filled 2020-02-04 (×5): qty 1

## 2020-02-04 MED ORDER — QUETIAPINE FUMARATE 50 MG PO TABS
50.0000 mg | ORAL_TABLET | Freq: Three times a day (TID) | ORAL | Status: DC
  Administered 2020-02-04: 10:00:00 50 mg via ORAL
  Filled 2020-02-04: qty 1

## 2020-02-04 MED ORDER — QUETIAPINE FUMARATE 25 MG PO TABS
25.0000 mg | ORAL_TABLET | Freq: Two times a day (BID) | ORAL | Status: DC
  Administered 2020-02-05 – 2020-02-09 (×7): 25 mg via ORAL
  Filled 2020-02-04 (×7): qty 1

## 2020-02-04 NOTE — ED Notes (Signed)
Vital signs attempt 3 times by two staff Sherry Hicks is repeatedly refusing to allow staff to get vital signs.

## 2020-02-04 NOTE — ED Provider Notes (Signed)
Emergency Medicine Observation Re-evaluation Note  Sherry Hicks is a 59 y.o. female, seen on rounds today.  Pt initially presented to the ED for complaints of Altered Mental Status Currently, the patient is awake talking to herself.  Physical Exam  BP 125/82 (BP Location: Right Arm)   Pulse 68   Temp 98.1 F (36.7 C) (Oral)   Resp 18   SpO2 99%  Physical Exam General: Hyperactive Cardiac: Regular rate and rhythm Lungs: Breathing easily Psych: Appears to be responding to internal stimuli, rambling speech  ED Course / MDM  EKG:EKG Interpretation  Date/Time:  Tuesday January 27 2020 16:31:50 EST Ventricular Rate:  57 PR Interval:    QRS Duration: 118 QT Interval:  460 QTC Calculation: 448 R Axis:   77 Text Interpretation: Sinus rhythm Probable left atrial enlargement Nonspecific intraventricular conduction delay No acute changes No significant change since last tracing Confirmed by Derwood Kaplan [id number]) on 01/27/2020 4:45:31 PM  Clinical Course as of 02/04/20 0957  Tue Jan 27, 2020  1621 Patient remained combative.  We do not have any EKG.  She is not allowing our staff to try an IV or get EKG.  We have given her IM Geodon followed by IM Haldol.  We were able to secure an IV and 1 mg IV Ativan given.  Labs pending at this time. Consult to social work and TTS to be placed soon.   The underlying cause is going to be behavioral and not medical. [AN]    Clinical Course User Index [AN] Derwood Kaplan, MD   I have reviewed the labs performed to date as well as medications administered while in observation.  Recent changes in the last 24 hours include Continued continued uncooperative behavior.  Patient continues to needs assistance with bowel and bladder continence.  Patient is currently not stable for skilled nursing facility placement.  Psychiatry has signed off so a request for psychiatric evaluation was rerequested for medication recommendations  Plan  Current plan is  for snf placement although not a candidate at this time.  Will need psychiatry assistance.Linwood Dibbles, MD 02/04/20 1000

## 2020-02-04 NOTE — Consult Note (Signed)
Psychiatric Consult   Sherry Hicks, 59 y.o., female with history of dementia with behavioral disturbances was  seen via telepsych by TTS and this provider; chart reviewed and consulted with Dr. Lucianne Muss on 02/04/20.  Sherry Hicks  initially presented to [facility] ED with complaints of altered mental status.  Medication adjustments made and patient psychiatrically cleared on 01/29/2020 related to progression of dementia and physical decline patient better suited for SNF/memory care unit placement.     On evaluation Sherry Hicks is sitting on side of bed.  Patient is talking to someone that is not there but she is speaking as if she is a younger person "You gone get me in trouble.  We ain't suppose to drank no alcohol."  Patient would make statements to someone not there randomly throughout assessment.  Patient however would not answer any questions that were asked only talk to the person not there and made comments to the nurse.  Patient was calm.  A complete assessment is unable to be done related to the acuity of patient condition.  Patient is oriented to her self and calm at time of assessment; but talked more to nurse and not to the tele psych machine.  Patient not a good historian; she appears to be living in the past and talking to someone from past that is not present in room (auditory and visual hallucinations).  Patient was speaking ian a clear tone and moderate volume.  According to chart review patient has had periods of refusing treatment, restlessness, yelling out and has been difficult to redirect at time.    Progressive dementia can include things such visual hallucinations, difficulty focusing and concentrating.  Patient will still benefit from placement in skill nursing and/or memory care unit.  There are going to be behavioral issues also related to patient confusion at times.  Medication adjustments can be made but patient will still need placement if family is unable to care for at home.       Medication adjustments made to psychotropic medications: Seroquel 25 mg Bid (morning and evening dose) Continue Seroquel 50 mg Q hs.    Recommendation:  Social work to continue seeking placement skilled nursing facility.    Disposition:  Psychiatric cleared.  Social work to continue working with patient for SNF placement   Khamiya Varin B. Jacaden Forbush, NP

## 2020-02-04 NOTE — TOC Progression Note (Signed)
Reviewed pt's record. Psychiatry has made new medication adjustments. TOC will follow up with SNF referrals when pt's behavior stabilizes.

## 2020-02-04 NOTE — ED Notes (Signed)
Sherry Hicks refused vitals signs stated she did not want too.  Pt incont bowel and bladder requiring 3 staff to clean her d/t her confused behavior. Sherry Hicks continues have a rambling conversation but required minimal redirection to stay in her room. No aggressive bx toward staff but continues to curse thru her rants.

## 2020-02-05 DIAGNOSIS — G309 Alzheimer's disease, unspecified: Secondary | ICD-10-CM | POA: Diagnosis not present

## 2020-02-05 MED ORDER — LORAZEPAM 1 MG PO TABS
2.0000 mg | ORAL_TABLET | Freq: Once | ORAL | Status: AC
  Administered 2020-02-05: 20:00:00 2 mg via ORAL
  Filled 2020-02-05: qty 2

## 2020-02-05 NOTE — ED Notes (Signed)
Pt standing in corner, yelling, "Let's go!" Pushing at wall, as if she trying to push her way through. Continues to respond to auditory/visual hallucinations, repeating, "Let's go!"

## 2020-02-05 NOTE — ED Notes (Signed)
Patient had stripped down in room. RN and NT Kendal Hymen attempted to take patient into shower to shower. Patient initially cooperative but once in shower room began to kick, scream and fight. RN and NT attempted to clean patient and she had a BM. Patient smeared her fingers in the BM and proceeded to grab at staff. Kendal Hymen and Max scratched, this RN's breast was grabbed and twisted. Patient screaming kicking, and poured soap on the ground and attempted to hit staff. Patient screaming she "will knock the fuck out of you!" at security and staff.  Patient assisted to the bed and given Geodon.

## 2020-02-05 NOTE — ED Notes (Signed)
Pt continues to yell, trying to leave. Stating someone is coming to get her. Demanding to leave. Yelling at MHT when being redirected to stay in room.

## 2020-02-05 NOTE — ED Notes (Signed)
Patient's daughter called and updated on patient condition and on patient getting

## 2020-02-05 NOTE — ED Notes (Signed)
MD China made aware that patient had to be given PRN Geodon. Charge RN notified of event.

## 2020-02-05 NOTE — ED Notes (Signed)
Patient sat down on the floor and went to sleep sitting on floor and leaning against the bed.

## 2020-02-05 NOTE — ED Notes (Addendum)
After multiple attempts to verbally de-escalate Sherry Hicks was given po ativan which proved ineffective so I will add IM vistaril to HS medications.

## 2020-02-05 NOTE — ED Provider Notes (Signed)
Patient has been agitated per nursing. She is currently not fighting or yelling but has had worsening behavior. Will try PO ativan and she'll need reassessment.    Pricilla Loveless, MD 02/05/20 1924

## 2020-02-05 NOTE — ED Notes (Signed)
Patient is resting. RN able to use alcohol swabs to clean under patient's nails at daughter's request while she is resting.

## 2020-02-05 NOTE — ED Notes (Signed)
Attempted to call the patient's daughter to inform her of behavioral event. Unable to reach. Left a HIPAA compliant voicemail.

## 2020-02-05 NOTE — ED Notes (Signed)
Patient is confused and refusing to wear yellow socks, non-redirectable. Patient sat down hard in the floor and was confused. RN made MD aware. Patient denies any pain. Patient has asked multiple times if she can sit on the floor and this RN redirected patient back to bed. No injuries noted. Patient alert but not oriented and is at baseline. Patient is responding to stimuli

## 2020-02-05 NOTE — ED Notes (Signed)
Patient is very confused at this time. Patient willing to take medication that this RN placed in mouth and put straw to lips to drink. Patient is responding to people who are not in the room and pacing. Patient agitated and pulling off socks and refusing to shower. Patient is not alert to time, place, situation, or self.   Will attempt to obtain EKG following breakfast

## 2020-02-05 NOTE — ED Notes (Signed)
Patient resting peacefully at this time in the bed. Will continue to monitor patient.

## 2020-02-05 NOTE — ED Notes (Signed)
Patient is sleeping at this time. Will continue to monitor. Patient did not sleep last night.

## 2020-02-05 NOTE — ED Notes (Signed)
Pt aggressive, kicking at MHT, demanding to leave. Pt stating her grandmother is coming to get her. Pt talking to self, as if several people are in the room with her. Responding to auditory and visual hallucinations. Pt is verbally aggressive.

## 2020-02-05 NOTE — Progress Notes (Signed)
PT Cancellation Note  Patient Details Name: Sherry Hicks MRN: [id number] DOB: 08-May-1960   Cancelled Treatment:    Reason Eval/Treat Not Completed: Other (comment) (noted pt was agitated and aggressive with staff today. Will hold PT today.)   Maile, Linford PT 02/05/2020  Acute Rehabilitation Services Pager [phone] Office [phone]

## 2020-02-05 NOTE — ED Notes (Signed)
Patient is sleeping and got no rest last night. Patient a high concern for delirium. Will take patient's vitals when she awakens. Patient stable at this time.

## 2020-02-06 NOTE — ED Notes (Signed)
RN administered 20mg  PRN Geodon due to pt exhibiting escalating behavior with concern for violence such as grabbing onto staff and trying to leave her room. Staff were also concerned about her risk for falls because of her agitation and impaired sense of balance. Staff first attempted to verbally deescalate and redirect patient, but were unsuccessful. This RN checked vital signs 10 minutes after Geodon administration while patient was resting safely in bed. V/S were HR 80, SPO2 100 on RA, BP 91/66 (MAP 75).

## 2020-02-06 NOTE — Progress Notes (Signed)
Physical Therapy Discharge Patient Details Name: Sherry Hicks MRN: [id number] DOB: 02/21/1960 Today's Date: 02/06/2020 Time:  -     Patient discharged from PT services secondary to patient does not require skilled PT intervention. Noted , patient is ambulatory.  Please see latest therapy progress note for current level of functioning and progress toward goals.    Progress and discharge plan discussed with patient and/or caregiver: NA GP     Rada Hay 02/06/2020, 6:56 AM Blanchard Kelch PT Acute Rehabilitation Services Pager [phone] Office [phone]

## 2020-02-06 NOTE — Discharge Planning (Signed)
RNCM reviewed pt with EDP.  EDP to order Psych consult for recommendations.

## 2020-02-06 NOTE — ED Notes (Signed)
Checked pt, still dry, resting comfortably.

## 2020-02-07 DIAGNOSIS — G309 Alzheimer's disease, unspecified: Secondary | ICD-10-CM | POA: Diagnosis not present

## 2020-02-07 MED ORDER — STERILE WATER FOR INJECTION IJ SOLN
INTRAMUSCULAR | Status: AC
  Filled 2020-02-07: qty 10

## 2020-02-07 NOTE — ED Notes (Signed)
Patient swearing at tech and nurse and security officer saying "I don't give a fuck what time it is I need to go." Staff were able verbally deescalate patient, place her back in her bed and calm her down despite verbal aggression.

## 2020-02-07 NOTE — ED Notes (Signed)
The pt is very agitated and restless, redirection efforts are unsuccessful. Sitter at the bedside, scheduled  meds given, I will continue to monitor.

## 2020-02-07 NOTE — ED Provider Notes (Signed)
Emergency Medicine Observation Re-evaluation Note  Sherry Hicks is a 59 y.o. female, seen on rounds today.  Pt initially presented to the ED for complaints of Altered Mental Status Currently, the patient is awaiting SNF placement  Physical Exam  BP 118/83 (BP Location: Right Arm)   Pulse (!) 53   Temp 98.6 F (37 C) (Axillary)   Resp 18   SpO2 100%  Physical Exam General: resting  Psych: stable  ED Course / MDM  EKG:EKG Interpretation  Date/Time:  Thursday February 05 2020 09:44:03 EST Ventricular Rate:  90 PR Interval:  146 QRS Duration: 74 QT Interval:  354 QTC Calculation: 433 R Axis:   74 Text Interpretation: Normal sinus rhythm Possible Left atrial enlargement Borderline ECG more wandering baseline than prior. No STEMI Confirmed by Theda Belfast ([id number]) on 02/06/2020 5:59:13 PM  Clinical Course as of 02/07/20 0729  Tue Jan 27, 2020  1621 Patient remained combative.  We do not have any EKG.  She is not allowing our staff to try an IV or get EKG.  We have given her IM Geodon followed by IM Haldol.  We were able to secure an IV and 1 mg IV Ativan given.  Labs pending at this time. Consult to social work and TTS to be placed soon.   The underlying cause is going to be behavioral and not medical. [AN]    Clinical Course User Index [AN] Derwood Kaplan, MD   I have reviewed the labs performed to date as well as medications administered while in observation.  Recent changes in the last 24 hours include nothing.  Plan  Current plan is for SNF placement.    Virgina Norfolk, DO 02/07/20 0730

## 2020-02-07 NOTE — ED Notes (Signed)
Pt alert. Agitated. Yelling. Uncooperative and resistive to care.

## 2020-02-07 NOTE — ED Notes (Signed)
Pt responding to internal stimuli, yelling "come Fletch, come on. Let's go!"

## 2020-02-07 NOTE — ED Notes (Signed)
Patient woke up in the middle of the night and began pacing anxiously, nurse administered Seroquel that was previously withheld when pt was too sedated to take medication, in hopes of helping patient sleep.

## 2020-02-07 NOTE — ED Notes (Signed)
Pt agitated, uncooperative, kicking, difficult to redirect. PRN Geodon 20 mg IM given

## 2020-02-08 MED ORDER — STERILE WATER FOR INJECTION IJ SOLN
INTRAMUSCULAR | Status: AC
  Administered 2020-02-08: 15:00:00 1.2 mL
  Filled 2020-02-08: qty 10

## 2020-02-08 MED ORDER — LORAZEPAM 2 MG/ML IJ SOLN
2.0000 mg | Freq: Once | INTRAMUSCULAR | Status: AC
  Administered 2020-02-08: 23:00:00 2 mg via INTRAMUSCULAR
  Filled 2020-02-08: qty 1

## 2020-02-08 NOTE — Progress Notes (Signed)
02/08/2020  1345  Patient was in her room resting peacefully. Daughter came and we asked her to return in an hours d/t patient has just finally laid down for a nap. Daughter in the lobby fussing and cussing at staff. Went to lobby to speak with daughter and asked her to come back and try not to wake her mother. She said ok. Walked daughter back to her mom's room #31 as soon as she arrived in the room she yelling hey mom, waking patient.

## 2020-02-08 NOTE — TOC Progression Note (Signed)
Transition of Care [facility]) - Progression Note    Patient Details  Name: Sherry Hicks MRN: [id number] Date of Birth: 1960-08-26  Transition of Care [facility]) CM/SW Contact  Joseph Art, [place] Phone Number: [phone] 02/08/2020, 12:56 PM  Clinical Narrative:     Patient still exhibiting aggressive behaviors, and is difficult to redirect with continued confusion. Patient is pending placement in memory care facility. Medication management in place to assist with behavioral issues and confusion.  TOC will continue to follow and look for placement. [name],[name] (Daughter)  [phone] main contact and POA.    Barriers to Discharge: No Barriers Identified  Expected Discharge Plan and Services                                                 Social Determinants of Health (SDOH) Interventions    Readmission Risk Interventions No flowsheet data found.

## 2020-02-08 NOTE — Progress Notes (Signed)
Pt had a shower, full linen change. Resting in bed comfortably, respirations even and unlabored. Daughter would like to be notified of any changes and/or behaviors exhibited by her mother.

## 2020-02-08 NOTE — ED Notes (Signed)
Pt stated she had to use the bathroom. Pt attempted to walk out of room. Pt was unable to be redirected to bathroom. Pt seemed to not comprehend bathroom was in room. Pt urinated on the floor. Pt cleaned and assisted back to bed.

## 2020-02-08 NOTE — ED Notes (Signed)
+  Pt attempting to get OOB and has Hx of falls. Yelling using much profanity screening for her mother . Very focused on leaving the department and staff having to block her efforts to leave. EDP Belfi asked for PRN ativan  Since it was to early to [place].

## 2020-02-09 DIAGNOSIS — F0391 Unspecified dementia with behavioral disturbance: Secondary | ICD-10-CM | POA: Diagnosis not present

## 2020-02-09 DIAGNOSIS — G309 Alzheimer's disease, unspecified: Secondary | ICD-10-CM | POA: Diagnosis not present

## 2020-02-09 MED ORDER — QUETIAPINE FUMARATE 50 MG PO TABS
50.0000 mg | ORAL_TABLET | Freq: Two times a day (BID) | ORAL | Status: DC
  Administered 2020-02-09 – 2020-02-18 (×16): 50 mg via ORAL
  Filled 2020-02-09 (×17): qty 1

## 2020-02-09 MED ORDER — HYDROXYZINE HCL 50 MG/ML IM SOLN
50.0000 mg | Freq: Two times a day (BID) | INTRAMUSCULAR | Status: DC | PRN
  Administered 2020-02-10 – 2020-02-27 (×6): 50 mg via INTRAMUSCULAR
  Filled 2020-02-09 (×7): qty 1

## 2020-02-09 MED ORDER — STERILE WATER FOR INJECTION IJ SOLN
INTRAMUSCULAR | Status: AC
  Administered 2020-02-09: 20:00:00 1.5 mL
  Filled 2020-02-09: qty 10

## 2020-02-09 MED ORDER — QUETIAPINE FUMARATE 100 MG PO TABS
100.0000 mg | ORAL_TABLET | Freq: Every day | ORAL | Status: DC
  Administered 2020-02-10 – 2020-02-17 (×8): 100 mg via ORAL
  Filled 2020-02-09 (×10): qty 1

## 2020-02-09 MED ORDER — HYDROXYZINE HCL 25 MG PO TABS
25.0000 mg | ORAL_TABLET | Freq: Two times a day (BID) | ORAL | Status: DC
  Administered 2020-02-09 – 2020-02-22 (×25): 25 mg via ORAL
  Filled 2020-02-09 (×27): qty 1

## 2020-02-09 NOTE — ED Notes (Signed)
Frequent screening and yelling at imaginary person in the room very profane will give PRN meds to de-escalate bx

## 2020-02-09 NOTE — ED Provider Notes (Signed)
Emergency Medicine Observation Re-evaluation Note  Sherry Hicks is a 59 y.o. female, seen on rounds today.  Pt initially presented to the ED for complaints of Altered Mental Status Currently, the patient is awaiting placement.  Per nursing there are significant issues with aggression.  It takes multiple staff to change the patient.  Patient bites, scratches and kicks.  She can walk independently but does not understand how to perform any ADLs including feeding.  She is eating and drinking.    Physical Exam  BP (!) 143/98 (BP Location: Right Arm)   Pulse 77   Temp 98.2 F (36.8 C) (Axillary)   Resp 18   SpO2 96%  Physical Exam General: disheveled, resting in the bed Cardiac: RRR Lungs: no respiratory distress Psych: appears to be have visual hallucinations  ED Course / MDM  EKG:EKG Interpretation  Date/Time:  Thursday February 05 2020 09:44:03 EST Ventricular Rate:  90 PR Interval:  146 QRS Duration: 74 QT Interval:  354 QTC Calculation: 433 R Axis:   74 Text Interpretation: Normal sinus rhythm Possible Left atrial enlargement Borderline ECG more wandering baseline than prior. No STEMI Confirmed by Theda Belfast ([id number]) on 02/06/2020 5:59:13 PM  Clinical Course as of 02/09/20 1021  Tue Jan 27, 2020  1621 Patient remained combative.  We do not have any EKG.  She is not allowing our staff to try an IV or get EKG.  We have given her IM Geodon followed by IM Haldol.  We were able to secure an IV and 1 mg IV Ativan given.  Labs pending at this time. Consult to social work and TTS to be placed soon.   The underlying cause is going to be behavioral and not medical. [AN]    Clinical Course User Index [AN] Derwood Kaplan, MD   I have reviewed the labs performed to date as well as medications administered while in observation.  Recent changes in the last 24 hours include none.  Plan  Current plan is for placement.  Will place Psychiatry consult for medication recommendations  due to ongoing agitation and aggressive behavior.   Patient is not under full IVC at this time.   Tilden Fossa, MD 02/09/20 1039

## 2020-02-09 NOTE — ED Notes (Signed)
Vital signs attempted but Sherry Hicks would not allow staff to obtain accurate measurements

## 2020-02-09 NOTE — Consult Note (Signed)
Psychiatric Consult   Sherry Hicks, 59 y.o., female with history of dementia with behavioral disturbances who was seen by this nurse practitioner. At this time patient was observed to be resting well, and she was not awaken. This nurse practitioner did speak with her nurse and daughter to obtain collateral.    Sherry Hicks  initially presented to [facility] ED with complaints of altered mental status.  Medication adjustments made and patient psychiatrically cleared on 01/29/2020 related to progression of dementia and physical decline patient better suited for SNF/memory care unit placement.      Per chart review she continues to yell out and very restless. Per daughter she has been exhibiting these behaviors at home, and she has tried multiple things to help reduce the behaviors for the past 15 months. She reports having 2 children at home and is concerned about her safety (patient), children safety and daughters safety. She states she is no longer comfortable with mother coming back into the home. She is requesting that she be placed in memory care unit.    Daughter states she has been able to educate herself on dementia and the progression of the disease.  Due to ongoing behaviors, level of agitation, and assistance required to manage this patient and ensure her safety, will continue to refer patient to Memory care unit. We also discussed the need for medications to help reduce these behaviors, as complete cessation of these behaviors are almost impossible.   Medication adjustments made to psychotropic medications: Will increase Seroquel 50 mg Bid (morning and evening dose) Increase Seroquel 100 mg Q hs.   Patient has been benefiting from Hydroxyzine 50mg  IM Q6, will scheduled Hydroxyzine oral at this time and continue to monitor. Hydroxyzine 25mg  po BID.  Last received Geodon 20mg  IM 12/25  -Goal is to continue to reduce the need for prn medications, and continue with stabilization through oral  medications when deemed safe for patient and others.   Recommendation:  Social work to continue seeking placement skilled nursing facility.    Disposition:   Social work to continue working with patient for SNF placement  , PMHNP-BC

## 2020-02-09 NOTE — ED Notes (Signed)
Daughter notified of mothers bx and informed pt will get geodon to help her regain self control

## 2020-02-09 NOTE — ED Notes (Signed)
Pt ate breakfast. Pt changed, resistive to personal care.

## 2020-02-09 NOTE — ED Notes (Signed)
Checked pt, dry. Asked if she needed to go to the bathroom, Pt stated no. Would not complete vitals.

## 2020-02-09 NOTE — TOC Progression Note (Signed)
Transition of Care [facility]) - Progression Note   Patient Details  Name: Sherry Hicks MRN: [id number] Date of Birth: May 02, 1960  [facility] CM/SW Contact  Ewing Schlein, LCSW Phone Number: 02/09/2020, 10:35 AM  Clinical Narrative: Patient does not have any memory care bed offers. Patient faxed out to [facility] and [facility] as these facilities also have memory care units. TOC awaiting bed offers.  Barriers to Discharge: SNF Pending bed offer  Readmission Risk Interventions No flowsheet data found.

## 2020-02-10 MED ORDER — LORAZEPAM 2 MG/ML IJ SOLN
2.0000 mg | Freq: Once | INTRAMUSCULAR | Status: AC
  Administered 2020-02-10: 18:00:00 2 mg via INTRAMUSCULAR
  Filled 2020-02-10: qty 1

## 2020-02-10 MED ORDER — STERILE WATER FOR INJECTION IJ SOLN
INTRAMUSCULAR | Status: AC
  Filled 2020-02-10: qty 10

## 2020-02-10 NOTE — ED Notes (Signed)
Pt agitated, disorganized, hallucinating seeing a baby, talking to someone not there, yelling out bad language, trying to get oob unassisted. Will not redirect.

## 2020-02-10 NOTE — ED Notes (Signed)
Pt began escalating again, yelling, screaming, jumping out of the bed creating extreme fall risk. Will not redirect and is causing neighboring patients to also escalate.

## 2020-02-10 NOTE — ED Notes (Signed)
After Geodon injection IM pt is alert, does not appear to be responding to internal stimuli, not talking to herself and is not agitated.

## 2020-02-10 NOTE — Progress Notes (Signed)
2nd shift ED CSW received a handoff from the 1st shift [facility] ED [facility] Team.  Per 1st shift TOC Team, patient faxed out to [facility] and [facility] as these facilities also have memory care units. TOC awaiting bed offers.  CSW will continue to follow for D/C needs.  Dorothe Pea. Md Smola  MSW, LCSW, LCAS, CCS Transitions of Care Clinical Social Worker Care Coordination Department Ph: [phone]

## 2020-02-10 NOTE — ED Notes (Signed)
Pt ate 100% breakfast.

## 2020-02-11 ENCOUNTER — Encounter (HOSPITAL_COMMUNITY): Payer: Self-pay | Admitting: Registered Nurse

## 2020-02-11 DIAGNOSIS — G309 Alzheimer's disease, unspecified: Secondary | ICD-10-CM | POA: Diagnosis not present

## 2020-02-11 MED ORDER — STERILE WATER FOR INJECTION IJ SOLN
INTRAMUSCULAR | Status: AC
  Administered 2020-02-11: 20:00:00 1.2 mL
  Filled 2020-02-11: qty 10

## 2020-02-11 NOTE — ED Provider Notes (Signed)
9:10 AM-I was contacted by social work, who states that the patient cannot be placed with her current behavioral issues.  She has been seen and psychiatrically cleared by TTS.  They made some medication changes, on 02/09/2020.  Seroquel was increased to the current dosing of 50 mg a.m. and 150 mg p.m.  She is also getting hydroxyzine every 6 hours as needed agitation.  Social worker requested that I consult psychiatry for additional input, regarding possible psychiatric hospitalization.  Nursing affirms that the patient, has continued to be agitated, she is currently sleeping and they requested I did not awaken her.  I will reconsult TTS.   Mancel Bale, MD 02/11/20 1007

## 2020-02-11 NOTE — Progress Notes (Addendum)
CSW received a call from disposition who is verbally stating pt is now psychiatrically cleared and appropriate a recommendation has been provided by psychiatry for placement into Memory Care  (SEE BELOW).  Per Psychiatry ON 12/29, "Social work should continue to seek Skilled nursing and/or memory care placement for this patient".  OF NOTE, PER CHART REVIEW PHYSICAL THERAPY HAS DISCHARGED PT FROM PHYSICAL THERAPY AS PT IS AMBULATORY AND THUS NOT APPROPRIATE FOR SKILLED NURSING FACILITY PLACEMENT (SEE BELOW).  PER THE PHYSICAL THERAPY NOTE FROM 12/24: Patient discharged from PT services secondary to patient does not require skilled PT intervention. Noted , patient is ambulatory.  4:16 PM Per the social work notes from 12/27, "Patient does not have any memory care bed offers. Patient faxed out to [facility] (has since declined) and [facility] as these facilities also have memory care units".  Today on 02/11/20 the CSW (this Clinical research associate) has sent pt's referral to 37 additional ALF providers via the hub (see below):  The following have pending referrals:  HUB-[place] [facility] ALF Details [facility] SNF Details Details [facility] Details [facility] Details [facility] Details [facility] Details [facility] Details H[facility] Details [facility] [place] [facility] Details [facility] Details [facility] Details H[facility] [place] [facility] Details [facility] [employer] [facility] Details [facility] [employer] [facility] Details [facility] Details [facility] [place] [facility] Details [facility] Details [facility] Details H[facility] Details [facility] Details [facility] Details [facility] [place]  [facility] Details [facility] DetailsDetails [facility] Details H[facility] Details [facility] Details [facility] Details [facility] Details H[facility] Details [facility] Details [facility] AL[facility] Details H[facility] Details [facility] Details Details [facility] Details [facility] Details H[facility] Details [facility] Details [facility] Details [facility] Details [facility]  Details [facility] Details [facility] Details H[facility] Details [facility] Details H[facility] [facility] Details Details [facility]F Details [facility] Details [facility]   CSW will continue to follow for D/C needs.  Dorothe Pea. Burna Atlas  MSW, LCSW, LCAS, CCS Transitions of Care Clinical Social Worker Care Coordination Department Ph: [phone]

## 2020-02-11 NOTE — ED Notes (Addendum)
Patient provided with a tray and was assisted with eating by sitter. Patient started to become more confused and agitated, and began calling out for her mother. Attempted to verbal redirect patient without success. When attempting to change patient's brief and linen, patient began striking out and screaming for "Jaquel" during changing. Patient attempting to get out of bed to find family. Attempted redirection multiple times without success.

## 2020-02-11 NOTE — Consult Note (Addendum)
Psychiatric Consult: 02/11/2020  Sherry Hicks, 22 yr.., female patient with history of dementia with behavioral disturbances. She initially presented to [facility] ED with complaints of altered mental status 01/27/2020.  Patient was seen by psychiatry medication adjustments made and patient psychiatrically cleared 01/29/2020.  Patient re-evaluated patient 02/09/2020 and more medication adjustments made.       Patient continues to have episodes of yelling out, restlessness, and behavioral issues.  Social work noting that they are unable to get patient into a skilled nursing or memory care facility related to her behaviors.  EDP requesting another psychiatric consult.     Laporchia A. Katrinka Blazing seen by this nurse practitioner via tele psych.  At this time patient is observed sleeping with bed slightly elevated.  Patient was awoken by nurse.  When asked how she was doing patient stated "I feel so good [id number]."  Patient the stated "Guess what they did today taking to the nurse but did not elaborate any further.  Patient adjusting the covers around her; then stating that she was thirsty and asked for water.  Patient was pleasant but not a good historian.  Unable to complete a psychiatric assessment related to patient capacity to answer questions.       Spoke to patient's nurse Beth, RN who reports that there have been no yelling or behavioral incidents with the patient today.  States patient has woken to eat her meals and the only time patient has yelled is when trying to perform personal care and may be a possibility that the patient is hurting.  States that the patient has been awake on and off but appears to be dopey which may be a result of the of the medication given or the Geodon that was given.  Nursing also reports that patient appears to be responding to less auditory hallucinations today.    Will continue to assess medication management for behaviors when needed.  This is a patient diagnosed with  Dementia with behavioral disturbances.  Medication can be used to assist with the reduction of behaviors but there will not be complete cessation of behaviors.  Also want to keep in mind that more is not always best for patient safety.  There has been some improvement since patients last medication adjustment.  Don't feel it is appropriated to make further changes at this time since there has been no incidents of behavioral issues today other that patient getting upset when personal care is done.  Patient is eating without difficulty, taking medications without adverse reaction other than appearing more drowsy today.          Recommendation: Medication adjustments as needed.  Social work should continue to seek Skilled nursing and/or memory care placement for this patient.     Medication adjustments made to psychotropic medications: Continue Seroquel 50 mg Bid (morning and evening dose) Continue Seroquel 100 mg Q hs.   Hydroxyzine 25 mg Bid  Will also continue the Hydroxyzine 50 mg IM prn Q 6 hr prn agitation since patient has benefited from it before (last given 02/10/20) and Geodon 20 mg Q 12 hr prn agitation (last given 02/10/20) Goal: still remains to reduce the need for prn medications and continue with stabilization through oral medications when deemed safe for patient and others.  No changes in medications at this time.  Since the last medication adjustment patient appears to be calmer but drowsy.  Patient will still benefit more with placement in SNF.  Social work to continue seeking placement.  Disposition:  Social Work to continue working with patient for NSF placement Patient does not meet criteria for psychiatric inpatient admission.  Secure message sent to Dr. Stevie Kern and Dr. Effie Shy (EDP), Ricquita Tarpley-Carter (social worker), Doylene Canning, and Dr. Lucianne Muss (psychiatrist) stating the following:  Patient was seen today by psychiatry. Patient appeared much calmer after last  medication adjustment, but she has been more drowsy.  Nursing reports there has been no behavioral outburst or yelling other than when personal care is performed.  Patient is eating without difficulty, allowing vital signs to be taken, and taking medications without difficulty or adverse reaction other than the increase drowsiness.  Don't feel that is appropriate to make any medication changes at this time as patient is doing better and calmer.  Patient still does not meet criteria for psychiatric hospitalization and social work should continue to seek SNF/memory care placement.      Quantae Martel B. Casmir Auguste, NP

## 2020-02-11 NOTE — ED Notes (Signed)
Pt screaming, agitated,  Difficult to redirect, talking to ppl not there. Uncooperative.

## 2020-02-11 NOTE — Progress Notes (Addendum)
TOC CM/CSW consulted with EDP in regards to pt.  EDP will consult with TTS about reevaluation and possible psych hospital admission.  Damiah Mcdonald Tarpley-Carter, MSW, LCSW-A Pronouns:  She, Her, Hers                  [facility] Transitions of CareClinical Social Worker [email or web address] [phone]

## 2020-02-12 ENCOUNTER — Other Ambulatory Visit: Payer: Self-pay

## 2020-02-12 DIAGNOSIS — G309 Alzheimer's disease, unspecified: Secondary | ICD-10-CM | POA: Diagnosis not present

## 2020-02-12 MED ORDER — STERILE WATER FOR INJECTION IJ SOLN
INTRAMUSCULAR | Status: AC
  Filled 2020-02-12: qty 10

## 2020-02-12 NOTE — ED Notes (Signed)
Pt able to take pills whole with nutrigrain bar. Pt usually takes meds with applesauce, but none was available at time of med administration.

## 2020-02-12 NOTE — Progress Notes (Signed)
TOC CM/CSW received a call from [facility] with a check in on pt.  CSW updated Chauncey on pts situation.  CSW will continue to follow for dc needs.  Jahlil Ziller Tarpley-Carter, MSW, LCSW-A Pronouns:  She, Her, Hers                  [facility] Transitions of CareClinical Social Worker [email or web address] [phone]

## 2020-02-12 NOTE — ED Notes (Signed)
Pt sleeping with sitter at bedside 

## 2020-02-12 NOTE — ED Notes (Signed)
Pt continues to talk and scream for people that are not present.  Pt difficult to redirect, requiring at least two people to safely assist back to bed.

## 2020-02-12 NOTE — ED Provider Notes (Signed)
Emergency Medicine Observation Re-evaluation Note  Sherry Hicks is a 59 y.o. female, seen on rounds today.  Pt initially presented to the ED for complaints of Altered Mental Status Currently, the patient is awaiting placement  Physical Exam  BP (!) 143/86 (BP Location: Right Arm)   Pulse 85   Temp 98.3 F (36.8 C) (Axillary)   Resp (!) 22   SpO2 94%  Physical Exam General: Sleeping soundly  ED Course / MDM  EKG:EKG Interpretation  Date/Time:  Thursday February 05 2020 09:44:03 EST Ventricular Rate:  90 PR Interval:  146 QRS Duration: 74 QT Interval:  354 QTC Calculation: 433 R Axis:   74 Text Interpretation: Normal sinus rhythm Possible Left atrial enlargement Borderline ECG more wandering baseline than prior. No STEMI Confirmed by Theda Belfast ([id number]) on 02/06/2020 5:59:13 PM  Clinical Course as of 02/12/20 0815  Tue Jan 27, 2020  1621 Patient remained combative.  We do not have any EKG.  She is not allowing our staff to try an IV or get EKG.  We have given her IM Geodon followed by IM Haldol.  We were able to secure an IV and 1 mg IV Ativan given.  Labs pending at this time. Consult to social work and TTS to be placed soon.   The underlying cause is going to be behavioral and not medical. [AN]    Clinical Course User Index [AN] Derwood Kaplan, MD   I have reviewed the labs performed to date as well as medications administered while in observation.  Recent changes in the last 24 hours include TTS reassessment for continued behavioral problems but cleared again for SW placement. Geodon given for agitation.   Plan  Current plan is for SNF placement. Patient is not under full IVC at this time.   Pollyann Savoy, MD 02/12/20 [phone]

## 2020-02-12 NOTE — ED Notes (Signed)
Patient awoke confused and difficult to redirect. Patient's night time medicine administered.

## 2020-02-12 NOTE — ED Notes (Signed)
RN entered room due to bed alarm sounding, pt found to be standing on the mattress at the head of the bed, screaming "daddy don't do that!"  Pt unable to be redirected, security to bedside.  Pt safely assisted to step down to floor, mattress noted to be soaked in urine, pts brief dangling off.  While NT changed sheets/linens, RN and security able to removed soiled brief.  Pt provided with wet rag soaked with no rinse cleanser. Pt initially wiped off her face, then proceeded to perform peri care independently.  Pt assisted into clean brief, clean scrub bottoms.  Staff (RN, NT, 1 Engineer, materials) had to [employer] handle pt to first sitting position on bed, then to [employer] position in bed.  4 rails up on bed for safety, bed alarm reset.  Pt provided with finger foods for dinner, able to feed self.

## 2020-02-12 NOTE — ED Notes (Signed)
Pt continuing to scream, jumping out of bed, unable to be redirected by staff.  Pt swatting at staff while staff attempting to clean feces off pt.  Pt again had to be STARR handled to get place back in bed, at which point pt was able to jerk leg and free a leg long enough to kick this RN in stomach.  Security to bedside.

## 2020-02-12 NOTE — ED Notes (Signed)
Charge RN and [facility] provider made aware of pts need for geodon sedation and escalating behaviors.

## 2020-02-13 DIAGNOSIS — G309 Alzheimer's disease, unspecified: Secondary | ICD-10-CM | POA: Diagnosis not present

## 2020-02-13 MED ORDER — LORAZEPAM 2 MG/ML IJ SOLN
2.0000 mg | Freq: Once | INTRAMUSCULAR | Status: AC
  Administered 2020-02-13: 2 mg via INTRAMUSCULAR
  Filled 2020-02-13: qty 1

## 2020-02-13 MED ORDER — ZIPRASIDONE MESYLATE 20 MG IM SOLR
20.0000 mg | Freq: Once | INTRAMUSCULAR | Status: AC
  Administered 2020-02-13: 20 mg via INTRAMUSCULAR
  Filled 2020-02-13: qty 20

## 2020-02-13 MED ORDER — STERILE WATER FOR INJECTION IJ SOLN
INTRAMUSCULAR | Status: AC
  Filled 2020-02-13: qty 10

## 2020-02-13 NOTE — ED Notes (Signed)
Pt continues to scream and be agitated.

## 2020-02-13 NOTE — ED Provider Notes (Signed)
Emergency Medicine Observation Re-evaluation Note  Sherry Hicks is a 59 y.o. female, seen on rounds today.  Pt initially presented to the ED for complaints of Altered Mental Status Currently, the patient is agitation  Physical Exam  BP 127/82 (BP Location: Right Arm)   Pulse 81   Temp 98.6 F (37 C) (Oral)   Resp 16   SpO2 100%  Physical Exam General: wdwn Cardiac: rrr Lungs: no distress Psych: agitated and uncooperative  ED Course / MDM  EKG:EKG Interpretation  Date/Time:  Thursday February 05 2020 09:44:03 EST Ventricular Rate:  90 PR Interval:  146 QRS Duration: 74 QT Interval:  354 QTC Calculation: 433 R Axis:   74 Text Interpretation: Normal sinus rhythm Possible Left atrial enlargement Borderline ECG more wandering baseline than prior. No STEMI Confirmed by Theda Belfast ([id number]) on 02/06/2020 5:59:13 PM  Clinical Course as of 02/13/20 1447  Tue Jan 27, 2020  1621 Patient remained combative.  We do not have any EKG.  She is not allowing our staff to try an IV or get EKG.  We have given her IM Geodon followed by IM Haldol.  We were able to secure an IV and 1 mg IV Ativan given.  Labs pending at this time. Consult to social work and TTS to be placed soon.   The underlying cause is going to be behavioral and not medical. [AN]    Clinical Course User Index [AN] Derwood Kaplan, MD   I have reviewed the labs performed to date as well as medications administered while in observation.  Recent changes in the last 24 hours include none  Plan  Current plan is for placement. Patient is under full IVC at this time.   Margarita Grizzle, MD 02/13/20 [phone]

## 2020-02-13 NOTE — ED Notes (Signed)
Pt agitated, grabbing, kicking, screaming, uncooperative with redirection, no able to to calm down. PRN Geodon 20 mg IM given

## 2020-02-13 NOTE — ED Notes (Signed)
Pt resting quietly in bed at this time.  Respirations even and unlabored.  NADN.  Will continue to monitor. 

## 2020-02-13 NOTE — ED Notes (Signed)
Received report from Beth, RN.  Assumed role as primary nurse at this time.   

## 2020-02-13 NOTE — ED Notes (Signed)
Pt alert, agitated, uncooperative with personal care, screaming, kicking, not able to redirected.

## 2020-02-14 DIAGNOSIS — G309 Alzheimer's disease, unspecified: Secondary | ICD-10-CM | POA: Diagnosis not present

## 2020-02-14 LAB — POC SARS CORONAVIRUS 2 AG -  ED: SARS Coronavirus 2 Ag: NEGATIVE

## 2020-02-14 MED ORDER — STERILE WATER FOR INJECTION IJ SOLN
INTRAMUSCULAR | Status: AC
  Administered 2020-02-14: 10 mL
  Filled 2020-02-14: qty 10

## 2020-02-14 NOTE — ED Notes (Signed)
Pt resting quietly in bed.  Respirations even and unlabored.  NADN.   

## 2020-02-14 NOTE — ED Notes (Signed)
Patient was given medication at 1800 to calm her down.She was agitated and delusional. RN has concerns of waking patient up at this time for VS. Will obtain VS once patient is alert. Patient is resting and no acute distress noted.

## 2020-02-14 NOTE — ED Notes (Signed)
Pt increasingly agitated, delusional and agitated.  Trying to get OOB with increasing fall risk.  Pt given IM medication.

## 2020-02-14 NOTE — ED Notes (Signed)
Pt was calm and cooperative until around 5 pm when she became agitated, delusional and unsettled.

## 2020-02-14 NOTE — ED Notes (Signed)
Patient is still resting from IM medication given to her earlier. Patient will be given night time medication when she is alert

## 2020-02-14 NOTE — ED Provider Notes (Signed)
Emergency Medicine Observation Re-evaluation Note  Sherry Hicks is a 60 y.o. female, seen on rounds today.  Pt initially presented to the ED for complaints of Altered Mental Status Currently, the patient is sitting comfortably in bed.  Physical Exam  BP 117/72   Pulse 68   Temp 98.1 F (36.7 C)   Resp 14   SpO2 100%  Physical Exam General: Well-appearing 60 year old female no acute distress and comfortably in bed. Cardiac:  Lungs: No increased effort of breathing Psych: Pleasantly demented  ED Course / MDM  EKG:EKG Interpretation  Date/Time:  Thursday February 05 2020 09:44:03 EST Ventricular Rate:  90 PR Interval:  146 QRS Duration: 74 QT Interval:  354 QTC Calculation: 433 R Axis:   74 Text Interpretation: Normal sinus rhythm Possible Left atrial enlargement Borderline ECG more wandering baseline than prior. No STEMI Confirmed by Theda Belfast ([id number]) on 02/06/2020 5:59:13 PM  Clinical Course as of 02/14/20 0912  Tue Jan 27, 2020  1621 Patient remained combative.  We do not have any EKG.  She is not allowing our staff to try an IV or get EKG.  We have given her IM Geodon followed by IM Haldol.  We were able to secure an IV and 1 mg IV Ativan given.  Labs pending at this time. Consult to social work and TTS to be placed soon.   The underlying cause is going to be behavioral and not medical. [AN]    Clinical Course User Index [AN] Derwood Kaplan, MD   I have reviewed the labs performed to date as well as medications administered while in observation.  Recent changes in the last 24 hours include some agitation yesterday which resolved she did receive Geodon yesterday evening.  Plan  Current plan is for placement. No complaints at this time.  Patient is under full IVC at this time.   Solon Augusta [place], [place] 02/14/20 0940    Margarita Grizzle, MD 02/14/20 1530

## 2020-02-15 MED ORDER — LORAZEPAM 2 MG/ML IJ SOLN
2.0000 mg | Freq: Once | INTRAMUSCULAR | Status: AC
  Administered 2020-02-15: 2 mg via INTRAMUSCULAR
  Filled 2020-02-15: qty 1

## 2020-02-15 MED ORDER — HALOPERIDOL LACTATE 5 MG/ML IJ SOLN
5.0000 mg | Freq: Once | INTRAMUSCULAR | Status: AC
  Administered 2020-02-15: 5 mg via INTRAMUSCULAR
  Filled 2020-02-15: qty 1

## 2020-02-15 MED ORDER — DIPHENHYDRAMINE HCL 50 MG/ML IJ SOLN
50.0000 mg | Freq: Once | INTRAMUSCULAR | Status: AC
  Administered 2020-02-15: 50 mg via INTRAMUSCULAR
  Filled 2020-02-15: qty 1

## 2020-02-15 MED ORDER — STERILE WATER FOR INJECTION IJ SOLN
INTRAMUSCULAR | Status: AC
  Administered 2020-02-15: 4.7 mL via INTRAMUSCULAR
  Filled 2020-02-15: qty 10

## 2020-02-15 NOTE — ED Notes (Signed)
Patient was becoming agitated and yelling loudly in room. Patient was also trying to get out of bed herself. MD notified of changes in patient's status. Order given for IM medications

## 2020-02-15 NOTE — ED Notes (Signed)
pt has been medicated d/t agitation. will continue to monitor.

## 2020-02-15 NOTE — ED Notes (Signed)
Pt resting comfortably, sleeping. Sitter at bedside.

## 2020-02-15 NOTE — ED Notes (Signed)
Initial contact with pt, pt is agitated and trying to get out of bed.

## 2020-02-15 NOTE — ED Notes (Signed)
Pt Is sleeping at bedside, sitter at bedside. Will continue to monitor.

## 2020-02-15 NOTE — ED Notes (Signed)
Pt sleeping, resting comfortably. Sitter at bedside.

## 2020-02-15 NOTE — ED Notes (Signed)
Pt alert. Calm at this time.

## 2020-02-15 NOTE — Progress Notes (Signed)
CSW reviewed HUB and there are no bed offers available for this patient.   TOC staff will continue to follow for discharge planning.  Slayton Lubitz, MSW, LCSW-A Transitions of Care  Clinical Social Worker I [facility]  Medical ICU [phone] 

## 2020-02-15 NOTE — ED Notes (Signed)
Patient is still resting from IM medication given to her earlier.Vital signs will be taken once patient is alert. Patient is resting and no acute distress noted

## 2020-02-16 DIAGNOSIS — G309 Alzheimer's disease, unspecified: Secondary | ICD-10-CM | POA: Diagnosis not present

## 2020-02-16 MED ORDER — STERILE WATER FOR INJECTION IJ SOLN
INTRAMUSCULAR | Status: AC
  Administered 2020-02-16: 10 mL
  Filled 2020-02-16: qty 10

## 2020-02-16 NOTE — ED Notes (Signed)
Pt continues to sleep. Will continue to monitor. Sitter  At bedside. 

## 2020-02-16 NOTE — ED Notes (Signed)
Pt has been calm and cooperative all morning.  She is normally calm and cooperative in the mornings.

## 2020-02-16 NOTE — ED Provider Notes (Signed)
Emergency Medicine Observation Re-evaluation Note  Sherry Hicks is a 60 y.o. female, seen on rounds today.  Pt initially presented to the ED for complaints of Altered Mental Status Currently, the patient is asleep.  Physical Exam  BP 116/74 (BP Location: Right Arm)   Pulse 69   Temp 98.3 F (36.8 C) (Axillary)   Resp 19   SpO2 98%  Physical Exam General: Asleep, comfortable Cardiac: Not evaluated  Lungs: Normal effort Psych: Asleep  ED Course / MDM  EKG:EKG Interpretation  Date/Time:  Thursday February 05 2020 09:44:03 EST Ventricular Rate:  90 PR Interval:  146 QRS Duration: 74 QT Interval:  354 QTC Calculation: 433 R Axis:   74 Text Interpretation: Normal sinus rhythm Possible Left atrial enlargement Borderline ECG more wandering baseline than prior. No STEMI Confirmed by Theda Belfast ([id number]) on 02/06/2020 5:59:13 PM  Clinical Course as of 02/16/20 0730  Tue Jan 27, 2020  1621 Patient remained combative.  We do not have any EKG.  She is not allowing our staff to try an IV or get EKG.  We have given her IM Geodon followed by IM Haldol.  We were able to secure an IV and 1 mg IV Ativan given.  Labs pending at this time. Consult to social work and TTS to be placed soon.   The underlying cause is going to be behavioral and not medical. [AN]    Clinical Course User Index [AN] Derwood Kaplan, MD   I have reviewed the labs performed to date as well as medications administered while in observation.  No acute events reported by staff.  Plan  Current plan is for placement . Patient is under full IVC at this time.   Wynetta Fines, MD 02/16/20 [phone]

## 2020-02-16 NOTE — ED Notes (Signed)
Pt has been medicated per MD orders, pt tolerated well. Sitter  Remains at bedside.

## 2020-02-17 DIAGNOSIS — G309 Alzheimer's disease, unspecified: Secondary | ICD-10-CM | POA: Diagnosis not present

## 2020-02-17 MED ORDER — LORAZEPAM 2 MG/ML IJ SOLN
2.0000 mg | Freq: Once | INTRAMUSCULAR | Status: AC
  Administered 2020-02-17: 2 mg via INTRAMUSCULAR

## 2020-02-17 NOTE — ED Notes (Signed)
Pt became extremely agitated and uncooperative, screaming and yelling and stating she needs to get her baby now. Pt leaving room and not returning without security assistance. Pt inconsolable and not redirectable. Geodon administered with security assistance

## 2020-02-17 NOTE — ED Notes (Signed)
Jaquin new updated contact phone number [phone]

## 2020-02-17 NOTE — ED Provider Notes (Signed)
Emergency Medicine Observation Re-evaluation Note  Sherry Hicks is a 60 y.o. female, seen on rounds today.  Pt initially presented to the ED for complaints of Altered Mental Status Currently, the patient is resting  Physical Exam  BP 96/68 (BP Location: Left Arm)   Pulse 64   Temp 97.6 F (36.4 C) (Axillary)   Resp 19   SpO2 100%  Physical Exam General: WDWN  Cardiac: RRR Lungs: Clear Psych: normal   ED Course / MDM  EKG:EKG Interpretation  Date/Time:  Thursday February 05 2020 09:44:03 EST Ventricular Rate:  90 PR Interval:  146 QRS Duration: 74 QT Interval:  354 QTC Calculation: 433 R Axis:   74 Text Interpretation: Normal sinus rhythm Possible Left atrial enlargement Borderline ECG more wandering baseline than prior. No STEMI Confirmed by Theda Belfast ([id number]) on 02/06/2020 5:59:13 PM  Clinical Course as of 02/17/20 0818  Tue Jan 27, 2020  1621 Patient remained combative.  We do not have any EKG.  She is not allowing our staff to try an IV or get EKG.  We have given her IM Geodon followed by IM Haldol.  We were able to secure an IV and 1 mg IV Ativan given.  Labs pending at this time. Consult to social work and TTS to be placed soon.   The underlying cause is going to be behavioral and not medical. [AN]    Clinical Course User Index [AN] Derwood Kaplan, MD   I have reviewed the labs performed to date as well as medications administered while in observation.  Recent changes in the last 24 hours include none  Plan  Current plan is for placement Patient is under full IVC at this time.   Elson Areas, [place] 02/17/20 8119    Bethann Berkshire, MD 02/19/20 1039

## 2020-02-17 NOTE — ED Notes (Signed)
Pt was agitated and combative and was unable to obtain vital signs. RN made aware.

## 2020-02-17 NOTE — ED Notes (Signed)
Patient very restless and unacooperative. No PRN meds available at this time. Restraints ordered. Restraints Applied. Will continue to monitor.

## 2020-02-18 DIAGNOSIS — G309 Alzheimer's disease, unspecified: Secondary | ICD-10-CM | POA: Diagnosis not present

## 2020-02-18 LAB — POC SARS CORONAVIRUS 2 AG -  ED: SARS Coronavirus 2 Ag: NEGATIVE

## 2020-02-18 MED ORDER — LORAZEPAM 1 MG PO TABS
1.0000 mg | ORAL_TABLET | Freq: Once | ORAL | Status: AC | PRN
  Administered 2020-02-19: 1 mg via ORAL
  Filled 2020-02-18: qty 1

## 2020-02-18 MED ORDER — QUETIAPINE FUMARATE 50 MG PO TABS
150.0000 mg | ORAL_TABLET | Freq: Every day | ORAL | Status: DC
  Administered 2020-02-19 – 2020-06-10 (×109): 150 mg via ORAL
  Filled 2020-02-18 (×112): qty 1

## 2020-02-18 MED ORDER — LORAZEPAM 2 MG/ML IJ SOLN
2.0000 mg | Freq: Once | INTRAMUSCULAR | Status: AC
  Administered 2020-02-18: 2 mg via INTRAMUSCULAR
  Filled 2020-02-18: qty 1

## 2020-02-18 MED ORDER — LORAZEPAM 2 MG/ML IJ SOLN: 1.0000 mg | Freq: Once | INTRAMUSCULAR | Status: AC | PRN

## 2020-02-18 MED ORDER — QUETIAPINE FUMARATE 100 MG PO TABS
100.0000 mg | ORAL_TABLET | Freq: Two times a day (BID) | ORAL | Status: DC
  Administered 2020-02-18 – 2020-06-10 (×220): 100 mg via ORAL
  Filled 2020-02-18 (×224): qty 1

## 2020-02-18 MED ORDER — QUETIAPINE FUMARATE 50 MG PO TABS
50.0000 mg | ORAL_TABLET | Freq: Two times a day (BID) | ORAL | Status: DC | PRN
  Administered 2020-02-19 – 2020-04-29 (×15): 50 mg via ORAL
  Filled 2020-02-18 (×3): qty 1
  Filled 2020-02-18: qty 2
  Filled 2020-02-18 (×18): qty 1

## 2020-02-18 NOTE — ED Provider Notes (Signed)
Emergency Medicine Observation Re-evaluation Note  Sherry Hicks is a 60 y.o. female, seen on rounds today.  Pt initially presented to the ED for complaints of Altered Mental Status Currently, the patient is pending placement.  Physical Exam  BP 113/73 (BP Location: Left Arm)   Pulse 67   Temp 97.7 F (36.5 C) (Axillary)   Resp 18   SpO2 92%  Physical Exam Patient sleeping comfortably in bed. ED Course / MDM  EKG:EKG Interpretation  Date/Time:  Thursday February 05 2020 09:44:03 EST Ventricular Rate:  90 PR Interval:  146 QRS Duration: 74 QT Interval:  354 QTC Calculation: 433 R Axis:   74 Text Interpretation: Normal sinus rhythm Possible Left atrial enlargement Borderline ECG more wandering baseline than prior. No STEMI Confirmed by Theda Belfast ([id number]) on 02/06/2020 5:59:13 PM  Clinical Course as of 02/18/20 0949  Tue Jan 27, 2020  1621 Patient remained combative.  We do not have any EKG.  She is not allowing our staff to try an IV or get EKG.  We have given her IM Geodon followed by IM Haldol.  We were able to secure an IV and 1 mg IV Ativan given.  Labs pending at this time. Consult to social work and TTS to be placed soon.   The underlying cause is going to be behavioral and not medical. [AN]    Clinical Course User Index [AN] Derwood Kaplan, MD   I have reviewed the labs performed to date as well as medications administered while in observation.  Recent changes in the last 24 hours include continues to be pending placement.  Plan  Current plan is for placemen.   Patient is become more agitated.  Not directable.  Will give Ativan.   Benjiman Core, MD 02/18/20 [phone]

## 2020-02-18 NOTE — Progress Notes (Signed)
TOC notes pt's continued treatment, reactions and continued medication management and will continue to reach out to and speak to ALF's in the area and has been in contact with [facility] that has connections with multiple locations/facilities under shared ownership and can be a great resource for the pt as their multiple facilities have openings which can appear daily and which could be a great option for the pt.  CSW Leadership continuing to remain in daily contact to monitor progess in regards to pt's disposition.  2nd shift ED CSW will leave handoff for 1st shift ED CSW.  CSW will continue to follow for D/C needs.  Dorothe Pea. Wiliam Cauthorn  MSW, LCSW, LCAS, CCS Transitions of Care Clinical Social Worker Care Coordination Department Ph: [phone]

## 2020-02-18 NOTE — ED Notes (Signed)
Starting at 1:15 PM, pt became increasingly agitated and continuously attempted to exit the bed. Pt was ambulated to the toilet twice but did not use the restroom. Two NTs and I could not redirect the pt or calm her down. She continuously attempted to exit the bed,  I administered ativan 2 mg at 13:40. An NT and I stayed with the patient, unsuccessfully attempting to redirect her until 14:15. An NT stayed at bedside, redirecting pt until 1425 when the pt stopped attempting to exit the bed.

## 2020-02-19 DIAGNOSIS — G309 Alzheimer's disease, unspecified: Secondary | ICD-10-CM | POA: Diagnosis not present

## 2020-02-19 MED ORDER — STERILE WATER FOR INJECTION IJ SOLN
INTRAMUSCULAR | Status: AC
  Filled 2020-02-19: qty 10

## 2020-02-19 NOTE — TOC Progression Note (Signed)
Transition of Care [facility]) - Progression Note    Patient Details  Name: Sherry Hicks MRN: [id number] Date of Birth: [birth year]  Transition of Care [facility]) CM/SW Contact  Halford Chessman Phone Number: 02/19/2020, 6:45 PM  Clinical Narrative:     CSW received phone call from [facility] and memory care.  They can not accept patient's younger then 7 and do not take Medicaid.  TOC team still working on placement for patient.   Barriers to Discharge: SNF Pending bed offer  Expected Discharge Plan and Services                                                 Social Determinants of Health (SDOH) Interventions    Readmission Risk Interventions No flowsheet data found.

## 2020-02-19 NOTE — ED Provider Notes (Signed)
Emergency Medicine Observation Re-evaluation Note  Sherry Hicks is a 60 y.o. female, seen on rounds today.  Pt initially presented to the ED for complaints of Altered Mental Status Currently, the patient is pending placement.  Physical Exam  BP 102/73   Pulse 70   Temp 97.8 F (36.6 C) (Oral)   Resp 14   SpO2 97%  Physical Exam General: Sitting up and resting comfortably Cardiac: No murmur Lungs: Clear Psych: Not agitated and no complaints  ED Course / MDM  EKG:EKG Interpretation  Date/Time:  Thursday February 05 2020 09:44:03 EST Ventricular Rate:  90 PR Interval:  146 QRS Duration: 74 QT Interval:  354 QTC Calculation: 433 R Axis:   74 Text Interpretation: Normal sinus rhythm Possible Left atrial enlargement Borderline ECG more wandering baseline than prior. No STEMI Confirmed by Theda Belfast ([id number]) on 02/06/2020 5:59:13 PM  Clinical Course as of 02/19/20 0730  Tue Jan 27, 2020  1621 Patient remained combative.  We do not have any EKG.  She is not allowing our staff to try an IV or get EKG.  We have given her IM Geodon followed by IM Haldol.  We were able to secure an IV and 1 mg IV Ativan given.  Labs pending at this time. Consult to social work and TTS to be placed soon.   The underlying cause is going to be behavioral and not medical. [AN]    Clinical Course User Index [AN] Derwood Kaplan, MD   I have reviewed the labs performed to date as well as medications administered while in observation.  Recent changes in the last 24 hours include none.  Plan  Current plan is for placement. Patient is under full IVC at this time.   Broderick Fonseca, Canary Brim, MD 02/19/20 [phone]

## 2020-02-19 NOTE — ED Notes (Signed)
Vital signs attempted unable to obtain temperature d/t pt refusal

## 2020-02-20 DIAGNOSIS — G309 Alzheimer's disease, unspecified: Secondary | ICD-10-CM | POA: Diagnosis not present

## 2020-02-20 NOTE — ED Notes (Signed)
TTS Consult 

## 2020-02-20 NOTE — ED Provider Notes (Signed)
Emergency Medicine Observation Re-evaluation Note  Sherry Hicks is a 60 y.o. female, seen on rounds today.  Pt initially presented to the ED for complaints of Altered Mental Status Currently, the patient is resting comfortably.  Physical Exam  BP 126/87 (BP Location: Right Arm)   Pulse 67   Temp 98.1 F (36.7 C) (Oral)   Resp 16   SpO2 100%  Physical Exam   ED Course / MDM  EKG:EKG Interpretation  Date/Time:  Thursday February 05 2020 09:44:03 EST Ventricular Rate:  90 PR Interval:  146 QRS Duration: 74 QT Interval:  354 QTC Calculation: 433 R Axis:   74 Text Interpretation: Normal sinus rhythm Possible Left atrial enlargement Borderline ECG more wandering baseline than prior. No STEMI Confirmed by Theda Belfast ([id number]) on 02/06/2020 5:59:13 PM  Clinical Course as of 02/20/20 0819  Tue Jan 27, 2020  1621 Patient remained combative.  We do not have any EKG.  She is not allowing our staff to try an IV or get EKG.  We have given her IM Geodon followed by IM Haldol.  We were able to secure an IV and 1 mg IV Ativan given.  Labs pending at this time. Consult to social work and TTS to be placed soon.   The underlying cause is going to be behavioral and not medical. [AN]    Clinical Course User Index [AN] Derwood Kaplan, MD   I have reviewed the labs performed to date as well as medications administered while in observation.  Recent changes in the last 24 hours include no acute changes .  Plan  Current plan is for awaiting placement.  Have requested psychiatry see the patient for medication management.. Patient is under full IVC at this time.   Lorre Nick, MD 02/20/20 [phone]

## 2020-02-20 NOTE — ED Notes (Signed)
Patient sleeping/rise and fall of chest breathing observed 

## 2020-02-20 NOTE — ED Notes (Signed)
Patients brief had to be changed 3 person assist

## 2020-02-20 NOTE — ED Notes (Signed)
Patient received lunch tray/ sitter assisted with feeding patient

## 2020-02-20 NOTE — ED Notes (Signed)
Took pillow and wrapped it in blanket so she could hold something like a baby. Seems to be helping moderately.

## 2020-02-20 NOTE — ED Notes (Signed)
Patient sleeping rise and fall of chest breathing observed  Breakfast tray being held till patient is awake

## 2020-02-20 NOTE — ED Notes (Addendum)
Patient awake sitting on bed

## 2020-02-20 NOTE — ED Notes (Signed)
Patient constantly trying to get out of bed (L)  (R) Seizure pads placed on bed for safety

## 2020-02-20 NOTE — Clinical Social Work Note (Signed)
TOC Supervisor received call from Berneice Heinrich with Psychiatry regarding patient medication adjustments.  Per documentation and conversation with department Director, will continue to monitor progression of her symptoms and assist with securing placement.  Patient current IVC and IV medications will continuously be a barrier to patient placement.  Patient with no bed offers at this time.  TOC team continuing to follow for placement needs.  Macario Golds, [place] [phone]

## 2020-02-20 NOTE — ED Notes (Signed)
Patients daughter at bedside Patient is engaging in conversation/ calm

## 2020-02-20 NOTE — ED Notes (Addendum)
Patient is awake sitter assisting with breakfast

## 2020-02-20 NOTE — ED Notes (Signed)
Patient asleep rise and fall of chest breathing observed 

## 2020-02-20 NOTE — ED Notes (Signed)
Patient received dinner tray sitter assisted with feeding patient

## 2020-02-20 NOTE — Progress Notes (Signed)
TOC CM spoke to pt's dtr, Claire Shown [phone], at bedside. Updated pt's dtr on her placement. Explained we are still looking for [facility] placement. States she would like a call from Psych to discuss pt's medications. Provided dtr with TOC CM/CSW contact information. States pt DSS Caseworker is Gentry Fitz, [phone]. Will continue to search for [facility] placement. Isidoro Donning RN CCM, [facility] ED TOC CM [phone]

## 2020-02-20 NOTE — ED Notes (Signed)
Brief changed Purple scrub pants/ top changed Bottom sheet changed  Warm blankets given to patient  Patient resting on bed (L) (R) seizure pads on for safety

## 2020-02-20 NOTE — ED Notes (Signed)
Patient asleep on bed rise and fall of chest breathing observed

## 2020-02-21 DIAGNOSIS — G309 Alzheimer's disease, unspecified: Secondary | ICD-10-CM | POA: Diagnosis not present

## 2020-02-21 MED ORDER — STERILE WATER FOR INJECTION IJ SOLN
INTRAMUSCULAR | Status: AC
  Administered 2020-02-21: 1.2 mL
  Filled 2020-02-21: qty 10

## 2020-02-21 NOTE — Progress Notes (Signed)
02/21/2020 1520  Patient was agitated from getting BP taken. Patient is yelling, kicking, trying to stand up in bed. Tried playing music and turned down the lights, but nothing is helping she continues to yell and fight.

## 2020-02-21 NOTE — ED Provider Notes (Signed)
Emergency Medicine Observation Re-evaluation Note  Sherry Hicks is a 61 y.o. female, seen on rounds today.  Pt initially presented to the ED for complaints of Altered Mental Status Currently, the patient is patient has been cooperative.  Taking her oral medications..  Physical Exam  BP 136/82 (BP Location: Left Arm)   Pulse 72   Temp 98.2 F (36.8 C) (Oral)   Resp 18   SpO2 94%  Physical Exam General: Resting in no acute distress   ED Course / MDM  EKG:EKG Interpretation  Date/Time:  Thursday February 05 2020 09:44:03 EST Ventricular Rate:  90 PR Interval:  146 QRS Duration: 74 QT Interval:  354 QTC Calculation: 433 R Axis:   74 Text Interpretation: Normal sinus rhythm Possible Left atrial enlargement Borderline ECG more wandering baseline than prior. No STEMI Confirmed by Theda Belfast ([id number]) on 02/06/2020 5:59:13 PM  Clinical Course as of 02/21/20 0837  Tue Jan 27, 2020  1621 Patient remained combative.  We do not have any EKG.  She is not allowing our staff to try an IV or get EKG.  We have given her IM Geodon followed by IM Haldol.  We were able to secure an IV and 1 mg IV Ativan given.  Labs pending at this time. Consult to social work and TTS to be placed soon.   The underlying cause is going to be behavioral and not medical. [AN]    Clinical Course User Index [AN] Derwood Kaplan, MD   I have reviewed the labs performed to date as well as medications administered while in observation.  Recent changes in the last 24 hours include request made to psychiatry to adjust medications if needed.  Per nursing record, patient has not required IV medications.  Social work note reviewed and states that patient been under IVC can be a barrier and this could be rescinded if facility has been found.  Request that TOC and [facility] continue to collaborate.  Plan  Current plan is for placement. Patient is under full IVC at this time.   Lorre Nick, MD 02/21/20 [phone]

## 2020-02-22 DIAGNOSIS — G309 Alzheimer's disease, unspecified: Secondary | ICD-10-CM | POA: Diagnosis not present

## 2020-02-22 MED ORDER — STERILE WATER FOR INJECTION IJ SOLN
INTRAMUSCULAR | Status: AC
  Filled 2020-02-22: qty 10

## 2020-02-22 MED ORDER — HYDROXYZINE HCL 25 MG PO TABS
25.0000 mg | ORAL_TABLET | Freq: Three times a day (TID) | ORAL | Status: DC
  Administered 2020-02-22 – 2020-04-05 (×121): 25 mg via ORAL
  Filled 2020-02-22 (×124): qty 1

## 2020-02-22 NOTE — ED Notes (Signed)
Pt screaming, kicking, uncooperative,can not be redirected.

## 2020-02-22 NOTE — Consult Note (Signed)
  Based on chart review, patient is currently receiving the following:   -Quietiapine 100mg  BID (0800, 1600); last dose given 1102  -Quietiapine 150mg  HS  -Quietiapine 50mg  2 times daily PRN (agitation)   -Hydroxyzine 25mg  2 times daily  -Hydroxyzine 50mg  q12hr prn (IM)   -Ziprasidone injection 20mg  q12hrs prn (IM)  EKG, labs (lipid panel, hgba1c) ordered. Patient reviewed with , NP. The following medication changes were made:   -Hydroxyzine 25mg  3 times daily  1521pm: Assigned nurse Beth, RN called to notify provider inability to collect ordered EKG, labs at this time. Staff to attempt at later time.

## 2020-02-22 NOTE — ED Notes (Signed)
Pt alert. Pt agitated. Screaming. Uncooperative with care. Confused. Nonsensical.

## 2020-02-22 NOTE — ED Notes (Signed)
Patient is currently sleeping. Patient was given Geodon earlier for aggressive behavior. RN will administer scheduled medication and obtain vital signs when patient awakes.

## 2020-02-22 NOTE — ED Notes (Signed)
Talked to TTS about see pt.  Not sure when assessment will be complete.

## 2020-02-22 NOTE — ED Provider Notes (Signed)
Patient resting comfortably at this time.  Still required medications.  Will reconsult TTS for medication management   Lorre Nick, MD 02/22/20 [phone]

## 2020-02-22 NOTE — ED Notes (Signed)
Pt yelling out.  Difficult to redirect. Pt reassured and given comfort.

## 2020-02-23 DIAGNOSIS — G309 Alzheimer's disease, unspecified: Secondary | ICD-10-CM | POA: Diagnosis not present

## 2020-02-23 MED ORDER — STERILE WATER FOR INJECTION IJ SOLN
INTRAMUSCULAR | Status: AC
  Administered 2020-02-23: 1.5 mL
  Filled 2020-02-23: qty 10

## 2020-02-23 MED ORDER — STERILE WATER FOR INJECTION IJ SOLN
INTRAMUSCULAR | Status: AC
  Filled 2020-02-23: qty 10

## 2020-02-23 MED ORDER — STERILE WATER FOR INJECTION IJ SOLN
INTRAMUSCULAR | Status: AC
  Administered 2020-02-23: 1.2 mL
  Filled 2020-02-23: qty 10

## 2020-02-23 NOTE — ED Notes (Signed)
Pt continues to be agitated, delusional, seeing and talking to people who are not there.  Gave additional PRN Seroquel.

## 2020-02-23 NOTE — ED Notes (Signed)
Pt has good appetite.  Continues to be delusional.

## 2020-02-23 NOTE — ED Notes (Addendum)
Jerene Bears RN Case manager [phone] with [facility] called to find out if we have a different contact number for pt's daughter.  She also said that pt qualifies for some more funding.  Danny Sprinkle informed of same through [employer].

## 2020-02-23 NOTE — ED Notes (Signed)
Pt was changed and was combative. As soon as she was changed she was calm, but delusional, talking to people who are not there.

## 2020-02-23 NOTE — ED Notes (Signed)
Pt refused nasal swab by slapping and struggling.  Also refused EKG in the same manner and blood draw that was ordered yesterday.

## 2020-02-23 NOTE — ED Notes (Signed)
PRN Seroquel did not help pt's extreme agitation. She has been standing up on the bed.  Oriented only to self.

## 2020-02-23 NOTE — ED Notes (Signed)
Patient awoke screaming, hallucinating, having conversations with herself. Pateint will not allow staff to clean her up. MD notified and okay with giving Geodon IM early.

## 2020-02-23 NOTE — ED Notes (Signed)
Sherry Hicks attempting to get OOB w/o staff assist not following redirection, aggressive toward staff who are encouraging her to stay in bed, bed alarm in place and alarming. Sherry. [place] is also screaming at the top of her lungs at imagery children and asking where is her baby. Verbal de-escalation ineffective staff attempting provide quiet time consoling her sitting at beside with pt, also ineffective

## 2020-02-23 NOTE — ED Provider Notes (Signed)
Emergency Medicine Observation Re-evaluation Note  Sherry Hicks is a 60 y.o. female, seen on rounds today.  Pt initially presented to the ED for complaints of Altered Mental Status Currently, the patient is resting comfortably.  Physical Exam  BP 131/88 (BP Location: Right Arm)   Pulse 93   Temp (!) 96.1 F (35.6 C) (Axillary)   Resp 20   SpO2 100%  Physical Exam General: Calm Cardiac: Normal heart rate Lungs: No respiratory distress Psych: Intermittently agitated  ED Course / MDM  EKG:EKG Interpretation  Date/Time:  Thursday February 05 2020 09:44:03 EST Ventricular Rate:  90 PR Interval:  146 QRS Duration: 74 QT Interval:  354 QTC Calculation: 433 R Axis:   74 Text Interpretation: Normal sinus rhythm Possible Left atrial enlargement Borderline ECG more wandering baseline than prior. No STEMI Confirmed by Theda Belfast ([id number]) on 02/06/2020 5:59:13 PM  Clinical Course as of 02/23/20 0915  Tue Jan 27, 2020  1621 Patient remained combative.  We do not have any EKG.  She is not allowing our staff to try an IV or get EKG.  We have given her IM Geodon followed by IM Haldol.  We were able to secure an IV and 1 mg IV Ativan given.  Labs pending at this time. Consult to social work and TTS to be placed soon.   The underlying cause is going to be behavioral and not medical. [AN]    Clinical Course User Index [AN] Derwood Kaplan, MD   I have reviewed the labs performed to date as well as medications administered while in observation.  Recent changes in the last 24 hours include ongoing IM sedation with Geodon required for control of patient..  Plan  Current plan is for placement in a group home.  IVC likely not valid at this time, will redo as needed.  COVID antigen update ordered.  We will asked psychiatry to adjust medications. Patient is not under full IVC at this time.   Mancel Bale, MD 02/23/20 [phone]

## 2020-02-24 MED ORDER — GABAPENTIN 100 MG PO CAPS
100.0000 mg | ORAL_CAPSULE | Freq: Two times a day (BID) | ORAL | Status: DC
  Administered 2020-02-24 – 2020-05-10 (×150): 100 mg via ORAL
  Filled 2020-02-24 (×152): qty 1

## 2020-02-24 MED ORDER — STERILE WATER FOR INJECTION IJ SOLN
INTRAMUSCULAR | Status: AC
  Administered 2020-02-24: 1.2 mL
  Filled 2020-02-24: qty 10

## 2020-02-24 NOTE — Consult Note (Signed)
  Psychiatric Medication/Chart Review   Sherry Hicks, 74 yr.., female patient with history of dementia with behavioral disturbances. She initially presented to [facility] ED with complaints of altered mental status 01/27/2020.  Patient was seen by psychiatry medication adjustments made and patient psychiatrically cleared on 01/29/2020.  Patient re-evaluated patient 02/09/2020, 02/11/2020, 02/22/2020 with medication adjustments made.  Patient chart and medication management will be reassessed again today 02/24/2020 by this provider and medication adjustments will be made.   Patient continues to have episodes of agitation, screaming/yelling out, and talking to people in her past (or present) that are not there (hallucinations).  Patient also continues to have episodes or refusing treatment such as labs/vitals.  As noted in previous assessments medication can be used to assist with the reduction of behaviors but there will not be complete cessation of behaviors. Labs reviewed: 02/24/2020 01/28/20 TSH: abnormal; but T3 and T4 normal.   There has been no urinalysis, ammonia.  RPR 07/09/19 normal.   Last EKG 02/05/20 QTc 433:  If want to continue to give psychotropic (antipsychotic) for agitation/behavior will need to follow up with an updated EKG to rule out QTc prolongation (cardiac issues) IM injections for agitation without monitoring heart can be dangerous to the patient.    Current Psychotropic Medications:  Will continue current medication:  Geodon IM with caution Vistaril 25 mg Tid Vistaril 25 mg IM Q 12 hr. prn itching Seroquel 100 mg Bid, 150 mg Q hs, and 50 mg Bid prn agitation Geodon 20 mg IM / Ativan 1 mg IM Q 12 hours prn agitation:  Has been given once or twice daily since patient admission to ED give or take a day or two.  Medication Changes:   Will start Gabapentin 100 mg Bid  Recommendations: Lipid panel, HgbA1c, Ammonia, B12, and EKG  Disposition:  Patient continues to be psychiatrically  cleared.  Social work to continue working with patient and family for skilled nursing/memory care placement.    Adilene Areola B. Chance Karam, NP

## 2020-02-24 NOTE — ED Provider Notes (Signed)
Emergency Medicine Observation Re-evaluation Note  Sherry Hicks is a 60 y.o. female, seen on rounds today.  Pt initially presented to the ED for complaints of Altered Mental Status Currently, the patient is boarding in the ED awaiting group home placement.  Physical Exam  BP 100/69 (BP Location: Right Arm)   Pulse 75   Temp 97.8 F (36.6 C) (Axillary)   Resp 18   SpO2 100%  Physical Exam General: Calm and cooperative.  Cardiac: Well perfused.  Lungs: Even, unlabored respirations.  Psych: Calm and cooperative.   ED Course / MDM  EKG:EKG Interpretation  Date/Time:  Thursday February 05 2020 09:44:03 EST Ventricular Rate:  90 PR Interval:  146 QRS Duration: 74 QT Interval:  354 QTC Calculation: 433 R Axis:   74 Text Interpretation: Normal sinus rhythm Possible Left atrial enlargement Borderline ECG more wandering baseline than prior. No STEMI Confirmed by Theda Belfast ([id number]) on 02/06/2020 5:59:13 PM  Clinical Course as of 02/24/20 0745  Tue Jan 27, 2020  1621 Patient remained combative.  We do not have any EKG.  She is not allowing our staff to try an IV or get EKG.  We have given her IM Geodon followed by IM Haldol.  We were able to secure an IV and 1 mg IV Ativan given.  Labs pending at this time. Consult to social work and TTS to be placed soon.   The underlying cause is going to be behavioral and not medical. [AN]    Clinical Course User Index [AN] Derwood Kaplan, MD   I have reviewed the labs performed to date as well as medications administered while in observation.  Recent changes in the last 24 hours include .  11:27 AM  Secure chat from TTS. Will order UA and EKG. Gabapentin ordered by [facility]. Patient remains psych clear and awaiting SNF/memory care.   Plan  Current plan is for COVID antigen and group home placement. Patient is not under full IVC at this time.   Maia Plan, MD 02/26/20 [phone]

## 2020-02-24 NOTE — Progress Notes (Signed)
02/24/2020  1540  Patient yelling, getting out of bed, refusing to go back to room. Patient fighting with herself talking to people that are not in the room.

## 2020-02-25 DIAGNOSIS — G309 Alzheimer's disease, unspecified: Secondary | ICD-10-CM | POA: Diagnosis not present

## 2020-02-25 MED ORDER — STERILE WATER FOR INJECTION IJ SOLN
INTRAMUSCULAR | Status: AC
  Administered 2020-02-25: 1 mL
  Filled 2020-02-25: qty 10

## 2020-02-25 NOTE — ED Provider Notes (Signed)
Emergency Medicine Observation Re-evaluation Note  Sherry Hicks is a 60 y.o. female, seen on rounds today.  Pt initially presented to the ED for complaints of Altered Mental Status Currently, the patient is resting in bed.  Physical Exam  BP 110/80 (BP Location: Right Arm)   Pulse 85   Temp (!) 97.4 F (36.3 C) (Axillary)   Resp 18   SpO2 97%  Physical Exam General: calm and cooperative Cardiac: warm and well perfused Lungs: even and unlabored Psych: calm for now  ED Course / MDM  EKG:EKG Interpretation  Date/Time:  Thursday February 05 2020 09:44:03 EST Ventricular Rate:  90 PR Interval:  146 QRS Duration: 74 QT Interval:  354 QTC Calculation: 433 R Axis:   74 Text Interpretation: Normal sinus rhythm Possible Left atrial enlargement Borderline ECG more wandering baseline than prior. No STEMI Confirmed by Theda Belfast ([id number]) on 02/06/2020 5:59:13 PM  Clinical Course as of 02/25/20 0851  Tue Jan 27, 2020  1621 Patient remained combative.  We do not have any EKG.  She is not allowing our staff to try an IV or get EKG.  We have given her IM Geodon followed by IM Haldol.  We were able to secure an IV and 1 mg IV Ativan given.  Labs pending at this time. Consult to social work and TTS to be placed soon.   The underlying cause is going to be behavioral and not medical. [AN]    Clinical Course User Index [AN] Derwood Kaplan, MD   I have reviewed the labs performed to date as well as medications administered while in observation.  Recent changes in the last 24 hours include agitated overnight requiring geodon.  Plan  Current plan is for SNF/memory care. Patient is not under full IVC at this time.   Milagros Loll, MD 02/25/20 [phone]

## 2020-02-25 NOTE — ED Notes (Signed)
Pt woke up. Pt changed. Pt uncooperative with personal care, pt kicking, yelling.   Pt needs assist with all care. Medication compliant this AM. Pt ate breakfast. Pt needs fed.

## 2020-02-25 NOTE — ED Notes (Signed)
Pt alert. Pt agitated at times. Nonsensical. Talking to ppl not there. Difficult to redirect

## 2020-02-25 NOTE — ED Notes (Signed)
Pt restless, agitated, difficult to redirect.

## 2020-02-25 NOTE — ED Notes (Signed)
Pt ate lunch. Pt was fed.  Pt drowsy.

## 2020-02-25 NOTE — ED Notes (Signed)
Pt screaming, kicking, can not be redirected during personal care.

## 2020-02-25 NOTE — ED Notes (Signed)
Pt screaming at the top of her lungs at imagery family members who are in her room . Verbal de-escalation and comfort measures ineffective  Pt medicated per order agitation with geodon 20mg  IM

## 2020-02-25 NOTE — ED Notes (Signed)
Pt asleep. Pt has been sleeping during shift.  Pt has not eaten breakfast.

## 2020-02-26 DIAGNOSIS — G309 Alzheimer's disease, unspecified: Secondary | ICD-10-CM | POA: Diagnosis not present

## 2020-02-26 MED ORDER — STERILE WATER FOR INJECTION IJ SOLN
INTRAMUSCULAR | Status: AC
  Filled 2020-02-26: qty 10

## 2020-02-26 NOTE — ED Notes (Signed)
Medication not effective. Yelling at staff, threatening staff. slamming doors.  Disrupting unit.

## 2020-02-26 NOTE — Progress Notes (Signed)
TOC CM/CSW was notified that Lasha Hickman/Marketing Director at [facility] is on her way to evaluate patient for [facility].  RN has been notified as well.  CSW will continue to follow for dc needs.  Alexia Dinger Tarpley-Carter, MSW, LCSW-A Pronouns:  She, Her, Hers                  [place] ED Transitions of CareClinical Social Worker [email or web address] [phone] 

## 2020-02-26 NOTE — ED Notes (Signed)
Pt confused, nonsensical.  Pt needs assist with ADLs. Pt uncooperative, resistive with care. Pt episodes of agitation and yelling out.

## 2020-02-26 NOTE — ED Notes (Signed)
EDP Sherry Hicks was made aware of daughters concern and states will look at wounds. No immediate medical need is noted at this time and wounds were cleaned with saline gauze and left open to air.

## 2020-02-26 NOTE — ED Provider Notes (Addendum)
Emergency Medicine Observation Re-evaluation Note  Sherry Hicks is a 60 y.o. female, seen on rounds today.  Pt initially presented to the ED for complaints of Altered Mental Status Currently, the patient is resting no acute distress.  Patient is waiting nursing home placement.  For dementia.  Patient is awaiting evaluation by [facility] for placement.  Physical Exam  BP 129/87 (BP Location: Right Arm)   Pulse 90   Temp 97.8 F (36.6 C) (Axillary)   Resp 18   SpO2 95%  Physical Exam General: Resting nontoxic no acute distress Cardiac: Regular rate and rhythm Lungs: Clear bilateral Psych: Currently resting  ED Course / MDM  EKG:EKG Interpretation  Date/Time:  Thursday February 05 2020 09:44:03 EST Ventricular Rate:  90 PR Interval:  146 QRS Duration: 74 QT Interval:  354 QTC Calculation: 433 R Axis:   74 Text Interpretation: Normal sinus rhythm Possible Left atrial enlargement Borderline ECG more wandering baseline than prior. No STEMI Confirmed by Theda Belfast ([id number]) on 02/06/2020 5:59:13 PM  Clinical Course as of 02/26/20 1519  Tue Jan 27, 2020  1621 Patient remained combative.  We do not have any EKG.  She is not allowing our staff to try an IV or get EKG.  We have given her IM Geodon followed by IM Haldol.  We were able to secure an IV and 1 mg IV Ativan given.  Labs pending at this time. Consult to social work and TTS to be placed soon.   The underlying cause is going to be behavioral and not medical. [AN]    Clinical Course User Index [AN] Derwood Kaplan, MD   I have reviewed the labs performed to date as well as medications administered while in observation.  Recent changes in the last 24 hours include none.  Plan  Current plan is for senior nursing facility placement. Patient is not under full IVC at this time.   [name], MD 02/26/20 1520    [name], MD 02/26/20 1525

## 2020-02-26 NOTE — ED Notes (Signed)
Daughter at bedside angry because the rule of cell phones not allowed is being enforced. Also expressed concern about bruise and scratches on legs. Left leg

## 2020-02-27 DIAGNOSIS — G309 Alzheimer's disease, unspecified: Secondary | ICD-10-CM | POA: Diagnosis not present

## 2020-02-27 NOTE — ED Notes (Signed)
Pt restless climbing out of bed, Vistaril 50 mg IM given at 2322. I will continue to monitor.

## 2020-02-27 NOTE — ED Notes (Signed)
Alert, talking to someone not there, not being verbally aggressive or loud.

## 2020-02-27 NOTE — ED Notes (Signed)
Pt responding to auditory/visual hallucinations. Pt telling hallucinations it's time to go. Pt able to be redirected at this time to stay.

## 2020-02-27 NOTE — ED Notes (Signed)
The pt alert to her self, unable to redirect at this time. I will continue to monitor.

## 2020-02-27 NOTE — ED Notes (Signed)
Pt was OOB to the bathroom but was incontinent urine and stool. Pericare performed by staff, clothing changed, bed changed.   Pt would not allow swab in nose for POC SARS Coronavirus recheck.

## 2020-02-27 NOTE — ED Notes (Signed)
Pt observed by this writer scratching legs with nails causing superficial scratches.

## 2020-02-27 NOTE — ED Provider Notes (Signed)
Emergency Medicine Observation Re-evaluation Note  Sherry Hicks is a 60 y.o. female, seen on rounds today.  Pt initially presented to the ED for complaints of Altered Mental Status Currently, the patient is awaiting placement.  Patient was to go to senior nursing facility no new updates today as far as placement goes.  Patient is remained stable.[name]  Physical Exam  BP 122/88 (BP Location: Right Arm)   Pulse 70   Temp 97.6 F (36.4 C) (Axillary)   Resp 16   SpO2 90%  Physical Exam General: NAD Cardiac: RRR Lungs: Clear Psych: Baseline  ED Course / MDM  EKG:EKG Interpretation  Date/Time:  Thursday February 05 2020 09:44:03 EST Ventricular Rate:  90 PR Interval:  146 QRS Duration: 74 QT Interval:  354 QTC Calculation: 433 R Axis:   74 Text Interpretation: Normal sinus rhythm Possible Left atrial enlargement Borderline ECG more wandering baseline than prior. No STEMI Confirmed by Theda Belfast ([id number]) on 02/06/2020 5:59:13 PM  Clinical Course as of 02/27/20 1629  Tue Jan 27, 2020  1621 Patient remained combative.  We do not have any EKG.  She is not allowing our staff to try an IV or get EKG.  We have given her IM Geodon followed by IM Haldol.  We were able to secure an IV and 1 mg IV Ativan given.  Labs pending at this time. Consult to social work and TTS to be placed soon.   The underlying cause is going to be behavioral and not medical. [AN]    Clinical Course User Index [AN] Sherry Kaplan, MD   I have reviewed the labs performed to date as well as medications administered while in observation.  Recent changes in the last 24 hours include no updates from Child psychotherapist regarding placement..  Plan  Current plan is for placement. Patient is under full IVC at this time.   Sherry Mulders, MD 02/27/20 1630

## 2020-02-27 NOTE — Care Management (Signed)
Multiple attempts to reach facility that visited patient on 1/13. No response at this time.

## 2020-02-28 DIAGNOSIS — G309 Alzheimer's disease, unspecified: Secondary | ICD-10-CM | POA: Diagnosis not present

## 2020-02-28 MED ORDER — STERILE WATER FOR INJECTION IJ SOLN
INTRAMUSCULAR | Status: AC
  Filled 2020-02-28: qty 10

## 2020-02-28 NOTE — ED Notes (Signed)
Pt hallucinating, yelling "Let's go!" Pt getting upset with visual hallucination. Pt screaming for her baby. Pt thinks her mother is taking her baby. Pt kicking at bed and screaming.

## 2020-02-28 NOTE — Progress Notes (Signed)
TOC CM/CSW attempted to contact LaSha Hickman/[facility] [phone].  LaSha assessed pt on 02/26/2020, CSW was attempting to follow up in regards to assessment outcome.  CSW left HIPPA compliant message with my contact information.  CSW will continue to follow for dc needs.  Marybell Robards Tarpley-Carter, MSW, LCSW-A Pronouns:  She, Her, Hers                  [place] ED Transitions of CareClinical Social Worker [email or web address] [phone] 

## 2020-02-28 NOTE — Progress Notes (Signed)
TOC CM/CSW contacted [address], [place] [place]  (memory care) [phone].  CSW left HIPPA compliant message with my contact information.  Please, follow up on Monday, 03/01/2020.  CSW will continue to follow for dc needs.  Eesa Justiss Tarpley-Carter, MSW, LCSW-A Pronouns:  She, Her, Hers                  [place] ED Transitions of CareClinical Social Worker [email or web address] [phone] 

## 2020-02-28 NOTE — ED Provider Notes (Signed)
Emergency Medicine Observation Re-evaluation Note  Sherry Hicks is a 60 y.o. female, seen on rounds today.  Pt initially presented to the ED for complaints of Altered Mental Status Currently, the patient is resting in bed.  Physical Exam  BP 98/82 (BP Location: Left Arm)   Pulse 63   Temp (!) 97.5 F (36.4 C) (Axillary)   Resp 16   SpO2 100%  Physical Exam General: calm at present Cardiac: warm and well perfused Lungs: even and unlabored Psych: calm presently but intermittently agitated  ED Course / MDM  EKG:EKG Interpretation  Date/Time:  Thursday February 05 2020 09:44:03 EST Ventricular Rate:  90 PR Interval:  146 QRS Duration: 74 QT Interval:  354 QTC Calculation: 433 R Axis:   74 Text Interpretation: Normal sinus rhythm Possible Left atrial enlargement Borderline ECG more wandering baseline than prior. No STEMI Confirmed by Theda Belfast ([id number]) on 02/06/2020 5:59:13 PM  Clinical Course as of 02/28/20 0854  Tue Jan 27, 2020  1621 Patient remained combative.  We do not have any EKG.  She is not allowing our staff to try an IV or get EKG.  We have given her IM Geodon followed by IM Haldol.  We were able to secure an IV and 1 mg IV Ativan given.  Labs pending at this time. Consult to social work and TTS to be placed soon.   The underlying cause is going to be behavioral and not medical. [AN]    Clinical Course User Index [AN] Derwood Kaplan, MD   I have reviewed the labs performed to date as well as medications administered while in observation.  Recent changes in the last 24 hours include agitation requiring IM meds overnight, .  Plan  Current plan is for placement per SW. Patient is not under full IVC at this time.   Milagros Loll, MD 02/28/20 [phone]

## 2020-02-28 NOTE — ED Notes (Signed)
Pt refused vitals RN notify pt combative,physically aggressive hitting,pushing,grabbing staff

## 2020-02-28 NOTE — Progress Notes (Signed)
TOC CM/CSW after attempting to contact Sherry Hicks/[facility] [phone], CSW attempted to contact Sherry Hicks/[facility] [phone].  Sherry was listed as next contact for [facility].   CSW left HIPPA compliant message with my contact information.  CSW will continue to follow for dc needs.  Sherry Hicks, MSW, LCSW-A Pronouns:  She, Her, Hers                  [place] ED Transitions of CareClinical Social Worker [email or web address] [phone] 

## 2020-02-28 NOTE — Progress Notes (Signed)
TOC CM/CSW contacted [facility] (memory care) [phone].  CSW spoke with Sherry Hicks in regards to admission.  Sherry Hicks stated that admission specialist were not in today and would return on Monday, 03/01/2020.  Admission specialists are Christian Lynn and Tracy Lortie.    Please, follow up on Monday, 03/01/2020.  CSW will continue to follow for dc needs.  Sherry Hicks, MSW, LCSW-A Pronouns:  She, Her, Hers                  [place] ED Transitions of CareClinical Social Worker [email or web address] [phone] 

## 2020-02-29 DIAGNOSIS — G309 Alzheimer's disease, unspecified: Secondary | ICD-10-CM | POA: Diagnosis not present

## 2020-02-29 MED ORDER — LORAZEPAM 2 MG/ML IJ SOLN
1.0000 mg | Freq: Once | INTRAMUSCULAR | Status: AC
  Administered 2020-02-29: 1 mg via INTRAMUSCULAR
  Filled 2020-02-29: qty 1

## 2020-02-29 NOTE — ED Notes (Signed)
Pt unable to be redirected, climbing out of bed, she has scratched her legs up and myself, keeps taking brief off, kicking, screaming and cussing

## 2020-02-29 NOTE — ED Notes (Signed)
PT AND LINEN CHANGED.

## 2020-02-29 NOTE — ED Provider Notes (Signed)
Patient who has been under involuntary commitment here, unclear if it is currently active, he is awaiting placement in a memory care unit for dementia.  She has required sedation because of combative behavior in the last 24 hours.  She is currently resting comfortably and not causing problems according to her nurse.  No anticipated disposition within the next 48 hours.   Mancel Bale, MD 02/29/20 1501

## 2020-03-01 DIAGNOSIS — G309 Alzheimer's disease, unspecified: Secondary | ICD-10-CM | POA: Diagnosis not present

## 2020-03-01 MED ORDER — STERILE WATER FOR INJECTION IJ SOLN
INTRAMUSCULAR | Status: AC
  Filled 2020-03-01: qty 10

## 2020-03-01 NOTE — ED Notes (Signed)
Patient medications being held until she wakes up, patient has had trouble sleeping last few days.

## 2020-03-01 NOTE — ED Notes (Signed)
Patient becoming agitated, trying to get out of bed, yelling at staff.

## 2020-03-01 NOTE — ED Provider Notes (Signed)
Emergency Medicine Observation Re-evaluation Note  Sherry Hicks is a 60 y.o. female, seen on rounds today.  Pt initially presented to the ED for complaints of Altered Mental Status Currently, the patient is Awaiting placement.  Physical Exam  BP 118/81 (BP Location: Left Arm)   Pulse 62   Temp 98.8 F (37.1 C)   Resp 18   SpO2 95%  Physical Exam General: NAD Cardiac: RRR Lungs: CTAB Psych: sleeping, resting comfortably  ED Course / MDM  EKG:EKG Interpretation  Date/Time:  Thursday February 05 2020 09:44:03 EST Ventricular Rate:  90 PR Interval:  146 QRS Duration: 74 QT Interval:  354 QTC Calculation: 433 R Axis:   74 Text Interpretation: Normal sinus rhythm Possible Left atrial enlargement Borderline ECG more wandering baseline than prior. No STEMI Confirmed by Theda Belfast ([id number]) on 02/06/2020 5:59:13 PM  Clinical Course as of 03/01/20 0935  Tue Jan 27, 2020  1621 Patient remained combative.  We do not have any EKG.  She is not allowing our staff to try an IV or get EKG.  We have given her IM Geodon followed by IM Haldol.  We were able to secure an IV and 1 mg IV Ativan given.  Labs pending at this time. Consult to social work and TTS to be placed soon.   The underlying cause is going to be behavioral and not medical. [AN]    Clinical Course User Index [AN] Derwood Kaplan, MD   I have reviewed the labs performed to date as well as medications administered while in observation.  Recent changes in the last 24 hours include none.  Plan  Current plan is for placement. Patient is not under full IVC at this time.   Melene Plan, DO 03/01/20 [phone]

## 2020-03-01 NOTE — ED Notes (Signed)
Report received from [place] RN 

## 2020-03-02 DIAGNOSIS — G309 Alzheimer's disease, unspecified: Secondary | ICD-10-CM | POA: Diagnosis not present

## 2020-03-02 LAB — POC SARS CORONAVIRUS 2 AG -  ED: SARS Coronavirus 2 Ag: NEGATIVE

## 2020-03-02 NOTE — ED Notes (Signed)
Patient was given Geodon at 1700 and has been asleep since. Medications and VS will be completed after patient is awake

## 2020-03-02 NOTE — ED Notes (Signed)
Pt alert this shift. Pt had episodes of agitation with personal care. Pt medication compliant. Pt ate all meals. Pt needs assist with ADLs

## 2020-03-02 NOTE — ED Notes (Signed)
Pt noted to be sleeping.  Respirations even and unlabored.  

## 2020-03-02 NOTE — ED Provider Notes (Signed)
Emergency Medicine Observation Re-evaluation Note  Sherry Hicks is a 60 y.o. female, seen on rounds today.  Pt initially presented to the ED for complaints of Altered Mental Status Currently, the patient is Awaiting placement.  Physical Exam  BP 101/61 (BP Location: Left Arm)   Pulse 69   Temp 97.8 F (36.6 C) (Axillary)   Resp 18   SpO2 99%  Physical Exam General: NAD Cardiac: RRR Lungs: CTAB Psych: sleeping, resting comfortably  ED Course / MDM  EKG:EKG Interpretation  Date/Time:  Thursday February 05 2020 09:44:03 EST Ventricular Rate:  90 PR Interval:  146 QRS Duration: 74 QT Interval:  354 QTC Calculation: 433 R Axis:   74 Text Interpretation: Normal sinus rhythm Possible Left atrial enlargement Borderline ECG more wandering baseline than prior. No STEMI Confirmed by Theda Belfast ([id number]) on 02/06/2020 5:59:13 PM  Clinical Course as of 03/02/20 0910  Tue Jan 27, 2020  1621 Patient remained combative.  We do not have any EKG.  She is not allowing our staff to try an IV or get EKG.  We have given her IM Geodon followed by IM Haldol.  We were able to secure an IV and 1 mg IV Ativan given.  Labs pending at this time. Consult to social work and TTS to be placed soon.   The underlying cause is going to be behavioral and not medical. [AN]    Clinical Course User Index [AN] Derwood Kaplan, MD   I have reviewed the labs performed to date as well as medications administered while in observation.  Recent changes in the last 24 hours include none.  Plan  Current plan is for placement. Patient is not under full IVC at this time.     Sabino Donovan, MD 03/02/20 [phone]

## 2020-03-03 DIAGNOSIS — G309 Alzheimer's disease, unspecified: Secondary | ICD-10-CM | POA: Diagnosis not present

## 2020-03-03 NOTE — ED Notes (Signed)
Disregard note at 14:26 regarding Departure Condition.

## 2020-03-03 NOTE — Progress Notes (Signed)
Patient calm and cooperative for majority of the shift.  She had one episode of physical aggression toward staff during pericare for diaper change.

## 2020-03-03 NOTE — ED Provider Notes (Signed)
Emergency Medicine Observation Re-evaluation Note  Sherry Hicks is a 60 y.o. female, seen on rounds today.  Pt initially presented to the ED for complaints of Altered Mental Status Currently, the patient is awaiting placement.  Patient is resting quietly in her bed.  She awakens to light voice.  Patient is pleasant.  Speech is tangential.  She reports she has been doing a lot of fun and interesting things.  Her mood is good and conversation is very positive at this moment.  No complaints.  Physical Exam  BP 110/72 (BP Location: Left Arm)   Pulse 89   Temp 98.3 F (36.8 C) (Oral)   Resp 17   SpO2 96%  Physical Exam General: Patient is thin and very deconditioned for age.  She is resting quietly in her bed.  She awakens to light voice.  No respiratory distress. Cardiac: Regular.  No gross rub murmur gallop. Lungs: Clear to auscultation no wheeze rhonchi rale. Psych: Patient's speech is tangential.  Her sentence formation is clear and appropriate. Musculoskeletal: No peripheral edema.  Calves are soft and nontender.  Patient has extensive muscular atrophy of all extremities.  ED Course / MDM  EKG:EKG Interpretation  Date/Time:  Thursday February 05 2020 09:44:03 EST Ventricular Rate:  90 PR Interval:  146 QRS Duration: 74 QT Interval:  354 QTC Calculation: 433 R Axis:   74 Text Interpretation: Normal sinus rhythm Possible Left atrial enlargement Borderline ECG more wandering baseline than prior. No STEMI Confirmed by Theda Belfast ([id number]) on 02/06/2020 5:59:13 PM  Clinical Course as of 03/03/20 0929  Tue Jan 27, 2020  1621 Patient remained combative.  We do not have any EKG.  She is not allowing our staff to try an IV or get EKG.  We have given her IM Geodon followed by IM Haldol.  We were able to secure an IV and 1 mg IV Ativan given.  Labs pending at this time. Consult to social work and TTS to be placed soon.   The underlying cause is going to be behavioral and not  medical. [AN]    Clinical Course User Index [AN] Derwood Kaplan, MD   I have reviewed the labs performed to date as well as medications administered while in observation.  Recent changes in the last 24 hours include none.  Plan  Current plan is for placement. Patient is under full IVC at this time.  Vital signs are stable.  CBC and basic chemistry is normal 5 days ago.  No indication for recheck at this time.   Arby Barrette, MD 03/03/20 [phone]

## 2020-03-03 NOTE — ED Notes (Signed)
Pt. Stated she was hungry. Provided pt with sandwich and applesauce and juice to drink.

## 2020-03-03 NOTE — ED Notes (Signed)
Pt ate 100% of dinner tray

## 2020-03-04 DIAGNOSIS — G309 Alzheimer's disease, unspecified: Secondary | ICD-10-CM | POA: Diagnosis not present

## 2020-03-04 MED ORDER — STERILE WATER FOR INJECTION IJ SOLN
INTRAMUSCULAR | Status: AC
  Administered 2020-03-04: 10 mL
  Filled 2020-03-04: qty 10

## 2020-03-04 NOTE — ED Notes (Signed)
Pt medication compliant this shift.

## 2020-03-04 NOTE — Progress Notes (Signed)
TOC CM/CSW reached out to [facility] [phone].  Elon Jester asked that CSW fax pts information to her for review.  Email:  [email or web address]  Fax:     [phone]  CSW will continue to follow for dc needs.  Lilianah Buffin Tarpley-Carter, MSW, LCSW-A Pronouns:  She, Her, Hers                  [facility] ED Transitions of CareClinical Social Worker [email or web address] [phone]

## 2020-03-04 NOTE — Progress Notes (Signed)
TOC CM/CSW attempted to follow up with the following placements:  Elon Jester [facility] [place], [place])  CSW left HIPPA compliant message with my contact information for the above placements.  CSW spoke with [facility] are holding on admits for the next two weeks.  CSW will continue to follow for dc needs.  Emmamae Mcnamara Tarpley-Carter, MSW, LCSW-A Pronouns:  She, Her, Hers                  [facility] ED Transitions of CareClinical Social Worker [email or web address] [phone]

## 2020-03-04 NOTE — ED Notes (Signed)
Pt ate breakfast. Pt sleeping, resting comfortably this shift.

## 2020-03-04 NOTE — ED Provider Notes (Signed)
Emergency Medicine Observation Re-evaluation Note  Sherry Hicks is a 60 y.o. female, seen on rounds today.  Pt initially presented to the ED for complaints of Altered Mental Status Currently, the patient is awaiting placement to a SNF.  Physical Exam  BP (!) 135/91 (BP Location: Right Arm)   Pulse 95   Temp 98.6 F (37 C) (Oral)   Resp 14   SpO2 98%  Physical Exam General: calm, sitting up in bed, NAD Cardiac: regular, no murmur Lungs: clear Psych: calm, cooperative  ED Course / MDM  EKG:EKG Interpretation  Date/Time:  Thursday February 05 2020 09:44:03 EST Ventricular Rate:  90 PR Interval:  146 QRS Duration: 74 QT Interval:  354 QTC Calculation: 433 R Axis:   74 Text Interpretation: Normal sinus rhythm Possible Left atrial enlargement Borderline ECG more wandering baseline than prior. No STEMI Confirmed by Theda Belfast ([id number]) on 02/06/2020 5:59:13 PM  Clinical Course as of 03/04/20 0834  Tue Jan 27, 2020  1621 Patient remained combative.  We do not have any EKG.  She is not allowing our staff to try an IV or get EKG.  We have given her IM Geodon followed by IM Haldol.  We were able to secure an IV and 1 mg IV Ativan given.  Labs pending at this time. Consult to social work and TTS to be placed soon.   The underlying cause is going to be behavioral and not medical. [AN]    Clinical Course User Index [AN] Derwood Kaplan, MD   I have reviewed the labs performed to date as well as medications administered while in observation.  Recent changes in the last 24 hours include none.  Plan  Current plan is for awaiting placement. Patient is not under full IVC at this time.   Rolan Bucco, MD 03/04/20 [phone]

## 2020-03-04 NOTE — Progress Notes (Signed)
TOC CM/CSW followed up with the following facilities for placement.  Some of these facilities are Private Pay and the others did not accept pt due to her behaviors or they don't accept her insurance.    [facility]  Pending - Request Sent  [facility]  Pending - Request Sent  [facility]  Pending - Request Sent  [facility]  Pending - Request Sent   [facility]  Pending - Request Sent  [facility]  Pending - Request Sent  [facility]  Pending - Request [facility]  Pending - Request Sent   CSW will continue to follow for dc needs.  Jakhiya Brower Tarpley-Carter, MSW, LCSW-A Pronouns:  She, Her, Hers                  [facility] ED Transitions of CareClinical Social Worker [email or web address] [phone]

## 2020-03-05 DIAGNOSIS — G309 Alzheimer's disease, unspecified: Secondary | ICD-10-CM | POA: Diagnosis not present

## 2020-03-05 NOTE — ED Notes (Signed)
At shift change patient was screaming over and over " lets go, lets go". Sherry Hicks was given her night times med which she was cooperative with and was offered something to drink.

## 2020-03-05 NOTE — ED Notes (Signed)
Continues to sit in bed, alert, calm and cooperative. Dry and does not need changing.

## 2020-03-05 NOTE — Progress Notes (Signed)
TOC CM/CSW pt was faxed out to the following.  [facility]  Pending - Request Sent  [facility]  Pending - Request Sent  [facility] [address] [facility]  Pending - Request Sent  [facility]  Pending - Request Sent  [facility]  Pending - Request Sent  [facility]  Pending - Request Sent   CSW will continue to follow up with these facilities for admission status.  CSW will continue to follow for dc needs.  Brigitta Pricer Tarpley-Carter, MSW, LCSW-A Pronouns:  She, Her, Hers                  [facility] ED Transitions of CareClinical Social Worker [email or web address] [phone]

## 2020-03-05 NOTE — ED Notes (Addendum)
This writer observed night shift cleaning/changing pt prior to the change of shift.  She protested loudly.

## 2020-03-05 NOTE — ED Notes (Signed)
Continues to sit up in bed quietly.

## 2020-03-05 NOTE — ED Provider Notes (Signed)
Emergency Medicine Observation Re-evaluation Note  Sherry Hicks is a 60 y.o. female, seen on rounds today.  Pt initially presented to the ED for complaints of Altered Mental Status Currently, the patient is awaiting placement to a SNF.  Physical Exam  BP 113/70    Pulse 64    Temp 97.9 F (36.6 C) (Oral)    Resp 20    SpO2 100%  Physical Exam General: Sitting up in bed, NAD Cardiac: regular, no murmur Lungs: clear Psych: calm, cooperative  ED Course / MDM  EKG:EKG Interpretation  Date/Time:  Thursday February 05 2020 09:44:03 EST Ventricular Rate:  90 PR Interval:  146 QRS Duration: 74 QT Interval:  354 QTC Calculation: 433 R Axis:   74 Text Interpretation: Normal sinus rhythm Possible Left atrial enlargement Borderline ECG more wandering baseline than prior. No STEMI Confirmed by Theda Belfast ([id number]) on 02/06/2020 5:59:13 PM  Clinical Course as of 03/05/20 0912  Tue Jan 27, 2020  1621 Patient remained combative.  We do not have any EKG.  She is not allowing our staff to try an IV or get EKG.  We have given her IM Geodon followed by IM Haldol.  We were able to secure an IV and 1 mg IV Ativan given.  Labs pending at this time. Consult to social work and TTS to be placed soon.   The underlying cause is going to be behavioral and not medical. [AN]    Clinical Course User Index [AN] Derwood Kaplan, MD   I have reviewed the labs performed to date as well as medications administered while in observation.  Recent changes in the last 24 hours include none.  Plan  Current plan is for awaiting placement. Patient is not under full IVC at this time.      Melene Plan, DO 03/05/20 [phone]

## 2020-03-05 NOTE — ED Notes (Signed)
Patient resting at this time.

## 2020-03-06 NOTE — ED Notes (Signed)
Pt alert this shift. Pt confused. Nonsensical. Rambling. Medication compliant. Resting this shift.

## 2020-03-06 NOTE — ED Notes (Signed)
Patient violently kicking and swinging at staff when we changed her brief. It took three people and patient was very hostile towards staff.

## 2020-03-07 DIAGNOSIS — G309 Alzheimer's disease, unspecified: Secondary | ICD-10-CM | POA: Diagnosis not present

## 2020-03-07 MED ORDER — ZIPRASIDONE MESYLATE 20 MG IM SOLR
INTRAMUSCULAR | Status: AC
  Filled 2020-03-07: qty 20

## 2020-03-07 MED ORDER — STERILE WATER FOR INJECTION IJ SOLN
INTRAMUSCULAR | Status: AC
  Filled 2020-03-07: qty 10

## 2020-03-07 NOTE — ED Provider Notes (Signed)
Emergency Medicine Observation Re-evaluation Note  Sherry Hicks is a 60 y.o. female, seen on rounds today.  Pt initially presented to the ED for complaints of behavioral symptoms. Patient is awaiting placement.   Physical Exam  BP 112/65 (BP Location: Right Arm)   Pulse 60   Temp 97.7 F (36.5 C) (Axillary)   Resp 16   SpO2 100%  Physical Exam General: resting Cardiac: regular rate Lungs: breathing comfortably Psych: resting quietly.  ED Course / MDM  EKG:EKG Interpretation  Date/Time:  Thursday February 05 2020 09:44:03 EST Ventricular Rate:  90 PR Interval:  146 QRS Duration: 74 QT Interval:  354 QTC Calculation: 433 R Axis:   74 Text Interpretation: Normal sinus rhythm Possible Left atrial enlargement Borderline ECG more wandering baseline than prior. No STEMI Confirmed by Theda Belfast ([id number]) on 02/06/2020 5:59:13 PM   I have reviewed the labs performed to date as well as medications administered while in observation.   Patient has not required any IM meds in past 48 hours - currently resting. TOC team working on placement.   Plan  TOC placement.   Cathren Laine, MD 03/07/20 [phone]

## 2020-03-07 NOTE — ED Notes (Signed)
Unable to get Vitals. Pt was very agitated & nurse was notified.

## 2020-03-08 DIAGNOSIS — G309 Alzheimer's disease, unspecified: Secondary | ICD-10-CM | POA: Diagnosis not present

## 2020-03-08 NOTE — ED Provider Notes (Signed)
Emergency Medicine Observation Re-evaluation Note  Sherry Hicks is a 60 y.o. female, seen on rounds today.  Pt initially presented to the ED for complaints of Altered Mental Status Currently, the patient is awaiting placement  Physical Exam  BP (!) 129/91 (BP Location: Right Arm)   Pulse 88   Temp 98 F (36.7 C) (Axillary)   Resp 18   SpO2 100%  Physical Exam General: Resting comfortably Cardiac: Regular rate Lungs: Breathing easily no stridor Psych: Flat affect  ED Course / MDM  EKG:EKG Interpretation  Date/Time:  Thursday February 05 2020 09:44:03 EST Ventricular Rate:  90 PR Interval:  146 QRS Duration: 74 QT Interval:  354 QTC Calculation: 433 R Axis:   74 Text Interpretation: Normal sinus rhythm Possible Left atrial enlargement Borderline ECG more wandering baseline than prior. No STEMI Confirmed by Theda Belfast ([id number]) on 02/06/2020 5:59:13 PM  I have reviewed the labs performed to date as well as medications administered while in observation.  Recent changes in the last 24 hours include intermittent agitation requiring redirected by nursing staff.  Plan  Current plan is for skilled nursing facility placement.  TOC team continues to make efforts    Linwood Dibbles, MD 03/08/20 1005

## 2020-03-08 NOTE — ED Notes (Signed)
Patient was very agitated, screaming, and uncooperative. Patient used verbal cues to help patient calm down.

## 2020-03-09 DIAGNOSIS — G309 Alzheimer's disease, unspecified: Secondary | ICD-10-CM | POA: Diagnosis not present

## 2020-03-09 MED ORDER — LIP MEDEX EX OINT
TOPICAL_OINTMENT | Freq: Once | CUTANEOUS | Status: AC
  Filled 2020-03-09: qty 7

## 2020-03-09 NOTE — Progress Notes (Signed)
TOC CM/CSW attempted to contact the following facilities:  [phone]/CSW left HIPPA compliant message with my contact information.  [phone]/CSW left HIPPA compliant message with my contact information.  [facility] [phone]) [phone]/CSW left HIPPA compliant message with my contact information.  [phone]/CSW left HIPPA compliant message with my contact information.  [facility] [phone] memory care  [facility] Abran Cantor [phone] memory care  CSW will continue to follow for dc needs.  Terese Heier Tarpley-Carter, MSW, LCSW-A Pronouns:  She, Her, Hers                  [facility] ED Transitions of CareClinical Social Worker [email or web address] [phone]

## 2020-03-09 NOTE — ED Provider Notes (Signed)
Emergency Medicine Observation Re-evaluation Note  Sherry Hicks is a 60 y.o. female, seen on rounds today.  Pt initially presented to the ED for complaints of Altered Mental Status Currently, the patient is  Calm and resting  Physical Exam  BP 108/70 (BP Location: Left Arm)   Pulse 70   Temp (P) 98.1 F (36.7 C) (Oral)   Resp 18   SpO2 100%  Physical Exam General: calm Cardiac: regular3 Lungs: easily breathing Psych: no acute agitation  ED Course / MDM  EKG:EKG Interpretation  Date/Time:  Thursday February 05 2020 09:44:03 EST Ventricular Rate:  90 PR Interval:  146 QRS Duration: 74 QT Interval:  354 QTC Calculation: 433 R Axis:   74 Text Interpretation: Normal sinus rhythm Possible Left atrial enlargement Borderline ECG more wandering baseline than prior. No STEMI Confirmed by Theda Belfast ([id number]) on 02/06/2020 5:59:13 PM  Clinical Course as of 03/09/20 0856  Tue Jan 27, 2020  1621 Patient remained combative.  We do not have any EKG.  She is not allowing our staff to try an IV or get EKG.  We have given her IM Geodon followed by IM Haldol.  We were able to secure an IV and 1 mg IV Ativan given.  Labs pending at this time. Consult to social work and TTS to be placed soon.   The underlying cause is going to be behavioral and not medical. [AN]    Clinical Course User Index [AN] Derwood Kaplan, MD   I have reviewed the labs performed to date as well as medications administered while in observation.  Recent changes in the last 24 hours include nothing.  Plan  Current plan is for toc placement.    Virgina Norfolk, DO 03/09/20 [phone]

## 2020-03-09 NOTE — Progress Notes (Signed)
Patient in room having loud, aggressive conversations with herself.  Patient unable to be redirected.  HS medications will be given.

## 2020-03-10 DIAGNOSIS — G309 Alzheimer's disease, unspecified: Secondary | ICD-10-CM | POA: Diagnosis not present

## 2020-03-10 NOTE — Progress Notes (Signed)
TOC CM/CSW attempted to contact the following facilities:  [phone]/Closed for renovations.  [phone]/Private Pay  [phone]/Private Pay  [phone]/Private Pay  CSW will continue to follow for dc needs.  Daianna Vasques Tarpley-Carter, MSW, LCSW-A Pronouns:  She, Her, Hers                  [facility] ED Transitions of CareClinical Social Worker [email or web address] [phone] 

## 2020-03-10 NOTE — ED Provider Notes (Signed)
Emergency Medicine Observation Re-evaluation Note  Sherry Hicks is a 61 y.o. female, seen on rounds today.  Pt initially presented to the ED for complaints of Altered Mental Status Currently, the patient is resting comfortably.  Physical Exam  BP 107/77 (BP Location: Left Arm)   Pulse 67   Temp 97.8 F (36.6 C) (Axillary)   Resp 17   SpO2 98%  Physical Exam General: Nontoxic appearance Cardiac: Normal heart rate Lungs: Normal respiratory rate Psych: Delusional  ED Course / MDM  EKG:EKG Interpretation  Date/Time:  Thursday February 05 2020 09:44:03 EST Ventricular Rate:  90 PR Interval:  146 QRS Duration: 74 QT Interval:  354 QTC Calculation: 433 R Axis:   74 Text Interpretation: Normal sinus rhythm Possible Left atrial enlargement Borderline ECG more wandering baseline than prior. No STEMI Confirmed by Theda Belfast ([id number]) on 02/06/2020 5:59:13 PM  Clinical Course as of 03/10/20 1049  Tue Jan 27, 2020  1621 Patient remained combative.  We do not have any EKG.  She is not allowing our staff to try an IV or get EKG.  We have given her IM Geodon followed by IM Haldol.  We were able to secure an IV and 1 mg IV Ativan given.  Labs pending at this time. Consult to social work and TTS to be placed soon.   The underlying cause is going to be behavioral and not medical. [AN]    Clinical Course User Index [AN] Derwood Kaplan, MD   I have reviewed the labs performed to date as well as medications administered while in observation.  Recent changes in the last 24 hours include she remains stable awaiting placement.  Plan  Current plan is for placement in a skilled nursing facility. Patient is not under full IVC at this time.   Mancel Bale, MD 03/10/20 1050

## 2020-03-11 DIAGNOSIS — G309 Alzheimer's disease, unspecified: Secondary | ICD-10-CM | POA: Diagnosis not present

## 2020-03-11 NOTE — Progress Notes (Signed)
TOC CM/CSW faxed pt out to the following facilities:  [facility]  CSW will continue to follow for dc needs.  Charrisse Masley Tarpley-Carter, MSW, LCSW-A Pronouns:  She, Her, Hers                  [facility] ED Transitions of CareClinical Social Worker [email or web address] [phone] 

## 2020-03-11 NOTE — ED Notes (Signed)
Pt has been alert this shift, Napping at times. Pt confused, nonsensical. Medication compliant this shift. Pt ate all meals. Pt needs assist with ADLs.

## 2020-03-11 NOTE — ED Provider Notes (Signed)
Emergency Medicine Observation Re-evaluation Note  Sherry Hicks is a 60 y.o. female, seen on rounds today.  Pt initially presented to the ED for complaints of Altered Mental Status Currently, the patient is resting comfortably.  Physical Exam  BP 101/69 (BP Location: Left Arm)   Pulse 68   Temp (!) 97.1 F (36.2 C) (Axillary)   Resp 16   SpO2 100%  Physical Exam General: No acute distress   ED Course / MDM  EKG:EKG Interpretation  Date/Time:  Thursday February 05 2020 09:44:03 EST Ventricular Rate:  90 PR Interval:  146 QRS Duration: 74 QT Interval:  354 QTC Calculation: 433 R Axis:   74 Text Interpretation: Normal sinus rhythm Possible Left atrial enlargement Borderline ECG more wandering baseline than prior. No STEMI Confirmed by Theda Belfast ([id number]) on 02/06/2020 5:59:13 PM   I have reviewed the labs performed to date as well as medications administered while in observation.  Recent changes in the last 24 hours include cooperative.  Plan  Current plan is for placement. Patient is under full IVC at this time.   Lorre Nick, MD 03/11/20 [phone]

## 2020-03-12 DIAGNOSIS — G309 Alzheimer's disease, unspecified: Secondary | ICD-10-CM | POA: Diagnosis not present

## 2020-03-12 NOTE — Progress Notes (Signed)
TOC CM/CSW followed up on the following facilities in which has been faxed pts information. They have declined pt.   [facility]  Pending - Request Sent  [facility]  Pending - Request Sent  [facility]  Pending - Request Sent  [facility]  Pending - Request Sent  [facility]  Pending - Request Sent  [facility]  Pending - Request Sent   CSW will continue to follow for dc needs.  Reinhard Schack Tarpley-Carter, MSW, LCSW-A Pronouns:  She, Her, Hers                  [facility] ED Transitions of CareClinical Social Worker [email or web address] [phone]

## 2020-03-12 NOTE — ED Provider Notes (Signed)
Emergency Medicine Observation Re-evaluation Note  Sherry Hicks is a 60 y.o. female, seen on rounds today.  Pt initially presented to the ED for complaints of Altered Mental Status Currently, the patient is sleeping.  Physical Exam  BP 98/62 (BP Location: Left Arm)   Pulse 64   Temp 98.2 F (36.8 C) (Oral)   Resp 18   SpO2 97%  Physical Exam General: Resting Cardiac: Regular rate Lungs: Breathing easily Psych: Sleeping  ED Course / MDM  EKG:EKG Interpretation  Date/Time:  Thursday February 05 2020 09:44:03 EST Ventricular Rate:  90 PR Interval:  146 QRS Duration: 74 QT Interval:  354 QTC Calculation: 433 R Axis:   74 Text Interpretation: Normal sinus rhythm Possible Left atrial enlargement Borderline ECG more wandering baseline than prior. No STEMI Confirmed by Theda Belfast ([id number]) on 02/06/2020 5:59:13 PM   I have reviewed the labs performed to date as well as medications administered while in observation.  Recent changes in the last 24 hours include compliant with meds during shift.  Plan  Current plan is for Placement Patient is under full IVC at this time.   Linwood Dibbles, MD 03/12/20 [phone]

## 2020-03-12 NOTE — ED Notes (Signed)
Pt calm, cooperative and pleasant. Oriented to self only.  Good appetite.

## 2020-03-12 NOTE — ED Notes (Signed)
Pt cleaned and changed, linens changed. Accomplished most smoothly by getting her OOB and to the bathroom. Pt's memory impaired moment to moment.

## 2020-03-13 DIAGNOSIS — G309 Alzheimer's disease, unspecified: Secondary | ICD-10-CM | POA: Diagnosis not present

## 2020-03-13 NOTE — ED Provider Notes (Signed)
Emergency Medicine Observation Re-evaluation Note  Sherry Hicks is a 60 y.o. female, seen on rounds today.  Pt initially presented to the ED for complaints of Altered Mental Status .  Physical Exam  BP 120/87 (BP Location: Right Arm)   Pulse (!) 54   Temp 98.3 F (36.8 C) (Oral)   Resp 16   SpO2 100%  Physical Exam General: Calm, sleeping Cardiac: Regular rate Lungs: Breathing easily, no stridor   ED Course / MDM  EKG:EKG Interpretation  Date/Time:  Thursday February 05 2020 09:44:03 EST Ventricular Rate:  90 PR Interval:  146 QRS Duration: 74 QT Interval:  354 QTC Calculation: 433 R Axis:   74 Text Interpretation: Normal sinus rhythm Possible Left atrial enlargement Borderline ECG more wandering baseline than prior. No STEMI Confirmed by Theda Belfast ([id number]) on 02/06/2020 5:59:13 PM  I have reviewed the labs performed to date as well as medications administered while in observation.  Recent changes in the last 24 hours include continued efforts by [facility] team to find placement.  Plan  Current plan is for SNF placement.    Linwood Dibbles, MD 03/13/20 [phone]

## 2020-03-13 NOTE — ED Notes (Signed)
Peri care provided to patient. She was also given a sandwich and ensure to drink.

## 2020-03-14 DIAGNOSIS — G309 Alzheimer's disease, unspecified: Secondary | ICD-10-CM | POA: Diagnosis not present

## 2020-03-14 NOTE — ED Notes (Signed)
Patient awake, did each lunch and drink ensure. Having conversation with self.

## 2020-03-14 NOTE — ED Provider Notes (Signed)
Emergency Medicine Observation Re-evaluation Note  Sherry Hicks is a 60 y.o. female, seen on rounds today.  Pt initially presented to the ED for complaints of Altered Mental Status Patient is currently sleeping this morning  Physical Exam  BP (!) 157/91 (BP Location: Right Arm)   Pulse (!) 57   Temp 98.7 F (37.1 C) (Axillary)   Resp 18   SpO2 100%  Physical Exam General: Calm, sleeping Cardiac: Regular rate Lungs: Breathing easily  ED Course / MDM  EKG:EKG Interpretation  Date/Time:  Thursday February 05 2020 09:44:03 EST Ventricular Rate:  90 PR Interval:  146 QRS Duration: 74 QT Interval:  354 QTC Calculation: 433 R Axis:   74 Text Interpretation: Normal sinus rhythm Possible Left atrial enlargement Borderline ECG more wandering baseline than prior. No STEMI Confirmed by Theda Belfast ([id number]) on 02/06/2020 5:59:13 PM  I have reviewed the labs performed to date as well as medications administered while in observation.  Recent changes in the last 24 hours include episodes of talking to herself but has been cooperative and pleasant with staff.  Plan  Current plan is for placement.    Linwood Dibbles, MD 03/14/20 [phone]

## 2020-03-14 NOTE — ED Notes (Signed)
Patient had multiple episodes of irritability and agitation while conversing alone, however upon approach by staff she immediately brightened and became pleasant.

## 2020-03-14 NOTE — ED Notes (Signed)
Pericare provided 

## 2020-03-15 DIAGNOSIS — G309 Alzheimer's disease, unspecified: Secondary | ICD-10-CM | POA: Diagnosis not present

## 2020-03-15 NOTE — Progress Notes (Signed)
TOC CM contacted pt's dtr, Claire Shown to discuss dc to home, and legal guardianship rights. Dtr wanted to discuss case with Psych about why pt is not appropriate for placement at a psych facility. TOC CM/CSW have continued search for placement at SNF, and Memory Care. Pt has received no bed offers at this time. Isidoro Donning RN CCM, [facility] ED TOC CM [phone]

## 2020-03-15 NOTE — ED Notes (Signed)
Pt has been alert this shift. Pt confused. Pt has been cooperative with care, with slight resistance, redirectable. Medication compliant. Pt needs assist with ADLs.

## 2020-03-15 NOTE — ED Provider Notes (Signed)
Emergency Medicine Observation Re-evaluation Note  Sherry Hicks is a 60 y.o. female, seen on rounds today.  Pt initially presented to the ED for complaints of Altered Mental Status Currently, the patient is awaiting SNF placement. Asleep and comfortable this morning, states she feels a little better today.  Physical Exam  BP 111/87 (BP Location: Right Arm)   Pulse 89   Temp (!) 97.5 F (36.4 C) (Axillary)   Resp 20   SpO2 100%  Physical Exam General: sleeping, comfortable Pulm: normal WOB NEuro: oriented x 3, fluent speech Psych: calm, cooperative  ED Course / MDM  EKG:EKG Interpretation  Date/Time:  Thursday February 05 2020 09:44:03 EST Ventricular Rate:  90 PR Interval:  146 QRS Duration: 74 QT Interval:  354 QTC Calculation: 433 R Axis:   74 Text Interpretation: Normal sinus rhythm Possible Left atrial enlargement Borderline ECG more wandering baseline than prior. No STEMI Confirmed by Theda Belfast ([id number]) on 02/06/2020 5:59:13 PM   I have reviewed the labs performed to date as well as medications administered while in observation.  Recent changes in the last 24 hours include no acute events overnight.  Plan  Current plan is for SNF placement.    Little, Ambrose Finland, MD 03/15/20 [phone]

## 2020-03-16 NOTE — ED Notes (Signed)
Patient in bed at this time, talking to sitter.

## 2020-03-16 NOTE — TOC Progression Note (Signed)
Transition of Care [facility]) - Progression Note    Patient Details  Name: NAAVA JANEWAY MRN: [id number] Date of Birth: [birth year]  [facility] CM/SW Contact  Theda Belfast Era Skeen, RN Phone Number: 03/16/2020, 4:41 PM  Clinical Narrative:    This TOC supervisor spoke with patients daughter who voiced frustration because she is concerned for her mother safety if she returns home. She does not feel her home is prepared to receive her mother. She still prefers her mother be placed for psychiatric stabilization. TOC has requested psych speak with patients daughter to better explain reasoning behind patient being psychiatrically cleared and plans/options for long term management. TOC has also reached out to DSS to determine their prior evolvement , discuss planning for patients DC back to the community and placement options from home. Daughter understands we do not expect patient to be picked up until these two things happen but also that facility placement from the hospital is no longer an option as we have been unsuccessful thus far and patient has now stabilized significantly and that unless there is an unforseen event the plan will be for the daughter to take her back into her care. TOC will continue to follow and work on disposition planning for DC home with daughter.

## 2020-03-16 NOTE — Progress Notes (Signed)
TOC CM/CSW attempted to contact [name]/pts daughter [phone].  CSW left HIPPA compliant message with my contact information.  CSW will continue to follow for dc needs.  Caris Cerveny Tarpley-Carter, MSW, LCSW-A Pronouns:  She, Her, Hers                  [facility] ED Transitions of CareClinical Social Worker [email or web address] [phone] 

## 2020-03-16 NOTE — Progress Notes (Signed)
TOC CM/CSW spoke with pts previous DSS case worker/Chauncey Greene [phone].  Donovan Kail stated he would speak with pts daughter/[name].  CSW will continue to follow for dc needs.  Inetha Maret Tarpley-Carter, MSW, LCSW-A Pronouns:  She, Her, Hers                  [facility] ED Transitions of CareClinical Social Worker [email or web address] [phone]

## 2020-03-16 NOTE — Progress Notes (Signed)
TOC CM/CSW attempted to contact Sherry Hicks/pts daughter [phone].  CSW left HIPPA compliant message with my contact information.  CSW will continue to follow for dc needs.  Sherry Hicks, MSW, LCSW-A Pronouns:  She, Her, Hers                  [facility] ED Transitions of CareClinical Social Worker [email or web address] [phone]

## 2020-03-17 DIAGNOSIS — G309 Alzheimer's disease, unspecified: Secondary | ICD-10-CM | POA: Diagnosis not present

## 2020-03-17 NOTE — ED Notes (Signed)
PT HAS NOT HAD ANY URINE OUTPUT TODAY, CHECKED BRIEF SEVERAL TIMES THROUGHOUT THE DAY. PT DRUNK PLENTY OF FLUIDS THROUGHOUT THE DAY ALSO.

## 2020-03-17 NOTE — Progress Notes (Signed)
03/17/2020  1855  Notified MD that patient has not had urine output since 7am. Pt has had about intake of fluids. Waiting for orders. Will continue to monitor.

## 2020-03-17 NOTE — ED Notes (Signed)
Pt ate all her lunch, brief is dry, will continue to monitor pt.

## 2020-03-17 NOTE — ED Provider Notes (Signed)
Emergency Medicine Observation Re-evaluation Note  Sherry Hicks is a 60 y.o. female, seen on rounds today.  Pt initially presented to the ED for complaints of Altered Mental Status Currently, the patient is awaiting disposition from SW/CM. Per review of note yesterday, likely dc home with daughter  Physical Exam  BP 112/78 (BP Location: Right Arm)   Pulse 60   Temp 97.8 F (36.6 C) (Axillary)   Resp 16   SpO2 100%  Physical Exam General: calm Cardiac: warm and well perfused Lungs: even and  unlabored Psych: calm  ED Course / MDM  EKG:EKG Interpretation  Date/Time:  Thursday February 05 2020 09:44:03 EST Ventricular Rate:  90 PR Interval:  146 QRS Duration: 74 QT Interval:  354 QTC Calculation: 433 R Axis:   74 Text Interpretation: Normal sinus rhythm Possible Left atrial enlargement Borderline ECG more wandering baseline than prior. No STEMI Confirmed by Theda Belfast ([id number]) on 02/06/2020 5:59:13 PM  Clinical Course as of 03/17/20 0947  Tue Jan 27, 2020  1621 Patient remained combative.  We do not have any EKG.  She is not allowing our staff to try an IV or get EKG.  We have given her IM Geodon followed by IM Haldol.  We were able to secure an IV and 1 mg IV Ativan given.  Labs pending at this time. Consult to social work and TTS to be placed soon.   The underlying cause is going to be behavioral and not medical. [AN]    Clinical Course User Index [AN] Derwood Kaplan, MD   I have reviewed the labs performed to date as well as medications administered while in observation.  Recent changes in the last 24 hours include SW discussing possible dc home with daughter.  Plan  Current plan is for dispo per SW - hopefully . Patient is not under full IVC at this time.   Milagros Loll, MD 03/17/20 [phone]

## 2020-03-17 NOTE — Progress Notes (Addendum)
TOC CM/CSW reached out to [name]/pts daughter [phone] .  [name] is currently waiting on a call from psych, she wants to know TTS reasoning behind "Psychiatrically Cleared" and what that in details.  TTS are aware of this and will make contact with [name].  CSW will continue to follow for dc needs.  Tiyon Sanor Tarpley-Carter, MSW, LCSW-A Pronouns:  She, Her, Hers                  [facility] ED Transitions of CareClinical Social Worker [email or web address] [phone]

## 2020-03-18 DIAGNOSIS — G309 Alzheimer's disease, unspecified: Secondary | ICD-10-CM | POA: Diagnosis not present

## 2020-03-18 NOTE — Progress Notes (Addendum)
TOC CM reached out to pt's dtr, Claire Shown and states she did receive a call to discuss patient's behaviors and placement to IP psych facility. States she is waiting a call back. Explained that [facility] CM/CSW will resume search for placement to Memory Care once pt is appropriate for that level of care due to behaviors pt was declined by multiply [facility] facilities stating her care exceeds their level of capabilities. Per dtr, states DSS APS CW, Levora Angel does not feel pt is appropriate to return to her home environment. Dtr states she is unable to provide a 24 hour care, as pt current level will require 24 hour supervision.   Pt has an open case with DSS APS, Levora Angel # [phone] is Caseworker.  Left message for return call.   Isidoro Donning RN CCM, [facility] ED TOC CM [phone]  03/18/2020 2:06 pm Pt DSS Medicaid CSW, Cindee Salt [phone]. Left message for a return call. Pt currently does not have Long Term Care attached to Medicaid. Isidoro Donning RN CCM, [facility] ED TOC CM [phone]
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
## 2020-03-18 NOTE — TOC Progression Note (Signed)
Transition of Care [facility]) - Progression Note    Patient Details  Name: Sherry Hicks MRN: [id number] Date of Birth: [birth year]  Transition [facility]) CM/SW Contact  Theda Belfast Era Skeen, RN Phone Number: 03/18/2020, 12:09 PM  Clinical Narrative:      This TOC supervisor has spoke rep for [facility], they currently have no beds available to meet this patients needs. This excludes beds at at the following facilitites:      [facility] and [facility] [place], [place] 170   [address] [place] [address] Nursing [facility] Center [place], [place] 120   [facility] Nursing and Rehabilitation [place], [place] 200   [facility] [place], [place] 140   [facility] [place], [place] 120   [facility] Rehabilitation Center [place], [place] 272   [facility] [place], [place] 140   [facility] [place], [place] 154   [facility] Nursing and [facility] [employer] [facility] [place], [place] 180   [facility] [place], [place] 175   [facility] and Rehabilitation Center [place], [place] 130   [facility] Nursing and Rehabilitation Center [place], [place] 176   [facility] Nursing and Rehabilitation Center [place], [place] 105   [facility] [place], [place] 536   [facility] [place], [place] 239   [facility] [place], [place] 121   [facility] Nursing and Rehabilitation [place], [place] 165   [facility] Nursing and Rehabilitation [place], [place] 200   [facility] [place] [facility] [place], [place] 114   [facility] [place], [place] 141   [facility] Nursing and [facility] [employer] [place], [place] 96   [facility] and Rehabilitation [place], [place] 105   [facility] and Rehabilitation  [place], [place] 64   [facility] [place], [place] 90   [facility] [place], [place] 40   The [facility] of [place], [place] 100   [facility] and [facility] [employer] [facility] [place], [place] 166   [facility] Nursing and Rehabilitation [place], [place] 34   [facility] [employer] [facility] [employer] [facility] [place], [place] 74   [facility] Nursing and Rehabilitation Center [place], [place]    [facility] and Rehabilitation [place], [place] 25   [facility] [facility] [place], [place] 120   [facility] [employer] [place], [place] 50   [facility] [employer] and Rehabilitation [place], [place] 210   [facility] Nursing and Rehabilitation Center [place], [place] 100   [facility] Nursing and Rehabilitation Center [place], [place] 95   [facility] Nursing and Rehabilitation Center [place], [place] 63   P[facility] Holdings Nursing and Rehabilitation [place], [place] 87   [facility] Nursing and Rehabilitation Center [place], [place] 120   [facility] [place], [place] 180   [facility] [place], [place] 120   [facility]                      The hospital Liaison for [facility] has denied for all facilities as well feeling they can not meet her needs. This exclude the following facilitites from the bed search.  [place]  [place]  STONECREEK-[place]  [place]  [place]  [place] REGION  COLLEGE  PINES-RUTHERF[place] COLLEGE [place]  [place]  [place]  [facility] SNF-N. [place]  [facility] ALF-N [place]  [place] REGION  [place] CARE-[place]  [place] -[place]  [place]  TRIAD  [place]- [place]  [place]  COAST[place] REGION  [place]  [place]   [place]  [place]- [place]

## 2020-03-18 NOTE — ED Notes (Signed)
Completed bladder scan. 144 cc.  Pt said, "I have to pee".

## 2020-03-18 NOTE — Progress Notes (Signed)
TOC CM contacted Cindee Salt DSS CW, # [phone] and Special Assistance Application for Long Term Care has not been received by her dtr. Contacted pt's dtr, Jacquil and states she has completed application and will turn into office tomorrow. Isidoro Donning RN CCM, [facility] ED TOC CM [phone]

## 2020-03-18 NOTE — Progress Notes (Addendum)
TOC CM/CSW attempted to contact Lupita Leash Mitchell/DSS Medicaid SW [phone].  CSW left HIPPA compliant message with my contact information. CSW wanted to inquire about pts Long Term Benefits.  CSW will continue to follow for dc needs.  Camelia Stelzner Tarpley-Carter, MSW, LCSW-A Pronouns:  She, Her, Hers                  [facility] Long ED Transitions of CareClinical Social Worker [email or web address] [phone]

## 2020-03-18 NOTE — ED Provider Notes (Signed)
Emergency Medicine Observation Re-evaluation Note  Sherry Hicks is a 60 y.o. female, seen on rounds today.  Pt initially presented to the ED for complaints of Altered Mental Status Currently, the patient is awaiting SW disposition.  Physical Exam  BP 133/85 (BP Location: Right Arm)   Pulse 62   Temp 98.2 F (36.8 C) (Oral)   Resp 18   SpO2 97%  Physical Exam General: Calm and cooperative  Cardiac: Well perfused Lungs: Even unlabored respirations Psych: Calm  ED Course / MDM  EKG:EKG Interpretation  Date/Time:  Thursday February 05 2020 09:44:03 EST Ventricular Rate:  90 PR Interval:  146 QRS Duration: 74 QT Interval:  354 QTC Calculation: 433 R Axis:   74 Text Interpretation: Normal sinus rhythm Possible Left atrial enlargement Borderline ECG more wandering baseline than prior. No STEMI Confirmed by Theda Belfast ([id number]) on 02/06/2020 5:59:13 PM  Clinical Course as of 03/18/20 0829  Tue Jan 27, 2020  1621 Patient remained combative.  We do not have any EKG.  She is not allowing our staff to try an IV or get EKG.  We have given her IM Geodon followed by IM Haldol.  We were able to secure an IV and 1 mg IV Ativan given.  Labs pending at this time. Consult to social work and TTS to be placed soon.   The underlying cause is going to be behavioral and not medical. [AN]    Clinical Course User Index [AN] Derwood Kaplan, MD   I have reviewed the labs performed to date as well as medications administered while in observation.  Recent changes in the last 24 hours include patient psych cleared.  No evidence of urinary retention on bladder scan (144 mL).  Would hold on in and out cath at this time.   Plan  Current plan is for SW disposition. Patient is not under full IVC at this time.   Maia Plan, MD 03/19/20 [phone]

## 2020-03-18 NOTE — ED Notes (Signed)
Staff cleaned pt and she was not soiled with urine or stool.  She is eating and drinking normally.  She is alert and pleasant. Informed Dr. Jacqulyn Bath.

## 2020-03-19 DIAGNOSIS — G309 Alzheimer's disease, unspecified: Secondary | ICD-10-CM | POA: Diagnosis not present

## 2020-03-19 NOTE — ED Notes (Signed)
Pt alert this shift. Pt confused. Medication compliant. Pt needs assist with ADLs. Pt cooperative with care.

## 2020-03-19 NOTE — Progress Notes (Signed)
TOC CM spoke to pt's dtr, and states she did submit application for Special Assistance for Long Term Care. She is requesting PT work with pt. ED provider updated. Isidoro Donning RN CCM, [facility] ED TOC CM [phone]

## 2020-03-19 NOTE — ED Provider Notes (Signed)
Emergency Medicine Observation Re-evaluation Note  Sherry Hicks is a 60 y.o. female, seen on rounds today.  Pt initially presented to the ED for complaints of Altered Mental Status Currently, the patient is resting quietly.  Physical Exam  BP 125/86 (BP Location: Right Arm)   Pulse 86   Temp 98 F (36.7 C) (Oral)   Resp 16   SpO2 97%  Physical Exam General: No acute distress Cardiac: Well perfused Lungs: Even unlabored respirations Psych: Cooperative  ED Course / MDM  EKG:EKG Interpretation  Date/Time:  Thursday February 05 2020 09:44:03 EST Ventricular Rate:  90 PR Interval:  146 QRS Duration: 74 QT Interval:  354 QTC Calculation: 433 R Axis:   74 Text Interpretation: Normal sinus rhythm Possible Left atrial enlargement Borderline ECG more wandering baseline than prior. No STEMI Confirmed by Theda Belfast ([id number]) on 02/06/2020 5:59:13 PM  Clinical Course as of 03/19/20 0952  Tue Jan 27, 2020  1621 Patient remained combative.  We do not have any EKG.  She is not allowing our staff to try an IV or get EKG.  We have given her IM Geodon followed by IM Haldol.  We were able to secure an IV and 1 mg IV Ativan given.  Labs pending at this time. Consult to social work and TTS to be placed soon.   The underlying cause is going to be behavioral and not medical. [AN]    Clinical Course User Index [AN] Derwood Kaplan, MD   I have reviewed the labs performed to date as well as medications administered while in observation.  Recent changes in the last 24 hours include bed search for inpatient psychiatric bed or memory care unit.  Plan  Current plan is for bed search. Patient is not under full IVC at this time.   Terrilee Files, MD 03/19/20 [phone]

## 2020-03-20 DIAGNOSIS — G309 Alzheimer's disease, unspecified: Secondary | ICD-10-CM | POA: Diagnosis not present

## 2020-03-20 NOTE — ED Provider Notes (Signed)
Emergency Medicine Observation Re-evaluation Note  Sherry Hicks is a 60 y.o. female, seen on rounds today.  Pt initially presented to the ED for complaints of Altered Mental Status Currently, the patient is resting.  Physical Exam  BP 113/85 (BP Location: Right Arm)   Pulse 86   Temp 98 F (36.7 C) (Oral)   Resp 17   SpO2 100%  Physical Exam General: NAD Cardiac: Regular rate Lungs: No respiratory distress Psych: Stable  ED Course / MDM  EKG:EKG Interpretation  Date/Time:  Thursday February 05 2020 09:44:03 EST Ventricular Rate:  90 PR Interval:  146 QRS Duration: 74 QT Interval:  354 QTC Calculation: 433 R Axis:   74 Text Interpretation: Normal sinus rhythm Possible Left atrial enlargement Borderline ECG more wandering baseline than prior. No STEMI Confirmed by Theda Belfast ([id number]) on 02/06/2020 5:59:13 PM  I have reviewed the labs performed to date as well as medications administered while in observation.  Recent changes in the last 24 hours include none  Plan  Current plan is for bed placement search. Patient is not under full IVC at this time.   Terald Sleeper, MD 03/20/20 [phone]

## 2020-03-20 NOTE — ED Notes (Signed)
Pt alert this shift. Pt confused. Pt resting comfortably without agitation. Medication compliant. Pt needs assist with ADLs.

## 2020-03-20 NOTE — Evaluation (Signed)
Physical Therapy Evaluation Patient Details Name: SAVEAH BAHAR MRN: [id number] DOB: Sep 10, 1960 Today's Date: 03/20/2020   History of Present Illness  60 yo female with H/O  dementia, asthma, apraxia, admitted  from home where she is cared for by her daughter.  Patient admitted due to need for higher level of care.  Clinical Impression  Pt not able to participate in PT at present. She did not respond to instructions from PT. With much encouragement and ongoing redirecting from nurse tech, pt did agree to stand up. She became upset if PT approached her at all. Per RN, pt ambulates to the bathroom with assist of 1. Very limited evaluation today as pt was unable to follow commands and did not allow PT to perform assessment. She would benefit from ambulating with nursing 2-3x/day to minimize further deconditioning. PT signing off as pt is unable to participate.      Follow Up Recommendations Supervision for mobility/OOB    Equipment Recommendations       Recommendations for Other Services       Precautions / Restrictions Precautions Precautions: Fall Precaution Comments: requires redirection, very easily distractable Restrictions Weight Bearing Restrictions: No      Mobility  Bed Mobility Overal bed mobility: Needs Assistance Bed Mobility: Supine to Sit     Supine to sit: Supervision     General bed mobility comments: pt resistant to assistance with mobility, she did not want to be touched, did not respond to commands, required much encouragement and redirecting to participate, supervision with mobility    Transfers Overall transfer level: Needs assistance Equipment used: 1 person hand held assist Transfers: Sit to/from Stand Sit to Stand: Min assist;Min guard         General transfer comment: resistant to assistance, required much redirection to participate, pt continuously distracted, once in standing pt was noted to be soiled so NTs assisted pt with  cleanup  Ambulation/Gait             General Gait Details: NT -per RN pt ambulates to bathroom with HHA  Stairs            Wheelchair Mobility    Modified Rankin (Stroke Patients Only)       Balance Overall balance assessment: Needs assistance Sitting-balance support: Feet supported;No upper extremity supported Sitting balance-Leahy Scale: Good Sitting balance - Comments: close  guardingf due to restlessness   Standing balance support: During functional activity Standing balance-Leahy Scale: Fair                               Pertinent Vitals/Pain Pain Assessment: No/denies pain Faces Pain Scale: No hurt    Home Living Family/patient expects to be discharged to:: Private residence Living Arrangements: Children Available Help at Discharge: Family             Additional Comments: pt unable to provide above information    Prior Function           Comments: unsure     Hand Dominance        Extremity/Trunk Assessment   Upper Extremity Assessment Upper Extremity Assessment: Overall WFL for tasks assessed    Lower Extremity Assessment Lower Extremity Assessment: Generalized weakness    Cervical / Trunk Assessment Cervical / Trunk Assessment: Normal  Communication   Communication:  (random comments, not pertinent)  Cognition Arousal/Alertness: Awake/alert Behavior During Therapy: Anxious;Agitated;Impulsive Overall Cognitive Status: Impaired/Different from baseline Area of Impairment: Orientation;Attention;Following  commands;Safety/judgement;Awareness                 Orientation Level: Place;Time;Situation Current Attention Level: Focused   Following Commands: Follows one step commands inconsistently Safety/Judgement: Decreased awareness of safety Awareness: Intellectual   General Comments: H/O dementia      General Comments      Exercises     Assessment/Plan    PT Assessment Patent does not need any  further PT services  PT Problem List Decreased mobility;Decreased knowledge of precautions;Decreased activity tolerance;Decreased cognition;Decreased balance;Decreased knowledge of use of DME       PT Treatment Interventions      PT Goals (Current goals can be found in the Care Plan section)       Frequency     Barriers to discharge        Co-evaluation               AM-PAC PT "6 Clicks" Mobility  Outcome Measure Help needed turning from your back to your side while in a flat bed without using bedrails?: A Little Help needed moving from lying on your back to sitting on the side of a flat bed without using bedrails?: A Little Help needed moving to and from a bed to a chair (including a wheelchair)?: A Little Help needed standing up from a chair using your arms (e.g., wheelchair or bedside chair)?: A Little Help needed to walk in hospital room?: A Little Help needed climbing 3-5 steps with a railing? : A Little 6 Click Score: 18    End of Session   Activity Tolerance: Patient tolerated treatment well Patient left: in bed;with call bell/phone within reach;with nursing/sitter in room Nurse Communication: Mobility status PT Visit Diagnosis: Unsteadiness on feet (R26.81);Difficulty in walking, not elsewhere classified (R26.2)    Time: 8676-7209 PT Time Calculation (min) (ACUTE ONLY): 10 min   Charges:   PT Evaluation $PT Eval Low Complexity: 1 Low          Ariona, Deschene Kistler PT 03/20/2020  Acute Rehabilitation Services Pager [phone] Office [phone]

## 2020-03-21 DIAGNOSIS — G309 Alzheimer's disease, unspecified: Secondary | ICD-10-CM | POA: Diagnosis not present

## 2020-03-21 NOTE — ED Provider Notes (Signed)
Emergency Medicine Observation Re-evaluation Note  Sherry Hicks is a 60 y.o. female, seen on rounds today.  Pt initially presented to the ED for complaints of Altered Mental Status Currently, the patient is sitting in bed, no complaints.  Physical Exam  BP 109/75 (BP Location: Left Arm)   Pulse 60   Temp (!) 97.3 F (36.3 C) (Axillary)   Resp 18   SpO2 96%  Physical Exam General: awake, alert Cardiac: normal rate Lungs: no increased work of breathing Psych: calm, cooperative  ED Course / MDM  EKG:EKG Interpretation  Date/Time:  Thursday February 05 2020 09:44:03 EST Ventricular Rate:  90 PR Interval:  146 QRS Duration: 74 QT Interval:  354 QTC Calculation: 433 R Axis:   74 Text Interpretation: Normal sinus rhythm Possible Left atrial enlargement Borderline ECG more wandering baseline than prior. No STEMI Confirmed by Theda Belfast ([id number]) on 02/06/2020 5:59:13 PM  Clinical Course as of 03/21/20 0808  Tue Jan 27, 2020  1621 Patient remained combative.  We do not have any EKG.  She is not allowing our staff to try an IV or get EKG.  We have given her IM Geodon followed by IM Haldol.  We were able to secure an IV and 1 mg IV Ativan given.  Labs pending at this time. Consult to social work and TTS to be placed soon.   The underlying cause is going to be behavioral and not medical. [AN]    Clinical Course User Index [AN] Derwood Kaplan, MD   I have reviewed the labs performed to date as well as medications administered while in observation.  Recent changes in the last 24 hours include none.  Plan  Current plan is for awaiting placement. Patient is not under full IVC at this time.   Rolan Bucco, MD 03/21/20 [phone]

## 2020-03-21 NOTE — ED Notes (Signed)
Pt has been pleasant this shift. Pt needs assistance with feeding self. Takes her medications crushed in applesauce. Pt requires multiple staff when being cleaned up due to behavior issues.

## 2020-03-21 NOTE — ED Notes (Signed)
Pt in bed resting. Pt takes meds crushed in applesauce with no problems. Pt needs assistance eating and ADL. Pt is pleasant, no s/s of pain or distress.

## 2020-03-22 NOTE — ED Notes (Signed)
Pt alert this shift. Pt confused. Pt can not respond to assessment question  Pt resting in bed comfortably. Medication compliant. Pt needs assist with ADLs.  Transitional resistance with care.

## 2020-03-22 NOTE — Progress Notes (Signed)
TOC CM/CSW has not received a call from [facility], therefore CSW will handoff to [facility] CM/CSW for 2nd shift.  Paperwork requested by Lowella Bandy has been emailed to [email or web address]  CSW will continue to follow for dc needs.  Sherry Hicks, MSW, LCSW-A Pronouns:  She, Her, Hers                  [facility] ED Transitions of CareClinical Social Worker [email or web address] [phone]

## 2020-03-22 NOTE — Progress Notes (Signed)
TOC CM/CSW attempted to contact [facility], [place], [place] [phone].  CSW left HIPPA compliant message with my contact information.  Kyung Rudd, [facility] Corporate Community Relations Liaison [place] [employer] [phone]   CSW will continue to follow for dc needs.  Marieliz Strang Tarpley-Carter, MSW, LCSW-A Pronouns:  She, Her, Hers                  [facility] Long ED Transitions of CareClinical Social Worker [email or web address] [phone]

## 2020-03-23 DIAGNOSIS — G309 Alzheimer's disease, unspecified: Secondary | ICD-10-CM | POA: Diagnosis not present

## 2020-03-23 NOTE — ED Provider Notes (Signed)
Patient resting comfortably at this time.  No new complaints.  Awaiting placement   Lorre Nick, MD 03/23/20 [phone]

## 2020-03-23 NOTE — Progress Notes (Signed)
TOC CM/CSW received a call from [facility] [phone].  Lowella Bandy stated that the paperwork had been received and she follow up with CSW today in regards to pt.  CSW will continue to follow for dc needs.  Tajae Rybicki Tarpley-Carter, MSW, LCSW-A Pronouns:  She, Her, Hers                  [facility] Long ED Transitions of CareClinical Social Worker [email or web address] [phone]

## 2020-03-24 DIAGNOSIS — F0391 Unspecified dementia with behavioral disturbance: Secondary | ICD-10-CM | POA: Diagnosis present

## 2020-03-24 DIAGNOSIS — G309 Alzheimer's disease, unspecified: Secondary | ICD-10-CM | POA: Diagnosis not present

## 2020-03-24 DIAGNOSIS — F0281 Dementia in other diseases classified elsewhere with behavioral disturbance: Secondary | ICD-10-CM | POA: Diagnosis not present

## 2020-03-24 DIAGNOSIS — F03918 Unspecified dementia, unspecified severity, with other behavioral disturbance: Secondary | ICD-10-CM | POA: Diagnosis present

## 2020-03-24 NOTE — Progress Notes (Signed)
TOC CM/CSW contacted [facility] [phone].  Lowella Bandy stated that the paperwork had been received and she will follow up with her facility in [place] about review.  Admission Specialist at facility was off yesterday.  Lowella Bandy will keep CSW updated.  CSW will continue to follow for dc needs.  Calloway Andrus Tarpley-Carter, MSW, LCSW-A Pronouns: She, Her, Hers [facility] Long ED Transitions of CareClinical Social Worker [email or web address] [phone]

## 2020-03-24 NOTE — H&P (Addendum)
History and Physical  Patient Name: ELAYAH KLOOSTER     LDJ:[id number]    DOB: 08/22/1960    DOA: 01/27/2020 PCP: [name], DO  Patient coming from: Home  Chief Complaint: Progressively erratic behavior      HPI: TIFFANYANN DEROO is a 60 y.o. F with hx dementia, suspected psychiatric disorder NOS, and suspected substance use history who presented with progressively erratic behavior over months.  Caveat that patient has advanced cognitive impairment, and all history collected from daughter by phone and from chart.  For excellent Neurological history and assessment, please see office note from Dr. Karel Jarvis from 10/14/2019.    Patient was living independently in [place] for many years until about 4-5 years ago, daughter started to notice she was more "distracted" on the phone and then 2 years ago family received reports she was behaving erratically (wandering outside, not bathing, speaking aggressively).  Family brought her to [place], and she lived briefly with sister and then moved in with daughter here in [place] for the last ~18 months.  Since being here, she has continued to have cognitive decline.  On [facility] evaluation in Dec 2020 she had a MOCA 3/30, MRI brain that showed generalized atrophy, was diagnosed with Alzheimer's with an underlying untreated psychiatric disorder in the setting of prior drug use.  Although completely sober from illicit substances obviously since moving in with her daughter, her cognitive impairment has gradually worsened in the last year to the point that she can now no longer answer really any questions coherently.   Eventually, in the last few months, she was working with LCSW at her clinic for placement.  Unfortunately, APS were called, and they recommended to family that she be brought to the ER, because otherwise daughter might be liable in some manner if the patient were injured or injured someone else.  Patient was brought to the ER on Jan 27, 2020.  She  has been here since then, in psychiatric holding, awaiting placement.  She has been started on Seroquel, and this was titrated up to doses that now appear effective. She continues to await placement.              ROS: Review of Systems  Unable to perform ROS: Dementia    But overall appears comfortable and seems to suggest, no she has no complaints.      Past Medical History:  Diagnosis Date  . Alteration in performance of activities of daily living 08/29/2019  . Apraxia 08/29/2019  . Arthritis   . Dementia (HCC)   . Dementia with behavioral problem (HCC)   . History of asthma, mild, intermittent 02/14/2015  . Hypertension   . Smoker     Past Surgical History:  Procedure Laterality Date  . ANKLE SURGERY    . SALPINGOOPHORECTOMY    . TOTAL ABDOMINAL HYSTERECTOMY W/ BILATERAL SALPINGOOPHORECTOMY     for menorrhagia and pregnancy loss) and no longer requires GYN screening    Social History: Patient lived with her daughter.  The patient walks unassisted.  Former smoker.  Allergies  Allergen Reactions  . Hydrocodone-Acetaminophen Nausea And Vomiting  . Donepezil Other (See Comments)    Urinary Incontinence    Family history: family history includes Asthma in her father and paternal grandfather; Canavan disease in her sister; Cancer in her brother, father, paternal grandfather, and another family member; Dementia in her mother and paternal grandmother; Diabetes in her maternal grandmother and another family member; Hypertension in her maternal grandmother and  mother.  Prior to Admission medications   Medication Sig Start Date End Date Taking? Authorizing Provider  acetaminophen (TYLENOL) 500 MG tablet Take 500 mg by mouth every 6 (six) hours as needed for moderate pain.   Yes [provider]  traZODone (DESYREL) 100 MG tablet Take 1 tablet (100 mg total) by mouth at bedtime. 01/14/20  Yes [name], [name], DO  Ensure Plus (ENSURE PLUS) LIQD Take 1 Can by mouth  2 (two) times daily between meals. Patient not taking: No sig reported 11/18/19   [name], [name], DO       Physical Exam: BP 103/90 (BP Location: Left Arm)   Pulse 86   Temp 98.3 F (36.8 C) (Axillary)   Resp 16   SpO2 100%  General appearance: Thin adult female, alert and in no acute distress, resting in bed, covers pulled up, interactive, responds with normal cadence.   Eyes: Anicteric, conjunctiva pink, lids and lashes normal. ENT: No nasal deformity, discharge, epistaxis.  Hearing normal. Lips normal, dentition in good repair, OP moist.   Cardiac: RRR, nl S1-S2, no murmurs appreciated.    Respiratory: Patient declines this exam. Abdomen: Patient declines this exam. MSK: Somewhat reduced muscle mass and fat.  No deformities visible, but patient refuses exam.    Neuro: Pupils equal, face symmetric.  Patient declines following commands for exam.  Speech is fluent but tangential; she cannot complete a thought/sentence.   Psych: Calm, pleasant.  Attentive to me, makes eye contact, responds to questions, attention is distracted often, however, and her responses are nonsensical, she does not appear to see individuals who are not there or attend to internal stimuli or attend to hallucinations, but she mentions "that man" or "my sister" who are clearly not present nor have been.  Cognitive impairment seems advanced.      Labs on Admission:  I have personally reviewed following labs and imaging studies: CBC: Blood counts normal last Dec when she first presented.  Basic Metabolic Panel: Renal function and electrolytes normal last Dec when admitted.  LFTs slightly elevated.    TSH slightly elevated    EKG: Independently reviewed ECG from Dec 23, normal QTc.       Assessment/Plan   Dementia with behavioral disturbance Patient has been in the ER for over 6 weeks.  During that time psychiatry were consulted, and started Seroquel, initially at a dose of 50 mg/day.  She is  currently been titrated up to a dose of 350 mg total per day (100 mg twice daily +150 mg nightly), as well as gabapentin 100 mg twice daily and hydroxyzine 25 3 times a day.  This combination appears to have improved her agitation.  On review of her MAR, I can see that she has not required IM Haldol, Geodon or any other sedating medicines for over 10 days.  -Continue Seroquel 100 mg twice daily -Continue Seroquel 150 mg nightly -Continue gabapentin 100 mg twice daily -Continue hydroxyzine 25 mg 3 times daily  -Consult TOC for assistance with placement, appreciate assistance -Continue feeding supplement -Encourage mobility   -Discussed with Psychiatry: given newly started Seroquel:  they recommend obtaining a new ECG for monitoring QTc As well as monitoring for metabolic syndrome with A1c, lipids and TSH within the next 6 weeks (and q3 months after that)     Transaminitis -Obtain repeat LFTs on Monday when we draw repeat labs   Moderate protein calorie malnutrition Based on 50kg weight loss since 2020, poor PO intake, reduced muscle mass and  fat, chronic dementia.       DVT prophylaxis: Low risk, in outpatient status  Code Status: FULL  Family Communication: Daughter by phone          Admission status: OBS     Medical decision making: Patient seen at 2:51 PM on 03/24/2020.  The patient was discussed with Dr. Darnelle Catalan, York Ram, and Caryn Bee, FNP.  What exists of the patient's chart was reviewed in depth and summarized above.  Clinical condition: stable.        Earl Lites Nathanal Hermiz [facility] Please page though AMION or Epic secure chat:  For password, contact charge nurse

## 2020-03-24 NOTE — Progress Notes (Addendum)
CSW spoke with Levora Angel at [facility] who states the APS case is closed. Donovan Kail was agreeable to assist with placement as needed.  Edwin Dada, MSW, LCSW Transitions of Care  Clinical Social Worker II [phone]

## 2020-03-24 NOTE — NC FL2 (Signed)
[place] MEDICAID FL2 LEVEL OF CARE SCREENING TOOL     IDENTIFICATION  Patient Name: Sherry Hicks Birthdate: 09-14-1960 Sex: female Admission Date (Current Location): 01/27/2020  [place] and [place] Number:  [name] [id number] [facility] Facility and Address:  [facility],  [address], [place] [address]      Provider Number: [id number]  Attending Physician Name and Address:  Alberteen Sam, *  Relative Name and Phone Number:  Anushka Hartinger [phone]    Current Level of Care: Hospital Recommended Level of Care: Memory Care,Skilled Nursing Facility Prior Approval Number:    Date Approved/Denied:   PASRR Number: [id number]  Discharge Plan: Other (Comment) (To be determined)    Current Diagnoses: Patient Active Problem List   Diagnosis Date Noted  . Dementia with behavioral disturbance (HCC) 03/24/2020  . History of mood disorder 08/29/2019  . Apraxia 08/29/2019  . Alteration in performance of activities of daily living 08/29/2019  . Caregiver stress 08/29/2019  . Early Onset Dementia with behavioral problem (HCC)   . Hyperlipidemia 07/30/2019  . Weight loss, unintentional 07/06/2019  . Hypernatremia 07/06/2019  . History of asthma, mild, intermittent 2017  . Major depressive disorder, recurrent episode (HCC) 04/12/2006  . HYPERTENSION, BENIGN SYSTEMIC 04/12/2006    Orientation RESPIRATION BLADDER Height & Weight      (Not oriented)  Normal Incontinent Weight:   Height:     BEHAVIORAL SYMPTOMS/MOOD NEUROLOGICAL BOWEL NUTRITION STATUS      Incontinent Diet (Finger foods)  AMBULATORY STATUS COMMUNICATION OF NEEDS Skin   Extensive Assist Verbally Normal                       Personal Care Assistance Level of Assistance  Bathing,Feeding,Dressing Bathing Assistance: Maximum assistance Feeding assistance: Maximum assistance Dressing Assistance: Maximum assistance     Functional Limitations Info  Speech,Hearing,Sight Sight Info:  Adequate Hearing Info: Adequate Speech Info: Adequate    SPECIAL CARE FACTORS FREQUENCY  PT (By licensed PT),OT (By licensed OT) Blood Pressure Frequency: 5x weekly   PT Frequency: 5x weekly OT Frequency: 5x weekly            Contractures Contractures Info: Not present    Additional Factors Info  Code Status,Allergies Code Status Info: Full Allergies Info: Hydrocodone-acetaminophen and Donepezil           Current Medications (03/24/2020):  This is the current hospital active medication list Current Facility-Administered Medications  Medication Dose Route Frequency Provider Last Rate Last Admin  . acetaminophen (TYLENOL) tablet 500 mg  500 mg Oral Q6H PRN Derwood Kaplan, MD   500 mg at 03/04/20 1927  . feeding supplement (ENSURE ENLIVE / ENSURE PLUS) liquid 237 mL  237 mL Oral BID BM Cathren Laine, MD   237 mL at 03/24/20 0955  . gabapentin (NEURONTIN) capsule 100 mg  100 mg Oral BID Rankin, Shuvon B, NP   100 mg at 03/24/20 0955  . hydrOXYzine (ATARAX/VISTARIL) tablet 25 mg  25 mg Oral TID Leevy-[place], Brooke A, NP   25 mg at 03/24/20 0955  . hydrOXYzine (VISTARIL) injection 50 mg  50 mg Intramuscular Q12H PRN Maryagnes Amos, FNP   50 mg at 02/27/20 2331  . QUEtiapine (SEROQUEL) tablet 100 mg  100 mg Oral BID Aldean Baker, NP   100 mg at 03/24/20 0819  . QUEtiapine (SEROQUEL) tablet 150 mg  150 mg Oral QHS Aldean Baker, NP   150 mg at 03/23/20 2153  . QUEtiapine (SEROQUEL)  tablet 50 mg  50 mg Oral BID PRN Aldean Baker, NP   50 mg at 03/08/20 1205   Current Outpatient Medications  Medication Sig Dispense Refill  . acetaminophen (TYLENOL) 500 MG tablet Take 500 mg by mouth every 6 (six) hours as needed for moderate pain.    . traZODone (DESYREL) 100 MG tablet Take 1 tablet (100 mg total) by mouth at bedtime. 30 tablet 3  . Ensure Plus (ENSURE PLUS) LIQD Take 1 Can by mouth 2 (two) times daily between meals. (Patient not taking: No sig reported) 14220 mL 2      Discharge Medications: Please see discharge summary for a list of discharge medications.  Relevant Imaging Results:  Relevant Lab Results:   Additional Information SSN#:  [id number]  Inis Sizer, LCSW

## 2020-03-25 DIAGNOSIS — F0281 Dementia in other diseases classified elsewhere with behavioral disturbance: Secondary | ICD-10-CM | POA: Diagnosis not present

## 2020-03-25 DIAGNOSIS — G309 Alzheimer's disease, unspecified: Secondary | ICD-10-CM | POA: Diagnosis not present

## 2020-03-25 LAB — SARS CORONAVIRUS 2 (TAT 6-24 HRS): SARS Coronavirus 2: NEGATIVE

## 2020-03-25 NOTE — ED Notes (Signed)
Patient talk to someone named darcy. No one is in the room with this patient. Patient cussing at times.

## 2020-03-25 NOTE — Progress Notes (Signed)
..   Transition of Care [facility]) - Emergency Department Mini Assessment   Patient Details  Name: Sherry Hicks MRN: [id number] Date of Birth: 1960-07-03  Transition of Care [facility]) CM/SW Contact:    Audrena Talaga C Tarpley-Carter, LCSWA Phone Number: 03/25/2020, 3:45 PM   Clinical Narrative: TOC CM/CSW was contacted by Sherry Hicks.  Sherry Hicks stated that pt was denied by Hicks, but she would be in the area on 03/31/2020 and would like to do an assessment for possible placement.  CSW accepted the opportunity for assessment until pt finds placement.  CSW faxed nurses notes for Sherry Hicks to review.  Kahmari Herard Tarpley-Carter, MSW, LCSW-A Pronouns:  She, Her, Hers                  [facility] Transitions of CareClinical Social Worker [email or web address] [phone]   ED Mini Assessment: What brought you to the Emergency Department? : [facility] Evaluation  Barriers to Discharge: No Barriers Identified     Means of departure: Not know  Interventions which prevented an admission or readmission: Other (must enter comment) (Possible Placement)    Patient Contact and Communications Key Contact 1: Sherry Hicks   Spoke with: Charolotte Eke Contact Date: 03/25/20,   Contact time: 1037 Contact Phone Number: [phone] Call outcome: Sherry Hicks stated that pt was denied by [facility].      Choice offered to / list presented to : NA  Admission diagnosis:  Dementia with behavioral disturbance (HCC) [F03.91] Patient Active Problem List   Diagnosis Date Noted  . Dementia with behavioral disturbance (HCC) 03/24/2020  . History of mood disorder 08/29/2019  . Apraxia 08/29/2019  . Alteration in performance of activities of daily living 08/29/2019  . Caregiver stress 08/29/2019  . Early Onset Dementia with behavioral problem (HCC)   . Hyperlipidemia 07/30/2019  . Weight loss, unintentional 07/06/2019  . Hypernatremia 07/06/2019  . History of asthma,  mild, intermittent 2017  . Major depressive disorder, recurrent episode (HCC) 04/12/2006  . HYPERTENSION, BENIGN SYSTEMIC 04/12/2006   PCP:  [name], DO Pharmacy:   [facility] [phone] [place], [place] - [phone] AT [facility] [address] [place] [place] [address] Phone: [phone] Fax: [phone]

## 2020-03-25 NOTE — ED Notes (Signed)
Patient is easier to redirect during ADL's.

## 2020-03-25 NOTE — Progress Notes (Signed)
[facility] PROGRESS NOTE    Sherry Hicks  AYT:[id number] DOB: 1960-07-13 DOA: 01/27/2020 PCP: [name], DO      Brief Narrative:  Sherry Hicks is a 60 y.o. F with dementia with hallucinations and agitation, and remote substance abuse history who presented with erratic behavior.  Patient was living independently until 2 years ago.  Then diagnosed with dementia, moved to [place] to live with family.  In last few months, was developing progressively more unpredictable behavior, until APS recommended she be sent to the ER.  In the ER, patient was seen by Psychiatry and started on Seroquel.  Doses titrated to the point that the patient is now calm and pleasant, just with advanced dementia.          Assessment & Plan:  Alzheimer's dementia  Patient had eval at [facility] and [facility] (see note by Dr. Karel Jarvis from 10/14/19 for excellent clinical summary), where she was diagnosed with an early onset Alzheimer's disease.  Patient had MOCA 3/30 in 2020, likely lower now, she really can't complete a sentence, and just babbles fluently.  With regard to hallucinations, she had no prior history of psychosis, was suspected to have used illicit drugs [facility] +/- injected crack, it is unclear) prior to moving back to [place] from [place], but her hallucinations may also simply be manifestations of her Alzheimer's.  Before admission, she had gotten to the point that she was jumping off furniture, attempting to elope the house.  Here she was initially agitated and hard to redirect, required IM Haldol and Geodon.   Gradually with titration of Seroquel, she has improved considerably.  Now ambulating short distances in her room, eating well.  Is pleasant in her interactions with me, and mostly cooperative with ADLs.  No IM Haldol or Geodon in >2 weeks.  -Continue Seroquel -Continue hydroxyzine -Continue gabapentin  -Repeat TSH, Lipids, A1c on  Monday   Transaminitis Mild, asymptomatic elevation noted on admission, not repeated since. -Repeat LFTs on Monday  Moderate protein calorie malnutrition As evidenced by low body weight, loss of muscle mas and fat of 50% since May 2020. -Continue nutritional supplements  Hypertension Chart history, BP normal here off meds        Disposition: Status is: Observation  The patient remains OBS appropriate and will d/c before 2 midnights.  Dispo: The patient is from: Home              Anticipated d/c is to: ALF              Anticipated d/c date is: > 3 days              Patient currently is medically stable to d/c.   Difficult to place patient Yes       Level of care: Med-Surg       MDM: The below labs and imaging reports were reviewed and summarized above.  Medication management as above.    DVT prophylaxis: None, low risk, outpatient status  Code Status: FULL Family Communication: None today            Subjective: Patient has no complaints.  No fever.  No nursing complaints.  Objective: Vitals:   03/24/20 2108 03/24/20 2300 03/25/20 0602 03/25/20 1123  BP: 109/70 109/70 101/76 115/86  Pulse: 86 86 62 70  Resp: 18 18 18 18   Temp:   (!) 97.3 F (36.3 C) 97.6 F (36.4 C)  TempSrc:   Axillary Axillary  SpO2:  100% 100% 100% 100%   No intake or output data in the 24 hours ending 03/25/20 1422 There were no vitals filed for this visit.  Examination: General appearance: Thin adult female, alert and in no acute distress.   HEENT: Anicteric, conjunctiva pink, lids and lashes normal. No nasal deformity, discharge, epistaxis.  Lips moist.   MSK: Diffuse loss of subcutaneous muscle mass and fat. Neuro: Awake and makes eye contact, says hello and reaches to shake my hand.  Moves upper extremities with mild lassitude, symmetrically. Speech fluen but babbling mostly. Psych: Sensorium intact and responding to questions, attention diminished.  Mentions her  sister, niece, mother, brothers, coming to visit and seems to be saying that she had a good time with them.   Affect pleasant.  Judgment and insight appear severely impaired.       Radiology Studies: No results found.      Scheduled Meds: . feeding supplement  237 mL Oral BID BM  . gabapentin  100 mg Oral BID  . hydrOXYzine  25 mg Oral TID  . QUEtiapine  100 mg Oral BID  . QUEtiapine  150 mg Oral QHS   Continuous Infusions:   LOS: 0 days    Time spent: 15 minutes    Alberteen Sam, MD [facility] 03/25/2020, 2:22 PM     Please page though AMION or Epic secure chat:  For [employer], Higher education careers adviser

## 2020-03-26 DIAGNOSIS — F0391 Unspecified dementia with behavioral disturbance: Secondary | ICD-10-CM | POA: Diagnosis present

## 2020-03-26 DIAGNOSIS — Z79899 Other long term (current) drug therapy: Secondary | ICD-10-CM | POA: Diagnosis not present

## 2020-03-26 DIAGNOSIS — G300 Alzheimer's disease with early onset: Secondary | ICD-10-CM | POA: Diagnosis not present

## 2020-03-26 DIAGNOSIS — F05 Delirium due to known physiological condition: Secondary | ICD-10-CM | POA: Diagnosis not present

## 2020-03-26 DIAGNOSIS — G4700 Insomnia, unspecified: Secondary | ICD-10-CM | POA: Diagnosis not present

## 2020-03-26 DIAGNOSIS — Z6820 Body mass index (BMI) 20.0-20.9, adult: Secondary | ICD-10-CM | POA: Diagnosis not present

## 2020-03-26 DIAGNOSIS — Z825 Family history of asthma and other chronic lower respiratory diseases: Secondary | ICD-10-CM | POA: Diagnosis not present

## 2020-03-26 DIAGNOSIS — Z90710 Acquired absence of both cervix and uterus: Secondary | ICD-10-CM | POA: Diagnosis not present

## 2020-03-26 DIAGNOSIS — G309 Alzheimer's disease, unspecified: Secondary | ICD-10-CM | POA: Diagnosis not present

## 2020-03-26 DIAGNOSIS — L89212 Pressure ulcer of right hip, stage 2: Secondary | ICD-10-CM | POA: Diagnosis not present

## 2020-03-26 DIAGNOSIS — R7401 Elevation of levels of liver transaminase levels: Secondary | ICD-10-CM | POA: Diagnosis not present

## 2020-03-26 DIAGNOSIS — Z781 Physical restraint status: Secondary | ICD-10-CM | POA: Diagnosis not present

## 2020-03-26 DIAGNOSIS — F0281 Dementia in other diseases classified elsewhere with behavioral disturbance: Secondary | ICD-10-CM | POA: Diagnosis not present

## 2020-03-26 DIAGNOSIS — Z87891 Personal history of nicotine dependence: Secondary | ICD-10-CM | POA: Diagnosis not present

## 2020-03-26 DIAGNOSIS — I10 Essential (primary) hypertension: Secondary | ICD-10-CM | POA: Diagnosis not present

## 2020-03-26 DIAGNOSIS — Z7401 Bed confinement status: Secondary | ICD-10-CM | POA: Diagnosis not present

## 2020-03-26 DIAGNOSIS — Z8249 Family history of ischemic heart disease and other diseases of the circulatory system: Secondary | ICD-10-CM | POA: Diagnosis not present

## 2020-03-26 DIAGNOSIS — R441 Visual hallucinations: Secondary | ICD-10-CM | POA: Diagnosis not present

## 2020-03-26 DIAGNOSIS — R32 Unspecified urinary incontinence: Secondary | ICD-10-CM | POA: Diagnosis not present

## 2020-03-26 DIAGNOSIS — Z20822 Contact with and (suspected) exposure to covid-19: Secondary | ICD-10-CM | POA: Diagnosis not present

## 2020-03-26 DIAGNOSIS — F419 Anxiety disorder, unspecified: Secondary | ICD-10-CM | POA: Diagnosis not present

## 2020-03-26 DIAGNOSIS — J4520 Mild intermittent asthma, uncomplicated: Secondary | ICD-10-CM | POA: Diagnosis not present

## 2020-03-26 DIAGNOSIS — F339 Major depressive disorder, recurrent, unspecified: Secondary | ICD-10-CM | POA: Diagnosis not present

## 2020-03-26 DIAGNOSIS — Z885 Allergy status to narcotic agent status: Secondary | ICD-10-CM | POA: Diagnosis not present

## 2020-03-26 DIAGNOSIS — E440 Moderate protein-calorie malnutrition: Secondary | ICD-10-CM | POA: Diagnosis not present

## 2020-03-26 DIAGNOSIS — Z888 Allergy status to other drugs, medicaments and biological substances status: Secondary | ICD-10-CM | POA: Diagnosis not present

## 2020-03-26 MED ORDER — ZIPRASIDONE MESYLATE 20 MG IM SOLR: 20.0000 mg | Freq: Once | INTRAMUSCULAR | Status: DC

## 2020-03-26 MED ORDER — HALOPERIDOL LACTATE 5 MG/ML IJ SOLN: 5.0000 mg | Freq: Once | INTRAMUSCULAR | Status: DC | PRN

## 2020-03-26 NOTE — Progress Notes (Signed)
[place] [facility] PROGRESS NOTE    Sherry Hicks  GEX:[id number] DOB: 1960/03/05 DOA: 01/27/2020 PCP: [name], DO      Brief Narrative:  Mrs. Babiarz is a 60 y.o. F with Alzheimer's dementia with behavioral disturbance who presented with progressively erratic behavior over months.  See longer summary from H&P on 2/9, but in brief, patient had been living in [place] for years until family were informed by neighbors that patient was wandering, appeared unbathed, and she moved to [facility].  Here, she lived with daughter for about 2 years, but in the last few months, she started to behave more impulsively, to at times seem aggressive and have verbal outbursts, severe insomnia, and so APS recommended the patient be brought to the ER.  In the ER, the patient was seen by Psychiatry and started on new Seroquel.  She was held pending geri-psych placement, which was delayed for several months.  Now stabilized and behaviors improved with Seroquel.           Assessment & Plan:  Dementia with behavioral disturbance Behaviors seem improved.  Patient tranquil, has had no outbursts requiring Haldol or Geodon administration in weeks, now that Seroquel dose has been titrated up.  Last PT note 2/5, unable to participate in rehab due to advanced dementia, but able to ambulate with 1 assist without difficulty.    -Continue Seroquel, hydroxyzine, gabapentin  -Repeat TSH, lipids, A1c (for metabolic syndrome monitoring on new Seroquel) pending next Monday   Moderate protein calorie malnutrition -Continue feeding supplements -Give ice cream or Magic cup twice daily  Transaminitis -Repeat LFTs pending next Monday       Disposition: Status is: Observation  The patient remains OBS appropriate and will d/c before 2 midnights.  Dispo: The patient is from: Home              Anticipated d/c is to: ALF              Anticipated d/c date is: > 3 days              Patient  currently is medically stable to d/c.   Difficult to place patient Yes       Level of care: Med-Surg       MDM: The below labs and imaging reports were reviewed and summarized above.  Medication management as above.   DVT prophylaxis: Low risk, and risk of harm from daily injections outweighs potential benefit  Code Status: FULL Family Communication: Attempted to reach daughter by phone, no answer          Subjective: No complaints.  Calm per nursing.    Objective: Vitals:   03/26/20 0623 03/26/20 1326 03/26/20 1414 03/26/20 1500  BP: (!) 137/99 97/72 112/66   Pulse: 86 100 100   Resp: 16 16 17    Temp: 98 F (36.7 C) 98.2 F (36.8 C) 98.5 F (36.9 C)   TempSrc: Oral Axillary Axillary   SpO2: 100% 100% 100%   Weight:    58.8 kg  Height:    5\' 6"  (1.676 m)    Intake/Output Summary (Last 24 hours) at 03/26/2020 1559 Last data filed at 03/26/2020 1300 Gross per 24 hour  Intake 1440 ml  Output --  Net 1440 ml   Filed Weights   03/26/20 1500  Weight: 58.8 kg    Examination: General appearance: Thin elderly adult female, alert and in no acute distress.   MSK: No deformities or effusions.Diffuse loss of subcutaneous  muscle mass and fat. Neuro: Awake and makes eye contact.  Content of speech is nonsensical.  Smiles.       Data Reviewed: I have personally reviewed following labs and imaging studies:  CBC: No results for input(s): WBC, NEUTROABS, HGB, HCT, MCV, PLT in the last 168 hours. Basic Metabolic Panel: No results for input(s): NA, K, CL, CO2, GLUCOSE, BUN, CREATININE, CALCIUM, MG, PHOS in the last 168 hours. GFR: CrCl cannot be calculated (Patient's most recent lab result is older than the maximum 21 days allowed.). Liver Function Tests: No results for input(s): AST, ALT, ALKPHOS, BILITOT, PROT, ALBUMIN in the last 168 hours. No results for input(s): LIPASE, AMYLASE in the last 168 hours. No results for input(s): AMMONIA in the last 168  hours. Coagulation Profile: No results for input(s): INR, PROTIME in the last 168 hours. Cardiac Enzymes: No results for input(s): CKTOTAL, CKMB, CKMBINDEX, TROPONINI in the last 168 hours. BNP (last 3 results) No results for input(s): PROBNP in the last 8760 hours. HbA1C: No results for input(s): HGBA1C in the last 72 hours. CBG: No results for input(s): GLUCAP in the last 168 hours. Lipid Profile: No results for input(s): CHOL, HDL, LDLCALC, TRIG, CHOLHDL, LDLDIRECT in the last 72 hours. Thyroid Function Tests: No results for input(s): TSH, T4TOTAL, FREET4, T3FREE, THYROIDAB in the last 72 hours. Anemia Panel: No results for input(s): VITAMINB12, FOLATE, FERRITIN, TIBC, IRON, RETICCTPCT in the last 72 hours. Urine analysis:    Component Value Date/Time   COLORURINE yellow 06/01/2008 1332   APPEARANCEUR Clear 06/01/2008 1332   LABSPEC 1.020 06/01/2008 1332   PHURINE 5.5 06/01/2008 1332   GLUCOSEU NEGATIVE 08/08/2007 1938   HGBUR negative 06/01/2008 1332   BILIRUBINUR negative 06/01/2008 1332   KETONESUR TRACE (A) 08/08/2007 1938   PROTEINUR NEGATIVE 08/08/2007 1938   UROBILINOGEN 0.2 06/01/2008 1332   NITRITE negative 06/01/2008 1332   LEUKOCYTESUR  08/08/2007 1938    NEGATIVE Biochemical Testing Only. Please order routine urinalysis from main lab if confirmatory testing is needed.   Sepsis Labs: @LABRCNTIP (procalcitonin:4,lacticacidven:4)  ) Recent Results (from the past 240 hour(s))  SARS CORONAVIRUS 2 (TAT 6-24 HRS) Nasopharyngeal Nasopharyngeal Swab     Status: None   Collection Time: 03/24/20  2:50 PM   Specimen: Nasopharyngeal Swab  Result Value Ref Range Status   SARS Coronavirus 2 NEGATIVE NEGATIVE Final    Comment: (NOTE) SARS-CoV-2 target nucleic acids are NOT DETECTED.  The SARS-CoV-2 RNA is generally detectable in upper and lower respiratory specimens during the acute phase of infection. Negative results do not preclude SARS-CoV-2 infection, do not rule  out co-infections with other pathogens, and should not be used as the sole basis for treatment or other patient management decisions. Negative results must be combined with clinical observations, patient history, and epidemiological information. The expected result is Negative.  Fact Sheet for Patients: 05/22/20  Fact Sheet for Healthcare Providers: HairSlick.no  This test is not yet approved or cleared by the [place] FDA and  has been authorized for detection and/or diagnosis of SARS-CoV-2 by FDA under an Emergency Use Authorization (EUA). This EUA will remain  in effect (meaning this test can be used) for the duration of the COVID-19 declaration under Se ction 564(b)(1) of the Act, 21 U.S.C. section 360bbb-3(b)(1), unless the authorization is terminated or revoked sooner.  Performed at [facility], [address] [place], [place]          Radiology Studies: No results found.  Scheduled Meds: . feeding supplement  237 mL Oral BID BM  . gabapentin  100 mg Oral BID  . hydrOXYzine  25 mg Oral TID  . QUEtiapine  100 mg Oral BID  . QUEtiapine  150 mg Oral QHS   Continuous Infusions:   LOS: 0 days    Time spent: 15 minutes    Alberteen Sam, MD [facility] 03/26/2020, 3:59 PM     Please page though AMION or Epic secure chat:  For [employer], Higher education careers adviser

## 2020-03-26 NOTE — ED Notes (Signed)
Pt off unit to [facility]. Pt alert, no s/s of distress. Report given to RN. Pt off unit on stretcher.  Pt transported by [facility].

## 2020-03-26 NOTE — Progress Notes (Signed)
NEW ADMISSION NOTE New Admission Note:   Arrival Method: stretcher Mental Orientation: A&O X1 self Telemetry:n/a Assessment: Completed Skin: intact dry, cracked heels IV: n/a Pain: 0/10 Tubes: n/a Safety Measures: Safety Fall Prevention Plan has been given, discussed and signed Admission: Completed [facility] Orientation: Patient has been orientated to the room, unit and staff.  Family: none at bedside  Orders have been reviewed and implemented. Will continue to monitor the patient. Call light has been placed within reach and bed alarm has been activated.   Teagen Mcleary S Adithya Difrancesco, RN

## 2020-03-26 NOTE — Progress Notes (Addendum)
CSW completed chart review and completed new FL2.  CSW attempted to reach admissions at [facility] in [place] - a message was left requesting a return call.  CSW spoke with patient's daughter Sherry Hicks at [phone] to complete discussion. CSW informed daughter of transfer to [facility] from [facility] ED. CSW explained to [place] what DTP was and the staff involved. Sherry Hicks agreeable for placement in a locked unit at SNF or memory care facility - no strong preference for geographic location.   CSW to continue following for discharge planning.  Edwin Dada, MSW, LCSW Transitions of Care  Clinical Social Worker II [phone]

## 2020-03-26 NOTE — NC FL2 (Signed)
[place] MEDICAID FL2 LEVEL OF CARE SCREENING TOOL     IDENTIFICATION  Patient Name: Sherry Hicks Birthdate: 18-Mar-1960 Sex: female Admission Date (Current Location): 01/27/2020  [place] and [place] Number:  [name] [id number] [facility] Facility and Address:  [facility], [address], [place], [place] [address]      Provider Number: [id number]  Attending Physician Name and Address:  Alberteen Sam, *  Relative Name and Phone Number:  Prerna Harold [phone]    Current Level of Care: Hospital Recommended Level of Care: Skilled Nursing [facility] Prior Approval Number:    Date Approved/Denied: 03/26/20 PASRR Number: [id number]  Discharge Plan: Other (Comment) (To be determined)    Current Diagnoses: Patient Active Problem List   Diagnosis Date Noted  . Dementia with behavioral disturbance (HCC) 03/24/2020  . History of mood disorder 08/29/2019  . Apraxia 08/29/2019  . Alteration in performance of activities of daily living 08/29/2019  . Caregiver stress 08/29/2019  . Early Onset Dementia with behavioral problem (HCC)   . Hyperlipidemia 07/30/2019  . Weight loss, unintentional 07/06/2019  . Hypernatremia 07/06/2019  . History of asthma, mild, intermittent 2017  . Major depressive disorder, recurrent episode (HCC) 04/12/2006  . HYPERTENSION, BENIGN SYSTEMIC 04/12/2006    Orientation RESPIRATION BLADDER Height & Weight      (Not oriented)  Normal Incontinent Weight:   Height:     BEHAVIORAL SYMPTOMS/MOOD NEUROLOGICAL BOWEL NUTRITION STATUS      Incontinent Diet (Finger foods)  AMBULATORY STATUS COMMUNICATION OF NEEDS Skin   Extensive Assist Verbally Normal                       Personal Care Assistance Level of Assistance  Bathing,Feeding,Dressing Bathing Assistance: Maximum assistance Feeding assistance: Maximum assistance Dressing Assistance: Maximum assistance     Functional Limitations Info   Sight,Hearing,Speech Sight Info: Adequate Hearing Info: Adequate Speech Info: Adequate    SPECIAL CARE FACTORS FREQUENCY  OT (By licensed OT),PT (By licensed PT) Blood Pressure Frequency: 5x weekly   PT Frequency: 5x weekly OT Frequency: 5x weekly            Contractures Contractures Info: Not present    Additional Factors Info  Code Status,Allergies,Psychotropic Code Status Info: Full Code Allergies Info: Hydrocodone-acetaminophen and Donepezil Psychotropic Info: Vistaril, Seroquel, Geodon ; PRN Ativan         Current Medications (03/26/2020):  This is the current hospital active medication list Current Facility-Administered Medications  Medication Dose Route Frequency Provider Last Rate Last Admin  . acetaminophen (TYLENOL) tablet 500 mg  500 mg Oral Q6H PRN Alberteen Sam, MD   500 mg at 03/04/20 1927  . feeding supplement (ENSURE ENLIVE / ENSURE PLUS) liquid 237 mL  237 mL Oral BID BM Alberteen Sam, MD   237 mL at 03/26/20 0923  . gabapentin (NEURONTIN) capsule 100 mg  100 mg Oral BID Alberteen Sam, MD   100 mg at 03/26/20 5883  . hydrOXYzine (ATARAX/VISTARIL) tablet 25 mg  25 mg Oral TID Alberteen Sam, MD   25 mg at 03/26/20 0923  . QUEtiapine (SEROQUEL) tablet 100 mg  100 mg Oral BID Alberteen Sam, MD   100 mg at 03/26/20 2549  . QUEtiapine (SEROQUEL) tablet 150 mg  150 mg Oral QHS Alberteen Sam, MD   150 mg at 03/25/20 2013  . QUEtiapine (SEROQUEL) tablet 50 mg  50 mg Oral BID PRN Danford, Earl Lites, MD  50 mg at 03/08/20 1205  . ziprasidone (GEODON) injection 20 mg  20 mg Intramuscular Once Terrilee Files, MD         Discharge Medications: Please see discharge summary for a list of discharge medications.  Relevant Imaging Results:  Relevant Lab Results:   Additional Information SSN#:  [id number]  Inis Sizer, LCSW

## 2020-03-26 NOTE — Progress Notes (Signed)
TOC CM/CSW was faxed out to the below:  [facility] Preferred SNF  Pending - Request Sent N/A [address], [place] [address] [phone]    CSW will continue to follow for dc needs.Gennie Alma Tarpley-Carter, MSW, LCSW-A Pronouns:  She, Her, Hers                  [facility] Transitions of CareClinical Social Worker [email or web address] [phone]

## 2020-03-27 DIAGNOSIS — Z8709 Personal history of other diseases of the respiratory system: Secondary | ICD-10-CM | POA: Diagnosis not present

## 2020-03-27 DIAGNOSIS — F0281 Dementia in other diseases classified elsewhere with behavioral disturbance: Secondary | ICD-10-CM | POA: Diagnosis not present

## 2020-03-27 DIAGNOSIS — G309 Alzheimer's disease, unspecified: Secondary | ICD-10-CM | POA: Diagnosis not present

## 2020-03-27 NOTE — Progress Notes (Signed)
Secured chatted provider, patient very agitated yell at nothing saying get out of my room you are not going to take my baby, I am calling the police, no one is in the room with her, no staff is in the room.  Staff is right outside the room.

## 2020-03-27 NOTE — Progress Notes (Signed)
PROGRESS NOTE    Sherry Hicks  XBD:[id number] DOB: 06/05/1960 DOA: 01/27/2020 PCP: [name], DO    Brief Narrative:  60 y.o. F with Alzheimer's dementia with behavioral disturbance who presented with progressively erratic behavior over months.  See longer summary from H&P on 2/9, but in brief, patient had been living in [place] for years until family were informed by neighbors that patient was wandering, appeared unbathed, and she moved to [facility].  Here, she lived with daughter for about 2 years, but in the last few months, she started to behave more impulsively, to at times seem aggressive and have verbal outbursts, severe insomnia, and so APS recommended the patient be brought to the ER.  In the ER, the patient was seen by Psychiatry and started on new Seroquel.  She was held pending geri-psych placement, which was delayed for several months.  Now stabilized and behaviors improved with Seroquel.   Assessment & Plan:   Principal Problem:   Dementia with behavioral disturbance (HCC) Active Problems:   Major depressive disorder, recurrent episode (HCC)   History of asthma, mild, intermittent   Early Onset Dementia with behavioral problem (HCC)  Dementia with behavioral disturbance Behaviors seem improved.  Patient tranquil, has had no outbursts requiring Haldol or Geodon administration in weeks, now that Seroquel dose has been titrated up.  Last PT note 2/5, unable to participate in rehab due to advanced dementia, but able to ambulate with 1 assist without difficulty.    -Continue Seroquel, hydroxyzine, gabapentin  -Repeat TSH, lipids, A1c (for metabolic syndrome monitoring on new Seroquel) has been ordered for 2/14   Moderate protein calorie malnutrition -Continue feeding supplements -Give ice cream or Magic cup twice daily  Transaminitis -Repeat LFTs pending next Monday   DVT prophylaxis: Low risk, and risk of harm from daily injections outweighs  potential benefit Code Status: Full Family Communication: Pt in room, family not at bedside  Status is: Observation  The patient remains OBS appropriate and will d/c before 2 midnights.  Dispo: The patient is from: Home              Anticipated d/c is to: SNF              Anticipated d/c date is: > 3 days              Patient currently is medically stable to d/c. Just pending placement   Difficult to place patient No   Consultants:     Procedures:     Antimicrobials: Anti-infectives (From admission, onward)   None       Subjective: Unable to determine given mentation  Objective: Vitals:   03/26/20 2244 03/27/20 0420 03/27/20 0941 03/27/20 1626  BP: (!) 103/46 108/73 110/68 101/64  Pulse: 81 62 68 66  Resp: 16 16 18 16   Temp: 99.4 F (37.4 C) (!) 97.4 F (36.3 C) 98 F (36.7 C) 97.6 F (36.4 C)  TempSrc: Oral Oral Oral Oral  SpO2: 100% 100% 100% 99%  Weight: 55.8 kg     Height:        Intake/Output Summary (Last 24 hours) at 03/27/2020 1629 Last data filed at 03/27/2020 1320 Gross per 24 hour  Intake 840 ml  Output 0 ml  Net 840 ml   Filed Weights   03/26/20 1500 03/26/20 2244  Weight: 58.8 kg 55.8 kg    Examination: General exam: Asleep, laying in bed, in nad Respiratory system: Normal respiratory effort, no wheezing  Data Reviewed: I  have personally reviewed following labs and imaging studies  CBC: No results for input(s): WBC, NEUTROABS, HGB, HCT, MCV, PLT in the last 168 hours. Basic Metabolic Panel: No results for input(s): NA, K, CL, CO2, GLUCOSE, BUN, CREATININE, CALCIUM, MG, PHOS in the last 168 hours. GFR: CrCl cannot be calculated (Patient's most recent lab result is older than the maximum 21 days allowed.). Liver Function Tests: No results for input(s): AST, ALT, ALKPHOS, BILITOT, PROT, ALBUMIN in the last 168 hours. No results for input(s): LIPASE, AMYLASE in the last 168 hours. No results for input(s): AMMONIA in the last 168  hours. Coagulation Profile: No results for input(s): INR, PROTIME in the last 168 hours. Cardiac Enzymes: No results for input(s): CKTOTAL, CKMB, CKMBINDEX, TROPONINI in the last 168 hours. BNP (last 3 results) No results for input(s): PROBNP in the last 8760 hours. HbA1C: No results for input(s): HGBA1C in the last 72 hours. CBG: No results for input(s): GLUCAP in the last 168 hours. Lipid Profile: No results for input(s): CHOL, HDL, LDLCALC, TRIG, CHOLHDL, LDLDIRECT in the last 72 hours. Thyroid Function Tests: No results for input(s): TSH, T4TOTAL, FREET4, T3FREE, THYROIDAB in the last 72 hours. Anemia Panel: No results for input(s): VITAMINB12, FOLATE, FERRITIN, TIBC, IRON, RETICCTPCT in the last 72 hours. Sepsis Labs: No results for input(s): PROCALCITON, LATICACIDVEN in the last 168 hours.  Recent Results (from the past 240 hour(s))  SARS CORONAVIRUS 2 (TAT 6-24 HRS) Nasopharyngeal Nasopharyngeal Swab     Status: None   Collection Time: 03/24/20  2:50 PM   Specimen: Nasopharyngeal Swab  Result Value Ref Range Status   SARS Coronavirus 2 NEGATIVE NEGATIVE Final    Comment: (NOTE) SARS-CoV-2 target nucleic acids are NOT DETECTED.  The SARS-CoV-2 RNA is generally detectable in upper and lower respiratory specimens during the acute phase of infection. Negative results do not preclude SARS-CoV-2 infection, do not rule out co-infections with other pathogens, and should not be used as the sole basis for treatment or other patient management decisions. Negative results must be combined with clinical observations, patient history, and epidemiological information. The expected result is Negative.  Fact Sheet for Patients: HairSlick.no  Fact Sheet for Healthcare Providers: quierodirigir.com  This test is not yet approved or cleared by the [place] FDA and  has been authorized for detection and/or diagnosis of SARS-CoV-2  by FDA under an Emergency Use Authorization (EUA). This EUA will remain  in effect (meaning this test can be used) for the duration of the COVID-19 declaration under Se ction 564(b)(1) of the Act, 21 U.S.C. section 360bbb-3(b)(1), unless the authorization is terminated or revoked sooner.  Performed at [facility], [address], [place], [place] [address]      Radiology Studies: No results found.  Scheduled Meds: . feeding supplement  237 mL Oral BID BM  . gabapentin  100 mg Oral BID  . hydrOXYzine  25 mg Oral TID  . QUEtiapine  100 mg Oral BID  . QUEtiapine  150 mg Oral QHS   Continuous Infusions:   LOS: 0 days   Rickey Barbara, MD [facility] Pager On Amion  If 7PM-7AM, please contact night-coverage 03/27/2020, 4:29 PM

## 2020-03-27 NOTE — Evaluation (Signed)
Physical Therapy Evaluation & Discharge Patient Details Name: Sherry Hicks MRN: [id number] DOB: 08/06/1960 Today's Date: 03/27/2020   History of Present Illness  Pt is a 60 y.o. female admitted 01/27/20 with progressively erratic behavior; workup for dementia with behavioral disturbance, malnutrition. PMH includes Alzheimer's dementia, asthma.    Clinical Impression  Patient evaluated by Physical Therapy with no further acute PT needs identified. PTA, pt living with daughter with worsening cognitive impairments. Today, pt pleasantly confused, mobilizing well with intermittent minA to prevent LOB. Difficulty directing pt to follow commands or complete tasks; pt only oriented to self and talking about people not in room and events not occurring. Pt would benefit from more frequent mobility with nursing staff if they are able to orient/redirect pt to do so. No acute PT needs identified, will sign off. Thank you for this referral.    Follow Up Recommendations Supervision for mobility/OOB (ALF/memory care)    Equipment Recommendations  None recommended by PT    Recommendations for Other Services       Precautions / Restrictions Precautions Precautions: Fall Restrictions Weight Bearing Restrictions: No      Mobility  Bed Mobility Overal bed mobility: Independent Bed Mobility: Supine to Sit;Sit to Supine           General bed mobility comments: Indep with supine<>sit with bed flat; requires significant increased time and repeated cues to comprehend and complete task    Transfers Overall transfer level: Needs assistance Equipment used: None Transfers: Sit to/from Stand Sit to Stand: Supervision         General transfer comment: Supervision for safety due to fall risk  Ambulation/Gait Ambulation/Gait assistance: Min guard;Min assist Gait Distance (Feet): 6 Feet Assistive device: None Gait Pattern/deviations: Step-through pattern;Decreased stride length;Trunk  flexed Gait velocity: Decreased   General Gait Details: Ambulating short distance in room with min guard for balance, 1x LOB requiring minA to prevent fall; pt will stop and look around requiring max verbal/tactile cues to redirect to task; difficulty progressing gait secondary to this  Stairs            Wheelchair Mobility    Modified Rankin (Stroke Patients Only)       Balance Overall balance assessment: Needs assistance Sitting-balance support: Feet supported;No upper extremity supported Sitting balance-Leahy Scale: Good     Standing balance support: During functional activity;No upper extremity supported Standing balance-Leahy Scale: Fair                               Pertinent Vitals/Pain Pain Assessment: Faces Faces Pain Scale: No hurt Pain Intervention(s): Monitored during session    Home Living Family/patient expects to be discharged to:: Private residence Living Arrangements: Children Available Help at Discharge: Family             Additional Comments: Info rfrom chart, pt not answering questions appropriately    Prior Function                 Hand Dominance        Extremity/Trunk Assessment   Upper Extremity Assessment Upper Extremity Assessment: Generalized weakness    Lower Extremity Assessment Lower Extremity Assessment: Generalized weakness       Communication      Cognition Arousal/Alertness: Awake/alert Behavior During Therapy: WFL for tasks assessed/performed Overall Cognitive Status: History of cognitive impairments - at baseline Area of Impairment: Orientation;Attention;Memory;Following commands;Safety/judgement;Awareness;Problem solving  Orientation Level: Disoriented to;Place;Time;Situation Current Attention Level: Focused;Sustained Memory: Decreased short-term memory Following Commands: Follows one step commands inconsistently Safety/Judgement: Decreased awareness of  safety;Decreased awareness of deficits Awareness: Intellectual Problem Solving: Slow processing;Decreased initiation;Difficulty sequencing;Requires verbal cues General Comments: H/o Alzheimer's dementia. Pt very pleasantly confused; not able to follow commands consistently requiring frequent verbal/tactile cues and redirection to task; "my mom keeps calling and I just had to tell her I'm ok" - thinks she is in [place], [place]      General Comments      Exercises     Assessment/Plan    PT Assessment Patent does not need any further PT services  PT Problem List         PT Treatment Interventions      PT Goals (Current goals can be found in the Care Plan section)  Acute Rehab PT Goals PT Goal Formulation: All assessment and education complete, DC therapy    Frequency     Barriers to discharge        Co-evaluation               AM-PAC PT "6 Clicks" Mobility  Outcome Measure Help needed turning from your back to your side while in a flat bed without using bedrails?: None Help needed moving from lying on your back to sitting on the side of a flat bed without using bedrails?: None Help needed moving to and from a bed to a chair (including a wheelchair)?: A Little Help needed standing up from a chair using your arms (e.g., wheelchair or bedside chair)?: A Little Help needed to walk in hospital room?: A Little Help needed climbing 3-5 steps with a railing? : A Little 6 Click Score: 20    End of Session Equipment Utilized During Treatment: Gait belt Activity Tolerance: Patient tolerated treatment well Patient left: in bed;with call bell/phone within reach;with nursing/sitter in room;with bed alarm set Nurse Communication: Mobility status PT Visit Diagnosis: Unsteadiness on feet (R26.81);Difficulty in walking, not elsewhere classified (R26.2)    Time: 9476-5465 PT Time Calculation (min) (ACUTE ONLY): 18 min   Charges:   PT Evaluation $PT Eval Moderate Complexity: 1  Mod     Ina Homes, PT, DPT Acute Rehabilitation Services  Pager [phone] Office [phone]  Malachy Chamber 03/27/2020, 9:26 AM

## 2020-03-27 NOTE — Plan of Care (Signed)
  Problem: Nutrition: Goal: Adequate nutrition will be maintained Outcome: Progressing   Problem: Coping: Goal: Level of anxiety will decrease Outcome: Not Progressing   

## 2020-03-27 NOTE — Progress Notes (Signed)
Patient babbling and talking to people not in the room, seeing children in the room.  Refused to let NT and RN remove wet sheets and clean her.  Yelled stop.  Then returned to talking about taking care of everyone there.

## 2020-03-27 NOTE — Progress Notes (Signed)
Patient agitation continuing to increase and is talking to what she is seeing, yelling, saying that no one is coming in here asking where did it fall.  Messing wiith covers trying to make it "even"

## 2020-03-28 DIAGNOSIS — F0281 Dementia in other diseases classified elsewhere with behavioral disturbance: Secondary | ICD-10-CM | POA: Diagnosis not present

## 2020-03-28 DIAGNOSIS — G309 Alzheimer's disease, unspecified: Secondary | ICD-10-CM | POA: Diagnosis not present

## 2020-03-28 MED ORDER — HALOPERIDOL LACTATE 5 MG/ML IJ SOLN: 5.0000 mg | Freq: Four times a day (QID) | INTRAMUSCULAR | Status: DC | PRN

## 2020-03-28 MED ORDER — HALOPERIDOL 5 MG PO TABS
5.0000 mg | ORAL_TABLET | Freq: Four times a day (QID) | ORAL | Status: DC | PRN
  Administered 2020-03-28: 5 mg via ORAL
  Filled 2020-03-28 (×2): qty 1

## 2020-03-28 NOTE — Progress Notes (Signed)
PROGRESS NOTE    Sherry Hicks  TFT:[id number] DOB: July 29, 1960 DOA: 01/27/2020 PCP: [name], DO    Brief Narrative:  60 y.o. F with Alzheimer's dementia with behavioral disturbance who presented with progressively erratic behavior over months.  See longer summary from H&P on 2/9, but in brief, patient had been living in [place] for years until family were informed by neighbors that patient was wandering, appeared unbathed, and she moved to [facility]  Here, she lived with daughter for about 2 years, but in the last few months, she started to behave more impulsively, to at times seem aggressive and have verbal outbursts, severe insomnia, and so APS recommended the patient be brought to the ER.  In the ER, the patient was seen by Psychiatry and started on new Seroquel.  She was held pending geri-psych placement, which was delayed for several months.  Now stabilized and behaviors improved with Seroquel.   Assessment & Plan:   Principal Problem:   Dementia with behavioral disturbance (HCC) Active Problems:   Major depressive disorder, recurrent episode (HCC)   History of asthma, mild, intermittent   Early Onset Dementia with behavioral problem (HCC)  Dementia with behavioral disturbance Behaviors seem improved.  Patient tranquil, has had no outbursts requiring Haldol or Geodon administration in weeks, now that Seroquel dose has been titrated up.  Last PT note 2/5, unable to participate in rehab due to advanced dementia, but able to ambulate with 1 assist without difficulty.    -Continue Seroquel, hydroxyzine, gabapentin  -Repeat TSH, lipids, A1c (for metabolic syndrome monitoring on new Seroquel) was ordered for 2/14   Moderate protein calorie malnutrition -Continue feeding supplements -Give ice cream or Magic cup twice daily  Transaminitis -Repeat LFTs pending next Monday   DVT prophylaxis: Low risk, and risk of harm from daily injections outweighs  potential benefit Code Status: Full Family Communication: Pt in room, family not at bedside  Status is: Observation  The patient remains OBS appropriate and will d/c before 2 midnights.  Dispo: The patient is from: Home              Anticipated d/c is to: SNF              Anticipated d/c date is: > 3 days              Patient currently is medically stable to d/c. Just pending placement   Difficult to place patient No   Consultants:     Procedures:     Antimicrobials: Anti-infectives (From admission, onward)   None      Subjective: Pleasantly confused  Objective: Vitals:   03/27/20 1626 03/27/20 2104 03/28/20 0457 03/28/20 0946  BP: 101/64 (!) 112/59 107/71 101/71  Pulse: 66 86 (!) 57 62  Resp: 16   18  Temp: 97.6 F (36.4 C) 98.4 F (36.9 C) (!) 97.5 F (36.4 C) 98 F (36.7 C)  TempSrc: Oral Oral Oral Oral  SpO2: 99% 99% 100% 98%  Weight:      Height:        Intake/Output Summary (Last 24 hours) at 03/28/2020 1457 Last data filed at 03/28/2020 1100 Gross per 24 hour  Intake 360 ml  Output 0 ml  Net 360 ml   Filed Weights   03/26/20 1500 03/26/20 2244  Weight: 58.8 kg 55.8 kg    Examination: General exam: Conversant, in no acute distress Respiratory system: normal chest rise, clear, no audible wheezing  Data Reviewed: I have personally reviewed following  labs and imaging studies  CBC: No results for input(s): WBC, NEUTROABS, HGB, HCT, MCV, PLT in the last 168 hours. Basic Metabolic Panel: No results for input(s): NA, K, CL, CO2, GLUCOSE, BUN, CREATININE, CALCIUM, MG, PHOS in the last 168 hours. GFR: CrCl cannot be calculated (Patient's most recent lab result is older than the maximum 21 days allowed.). Liver Function Tests: No results for input(s): AST, ALT, ALKPHOS, BILITOT, PROT, ALBUMIN in the last 168 hours. No results for input(s): LIPASE, AMYLASE in the last 168 hours. No results for input(s): AMMONIA in the last 168 hours. Coagulation  Profile: No results for input(s): INR, PROTIME in the last 168 hours. Cardiac Enzymes: No results for input(s): CKTOTAL, CKMB, CKMBINDEX, TROPONINI in the last 168 hours. BNP (last 3 results) No results for input(s): PROBNP in the last 8760 hours. HbA1C: No results for input(s): HGBA1C in the last 72 hours. CBG: No results for input(s): GLUCAP in the last 168 hours. Lipid Profile: No results for input(s): CHOL, HDL, LDLCALC, TRIG, CHOLHDL, LDLDIRECT in the last 72 hours. Thyroid Function Tests: No results for input(s): TSH, T4TOTAL, FREET4, T3FREE, THYROIDAB in the last 72 hours. Anemia Panel: No results for input(s): VITAMINB12, FOLATE, FERRITIN, TIBC, IRON, RETICCTPCT in the last 72 hours. Sepsis Labs: No results for input(s): PROCALCITON, LATICACIDVEN in the last 168 hours.  Recent Results (from the past 240 hour(s))  SARS CORONAVIRUS 2 (TAT 6-24 HRS) Nasopharyngeal Nasopharyngeal Swab     Status: None   Collection Time: 03/24/20  2:50 PM   Specimen: Nasopharyngeal Swab  Result Value Ref Range Status   SARS Coronavirus 2 NEGATIVE NEGATIVE Final    Comment: (NOTE) SARS-CoV-2 target nucleic acids are NOT DETECTED.  The SARS-CoV-2 RNA is generally detectable in upper and lower respiratory specimens during the acute phase of infection. Negative results do not preclude SARS-CoV-2 infection, do not rule out co-infections with other pathogens, and should not be used as the sole basis for treatment or other patient management decisions. Negative results must be combined with clinical observations, patient history, and epidemiological information. The expected result is Negative.  Fact Sheet for Patients: HairSlick.no  Fact Sheet for Healthcare Providers: quierodirigir.com  This test is not yet approved or cleared by the [place] FDA and  has been authorized for detection and/or diagnosis of SARS-CoV-2 by FDA under an  Emergency Use Authorization (EUA). This EUA will remain  in effect (meaning this test can be used) for the duration of the COVID-19 declaration under Se ction 564(b)(1) of the Act, 21 U.S.C. section 360bbb-3(b)(1), unless the authorization is terminated or revoked sooner.  Performed at [facility], [address] [place], [place] [address]      Radiology Studies: No results found.  Scheduled Meds: . feeding supplement  237 mL Oral BID BM  . gabapentin  100 mg Oral BID  . hydrOXYzine  25 mg Oral TID  . QUEtiapine  100 mg Oral BID  . QUEtiapine  150 mg Oral QHS   Continuous Infusions:   LOS: 0 days   Rickey Barbara, MD [facility] Pager On Amion  If 7PM-7AM, please contact night-coverage 03/28/2020, 2:57 PM

## 2020-03-28 NOTE — Plan of Care (Signed)
  Problem: Nutrition: Goal: Adequate nutrition will be maintained Outcome: Progressing   Problem: Elimination: Goal: Will not experience complications related to urinary retention Outcome: Progressing   

## 2020-03-28 NOTE — Progress Notes (Signed)
When trying to clean patient up and change bed where had urinated, patient became combative and aggressive, yelling, "dont touch my stuff, get away from me".  Staff able to get bed changed and patient cleaned up and settled back in bed and patient calmed dpwn.

## 2020-03-29 DIAGNOSIS — E440 Moderate protein-calorie malnutrition: Secondary | ICD-10-CM

## 2020-03-29 DIAGNOSIS — R7401 Elevation of levels of liver transaminase levels: Secondary | ICD-10-CM

## 2020-03-29 DIAGNOSIS — L89212 Pressure ulcer of right hip, stage 2: Secondary | ICD-10-CM | POA: Diagnosis not present

## 2020-03-29 DIAGNOSIS — F0391 Unspecified dementia with behavioral disturbance: Secondary | ICD-10-CM | POA: Diagnosis not present

## 2020-03-29 LAB — COMPREHENSIVE METABOLIC PANEL
ALT: 31 U/L (ref 0–44)
AST: 20 U/L (ref 15–41)
Albumin: 2.9 g/dL — ABNORMAL LOW (ref 3.5–5.0)
Alkaline Phosphatase: 64 U/L (ref 38–126)
Anion gap: 9 (ref 5–15)
BUN: 20 mg/dL (ref 6–20)
CO2: 29 mmol/L (ref 22–32)
Calcium: 8.8 mg/dL — ABNORMAL LOW (ref 8.9–10.3)
Chloride: 102 mmol/L (ref 98–111)
Creatinine, Ser: 0.88 mg/dL (ref 0.44–1.00)
GFR, Estimated: >60 mL/min (ref >60)
Glucose, Bld: 170 mg/dL — ABNORMAL HIGH (ref 70–99)
Potassium: 4.4 mmol/L (ref 3.5–5.1)
Sodium: 140 mmol/L (ref 135–145)
Total Bilirubin: 0.7 mg/dL (ref 0.3–1.2)
Total Protein: 5.4 g/dL — ABNORMAL LOW (ref 6.5–8.1)

## 2020-03-29 LAB — LIPID PANEL
Cholesterol: 192 mg/dL (ref 0–200)
HDL: 75 mg/dL (ref >40)
LDL Cholesterol: 111 mg/dL — ABNORMAL HIGH (ref 0–99)
Total CHOL/HDL Ratio: 2.6 RATIO
Triglycerides: 30 mg/dL (ref <150)
VLDL: 6 mg/dL (ref 0–40)

## 2020-03-29 LAB — CBC WITH DIFFERENTIAL/PLATELET
Abs Immature Granulocytes: 0.01 10*3/uL (ref 0.00–0.07)
Basophils Absolute: 0 10*3/uL (ref 0.0–0.1)
Basophils Relative: 1 %
Eosinophils Absolute: 0 10*3/uL (ref 0.0–0.5)
Eosinophils Relative: 1 %
HCT: 36.7 % (ref 36.0–46.0)
Hemoglobin: 11.6 g/dL — ABNORMAL LOW (ref 12.0–15.0)
Immature Granulocytes: 0 %
Lymphocytes Relative: 41 %
Lymphs Abs: 1.5 10*3/uL (ref 0.7–4.0)
MCH: 31.5 pg (ref 26.0–34.0)
MCHC: 31.6 g/dL (ref 30.0–36.0)
MCV: 99.7 fL (ref 80.0–100.0)
Monocytes Absolute: 0.2 10*3/uL (ref 0.1–1.0)
Monocytes Relative: 4 %
Neutro Abs: 1.9 10*3/uL (ref 1.7–7.7)
Neutrophils Relative %: 53 %
Platelets: 277 10*3/uL (ref 150–400)
RBC: 3.68 MIL/uL — ABNORMAL LOW (ref 3.87–5.11)
RDW: 12.5 % (ref 11.5–15.5)
WBC: 3.6 10*3/uL — ABNORMAL LOW (ref 4.0–10.5)
nRBC: 0 % (ref 0.0–0.2)

## 2020-03-29 LAB — TSH: TSH: 2.376 u[IU]/mL (ref 0.350–4.500)

## 2020-03-29 LAB — T4, FREE: Free T4: 0.75 ng/dL (ref 0.61–1.12)

## 2020-03-29 MED ORDER — CITALOPRAM HYDROBROMIDE 20 MG PO TABS
10.0000 mg | ORAL_TABLET | Freq: Every day | ORAL | Status: DC
Start: 2020-03-29 — End: 2020-04-05
  Administered 2020-03-29 – 2020-04-05 (×7): 10 mg via ORAL
  Filled 2020-03-29 (×3): qty 1
  Filled 2020-03-29: qty 0.5
  Filled 2020-03-29 (×4): qty 1

## 2020-03-29 MED ORDER — CARBAMAZEPINE 200 MG PO TABS
200.0000 mg | ORAL_TABLET | Freq: Four times a day (QID) | ORAL | Status: DC
Start: 2020-03-29 — End: 2020-03-31
  Administered 2020-03-29 – 2020-03-31 (×7): 200 mg via ORAL
  Filled 2020-03-29 (×8): qty 1

## 2020-03-29 NOTE — Evaluation (Signed)
Occupational Therapy Evaluation Patient Details Name: Sherry Hicks MRN: [id number] DOB: November 16, 1960 Today's Date: 03/29/2020    History of Present Illness Pt is a 60 y.o. female admitted 01/27/20 with progressively erratic behavior; workup for dementia with behavioral disturbance, malnutrition. PMH includes Alzheimer's dementia, asthma.   Clinical Impression   Pt in bed upon arrival talking to he cousin (cousing not present or on the phone). Pt refused and EOB and OOB mobility/activity. Pt agreeable to answering some questions although mot giving appropriate answers 75% of time. Pt not following instructions for simple ADLs tasks. Pt handed wash cloth and was unable to comprehend how to use it. Per pt's NT, pt unable to understand instructions for ADLs and refuses to allow pericare/hygiene and requires total A. Pt not appropriate for OT services at this time and no further acute OT needs are indicated. OT will sign off    Follow Up Recommendations  Supervision/Assistance - 24 hour (LTC/memory care vs Geri psych)    Equipment Recommendations  None recommended by OT    Recommendations for Other Services       Precautions / Restrictions Precautions Precautions: Fall Precaution Comments: requires redirection, very easily distractable Restrictions Weight Bearing Restrictions: No      Mobility Bed Mobility               General bed mobility comments: pt refused to sit EOB. Per PT note, pt able to sit EOB with Sup    Transfers                 General transfer comment: pt refused OOB mobility/activity. Per PT note, pt is Sup with sit - stand and mobility    Balance       Sitting balance - Comments: pt refuses EOB and OOB activity                                   ADL either performed or assessed with clinical judgement   ADL                                         General ADL Comments: Pt not following instructions for simple  ADLs tasks. Pt handed wash cloth and was unable to comprehend how to use it. Per pt's NT, pt unable to understand instructions for ADLs and refuses to allow pericare/hygiene and requires total A     Vision Patient Visual Report: No change from baseline       Perception     Praxis      Pertinent Vitals/Pain Pain Assessment: No/denies pain Faces Pain Scale: No hurt Pain Intervention(s): Monitored during session     Hand Dominance Right   Extremity/Trunk Assessment Upper Extremity Assessment Upper Extremity Assessment: Generalized weakness   Lower Extremity Assessment Lower Extremity Assessment: Defer to PT evaluation   Cervical / Trunk Assessment Cervical / Trunk Assessment: Normal   Communication Communication Communication: No difficulties   Cognition Arousal/Alertness: Awake/alert Behavior During Therapy: Restless Overall Cognitive Status: History of cognitive impairments - at baseline Area of Impairment: Orientation;Attention;Memory;Following commands;Safety/judgement;Awareness;Problem solving                 Orientation Level: Disoriented to;Place;Time;Situation   Memory: Decreased short-term memory Following Commands: Follows one step commands inconsistently Safety/Judgement: Decreased awareness of safety;Decreased awareness of deficits   Problem  Solving: Slow processing;Decreased initiation;Difficulty sequencing;Requires verbal cues General Comments: H/o Alzheimer's dementia. Pt very pleasantly confused; not able to follow commands consistently requiring frequent verbal/tactile cues and redirection for tasks/commands - thinks she is in [place], [place]   General Comments       Exercises     Shoulder Instructions      Home Living Family/patient expects to be discharged to:: Private residence Living Arrangements: Children Available Help at Discharge: Family                             Additional Comments: info obtained from chart, pt  not answering questions appropriately      Prior Functioning/Environment          Comments: unknowb at this time, pt unable to answer questions appropriately.        OT Problem List: Decreased activity tolerance;Decreased cognition;Decreased safety awareness      OT Treatment/Interventions:      OT Goals(Current goals can be found in the care plan section) Acute Rehab OT Goals Patient Stated Goal: none stated  OT Frequency:     Barriers to D/C:            Co-evaluation              AM-PAC OT "6 Clicks" Daily Activity     Outcome Measure Help from another person eating meals?: None Help from another person taking care of personal grooming?: Total Help from another person toileting, which includes using toliet, bedpan, or urinal?: Total Help from another person bathing (including washing, rinsing, drying)?: Total Help from another person to put on and taking off regular upper body clothing?: Total Help from another person to put on and taking off regular lower body clothing?: Total 6 Click Score: 9   End of Session    Activity Tolerance: Other (comment) (cognition/behavior) Patient left:    OT Visit Diagnosis: Other symptoms and signs involving cognitive function;Muscle weakness (generalized) (M62.81)                Time: 9798-9211 OT Time Calculation (min): 16 min Charges:  OT General Charges $OT Visit: 1 Visit OT Evaluation $OT Eval Moderate Complexity: 1 Mod   Galen Manila 03/29/2020, 1:43 PM

## 2020-03-29 NOTE — Progress Notes (Addendum)
1:20pm: CSW attempted to reach admissions at [facility] without success - no voicemail option available.  8am: CSW spoke with Whitney Post at [facility] to request an additional review - facility not able to offer a bed at this time.  CSW resent clinicals to [facility] to request a review - CSW will follow up with admissions.  CSW was informed by [facility] Director that Tammy from [facility] would be coming to visit the patient for an interview on Wednesday 2/16 for possible admission to the [facility] in [place].  Edwin Dada, MSW, LCSW Transitions of Care  Clinical Social Worker II [phone]

## 2020-03-29 NOTE — Progress Notes (Addendum)
[facility] PROGRESS NOTE  ORCHID GLASSBERG GMW:[id number] DOB: [birth year] DOA: 01/27/2020 PCP: [name], DO    03/29/2020   Status: The patient remains OBS appropriate and will d/c before 2 midnights.  Patient does not meet criteria for inpatient but is difficult to place secondary to need for skilled nursing facility secondary to severe dementia.  Dispo: The patient is from: Home              Anticipated d/c is to: SNF              Anticipated d/c date is: > 3 days              Patient currently is not medically stable to d/c.  Barriers to discharge: Need to completely stabilize dementia behavioral issues with psychotropic medications before stable to discharge.  Still awaiting SNF bed offer   Difficult to place patient Yes  Level of care: Med-Surg  Code Status: Full Family Communication: Patient only DVT prophylaxis: Ambulatory and low risk for DVT Vaccination status: ? If has received Covid vaccines  Foley catheter: No  HPI: 60 y.o.Fwith Alzheimer's dementia with behavioral disturbance who presented with progressively erratic behavior over months.  See longer summary from H&P on 2/9, but in brief, patient had been living in [place] for years until family were informed by neighbors that patient was wandering, appeared unbathed, and she moved to [facility]. Here, she lived with daughter for about 2 years, but in the last few months, she started to behave more impulsively, to at times seem aggressive and have verbal outbursts, severe insomnia, and so APS recommended the patient be brought to the ER.  In the ER, the patient was seen by Psychiatry and started on new Seroquel. She was held pending geri-psych placement, which was delayed for several months.  Daytime behaviors improved on high dose Seroquel but continues with sundowning behaviors. Added Celexa as mood stabilizer along with Tegretol on 2/14   Subjective: Patient awake and pleasant although quite  anxious.  With discussion patient did allow [place] to apply twelve-lead EKG leads and obtain necessary EKG.  She also allowed phlebotomy to obtain necessary labs.  She remains quite confused but currently is pleasant without any aggressive behaviors or statements.  Objective: Vitals:   03/28/20 2100 03/29/20 0610  BP: 103/77 116/78  Pulse: 70 (!) 58  Resp: 18   Temp: 98 F (36.7 C) (!) 97.5 F (36.4 C)  SpO2: 98% 100%    Intake/Output Summary (Last 24 hours) at 03/29/2020 0804 Last data filed at 03/29/2020 0601 Gross per 24 hour  Intake 360 ml  Output 0 ml  Net 360 ml   Filed Weights   03/26/20 1500 03/26/20 2244  Weight: 58.8 kg 55.8 kg    Exam:  Constitutional: NAD, calm when personal space invaded i.e. to place EKG electrodes she does become slightly restless, comfortable otherwise Respiratory: Clear to auscultation without any increased work of breathing, stable on room air Cardiovascular: Also is regular with EKG reading sinus rhythm, QTC normal at 444 ms, no peripheral edema and skin warm and dry Abdomen: no tenderness. Bowel sounds positive. LBM 2/11 Neurologic: CN 2-12 grossly intact. Sensation intact, Strength 5/5 x all 4 extremities.  Psychiatric: Awake and alert, oriented to name only.  Not to place, year or situation   Assessment/Plan: Acute problems: Dementia with behavioral disturbance Behaviors seem improved without behaviors that would require IM Geodon, Haldol or Seroquel prn Continue high-dose Seroquel-QTC checked on 2/14 less than  500. Continue Seroquel PO prn for breakthrough behavior Begin Celexa as mood stabilizer Begin Tegretol as adjunct to mood stabilizer and as an antianxiety agent Continue scheduled Vistaril for anxiety and continue gabapentin Last PT note 2/5, unable to participate in rehab due to advanced dementia, but able to ambulate with 1 assist without difficulty. This was prior to improvement in behaviors after Seroquel initiated therefore  on 2/14 have asked PT/OT to repeat evaluations Initial TSH slightly elevated with normal free T3 and T4-repeat TSH on 2/14 within normal limits with normal free T4 -Obtain CMET, lipid panel within normal limits except for slightly elevated LDL of 111, A1c(for metabolic syndrome monitoring on new Seroquel)  Other problems: Moderate protein calorie malnutrition -Continuefeeding supplements -Give ice cream or Magic cup twice daily  Transaminitis -Repeat LFTs ordered for 2/14 within normal limits   Data Reviewed: Basic Metabolic Panel: No results for input(s): NA, K, CL, CO2, GLUCOSE, BUN, CREATININE, CALCIUM, MG, PHOS in the last 168 hours. Liver Function Tests: No results for input(s): AST, ALT, ALKPHOS, BILITOT, PROT, ALBUMIN in the last 168 hours. No results for input(s): LIPASE, AMYLASE in the last 168 hours. No results for input(s): AMMONIA in the last 168 hours. CBC: No results for input(s): WBC, NEUTROABS, HGB, HCT, MCV, PLT in the last 168 hours. Cardiac Enzymes: No results for input(s): CKTOTAL, CKMB, CKMBINDEX, TROPONINI in the last 168 hours. BNP (last 3 results) No results for input(s): BNP in the last 8760 hours.  ProBNP (last 3 results) No results for input(s): PROBNP in the last 8760 hours.  CBG: No results for input(s): GLUCAP in the last 168 hours.  Recent Results (from the past 240 hour(s))  SARS CORONAVIRUS 2 (TAT 6-24 HRS) Nasopharyngeal Nasopharyngeal Swab     Status: None   Collection Time: 03/24/20  2:50 PM   Specimen: Nasopharyngeal Swab  Result Value Ref Range Status   SARS Coronavirus 2 NEGATIVE NEGATIVE Final    Comment: (NOTE) SARS-CoV-2 target nucleic acids are NOT DETECTED.  The SARS-CoV-2 RNA is generally detectable in upper and lower respiratory specimens during the acute phase of infection. Negative results do not preclude SARS-CoV-2 infection, do not rule out co-infections with other pathogens, and should not be used as the sole basis  for treatment or other patient management decisions. Negative results must be combined with clinical observations, patient history, and epidemiological information. The expected result is Negative.  Fact Sheet for Patients: HairSlick.no  Fact Sheet for Healthcare Providers: quierodirigir.com  This test is not yet approved or cleared by the [place] FDA and  has been authorized for detection and/or diagnosis of SARS-CoV-2 by FDA under an Emergency Use Authorization (EUA). This EUA will remain  in effect (meaning this test can be used) for the duration of the COVID-19 declaration under Se ction 564(b)(1) of the Act, 21 U.S.C. section 360bbb-3(b)(1), unless the authorization is terminated or revoked sooner.  Performed at [facility], [address] [place], [place] [address]      Studies: No results found.  Scheduled Meds: . carbamazepine  200 mg Oral Q6H  . citalopram  10 mg Oral Daily  . feeding supplement  237 mL Oral BID BM  . gabapentin  100 mg Oral BID  . hydrOXYzine  25 mg Oral TID  . QUEtiapine  100 mg Oral BID  . QUEtiapine  150 mg Oral QHS   Continuous Infusions:  Principal Problem:   Dementia with behavioral disturbance (HCC) Active Problems:   Major depressive  disorder, recurrent episode (HCC)   History of asthma, mild, intermittent   Early Onset Dementia with behavioral problem [facility])   Consultants:  Psychiatry  Procedures:  None  Antibiotics: Anti-infectives (From admission, onward)   None        Time spent: 30 minutes    Junious Silk ANP  [facility] 7 am - 330 pm/M-F for direct patient care and secure chat Please refer to Amion for contact info 0  days

## 2020-03-29 NOTE — Plan of Care (Signed)
  Problem: Nutrition: Goal: Adequate nutrition will be maintained Outcome: Progressing   Problem: Elimination: Goal: Will not experience complications related to bowel motility Outcome: Progressing   Problem: Elimination: Goal: Will not experience complications related to urinary retention Outcome: Progressing   

## 2020-03-29 NOTE — Progress Notes (Addendum)
PT Cancellation Note and Discharge  Patient Details Name: Sherry Hicks MRN: [id number] DOB: 27-Mar-1960   Cancelled Treatment:    Reason Eval/Treat Not Completed: New PT eval received, chart reviewed. Noted pt was evaluated by PT on 03/27/20 and discussed pt case with the evaluating therapist. It appears this patient is appropriate for custodial care at an assisted living/ skilled nursing facility/ memory care unit. Pt does not require acute PT interventions at this time as she appears to be at baseline of function. Please see full physical therapy evaluation note from 2/12 for more details. PT will sign off at this time.   Marylynn Pearson 03/29/2020, 1:38 PM   Conni Slipper, PT, DPT Acute Rehabilitation Services Pager: [phone] Office: [phone]

## 2020-03-30 DIAGNOSIS — R7401 Elevation of levels of liver transaminase levels: Secondary | ICD-10-CM | POA: Diagnosis not present

## 2020-03-30 DIAGNOSIS — F0391 Unspecified dementia with behavioral disturbance: Secondary | ICD-10-CM | POA: Diagnosis not present

## 2020-03-30 DIAGNOSIS — E440 Moderate protein-calorie malnutrition: Secondary | ICD-10-CM | POA: Diagnosis not present

## 2020-03-30 LAB — HEMOGLOBIN A1C
Hgb A1c MFr Bld: 4.8 % (ref 4.8–5.6)
Mean Plasma Glucose: 91 mg/dL

## 2020-03-30 NOTE — Progress Notes (Signed)
[facility] PROGRESS NOTE  Sherry Hicks JXB:[id number] DOB: [birth year] DOA: 01/27/2020 PCP: [name], DO    03/29/2020   Status: The patient remains OBS appropriate and will d/c before 2 midnights.  Patient does not meet criteria for inpatient but is difficult to place secondary to need for skilled nursing facility secondary to severe dementia.  Dispo: The patient is from: Home              Anticipated d/c is to: SNF              Anticipated d/c date is: > 3 days              Patient currently is not medically stable to d/c.  Barriers to discharge: Need to completely stabilize dementia behavioral issues with psychotropic medications before stable to discharge.  Still awaiting SNF bed offer   Difficult to place patient Yes  Level of care: Med-Surg  Code Status: Full Family Communication: Patient only DVT prophylaxis: Ambulatory and low risk for DVT Vaccination status: ? If has received Covid vaccines  Foley catheter: No  HPI: 60 y.o.Fwith Alzheimer's dementia with behavioral disturbance who presented with progressively erratic behavior over months.  See longer summary from H&P on 2/9, but in brief, patient had been living in [place] for years until family were informed by neighbors that patient was wandering, appeared unbathed, and she moved to [facility]. Here, she lived with daughter for about 2 years, but in the last few months, she started to behave more impulsively, to at times seem aggressive and have verbal outbursts, severe insomnia, and so APS recommended the patient be brought to the ER.  In the ER, the patient was seen by Psychiatry and started on new Seroquel. She was held pending geri-psych placement, which was delayed for several months.  Daytime behaviors improved on high dose Seroquel but continues with sundowning behaviors. Added Celexa as mood stabilizer along with Tegretol on 2/14   Subjective: Awake, remains pleasant without any specific  complaints verbalized  Objective: Vitals:   03/29/20 2114 03/30/20 0503  BP: 96/70 104/62  Pulse: 80 64  Resp: 14 14  Temp: 98 F (36.7 C) (!) 97.5 F (36.4 C)  SpO2: 100% 100%    Intake/Output Summary (Last 24 hours) at 03/30/2020 1308 Last data filed at 03/30/2020 0600 Gross per 24 hour  Intake 480 ml  Output 0 ml  Net 480 ml   Filed Weights   03/26/20 1500 03/26/20 2244  Weight: 58.8 kg 55.8 kg    Exam:  Constitutional: Alert, calm, no acute distress Respiratory: Anterior lung sounds clear and is stable on room air Cardiovascular: Pulse, pulse regular, extremities warm with adequate capillary refill Abdomen: Abdomen soft and nontender with normoactive bowel sounds. Patient eats better when fed noticing variable oral intake. LBM 2/14 Neurologic: CN 2-12 grossly intact. Sensation intact, Strength 5/5 x all 4 extremities.  Psychiatric: Awake and alert, oriented to name only.  Not to place, year or situation   Assessment/Plan: Acute problems: Dementia with behavioral disturbance Behaviors seem improved without behaviors that would require IM Geodon, Haldol or Seroquel prn Continue high-dose Seroquel-QTC checked on 2/14 less than 500. Continue Seroquel PO prn for breakthrough behavior Continue Celexa as mood stabilizer Continue 4 times daily Tegretol as adjunct to mood stabilizer and as an antianxiety agent-if becomes too sleepy/oversedated can decrease frequency Continue scheduled Vistaril for anxiety and continue gabapentin Last PT note 2/5, unable to participate in rehab due to advanced dementia, but  able to ambulate with 1 assist without difficulty. This was prior to improvement in behaviors after Seroquel initiated therefore on 2/14 have asked PT/OT to repeat evaluations Initial TSH slightly elevated with normal free T3 and T4-repeat TSH on 2/14 within normal limits with normal free T4 -Obtain CMET, lipid panel within normal limits except for slightly elevated LDL of  111, A1c(for metabolic syndrome monitoring on new Seroquel)  Other problems: Moderate protein calorie malnutrition -Continuefeeding supplements -Give ice cream or Magic cup twice daily  Transaminitis -Repeat LFTs ordered for 2/14 within normal limits   Data Reviewed: Basic Metabolic Panel: Recent Labs  Lab 03/29/20 0855  NA 140  K 4.4  CL 102  CO2 29  GLUCOSE 170*  BUN 20  CREATININE 0.88  CALCIUM 8.8*   Liver Function Tests: Recent Labs  Lab 03/29/20 0855  AST 20  ALT 31  ALKPHOS 64  BILITOT 0.7  PROT 5.4*  ALBUMIN 2.9*   No results for input(s): LIPASE, AMYLASE in the last 168 hours. No results for input(s): AMMONIA in the last 168 hours. CBC: Recent Labs  Lab 03/29/20 0855  WBC 3.6*  NEUTROABS 1.9  HGB 11.6*  HCT 36.7  MCV 99.7  PLT 277   Cardiac Enzymes: No results for input(s): CKTOTAL, CKMB, CKMBINDEX, TROPONINI in the last 168 hours. BNP (last 3 results) No results for input(s): BNP in the last 8760 hours.  ProBNP (last 3 results) No results for input(s): PROBNP in the last 8760 hours.  CBG: No results for input(s): GLUCAP in the last 168 hours.  Recent Results (from the past 240 hour(s))  SARS CORONAVIRUS 2 (TAT 6-24 HRS) Nasopharyngeal Nasopharyngeal Swab     Status: None   Collection Time: 03/24/20  2:50 PM   Specimen: Nasopharyngeal Swab  Result Value Ref Range Status   SARS Coronavirus 2 NEGATIVE NEGATIVE Final    Comment: (NOTE) SARS-CoV-2 target nucleic acids are NOT DETECTED.  The SARS-CoV-2 RNA is generally detectable in upper and lower respiratory specimens during the acute phase of infection. Negative results do not preclude SARS-CoV-2 infection, do not rule out co-infections with other pathogens, and should not be used as the sole basis for treatment or other patient management decisions. Negative results must be combined with clinical observations, patient history, and epidemiological information. The expected result  is Negative.  Fact Sheet for Patients: HairSlick.no  Fact Sheet for Healthcare Providers: quierodirigir.com  This test is not yet approved or cleared by the [place] FDA and  has been authorized for detection and/or diagnosis of SARS-CoV-2 by FDA under an Emergency Use Authorization (EUA). This EUA will remain  in effect (meaning this test can be used) for the duration of the COVID-19 declaration under Se ction 564(b)(1) of the Act, 21 U.S.C. section 360bbb-3(b)(1), unless the authorization is terminated or revoked sooner.  Performed at [facility], [address] [place], [place] [address]      Studies: No results found.  Scheduled Meds: . carbamazepine  200 mg Oral QID  . citalopram  10 mg Oral Daily  . feeding supplement  237 mL Oral BID BM  . gabapentin  100 mg Oral BID  . hydrOXYzine  25 mg Oral TID  . QUEtiapine  100 mg Oral BID  . QUEtiapine  150 mg Oral QHS   Continuous Infusions:  Principal Problem:   Dementia with behavioral disturbance (HCC) Active Problems:   Major depressive disorder, recurrent episode (HCC)   History of asthma, mild, intermittent  Early Onset Dementia with behavioral problem (HCC)   Protein-calorie malnutrition, moderate (HCC)   Transaminitis   Consultants:  Psychiatry  Procedures:  None  Antibiotics: Anti-infectives (From admission, onward)   None       Time spent: 30 minutes    Junious Silk ANP  [facility] 7 am - 330 pm/M-F for direct patient care and secure chat Please refer to Amion for contact info 0  days

## 2020-03-30 NOTE — Progress Notes (Signed)
Patient refused PM vital sign check, patient was educated and RN will continue to monitor this patient.

## 2020-03-30 NOTE — Progress Notes (Signed)
TOC CM/CSW received a call from [facility] [phone]. Lowella Bandy will be coming to visit pt for an assessment on 03/30/2020.  CSW has notified Okey Regal, [facility] CM/CSW will be available for [facility] visit.  CSW shared Carol's contact information with [place].  Mechel Schutter Tarpley-Carter, MSW, LCSW-A Pronouns:  She, Her, Hers                  [facility] Transitions of CareClinical Social Worker [email or web address] [phone]

## 2020-03-31 DIAGNOSIS — E440 Moderate protein-calorie malnutrition: Secondary | ICD-10-CM | POA: Diagnosis not present

## 2020-03-31 DIAGNOSIS — R7401 Elevation of levels of liver transaminase levels: Secondary | ICD-10-CM | POA: Diagnosis not present

## 2020-03-31 DIAGNOSIS — F0391 Unspecified dementia with behavioral disturbance: Secondary | ICD-10-CM | POA: Diagnosis not present

## 2020-03-31 DIAGNOSIS — F0281 Dementia in other diseases classified elsewhere with behavioral disturbance: Secondary | ICD-10-CM

## 2020-03-31 MED ORDER — CARBAMAZEPINE 200 MG PO TABS: 400.0000 mg | ORAL_TABLET | ORAL | Status: DC

## 2020-03-31 MED ORDER — CARBAMAZEPINE 200 MG PO TABS
400.0000 mg | ORAL_TABLET | Freq: Every evening | ORAL | Status: DC
  Administered 2020-03-31 – 2020-04-03 (×3): 400 mg via ORAL
  Filled 2020-03-31 (×5): qty 2

## 2020-03-31 MED ORDER — CARBAMAZEPINE 200 MG PO TABS
200.0000 mg | ORAL_TABLET | Freq: Two times a day (BID) | ORAL | Status: DC
  Administered 2020-03-31 – 2020-04-05 (×9): 200 mg via ORAL
  Filled 2020-03-31 (×9): qty 1

## 2020-03-31 MED ORDER — CARBAMAZEPINE 200 MG PO TABS: 200.0000 mg | ORAL_TABLET | ORAL | Status: DC

## 2020-03-31 NOTE — Progress Notes (Signed)
Patient refused PM vital sign check, RN educated patient and will continue to monitor this patient.

## 2020-03-31 NOTE — Progress Notes (Signed)
Patient refused to allow RN to scan arm band during shift

## 2020-03-31 NOTE — Progress Notes (Addendum)
[facility] PROGRESS NOTE  Sherry Hicks TKZ:[id number] DOB: 1960/09/09 DOA: 01/27/2020 PCP: [name], DO    03/29/2020   Status: The patient remains OBS appropriate and will d/c before 2 midnights.  Patient does not meet criteria for inpatient but is difficult to place secondary to need for skilled nursing facility secondary to severe dementia.  Dispo: The patient is from: Home              Anticipated d/c is to: SNF              Anticipated d/c date is: > 3 days              Patient currently is not medically stable to d/c.  Barriers to discharge: Need to completely stabilize dementia behavioral issues with psychotropic medications before stable to discharge.  Still awaiting SNF bed offer   Difficult to place patient Yes  Level of care: Med-Surg  Code Status: Full Family Communication: Patient only DVT prophylaxis: Ambulatory and low risk for DVT Vaccination status: ? If has received Covid vaccines  Foley catheter: No  HPI: 60 y.o.Fwith Alzheimer's dementia with behavioral disturbance who presented with progressively erratic behavior over months.  See longer summary from H&P on 2/9, but in brief, patient had been living in [place] for years until family were informed by neighbors that patient was wandering, appeared unbathed, and she moved to [facility]. Here, she lived with daughter for about 2 years, but in the last few months, she started to behave more impulsively, to at times seem aggressive and have verbal outbursts, severe insomnia, and so APS recommended the patient be brought to the ER.  In the ER, the patient was seen by Psychiatry and started on new Seroquel. She was held pending geri-psych placement, which was delayed for several months.  Daytime behaviors improved on high dose Seroquel but continues with sundowning behaviors. Added Celexa as mood stabilizer along with Tegretol on 2/14   Subjective: Somewhat more sleepy today and less interactive  although did briefly smile when I entered the room and introduced myself today.  No complaints verbalized.  Objective: Vitals:   03/30/20 0942 03/31/20 0437  BP: 107/66 102/65  Pulse: 71 70  Resp: 18 16  Temp: (!) 97.3 F (36.3 C) 98 F (36.7 C)  SpO2: 98% 97%    Intake/Output Summary (Last 24 hours) at 03/31/2020 0840 Last data filed at 03/31/2020 5732 Gross per 24 hour  Intake 1254 ml  Output 0 ml  Net 1254 ml   Filed Weights   03/26/20 1500 03/26/20 2244  Weight: 58.8 kg 55.8 kg    Exam:  Constitutional: Awake but less interactive than previous days, calm Respiratory: Lung sounds clear to auscultation, stable on room air Cardiovascular: Heart sounds S1-S2, no peripheral edema, no JVD, pulses regular nontachycardic Abdomen: Abdomen soft and nontender with normoactive bowel sounds.  Herbal oral intake and tends to eat better when encouraged and given finger foods.  LBM 2/14 Neurologic: CN 2-12 grossly intact.  Psychiatric: Awake and alert, oriented to name only.  Not to place, year or situation-continues with significant short-term memory deficits   Assessment/Plan: Acute problems: Dementia with behavioral disturbance Behaviors seem improved without behaviors that would require IM Geodon, Haldol or Seroquel prn Continue high-dose Seroquel-QTC checked on 2/14 less than 500. Continue Seroquel PO prn for breakthrough behavior Continue Celexa as mood stabilizer Will decrease Tegretol to 3 times per day dosing: 200 mg 8 am and 2 pm and 400 mg  6 pm Continue scheduled Vistaril for anxiety and continue gabapentin Last PT note 2/5, unable to participate in rehab due to advanced dementia, but able to ambulate with 1 assist without difficulty.. 2/14 was reevaluated by PT and given her status of custodial care not appropriate for acute PT.  Was also evaluated by OT and patient did not have comprehension to participate with OT sessions and was actually actively hallucinating talking  on the phone with her cousin therefore no acute OT services indicated as well. Initial TSH slightly elevated with normal free T3 and T4-repeat TSH on 2/14 within normal limits with normal free T4 -Obtain CMET, lipid panel within normal limits except for slightly elevated LDL of 111, A1c(for metabolic syndrome monitoring on new Seroquel)  Other problems: Moderate protein calorie malnutrition -Continuefeeding supplements -Give ice cream or Magic cup twice daily  Transaminitis -Repeat LFTs ordered for 2/14 within normal limits   Data Reviewed: Basic Metabolic Panel: Recent Labs  Lab 03/29/20 0855  NA 140  K 4.4  CL 102  CO2 29  GLUCOSE 170*  BUN 20  CREATININE 0.88  CALCIUM 8.8*   Liver Function Tests: Recent Labs  Lab 03/29/20 0855  AST 20  ALT 31  ALKPHOS 64  BILITOT 0.7  PROT 5.4*  ALBUMIN 2.9*   No results for input(s): LIPASE, AMYLASE in the last 168 hours. No results for input(s): AMMONIA in the last 168 hours. CBC: Recent Labs  Lab 03/29/20 0855  WBC 3.6*  NEUTROABS 1.9  HGB 11.6*  HCT 36.7  MCV 99.7  PLT 277   Cardiac Enzymes: No results for input(s): CKTOTAL, CKMB, CKMBINDEX, TROPONINI in the last 168 hours. BNP (last 3 results) No results for input(s): BNP in the last 8760 hours.  ProBNP (last 3 results) No results for input(s): PROBNP in the last 8760 hours.  CBG: No results for input(s): GLUCAP in the last 168 hours.  Recent Results (from the past 240 hour(s))  SARS CORONAVIRUS 2 (TAT 6-24 HRS) Nasopharyngeal Nasopharyngeal Swab     Status: None   Collection Time: 03/24/20  2:50 PM   Specimen: Nasopharyngeal Swab  Result Value Ref Range Status   SARS Coronavirus 2 NEGATIVE NEGATIVE Final    Comment: (NOTE) SARS-CoV-2 target nucleic acids are NOT DETECTED.  The SARS-CoV-2 RNA is generally detectable in upper and lower respiratory specimens during the acute phase of infection. Negative results do not preclude SARS-CoV-2 infection, do  not rule out co-infections with other pathogens, and should not be used as the sole basis for treatment or other patient management decisions. Negative results must be combined with clinical observations, patient history, and epidemiological information. The expected result is Negative.  Fact Sheet for Patients: HairSlick.no  Fact Sheet for Healthcare Providers: quierodirigir.com  This test is not yet approved or cleared by the [place] FDA and  has been authorized for detection and/or diagnosis of SARS-CoV-2 by FDA under an Emergency Use Authorization (EUA). This EUA will remain  in effect (meaning this test can be used) for the duration of the COVID-19 declaration under Se ction 564(b)(1) of the Act, 21 U.S.C. section 360bbb-3(b)(1), unless the authorization is terminated or revoked sooner.  Performed at [facility], [address], [place], [place] [address]      Studies: No results found.  Scheduled Meds: . carbamazepine  200 mg Oral QID  . citalopram  10 mg Oral Daily  . feeding supplement  237 mL Oral BID BM  . gabapentin  100  mg Oral BID  . hydrOXYzine  25 mg Oral TID  . QUEtiapine  100 mg Oral BID  . QUEtiapine  150 mg Oral QHS   Continuous Infusions:  Principal Problem:   Dementia with behavioral disturbance (HCC) Active Problems:   Major depressive disorder, recurrent episode (HCC)   History of asthma, mild, intermittent   Early Onset Dementia with behavioral problem (HCC)   Protein-calorie malnutrition, moderate (HCC)   Transaminitis   Consultants:  Psychiatry  Procedures:  None  Antibiotics: Anti-infectives (From admission, onward)   None       Time spent: 20 minutes    Junious Silk ANP  [facility] 7 am - 330 pm/M-F for direct patient care and secure chat Please refer to Amion for contact info 0  days

## 2020-03-31 NOTE — Progress Notes (Signed)
Patient refused AM vital sign check, RN educated the patient and will continue to monitor this patient.

## 2020-03-31 NOTE — Progress Notes (Addendum)
10:30am: CSW met with Pennie Banter after her meeting with the patient - Lexine Baton states [facility] in [place] can likely offer this patient a bed as she does not seem to meet requirements for a locked unit.   CSW sent updated clinicals through [facility] to [facility] for review.  8am: CSW spoke with Pennie Banter regarding her visit to [facility] - her estimated time of arrival is today at 10:15am.  RN aware of plan.  Madilyn Fireman, MSW, LCSW Transitions of Care  Clinical Social Worker II [phone]

## 2020-04-01 DIAGNOSIS — R7401 Elevation of levels of liver transaminase levels: Secondary | ICD-10-CM | POA: Diagnosis not present

## 2020-04-01 DIAGNOSIS — E440 Moderate protein-calorie malnutrition: Secondary | ICD-10-CM

## 2020-04-01 DIAGNOSIS — F0391 Unspecified dementia with behavioral disturbance: Secondary | ICD-10-CM | POA: Diagnosis not present

## 2020-04-01 NOTE — Progress Notes (Signed)
[facility] PROGRESS NOTE  Sherry Hicks MPN:[id number] DOB: 01-12-1961 DOA: 01/27/2020 PCP: [name], DO    03/29/2020   Status: The patient remains OBS appropriate and will d/c before 2 midnights.  Patient does not meet criteria for inpatient but is difficult to place secondary to need for skilled nursing facility secondary to severe dementia.  Dispo: The patient is from: Home              Anticipated d/c is to: SNF              Anticipated d/c date is: > 3 days              Patient currently is not medically stable to d/c.  Barriers to discharge: Need to completely stabilize dementia behavioral issues with psychotropic medications before stable to discharge.  Still awaiting SNF bed offer   Difficult to place patient Yes  Level of care: Med-Surg  Code Status: Full Family Communication: Patient only DVT prophylaxis: Ambulatory and low risk for DVT Vaccination status: ? If has received Covid vaccines  Foley catheter: No  HPI: 60 y.o.Fwith Alzheimer's dementia with behavioral disturbance who presented with progressively erratic behavior over months.  See longer summary from H&P on 2/9, but in brief, patient had been living in [place] for years until family were informed by neighbors that patient was wandering, appeared unbathed, and she moved to [facility]. Here, she lived with daughter for about 2 years, but in the last few months, she started to behave more impulsively, to at times seem aggressive and have verbal outbursts, severe insomnia, and so APS recommended the patient be brought to the ER.  In the ER, the patient was seen by Psychiatry and started on new Seroquel. She was held pending geri-psych placement, which was delayed for several months.  Daytime behaviors improved on high dose Seroquel but continues with sundowning behaviors. Added Celexa as mood stabilizer along with Tegretol on 2/14   Subjective: Awake-eating bacon and jelly sandwich.  No  specific complaints verbalized.  Remains calm.  Not as sleepy as yesterday 2/16  Objective: Vitals:   03/31/20 2141 04/01/20 0523  BP: 122/83 (!) 142/85  Pulse: 77 74  Resp: 16 16  Temp: 97.6 F (36.4 C) 98.5 F (36.9 C)  SpO2: 99% 100%    Intake/Output Summary (Last 24 hours) at 04/01/2020 0801 Last data filed at 04/01/2020 0601 Gross per 24 hour  Intake 474 ml  Output 0 ml  Net 474 ml   Filed Weights   03/26/20 1500 03/26/20 2244 03/31/20 2141  Weight: 58.8 kg 55.8 kg 55.8 kg    Exam:  Constitutional:, Calm, no acute distress Respiratory: Bilateral lung sounds clear to auscultation posteriorly, stable on room air Cardiovascular: Heart sounds S1-S2, no peripheral edema, extremities warm to touch with adequate capillary refill Abdomen: Soft nontender nondistended.  Remains with variable oral intake although does better when food tray set up and provider at bedside assisting with meals.   LBM 2/14 Neurologic: CN 2-12 grossly intact.  All extremities x4 without any focal neurological deficits.  Gait and ambulation not assessed. Psychiatric: Awake and alert.  Oriented times name only.  Currently not delusional or having active hallucinations.   Assessment/Plan: Acute problems: Dementia with behavioral disturbance Behaviors seem improved without behaviors that would require IM Geodon, Haldol or Seroquel prn Continue high-dose Seroquel-QTC checked on 2/14 less than 500. Continue Seroquel PO prn for breakthrough behavior Continue Celexa as mood stabilizer 2/17 noted resolution of daytime  sleepiness with change in Tegretol dosing to 3 times per day: 200 mg 8 am and 2 pm and 400 mg 6 pm Continue scheduled Vistaril for anxiety and continue gabapentin Last PT note 2/5, unable to participate in rehab due to advanced dementia, but able to ambulate with 1 assist without difficulty.. 2/14 was reevaluated by PT and given her status of custodial care not appropriate for acute PT.  Was also  evaluated by OT and patient did not have comprehension to participate with OT sessions and was actually actively hallucinating talking on the phone with her cousin therefore no acute OT services indicated as well. Initial TSH slightly elevated with normal free T3 and T4-repeat TSH on 2/14 within normal limits with normal free T4 -Obtain CMET, lipid panel within normal limits except for slightly elevated LDL of 111, A1c(for metabolic syndrome monitoring on new Seroquel)  Other problems: Moderate to severe protein calorie malnutrition -Continuefeeding supplements -Give ice cream or Magic cup twice daily -Albumin 2.9 on 2/14  Transaminitis -Repeat LFTs ordered for 2/14 within normal limits   Data Reviewed: Basic Metabolic Panel: Recent Labs  Lab 03/29/20 0855  NA 140  K 4.4  CL 102  CO2 29  GLUCOSE 170*  BUN 20  CREATININE 0.88  CALCIUM 8.8*   Liver Function Tests: Recent Labs  Lab 03/29/20 0855  AST 20  ALT 31  ALKPHOS 64  BILITOT 0.7  PROT 5.4*  ALBUMIN 2.9*   No results for input(s): LIPASE, AMYLASE in the last 168 hours. No results for input(s): AMMONIA in the last 168 hours. CBC: Recent Labs  Lab 03/29/20 0855  WBC 3.6*  NEUTROABS 1.9  HGB 11.6*  HCT 36.7  MCV 99.7  PLT 277   Cardiac Enzymes: No results for input(s): CKTOTAL, CKMB, CKMBINDEX, TROPONINI in the last 168 hours. BNP (last 3 results) No results for input(s): BNP in the last 8760 hours.  ProBNP (last 3 results) No results for input(s): PROBNP in the last 8760 hours.  CBG: No results for input(s): GLUCAP in the last 168 hours.  Recent Results (from the past 240 hour(s))  SARS CORONAVIRUS 2 (TAT 6-24 HRS) Nasopharyngeal Nasopharyngeal Swab     Status: None   Collection Time: 03/24/20  2:50 PM   Specimen: Nasopharyngeal Swab  Result Value Ref Range Status   SARS Coronavirus 2 NEGATIVE NEGATIVE Final    Comment: (NOTE) SARS-CoV-2 target nucleic acids are NOT DETECTED.  The  SARS-CoV-2 RNA is generally detectable in upper and lower respiratory specimens during the acute phase of infection. Negative results do not preclude SARS-CoV-2 infection, do not rule out co-infections with other pathogens, and should not be used as the sole basis for treatment or other patient management decisions. Negative results must be combined with clinical observations, patient history, and epidemiological information. The expected result is Negative.  Fact Sheet for Patients: HairSlick.no  Fact Sheet for Healthcare Providers: quierodirigir.com  This test is not yet approved or cleared by the [place] FDA and  has been authorized for detection and/or diagnosis of SARS-CoV-2 by FDA under an Emergency Use Authorization (EUA). This EUA will remain  in effect (meaning this test can be used) for the duration of the COVID-19 declaration under Se ction 564(b)(1) of the Act, 21 U.S.C. section 360bbb-3(b)(1), unless the authorization is terminated or revoked sooner.  Performed at [facility], [address], [place], [place] [address]      Studies: No results found.  Scheduled Meds: . carbamazepine  200 mg Oral BID   And  . carbamazepine  400 mg Oral QPM  . citalopram  10 mg Oral Daily  . feeding supplement  237 mL Oral BID BM  . gabapentin  100 mg Oral BID  . hydrOXYzine  25 mg Oral TID  . QUEtiapine  100 mg Oral BID  . QUEtiapine  150 mg Oral QHS   Continuous Infusions:  Principal Problem:   Dementia with behavioral disturbance (HCC) Active Problems:   Major depressive disorder, recurrent episode (HCC)   History of asthma, mild, intermittent   Early Onset Dementia with behavioral problem (HCC)   Protein-calorie malnutrition, moderate (HCC)   Transaminitis   Consultants:  Psychiatry  Procedures:  None  Antibiotics: Anti-infectives (From admission, onward)   None       Time spent: 20  minutes    Junious Silk ANP  [facility] 7 am - 330 pm/M-F for direct patient care and secure chat Please refer to Amion for contact info 0  days

## 2020-04-01 NOTE — Plan of Care (Signed)
  Problem: Nutrition: Goal: Adequate nutrition will be maintained Outcome: Progressing   Problem: Education: Goal: Knowledge of General Education information will improve Description: Including pain rating scale, medication(s)/side effects and non-pharmacologic comfort measures Outcome: Not Progressing   

## 2020-04-02 DIAGNOSIS — F0151 Vascular dementia with behavioral disturbance: Secondary | ICD-10-CM

## 2020-04-02 DIAGNOSIS — L8990 Pressure ulcer of unspecified site, unspecified stage: Secondary | ICD-10-CM | POA: Insufficient documentation

## 2020-04-02 DIAGNOSIS — L89212 Pressure ulcer of right hip, stage 2: Secondary | ICD-10-CM

## 2020-04-02 DIAGNOSIS — R7401 Elevation of levels of liver transaminase levels: Secondary | ICD-10-CM | POA: Diagnosis not present

## 2020-04-02 DIAGNOSIS — F0391 Unspecified dementia with behavioral disturbance: Secondary | ICD-10-CM | POA: Diagnosis not present

## 2020-04-02 DIAGNOSIS — E440 Moderate protein-calorie malnutrition: Secondary | ICD-10-CM | POA: Diagnosis not present

## 2020-04-02 MED ORDER — LORAZEPAM 2 MG/ML IJ SOLN
1.0000 mg | Freq: Once | INTRAMUSCULAR | Status: DC
  Filled 2020-04-02: qty 1

## 2020-04-02 MED ORDER — HALOPERIDOL LACTATE 5 MG/ML IJ SOLN
5.0000 mg | Freq: Once | INTRAMUSCULAR | Status: AC
  Administered 2020-04-02: 5 mg via INTRAMUSCULAR
  Filled 2020-04-02: qty 1

## 2020-04-02 NOTE — Progress Notes (Signed)
Nursing staff was unable to complete EKG on this patient as she yelled "don't touch me" when nurse tech attempted to place EKG leads and she started to swing her arms at staff. MD has been notified and RN will continue to monitor this patient.

## 2020-04-02 NOTE — Progress Notes (Addendum)
2:30pm: CSW received return call from Gavin Pound who states the facility cannot offer a bed at this time.  CSW notified [facility] Director of information.  10:20am: CSW attempted to reach Gavin Pound at [facility] to request an update - a voicemail was left requesting a return call.  Edwin Dada, MSW, LCSW Transitions of Care  Clinical Social Worker II [phone]

## 2020-04-02 NOTE — Progress Notes (Signed)
[facility] PROGRESS NOTE  Sherry Hicks QIW:[id number] DOB: 1960/08/08 DOA: 01/27/2020 PCP: [name], DO    03/29/2020   Status: The patient remains OBS appropriate and will d/c before 2 midnights.  Patient does not meet criteria for inpatient but is difficult to place secondary to need for skilled nursing facility secondary to severe dementia.  Dispo: The patient is from: Home              Anticipated d/c is to: SNF              Anticipated d/c date is: > 3 days              Patient currently is not medically stable to d/c.  Barriers to discharge: Need to completely stabilize dementia behavioral issues with psychotropic medications before stable to discharge.  Still awaiting SNF bed offer   Difficult to place patient Yes  Level of care: Med-Surg  Code Status: Full Family Communication: Patient only DVT prophylaxis: Ambulatory and low risk for DVT Vaccination status: ? If has received Covid vaccines  Foley catheter: No  HPI: 60 y.o.Fwith Alzheimer's dementia with behavioral disturbance who presented with progressively erratic behavior over months.  See longer summary from H&P on 2/9, but in brief, patient had been living in [place] for years until family were informed by neighbors that patient was wandering, appeared unbathed, and she moved to [facility]. Here, she lived with daughter for about 2 years, but in the last few months, she started to behave more impulsively, to at times seem aggressive and have verbal outbursts, severe insomnia, and so APS recommended the patient be brought to the ER.  In the ER, the patient was seen by Psychiatry and started on new Seroquel. She was held pending geri-psych placement, which was delayed for several months.  Daytime behaviors improved on high dose Seroquel but continues with sundowning behaviors. Added Celexa as mood stabilizer along with Tegretol on 2/14   Subjective: Awake.  Laying on right side.  Did not object  to me performing pulmonary exam.  Earlier in the day would not allow nursing staff to apply leads to obtain twelve-lead EKG.  Apparently last night she was refusing some of her medications but apparently later allowed staff to administer.  No documented aggressive or violent behaviors.  Objective: Vitals:   04/01/20 2122 04/02/20 0606  BP: 121/82 105/73  Pulse: 79 60  Resp: 18 18  Temp: 98.4 F (36.9 C) 98.3 F (36.8 C)  SpO2:      Intake/Output Summary (Last 24 hours) at 04/02/2020 0814 Last data filed at 04/02/2020 0601 Gross per 24 hour  Intake 600 ml  Output 0 ml  Net 600 ml   Filed Weights   03/26/20 1500 03/26/20 2244 03/31/20 2141  Weight: 58.8 kg 55.8 kg 55.8 kg    Exam:  Constitutional: Awake, laying on right side, no acute distress Respiratory: Clear to auscultation posteriorly, stable on room air Cardiovascular: Normal heart sounds, extremities warm, no peripheral edema, pulse regular Abdomen: Soft nontender nondistended with normoactive bowel sounds.  LBM 2/14 Neurologic: CN 2-12 grossly intact.  All extremities x4 without any focal neurological deficits.  Gait and ambulation not assessed. Psychiatric: Awake and alert.  Oriented times name only.  Currently not delusional or having active hallucinations.  **12:28 PM received page from nursing staff.  Attempting to change patient's bed linen when she became very agitated and was hallucinating.  Trying to get out of bed without assistance.  Gentle  redirection was attempted but patient was pulling at staff's clothing and pushing staff hands away.  Patient was given oral Seroquel as needed per recent order without any improvement in behaviors.  Orders given to try Haldol 5 mg IM x1.  After 1 hour if agitation and behaviors persist orders to give I am Ativan 1 mg x 1.   Assessment/Plan: Acute problems: Dementia with behavioral disturbance Behaviors seem improved without behaviors that would require IM Geodon, Haldol or  Seroquel prn Continue high-dose Seroquel-QTC checked on 2/14 less than 500. Continue Seroquel PO prn for breakthrough behavior Continue Celexa as mood stabilizer 2/17 noted resolution of daytime sleepiness with change in Tegretol dosing to 3 times per day: 200 mg 8 am and 2 pm and 400 mg 6 pm Continue scheduled Vistaril for anxiety and continue gabapentin Last PT note 2/5, unable to participate in rehab due to advanced dementia, but able to ambulate with 1 assist without difficulty.. 2/14 was reevaluated by PT and given her status of custodial care not appropriate for acute PT.  Was also evaluated by OT and patient did not have comprehension to participate with OT sessions and was actually actively hallucinating talking on the phone with her cousin therefore no acute OT services indicated as well. Initial TSH slightly elevated with normal free T3 and T4-repeat TSH on 2/14 within normal limits with normal free T4 -Obtain CMET, lipid panel within normal limits except for slightly elevated LDL of 111, A1c(for metabolic syndrome monitoring on new Seroquel)  Pressure injury Right anterior hip/Stage 2 -Add air mattress -Maximize nutrition (see below) and encourage mobility (OOB to chair/ambulate TID) Pressure Injury 04/01/20 Hip Anterior;Right Stage 2 -  Partial thickness loss of dermis presenting as a shallow open injury with a red, pink wound bed without slough. (Active)  Date First Assessed/Time First Assessed: 04/01/20 2230   Location: Hip  Location Orientation: Anterior;Right  Staging: Stage 2 -  Partial thickness loss of dermis presenting as a shallow open injury with a red, pink wound bed without slough.  Present ...    Assessments 04/01/2020 10:05 PM  Dressing Type Foam - Lift dressing to assess site every shift  Dressing Clean;Dry;Intact  Site / Wound Assessment Pink  Drainage Amount None     No Linked orders to display    Other problems: Moderate to severe protein calorie  malnutrition -Continuefeeding supplements -Give ice cream or Magic cup twice daily -Albumin 2.9 on 2/14  Transaminitis -Repeat LFTs ordered for 2/14 within normal limits   Data Reviewed: Basic Metabolic Panel: Recent Labs  Lab 03/29/20 0855  NA 140  K 4.4  CL 102  CO2 29  GLUCOSE 170*  BUN 20  CREATININE 0.88  CALCIUM 8.8*   Liver Function Tests: Recent Labs  Lab 03/29/20 0855  AST 20  ALT 31  ALKPHOS 64  BILITOT 0.7  PROT 5.4*  ALBUMIN 2.9*   No results for input(s): LIPASE, AMYLASE in the last 168 hours. No results for input(s): AMMONIA in the last 168 hours. CBC: Recent Labs  Lab 03/29/20 0855  WBC 3.6*  NEUTROABS 1.9  HGB 11.6*  HCT 36.7  MCV 99.7  PLT 277   Cardiac Enzymes: No results for input(s): CKTOTAL, CKMB, CKMBINDEX, TROPONINI in the last 168 hours. BNP (last 3 results) No results for input(s): BNP in the last 8760 hours.  ProBNP (last 3 results) No results for input(s): PROBNP in the last 8760 hours.  CBG: No results for input(s): GLUCAP in the last 168  hours.  Recent Results (from the past 240 hour(s))  SARS CORONAVIRUS 2 (TAT 6-24 HRS) Nasopharyngeal Nasopharyngeal Swab     Status: None   Collection Time: 03/24/20  2:50 PM   Specimen: Nasopharyngeal Swab  Result Value Ref Range Status   SARS Coronavirus 2 NEGATIVE NEGATIVE Final    Comment: (NOTE) SARS-CoV-2 target nucleic acids are NOT DETECTED.  The SARS-CoV-2 RNA is generally detectable in upper and lower respiratory specimens during the acute phase of infection. Negative results do not preclude SARS-CoV-2 infection, do not rule out co-infections with other pathogens, and should not be used as the sole basis for treatment or other patient management decisions. Negative results must be combined with clinical observations, patient history, and epidemiological information. The expected result is Negative.  Fact Sheet for  Patients: HairSlick.no  Fact Sheet for Healthcare Providers: quierodirigir.com  This test is not yet approved or cleared by the [place] FDA and  has been authorized for detection and/or diagnosis of SARS-CoV-2 by FDA under an Emergency Use Authorization (EUA). This EUA will remain  in effect (meaning this test can be used) for the duration of the COVID-19 declaration under Se ction 564(b)(1) of the Act, 21 U.S.C. section 360bbb-3(b)(1), unless the authorization is terminated or revoked sooner.  Performed at [facility], [address], [place], [place] [address]      Studies: No results found.  Scheduled Meds: . carbamazepine  200 mg Oral BID   And  . carbamazepine  400 mg Oral QPM  . citalopram  10 mg Oral Daily  . feeding supplement  237 mL Oral BID BM  . gabapentin  100 mg Oral BID  . hydrOXYzine  25 mg Oral TID  . QUEtiapine  100 mg Oral BID  . QUEtiapine  150 mg Oral QHS   Continuous Infusions:  Principal Problem:   Dementia with behavioral disturbance (HCC) Active Problems:   Major depressive disorder, recurrent episode (HCC)   History of asthma, mild, intermittent   Early Onset Dementia with behavioral problem (HCC)   Protein-calorie malnutrition, moderate (HCC)   Transaminitis   Pressure injury of skin   Consultants:  Psychiatry  Procedures:  None  Antibiotics: Anti-infectives (From admission, onward)   None       Time spent: 20 minutes    Junious Silk ANP  [facility] 7 am - 330 pm/M-F for direct patient care and secure chat Please refer to Amion for contact info 0  days

## 2020-04-02 NOTE — Progress Notes (Signed)
Patient experienced increasing episodes of outbursts today that including yelling, screaming, hallucinations, and continuing to attempt to get out of bed. RN has moved the patient to Rm. 13 on the pre exisisting unit closer to the nursing station for more close monitor of the patient. MD has been notified and the daughter Arneda Sappington has been notified of this change. MD placed new orders for haldol which was administered earlier today and the patient has appeared to calm down.

## 2020-04-03 DIAGNOSIS — F0151 Vascular dementia with behavioral disturbance: Secondary | ICD-10-CM | POA: Diagnosis not present

## 2020-04-03 MED ORDER — LORAZEPAM 2 MG/ML IJ SOLN: 1.0000 mg | Freq: Once | INTRAMUSCULAR | Status: DC

## 2020-04-03 MED ORDER — LORAZEPAM 2 MG/ML IJ SOLN
1.0000 mg | Freq: Once | INTRAMUSCULAR | Status: AC
  Administered 2020-04-03: 1 mg via INTRAMUSCULAR

## 2020-04-03 NOTE — Progress Notes (Signed)
[facility] PROGRESS NOTE  Sherry Hicks DPO:[id number] DOB: [birth year] DOA: 01/27/2020 PCP: [name], DO    03/29/2020   Status: The patient remains OBS appropriate and will d/c before 2 midnights.  Patient does not meet criteria for inpatient but is difficult to place secondary to need for skilled nursing facility secondary to severe dementia.  Dispo: The patient is from: Home              Anticipated d/c is to: SNF              Anticipated d/c date is: > 3 days              Patient currently is not medically stable to d/c.  Barriers to discharge: Need to completely stabilize dementia behavioral issues with psychotropic medications before stable to discharge.  Still awaiting SNF bed offer   Difficult to place patient Yes  Level of care: Med-Surg  Code Status: Full Family Communication: Patient only DVT prophylaxis: Ambulatory and low risk for DVT Vaccination status: ? If has received Covid vaccines  Foley catheter: No  HPI: 60 y.o.Fwith Alzheimer's dementia with behavioral disturbance who presented with progressively erratic behavior over months.  See longer summary from H&P on 2/9, but in brief, patient had been living in [place] for years until family were informed by neighbors that patient was wandering, appeared unbathed, and she moved to [facility]. Here, she lived with daughter for about 2 years, but in the last few months, she started to behave more impulsively, to at times seem aggressive and have verbal outbursts, severe insomnia, and so APS recommended the patient be brought to the ER.  In the ER, the patient was seen by Psychiatry and started on new Seroquel. She was held pending geri-psych placement, which was delayed for several months.  Daytime behaviors improved on high dose Seroquel but continues with sundowning behaviors. Added Celexa as mood stabilizer along with Tegretol on 2/14   Subjective:  Per night RN "Patient  yelling,screaming,hallucinating,unable to be redirected,after staff cleaned her as she was incontinent of urine" she received ativan around 6:50 am Currently she is calm , Awake. Open eyes, does not engage in conversation,  Laying on right side.  ( she appears favor laying on right side)  Objective: Vitals:   04/02/20 2018 04/03/20 0528  BP: 108/75 120/72  Pulse: 68 82  Resp:  20  Temp: 97.8 F (36.6 C) 98.4 F (36.9 C)  SpO2: 100% 100%    Intake/Output Summary (Last 24 hours) at 04/03/2020 1342 Last data filed at 04/03/2020 0540 Gross per 24 hour  Intake 0 ml  Output 0 ml  Net 0 ml   Filed Weights   03/26/20 1500 03/26/20 2244 03/31/20 2141  Weight: 58.8 kg 55.8 kg 55.8 kg    Exam:  Constitutional: Awake, laying on right side, no acute distress Respiratory: Clear to auscultation posteriorly, stable on room air Cardiovascular: Normal heart sounds, extremities warm, no peripheral edema, pulse regular Abdomen: Soft nontender nondistended with normoactive bowel sounds.  LBM 2/14 Neurologic: CN 2-12 grossly intact.  All extremities x4 without any focal neurological deficits.  Gait and ambulation not assessed. Psychiatric: Awake and alert.  Oriented times name only.  Currently not delusional or having active hallucinations.  **12:28 PM received page from nursing staff.  Attempting to change patient's bed linen when she became very agitated and was hallucinating.  Trying to get out of bed without assistance.  Gentle redirection was attempted but patient was  pulling at staff's clothing and pushing staff hands away.  Patient was given oral Seroquel as needed per recent order without any improvement in behaviors.  Orders given to try Haldol 5 mg IM x1.  After 1 hour if agitation and behaviors persist orders to give I am Ativan 1 mg x 1.   Assessment/Plan: Acute problems: Dementia with behavioral disturbance Behaviors seem improved without behaviors that would require IM Geodon, Haldol  or Seroquel prn Continue high-dose Seroquel-QTC checked on 2/14 less than 500. Continue Seroquel PO prn for breakthrough behavior Continue Celexa as mood stabilizer 2/17 noted resolution of daytime sleepiness with change in Tegretol dosing to 3 times per day: 200 mg 8 am and 2 pm and 400 mg 6 pm Continue scheduled Vistaril for anxiety and continue gabapentin Last PT note 2/5, unable to participate in rehab due to advanced dementia, but able to ambulate with 1 assist without difficulty.. 2/14 was reevaluated by PT and given her status of custodial care not appropriate for acute PT.  Was also evaluated by OT and patient did not have comprehension to participate with OT sessions and was actually actively hallucinating talking on the phone with her cousin therefore no acute OT services indicated as well. Initial TSH slightly elevated with normal free T3 and T4-repeat TSH on 2/14 within normal limits with normal free T4 -Obtain CMET, lipid panel within normal limits except for slightly elevated LDL of 111, A1c(for metabolic syndrome monitoring on new Seroquel)  Pressure injury Right anterior hip/Stage 2 -Add air mattress -Maximize nutrition (see below) and encourage mobility (OOB to chair/ambulate TID) Pressure Injury 04/01/20 Hip Anterior;Right Stage 2 -  Partial thickness loss of dermis presenting as a shallow open injury with a red, pink wound bed without slough. (Active)  Date First Assessed/Time First Assessed: 04/01/20 2230   Location: Hip  Location Orientation: Anterior;Right  Staging: Stage 2 -  Partial thickness loss of dermis presenting as a shallow open injury with a red, pink wound bed without slough.  Present ...    Assessments 04/01/2020 10:05 PM 04/03/2020  8:15 AM  Dressing Type Foam - Lift dressing to assess site every shift Foam - Lift dressing to assess site every shift  Dressing Clean;Dry;Intact Clean;Dry;Intact  Site / Wound Assessment Pink --  Drainage Amount None --     No  Linked orders to display    Other problems: Moderate to severe protein calorie malnutrition -Continuefeeding supplements -Give ice cream or Magic cup twice daily -Albumin 2.9 on 2/14  Transaminitis -Repeat LFTs ordered for 2/14 within normal limits   Data Reviewed: Basic Metabolic Panel: Recent Labs  Lab 03/29/20 0855  NA 140  K 4.4  CL 102  CO2 29  GLUCOSE 170*  BUN 20  CREATININE 0.88  CALCIUM 8.8*   Liver Function Tests: Recent Labs  Lab 03/29/20 0855  AST 20  ALT 31  ALKPHOS 64  BILITOT 0.7  PROT 5.4*  ALBUMIN 2.9*   No results for input(s): LIPASE, AMYLASE in the last 168 hours. No results for input(s): AMMONIA in the last 168 hours. CBC: Recent Labs  Lab 03/29/20 0855  WBC 3.6*  NEUTROABS 1.9  HGB 11.6*  HCT 36.7  MCV 99.7  PLT 277   Cardiac Enzymes: No results for input(s): CKTOTAL, CKMB, CKMBINDEX, TROPONINI in the last 168 hours. BNP (last 3 results) No results for input(s): BNP in the last 8760 hours.  ProBNP (last 3 results) No results for input(s): PROBNP in the last 8760 hours.  CBG: No results for input(s): GLUCAP in the last 168 hours.  Recent Results (from the past 240 hour(s))  SARS CORONAVIRUS 2 (TAT 6-24 HRS) Nasopharyngeal Nasopharyngeal Swab     Status: None   Collection Time: 03/24/20  2:50 PM   Specimen: Nasopharyngeal Swab  Result Value Ref Range Status   SARS Coronavirus 2 NEGATIVE NEGATIVE Final    Comment: (NOTE) SARS-CoV-2 target nucleic acids are NOT DETECTED.  The SARS-CoV-2 RNA is generally detectable in upper and lower respiratory specimens during the acute phase of infection. Negative results do not preclude SARS-CoV-2 infection, do not rule out co-infections with other pathogens, and should not be used as the sole basis for treatment or other patient management decisions. Negative results must be combined with clinical observations, patient history, and epidemiological information. The expected result is  Negative.  Fact Sheet for Patients: HairSlick.no  Fact Sheet for Healthcare Providers: quierodirigir.com  This test is not yet approved or cleared by the [place] FDA and  has been authorized for detection and/or diagnosis of SARS-CoV-2 by FDA under an Emergency Use Authorization (EUA). This EUA will remain  in effect (meaning this test can be used) for the duration of the COVID-19 declaration under Se ction 564(b)(1) of the Act, 21 U.S.C. section 360bbb-3(b)(1), unless the authorization is terminated or revoked sooner.  Performed at [facility], [address], [place], [place] [address]      Studies: No results found.  Scheduled Meds: . carbamazepine  200 mg Oral BID   And  . carbamazepine  400 mg Oral QPM  . citalopram  10 mg Oral Daily  . feeding supplement  237 mL Oral BID BM  . gabapentin  100 mg Oral BID  . hydrOXYzine  25 mg Oral TID  . LORazepam  1 mg Intramuscular Once  . QUEtiapine  100 mg Oral BID  . QUEtiapine  150 mg Oral QHS   Continuous Infusions:  Principal Problem:   Dementia with behavioral disturbance (HCC) Active Problems:   Major depressive disorder, recurrent episode (HCC)   History of asthma, mild, intermittent   Early Onset Dementia with behavioral problem (HCC)   Protein-calorie malnutrition, moderate (HCC)   Transaminitis   Pressure injury of skin   Consultants:  Psychiatry  Procedures:  None  Antibiotics: Anti-infectives (From admission, onward)   None       Time spent: 15 minutes    Albertine Grates MD PhD FACP [facility] Please refer to [facility] for contact info 0  days

## 2020-04-03 NOTE — Progress Notes (Signed)
Patient yelling,screaming,hallucinating,unable to be redirected,after staff cleaned her as she was incontinent of urine. MD on call notified  via secure chat. Order received to give ativan which was given. Will continue to monitor.

## 2020-04-04 DIAGNOSIS — F330 Major depressive disorder, recurrent, mild: Secondary | ICD-10-CM | POA: Diagnosis not present

## 2020-04-04 DIAGNOSIS — F419 Anxiety disorder, unspecified: Secondary | ICD-10-CM | POA: Diagnosis not present

## 2020-04-04 DIAGNOSIS — G300 Alzheimer's disease with early onset: Secondary | ICD-10-CM | POA: Diagnosis not present

## 2020-04-04 DIAGNOSIS — R441 Visual hallucinations: Secondary | ICD-10-CM | POA: Diagnosis not present

## 2020-04-04 DIAGNOSIS — F0151 Vascular dementia with behavioral disturbance: Secondary | ICD-10-CM | POA: Diagnosis not present

## 2020-04-04 DIAGNOSIS — Z885 Allergy status to narcotic agent status: Secondary | ICD-10-CM | POA: Diagnosis not present

## 2020-04-04 DIAGNOSIS — G4700 Insomnia, unspecified: Secondary | ICD-10-CM | POA: Diagnosis not present

## 2020-04-04 DIAGNOSIS — F05 Delirium due to known physiological condition: Secondary | ICD-10-CM | POA: Diagnosis not present

## 2020-04-04 DIAGNOSIS — Z781 Physical restraint status: Secondary | ICD-10-CM | POA: Diagnosis not present

## 2020-04-04 DIAGNOSIS — Z7401 Bed confinement status: Secondary | ICD-10-CM | POA: Diagnosis not present

## 2020-04-04 DIAGNOSIS — F0391 Unspecified dementia with behavioral disturbance: Secondary | ICD-10-CM | POA: Diagnosis present

## 2020-04-04 DIAGNOSIS — F0281 Dementia in other diseases classified elsewhere with behavioral disturbance: Secondary | ICD-10-CM | POA: Diagnosis not present

## 2020-04-04 DIAGNOSIS — Z888 Allergy status to other drugs, medicaments and biological substances status: Secondary | ICD-10-CM | POA: Diagnosis not present

## 2020-04-04 DIAGNOSIS — L89212 Pressure ulcer of right hip, stage 2: Secondary | ICD-10-CM | POA: Diagnosis not present

## 2020-04-04 DIAGNOSIS — Z87891 Personal history of nicotine dependence: Secondary | ICD-10-CM | POA: Diagnosis not present

## 2020-04-04 DIAGNOSIS — Z743 Need for continuous supervision: Secondary | ICD-10-CM | POA: Diagnosis not present

## 2020-04-04 DIAGNOSIS — Z20822 Contact with and (suspected) exposure to covid-19: Secondary | ICD-10-CM | POA: Diagnosis not present

## 2020-04-04 DIAGNOSIS — Z90710 Acquired absence of both cervix and uterus: Secondary | ICD-10-CM | POA: Diagnosis not present

## 2020-04-04 DIAGNOSIS — J4520 Mild intermittent asthma, uncomplicated: Secondary | ICD-10-CM | POA: Diagnosis not present

## 2020-04-04 DIAGNOSIS — R6889 Other general symptoms and signs: Secondary | ICD-10-CM | POA: Diagnosis not present

## 2020-04-04 DIAGNOSIS — I10 Essential (primary) hypertension: Secondary | ICD-10-CM | POA: Diagnosis not present

## 2020-04-04 DIAGNOSIS — G309 Alzheimer's disease, unspecified: Secondary | ICD-10-CM | POA: Diagnosis not present

## 2020-04-04 DIAGNOSIS — Z8249 Family history of ischemic heart disease and other diseases of the circulatory system: Secondary | ICD-10-CM | POA: Diagnosis not present

## 2020-04-04 DIAGNOSIS — E440 Moderate protein-calorie malnutrition: Secondary | ICD-10-CM | POA: Diagnosis not present

## 2020-04-04 DIAGNOSIS — F339 Major depressive disorder, recurrent, unspecified: Secondary | ICD-10-CM | POA: Diagnosis not present

## 2020-04-04 DIAGNOSIS — R32 Unspecified urinary incontinence: Secondary | ICD-10-CM | POA: Diagnosis not present

## 2020-04-04 DIAGNOSIS — Z6820 Body mass index (BMI) 20.0-20.9, adult: Secondary | ICD-10-CM | POA: Diagnosis not present

## 2020-04-04 DIAGNOSIS — R7401 Elevation of levels of liver transaminase levels: Secondary | ICD-10-CM | POA: Diagnosis not present

## 2020-04-04 DIAGNOSIS — F0280 Dementia in other diseases classified elsewhere without behavioral disturbance: Secondary | ICD-10-CM | POA: Diagnosis present

## 2020-04-04 DIAGNOSIS — Z79899 Other long term (current) drug therapy: Secondary | ICD-10-CM | POA: Diagnosis not present

## 2020-04-04 DIAGNOSIS — Z825 Family history of asthma and other chronic lower respiratory diseases: Secondary | ICD-10-CM | POA: Diagnosis not present

## 2020-04-04 DIAGNOSIS — M2550 Pain in unspecified joint: Secondary | ICD-10-CM | POA: Diagnosis not present

## 2020-04-04 MED ORDER — HALOPERIDOL LACTATE 5 MG/ML IJ SOLN
INTRAMUSCULAR | Status: AC
  Administered 2020-04-04: 2 mg via INTRAMUSCULAR
  Filled 2020-04-04: qty 1

## 2020-04-04 MED ORDER — LORAZEPAM 2 MG/ML IJ SOLN
INTRAMUSCULAR | Status: AC
  Administered 2020-04-04: 1 mg via INTRAMUSCULAR
  Filled 2020-04-04: qty 1

## 2020-04-04 MED ORDER — HALOPERIDOL LACTATE 5 MG/ML IJ SOLN: 2.0000 mg | Freq: Once | INTRAMUSCULAR | Status: AC

## 2020-04-04 MED ORDER — LORAZEPAM 2 MG/ML IJ SOLN: 1.0000 mg | Freq: Once | INTRAMUSCULAR | Status: AC

## 2020-04-04 NOTE — Progress Notes (Addendum)
[facility] PROGRESS NOTE  Sherry Hicks NOM:[id number] DOB: 1960-07-18 DOA: 01/27/2020 PCP: [name], DO    03/29/2020   Status:   Patient was under observation status, now meeting inpatient criteria due to needing frequent parental medication for agitation /hallucination  She  is difficult to place secondary to need for skilled nursing facility secondary to severe dementia.  Dispo: The patient is from: Home              Anticipated d/c is to: SNF              Anticipated d/c date is: > 3 days              Patient currently is not medically stable to d/c.  Barriers to discharge: Need to completely stabilize dementia behavioral issues with psychotropic medications before stable to discharge.  Still awaiting SNF bed offer   Difficult to place patient Yes  Level of care: Med-Surg  Code Status: Full Family Communication: Patient only DVT prophylaxis: Ambulatory and low risk for DVT Vaccination status: ? If has received Covid vaccines  Foley catheter: No  HPI: 60 y.o.Fwith Alzheimer's dementia with behavioral disturbance who presented with progressively erratic behavior over months.  See longer summary from H&P on 2/9, but in brief, patient had been living in [place] for years until family were informed by neighbors that patient was wandering, appeared unbathed, and she moved to [facility]. Here, she lived with daughter for about 2 years, but in the last few months, she started to behave more impulsively, to at times seem aggressive and have verbal outbursts, severe insomnia, and so APS recommended the patient be brought to the ER.  In the ER, the patient was seen by Psychiatry and started on new Seroquel. She was held pending geri-psych placement, which was delayed for several months.  Daytime behaviors improved on high dose Seroquel but continues with sundowning behaviors. Added Celexa as mood stabilizer along with Tegretol on 2/14   Subjective:  Still have  intermittent agitation/hallucination, yelling, requiring prn Im ativan/haldol She can not keep IV in, she can not tolerate tele monitor, she does not cooperate with EKG testing   Objective: Vitals:   04/04/20 0516 04/04/20 0937  BP: 127/90 121/76  Pulse: 87 73  Resp: 18 18  Temp: (!) 97.4 F (36.3 C)   SpO2: 100%     Intake/Output Summary (Last 24 hours) at 04/04/2020 1131 Last data filed at 04/04/2020 0201 Gross per 24 hour  Intake 240 ml  Output 0 ml  Net 240 ml   Filed Weights   03/26/20 1500 03/26/20 2244 03/31/20 2141  Weight: 58.8 kg 55.8 kg 55.8 kg    Exam:  Constitutional: Awake, laying on right side, no acute distress Respiratory: Clear to auscultation posteriorly, stable on room air Cardiovascular: Normal heart sounds, extremities warm, no peripheral edema, pulse regular Abdomen: Soft nontender nondistended with normoactive bowel sounds.  LBM 2/14 Neurologic: CN 2-12 grossly intact.  All extremities x4 without any focal neurological deficits.  Gait and ambulation not assessed. Psychiatric: Awake and alert.  Oriented times name only.  Currently not delusional or having active hallucinations.  **12:28 PM received page from nursing staff.  Attempting to change patient's bed linen when she became very agitated and was hallucinating.  Trying to get out of bed without assistance.  Gentle redirection was attempted but patient was pulling at staff's clothing and pushing staff hands away.  Patient was given oral Seroquel as needed per recent order  without any improvement in behaviors.  Orders given to try Haldol 5 mg IM x1.  After 1 hour if agitation and behaviors persist orders to give I am Ativan 1 mg x 1.   Assessment/Plan: Acute problems: Dementia with behavioral disturbance Behaviors seem improved without behaviors that would require IM Geodon, Haldol or Seroquel prn Continue high-dose Seroquel-QTC checked on 2/14 less than 500. Continue Seroquel PO prn for breakthrough  behavior Continue Celexa as mood stabilizer 2/17 noted resolution of daytime sleepiness with change in Tegretol dosing to 3 times per day: 200 mg 8 am and 2 pm and 400 mg 6 pm Continue scheduled Vistaril for anxiety and continue gabapentin Last PT note 2/5, unable to participate in rehab due to advanced dementia, but able to ambulate with 1 assist without difficulty.. 2/14 was reevaluated by PT and given her status of custodial care not appropriate for acute PT.  Was also evaluated by OT and patient did not have comprehension to participate with OT sessions and was actually actively hallucinating talking on the phone with her cousin therefore no acute OT services indicated as well. Initial TSH slightly elevated with normal free T3 and T4-repeat TSH on 2/14 within normal limits with normal free T4 -Obtain CMET, lipid panel within normal limits except for slightly elevated LDL of 111, A1c(for metabolic syndrome monitoring on new Seroquel)  Pressure injury Right anterior hip/Stage 2 -Add air mattress -Maximize nutrition (see below) and encourage mobility (OOB to chair/ambulate TID) Pressure Injury 04/01/20 Hip Anterior;Right Stage 2 -  Partial thickness loss of dermis presenting as a shallow open injury with a red, pink wound bed without slough. (Active)  Date First Assessed/Time First Assessed: 04/01/20 2230   Location: Hip  Location Orientation: Anterior;Right  Staging: Stage 2 -  Partial thickness loss of dermis presenting as a shallow open injury with a red, pink wound bed without slough.  Present ...    Assessments 04/01/2020 10:05 PM 04/04/2020 10:52 AM  Dressing Type Foam - Lift dressing to assess site every shift Foam - Lift dressing to assess site every shift  Dressing Clean;Dry;Intact Clean;Dry;Intact  Site / Wound Assessment Pink --  Drainage Amount None --     No Linked orders to display    Other problems: Moderate to severe protein calorie malnutrition -Continuefeeding  supplements -Give ice cream or Magic cup twice daily -Albumin 2.9 on 2/14  Transaminitis -Repeat LFTs ordered for 2/14 within normal limits   Data Reviewed: Basic Metabolic Panel: Recent Labs  Lab 03/29/20 0855  NA 140  K 4.4  CL 102  CO2 29  GLUCOSE 170*  BUN 20  CREATININE 0.88  CALCIUM 8.8*   Liver Function Tests: Recent Labs  Lab 03/29/20 0855  AST 20  ALT 31  ALKPHOS 64  BILITOT 0.7  PROT 5.4*  ALBUMIN 2.9*   No results for input(s): LIPASE, AMYLASE in the last 168 hours. No results for input(s): AMMONIA in the last 168 hours. CBC: Recent Labs  Lab 03/29/20 0855  WBC 3.6*  NEUTROABS 1.9  HGB 11.6*  HCT 36.7  MCV 99.7  PLT 277   Cardiac Enzymes: No results for input(s): CKTOTAL, CKMB, CKMBINDEX, TROPONINI in the last 168 hours. BNP (last 3 results) No results for input(s): BNP in the last 8760 hours.  ProBNP (last 3 results) No results for input(s): PROBNP in the last 8760 hours.  CBG: No results for input(s): GLUCAP in the last 168 hours.  No results found for this or any previous visit (  from the past 240 hour(s)).   Studies: No results found.  Scheduled Meds: . carbamazepine  200 mg Oral BID   And  . carbamazepine  400 mg Oral QPM  . citalopram  10 mg Oral Daily  . feeding supplement  237 mL Oral BID BM  . gabapentin  100 mg Oral BID  . haloperidol lactate  2 mg Intramuscular Once  . hydrOXYzine  25 mg Oral TID  . LORazepam  1 mg Intramuscular Once  . LORazepam  1 mg Intramuscular Once  . QUEtiapine  100 mg Oral BID  . QUEtiapine  150 mg Oral QHS   Continuous Infusions:  Principal Problem:   Dementia with behavioral disturbance (HCC) Active Problems:   Major depressive disorder, recurrent episode (HCC)   History of asthma, mild, intermittent   Early Onset Dementia with behavioral problem (HCC)   Protein-calorie malnutrition, moderate (HCC)   Transaminitis   Pressure injury of skin   Alzheimer disease  (HCC)   Consultants:  Psychiatry  Procedures:  None  Antibiotics: Anti-infectives (From admission, onward)   None       Time spent: 15 minutes    Albertine Grates MD PhD FACP [facility] Please refer to [facility] for contact info 0  days

## 2020-04-04 NOTE — Progress Notes (Signed)
Patient is extremely anxious, screaming out, and having conversations with individuals who are not there. Dr. Roda Shutters notified. Orders received.

## 2020-04-05 DIAGNOSIS — E440 Moderate protein-calorie malnutrition: Secondary | ICD-10-CM | POA: Diagnosis not present

## 2020-04-05 DIAGNOSIS — L89212 Pressure ulcer of right hip, stage 2: Secondary | ICD-10-CM | POA: Diagnosis not present

## 2020-04-05 DIAGNOSIS — F0391 Unspecified dementia with behavioral disturbance: Secondary | ICD-10-CM | POA: Diagnosis not present

## 2020-04-05 DIAGNOSIS — R7401 Elevation of levels of liver transaminase levels: Secondary | ICD-10-CM | POA: Diagnosis not present

## 2020-04-05 LAB — HLA B*5701: HLA B 5701: NEGATIVE

## 2020-04-05 MED ORDER — LORAZEPAM 0.5 MG PO TABS
0.5000 mg | ORAL_TABLET | Freq: Two times a day (BID) | ORAL | Status: DC
  Administered 2020-04-05 – 2020-04-07 (×6): 0.5 mg via ORAL
  Filled 2020-04-05 (×6): qty 1

## 2020-04-05 MED ORDER — CITALOPRAM HYDROBROMIDE 20 MG PO TABS
20.0000 mg | ORAL_TABLET | Freq: Every day | ORAL | Status: DC
  Administered 2020-04-06 – 2020-04-26 (×21): 20 mg via ORAL
  Filled 2020-04-05 (×21): qty 1

## 2020-04-05 MED ORDER — CARBAMAZEPINE 200 MG PO TABS
400.0000 mg | ORAL_TABLET | Freq: Every evening | ORAL | Status: DC
  Administered 2020-04-06 – 2020-06-10 (×66): 400 mg via ORAL
  Filled 2020-04-05 (×70): qty 2

## 2020-04-05 MED ORDER — CARBAMAZEPINE 200 MG PO TABS
400.0000 mg | ORAL_TABLET | Freq: Two times a day (BID) | ORAL | Status: DC
  Administered 2020-04-05 – 2020-06-10 (×133): 400 mg via ORAL
  Filled 2020-04-05 (×131): qty 2

## 2020-04-05 NOTE — Progress Notes (Signed)
Daughter at the bedside said anytime mother is too agitated give her a call she is will to come assist. She also will like daily updates on her mother her number is listed in the file. She gave information to help calm her mother down . She likes cookies(daughter brought them their at the bedside), strawberry ensures, and other snacks. She states patient can feed herself but needs assistance due to her not being able to hold the food steady.   Catheryn Slifer, RN

## 2020-04-05 NOTE — Progress Notes (Signed)
Patient's daughter Ovida Delagarza requested to speak with MD, MD has been notified and RN will continue to monitor this patient.

## 2020-04-05 NOTE — Progress Notes (Signed)
[facility] PROGRESS NOTE  Sherry Hicks SNK:[id number] DOB: 1960-05-22 DOA: 01/27/2020 PCP: [name], DO    03/29/2020   Status: The patient remains OBS appropriate and will d/c before 2 midnights.  Patient does not meet criteria for inpatient but is difficult to place secondary to need for skilled nursing facility secondary to severe dementia.  Dispo: The patient is from: Home              Anticipated d/c is to: SNF              Anticipated d/c date is: > 3 days              Patient currently is not medically stable to d/c.  Barriers to discharge: Need to completely stabilize dementia behavioral issues with psychotropic medications before stable to discharge.  Still awaiting SNF bed offer   Difficult to place patient Yes  Level of care: Med-Surg  Code Status: Full Family Communication: Patient only DVT prophylaxis: Ambulatory and low risk for DVT Vaccination status: ? If has received Covid vaccines  Foley catheter: No  HPI: 60 y.o.Fwith Alzheimer's dementia with behavioral disturbance who presented with progressively erratic behavior over months.  See longer summary from H&P on 2/9, but in brief, patient had been living in [place] for years until family were informed by neighbors that patient was wandering, appeared unbathed, and she moved to [facility]. Here, she lived with daughter for about 2 years, but in the last few months, she started to behave more impulsively, to at times seem aggressive and have verbal outbursts, severe insomnia, and so APS recommended the patient be brought to the ER.  In the ER, the patient was seen by Psychiatry and started on new Seroquel. She was held pending geri-psych placement, which was delayed for several months.  Daytime behaviors improved on high dose Seroquel but continues with sundowning behaviors. Added Celexa as mood stabilizer along with Tegretol on 2/14   Subjective: Awake and alert.  Initially mildly pleasant but  wants physical exam attempted patient became agitated and defiant.  No actual aggressive or started behavior but vocal tone definitely changed.  Eventually asked me to leave the room.  This is not typical behavior that I have seen for this patient since I have been caring for her over the past several weeks.  Objective: Vitals:   04/04/20 0937 04/04/20 1644  BP: 121/76 (!) 123/91  Pulse: 73 71  Resp: 18 18  Temp:    SpO2:      Intake/Output Summary (Last 24 hours) at 04/05/2020 9379 Last data filed at 04/05/2020 0700 Gross per 24 hour  Intake 360 ml  Output 0 ml  Net 360 ml   Filed Weights   03/26/20 1500 03/26/20 2244 03/31/20 2141  Weight: 58.8 kg 55.8 kg 55.8 kg    Exam:  Constitutional: Awake, no acute distress, calm until stimulated Respiratory: Lungs are clear to auscultation, stable on room air Cardiovascular: Normal heart sounds, pulses regular nontachycardic, extremities warm to touch without peripheral edema Abdomen: Soft and nontender although exam limited by patient's pushing examiner's hands away.  Unable to auscultate bowel sounds due to inability to complete exam.  LBM 2/20 Neurologic: CN 2-12 grossly intact.  All extremities x4 without any focal neurological deficits.  Gait and ambulation not assessed. Psychiatric: An alert.  Easily agitated especially when physical contact initiated for exam.  Oriented to name only.     Assessment/Plan: Acute problems: Dementia with behavioral disturbance Behaviors seem  improved without behaviors that would require IM Geodon, Haldol or Seroquel prn Continue high-dose Seroquel-QTC checked on 2/14 less than 500. Continue Seroquel PO prn for breakthrough behavior Continue Celexa as mood stabilizer and increased to 20 mg/day on 2/21 2/21 continued issues with recurrent agitation and sometimes aggressive behavior therefore will increase daytime Tegretol to 400 mg 8 am and 2 pm and 400 mg 6 pm.  Need to check LFTs in 1 week 2/21  we will discontinue scheduled Vistaril in favor of low-dose Ativan 0.5 mg twice daily.  Conntinue gabapentin 2/14 reevaluated by PT and given her status of custodial care not appropriate for acute PT.  Was also evaluated by OT and patient did not have comprehension to participate with OT sessions and was actually actively hallucinating talking on the phone with her cousin therefore no acute OT services indicated as well.  Pressure injury Right anterior hip/Stage 2 -Continue air mattress -Maximize nutrition (see below) and encourage mobility (OOB to chair/ambulate TID) Pressure Injury 04/01/20 Hip Anterior;Right Stage 2 -  Partial thickness loss of dermis presenting as a shallow open injury with a red, pink wound bed without slough. (Active)  Date First Assessed/Time First Assessed: 04/01/20 2230   Location: Hip  Location Orientation: Anterior;Right  Staging: Stage 2 -  Partial thickness loss of dermis presenting as a shallow open injury with a red, pink wound bed without slough.  Present ...    Assessments 04/01/2020 10:05 PM 04/05/2020  7:05 AM  Dressing Type Foam - Lift dressing to assess site every shift Foam - Lift dressing to assess site every shift  Dressing Clean;Dry;Intact Clean;Dry;Intact  Site / Wound Assessment Pink --  Drainage Amount None --     No Linked orders to display    Other problems: Moderate to severe protein calorie malnutrition -Continuefeeding supplements -Give ice cream or Magic cup twice daily -Albumin 2.9 on 2/14 Estimated body mass index is 19.86 kg/m as calculated from the following:   Height as of this encounter: 5\' 6"  (1.676 m).   Weight as of this encounter: 55.8 kg.  Transaminitis -Repeat LFTs ordered for 2/14 within normal limits   Data Reviewed: Basic Metabolic Panel: Recent Labs  Lab 03/29/20 0855  NA 140  K 4.4  CL 102  CO2 29  GLUCOSE 170*  BUN 20  CREATININE 0.88  CALCIUM 8.8*   Liver Function Tests: Recent Labs  Lab  03/29/20 0855  AST 20  ALT 31  ALKPHOS 64  BILITOT 0.7  PROT 5.4*  ALBUMIN 2.9*   No results for input(s): LIPASE, AMYLASE in the last 168 hours. No results for input(s): AMMONIA in the last 168 hours. CBC: Recent Labs  Lab 03/29/20 0855  WBC 3.6*  NEUTROABS 1.9  HGB 11.6*  HCT 36.7  MCV 99.7  PLT 277   Cardiac Enzymes: No results for input(s): CKTOTAL, CKMB, CKMBINDEX, TROPONINI in the last 168 hours. BNP (last 3 results) No results for input(s): BNP in the last 8760 hours.  ProBNP (last 3 results) No results for input(s): PROBNP in the last 8760 hours.  CBG: No results for input(s): GLUCAP in the last 168 hours.  No results found for this or any previous visit (from the past 240 hour(s)).   Studies: No results found.  Scheduled Meds: . carbamazepine  200 mg Oral BID   And  . carbamazepine  400 mg Oral QPM  . citalopram  10 mg Oral Daily  . feeding supplement  237 mL Oral BID BM  .  gabapentin  100 mg Oral BID  . hydrOXYzine  25 mg Oral TID  . LORazepam  1 mg Intramuscular Once  . QUEtiapine  100 mg Oral BID  . QUEtiapine  150 mg Oral QHS   Continuous Infusions:  Principal Problem:   Dementia with behavioral disturbance (HCC) Active Problems:   Major depressive disorder, recurrent episode (HCC)   History of asthma, mild, intermittent   Early Onset Dementia with behavioral problem (HCC)   Protein-calorie malnutrition, moderate (HCC)   Transaminitis   Pressure injury of skin   Alzheimer disease (HCC)   Consultants:  Psychiatry  Procedures:  None  Antibiotics: Anti-infectives (From admission, onward)   None       Time spent: 20 minutes    Junious Silk ANP  [facility] 7 am - 330 pm/M-F for direct patient care and secure chat Please refer to Amion for contact info 1  days

## 2020-04-05 NOTE — Progress Notes (Signed)
Patient refused ambulation, and continues to yell, scream, and holler during staff intervention, specifically during patient care the patient states "dont touch me." The patient's daughter Sherry Hicks visited the patient at bedside and was angry that her mother had not been up and walking around, RN educated the daughter that the patient was unable to safely ambulate due to her not following commands and not wanting to be touched by staff. RN will continue to monitor this staff

## 2020-04-06 DIAGNOSIS — L89212 Pressure ulcer of right hip, stage 2: Secondary | ICD-10-CM

## 2020-04-06 DIAGNOSIS — E440 Moderate protein-calorie malnutrition: Secondary | ICD-10-CM | POA: Diagnosis not present

## 2020-04-06 DIAGNOSIS — F0391 Unspecified dementia with behavioral disturbance: Secondary | ICD-10-CM | POA: Diagnosis not present

## 2020-04-06 NOTE — TOC Progression Note (Addendum)
Transition of Care [facility]) - Progression Note    Patient Details  Name: Sherry Hicks MRN: [id number] Date of Birth: October 12, 1960  Transition of Care [facility]) CM/SW Contact  Janae Bridgeman, RN Phone Number: 04/06/2020, 10:59 AM  Clinical Narrative:    Case management spoke with Phineas Semen, CM at [facility] and she is willing to view the patient's clinicals and establish if she is able to offer an available admission bed to the patient/family.  04/06/20 1239-  [facility] in [place], [place] has an available long term care bed open at the facility - faxed patient's clinicals to the facility to explore possible SNF admission opportunities.  CM and MSW is continuing to follow the patient for transitions of care and SNF placement - no bed offers at this point.   Expected Discharge Plan: Skilled Nursing Facility Barriers to Discharge: Continued Medical Work up,Barriers Unresolved (comment),SNF Pending bed offer  Expected Discharge Plan and Services Expected Discharge Plan: Skilled Nursing Facility In-house Referral: Clinical Social Work Discharge Planning Services: CM Consult Post Acute Care Choice: Skilled Nursing Facility Living arrangements for the past 2 months: Apartment                                       Social Determinants of Health (SDOH) Interventions    Readmission Risk Interventions Readmission Risk Prevention Plan 04/06/2020  Post Dischage Appt Complete  Medication Screening Complete  Transportation Screening Complete  Some recent data might be hidden

## 2020-04-06 NOTE — Progress Notes (Signed)
[facility] PROGRESS NOTE  HEAVYN Hicks DJS:[id number] DOB: 06/27/1960 DOA: 01/27/2020 PCP: [name], DO    03/29/2020   Status: The patient remains OBS appropriate and will d/c before 2 midnights.  Patient does not meet criteria for inpatient but is difficult to place secondary to need for skilled nursing facility secondary to severe dementia.  Dispo:               The patient is from: Home              Anticipated d/c is to: SNF              Anticipated d/c date is: > 3 days              Patient currently is not medically stable to d/c.  Barriers to discharge: Need to completely stabilize dementia behavioral issues with psychotropic medications before stable to discharge.  Still awaiting SNF bed offer   Difficult to place patient Yes  Level of care: Med-Surg  Code Status: Full Family Communication: Daughter was at bedside on 2/20 1 in the evening to visit patient for her birthday.  She expressed frustration over care given to patient by nursing staff as well as frustration over lack of communication document that daughter inconsistently answers the phone.  Dr. Tyson Babinski did speak with the patient's daughter and she was primarily concerned over the fact that her mother was not walking and requested an additional PT evaluation.  Today on 2/22 LCSW spoke at length with daughter regarding her frustrations and concerns.  Also today on 2/22 within 1 hour of that conversation with the LCSW I called the daughter who did not answer.  I was sent to voicemail.  I left a detailed HIPPA appropriate message after confirming that this was daughter's cell phone.  I informed her how I would address her mother's incontinence by utilizing mesh panties with a peripad.  Explained from a clinical standpoint that because of her dementia she was unable to appropriately participate with OT or PT therapies.  Reminded her that despite her visiting yesterday due to the severity of her dementia her  mother did not remember the visit.  Also explained to daughter that given her dementia she is quite fearful of people she does not know touching her and providing personal care.  I also encouraged the daughter that she hopefully will improve with these behaviors as her psychotropic medications become more therapeutic which typically can take several weeks. DVT prophylaxis: Ambulatory and low risk for DVT Vaccination status: ? If has received Covid vaccines  Foley catheter: No  HPI: 60 y.o.Fwith Alzheimer's dementia with behavioral disturbance who presented with progressively erratic behavior over months.  See longer summary from H&P on 2/9, but in brief, patient had been living in [place] for years until family were informed by neighbors that patient was wandering, appeared unbathed, and she moved to [facility]. Here, she lived with daughter for about 2 years, but in the last few months, she started to behave more impulsively, to at times seem aggressive and have verbal outbursts, severe insomnia, and so APS recommended the patient be brought to the ER.  In the ER, the patient was seen by Psychiatry and started on new Seroquel. She was held pending geri-psych placement, which was delayed for several months.  Daytime behaviors improved on high dose Seroquel but continues with sundowning behaviors. Added Celexa as mood stabilizer along with Tegretol on 2/14   Subjective: Awakened and much more  pleasant today than yesterday.  Did allow me to touch her and perform appropriate physical exam without becoming agitated.  Objective: Vitals:   04/05/20 2034 04/06/20 0649  BP: 125/87 105/79  Pulse: 92 61  Resp: 18 16  Temp: 98.4 F (36.9 C) 97.6 F (36.4 C)  SpO2: 98% 99%    Intake/Output Summary (Last 24 hours) at 04/06/2020 1761 Last data filed at 04/05/2020 1958 Gross per 24 hour  Intake 1071 ml  Output --  Net 1071 ml   Filed Weights   03/26/20 1500 03/26/20 2244 03/31/20 2141   Weight: 58.8 kg 55.8 kg 55.8 kg    Exam:  Constitutional: Awake, pleasant today, and more appropriate Respiratory: Lungs are clear to auscultation, she is stable on room air Cardiovascular: Heart sounds are normal and nontachycardic, extremities warm and dry without peripheral edema Abdomen: Soft and nontender with appropriate bowel sounds.  Oral intake remains marginal even when fed.  LBM 2/21 Neurologic: CN 2-12 grossly intact.  All extremities x4 without any focal neurological deficits.  Gait and ambulation not assessed. Psychiatric: Awake and alert.  More pleasant today.  Oriented to name only.     Assessment/Plan: Acute problems: Dementia with behavioral disturbance Behaviors seem improved without behaviors that would require IM Geodon, Haldol or Seroquel prn Continue high-dose Seroquel-QTC checked on 2/14 less than 500. Continue Seroquel PO prn for breakthrough behaviors Celexa (mood stabilizer) increased to 20 mg/day on 2/21 2/21 continued issues with recurrent agitation and sometimes aggressive behavior therefore will increase daytime Tegretol to 400 mg 8 am and 2 pm and 400 mg 6 pm.  Need to check LFTs in 1 week 2/21 Discontinued scheduled Vistaril in favor of low-dose Ativan 0.5 mg twice daily.  Conntinue gabapentin 2/14 reevaluated by PT and given her status of custodial care not appropriate for acute PT.  Was also evaluated by OT and patient did not have comprehension to participate with OT sessions and was actually actively hallucinating talking on the phone with her cousin therefore no acute OT services indicated as well.  Pressure injury Right anterior hip/Stage 2 -Continue air mattress -Maximize nutrition (see below) and encourage mobility (OOB to chair/ambulate TID) Pressure Injury 04/01/20 Hip Anterior;Right Stage 2 -  Partial thickness loss of dermis presenting as a shallow open injury with a red, pink wound bed without slough. (Active)  Date First Assessed/Time  First Assessed: 04/01/20 2230   Location: Hip  Location Orientation: Anterior;Right  Staging: Stage 2 -  Partial thickness loss of dermis presenting as a shallow open injury with a red, pink wound bed without slough.  Present ...    Assessments 04/01/2020 10:05 PM 04/05/2020  8:34 PM  Dressing Type Foam - Lift dressing to assess site every shift Foam - Lift dressing to assess site every shift  Dressing Clean;Dry;Intact --  Site / Wound Assessment Pink --  Drainage Amount None --     No Linked orders to display   Urinary incontinence -Pericare and other personal care difficult to deliver to patient given her underlying dementia and fear of strangers providing such care -We will try to utilize mesh panties with peripads to minimize degree of personal-care to be given i.e. need to change clothes or change bed linens from significant incontinence of urine and or stool  Other problems: Moderate to severe protein calorie malnutrition -Continuefeeding supplements -Give ice cream or Magic cup twice daily -Albumin 2.9 on 2/14 Estimated body mass index is 19.86 kg/m as calculated from the following:  Height as of this encounter: 5\' 6"  (1.676 m).   Weight as of this encounter: 55.8 kg.  Transaminitis -Repeat LFTs ordered for 2/14 within normal limits   Data Reviewed: Basic Metabolic Panel: No results for input(s): NA, K, CL, CO2, GLUCOSE, BUN, CREATININE, CALCIUM, MG, PHOS in the last 168 hours. Liver Function Tests: No results for input(s): AST, ALT, ALKPHOS, BILITOT, PROT, ALBUMIN in the last 168 hours. No results for input(s): LIPASE, AMYLASE in the last 168 hours. No results for input(s): AMMONIA in the last 168 hours. CBC: No results for input(s): WBC, NEUTROABS, HGB, HCT, MCV, PLT in the last 168 hours. Cardiac Enzymes: No results for input(s): CKTOTAL, CKMB, CKMBINDEX, TROPONINI in the last 168 hours. BNP (last 3 results) No results for input(s): BNP in the last 8760  hours.  ProBNP (last 3 results) No results for input(s): PROBNP in the last 8760 hours.  CBG: No results for input(s): GLUCAP in the last 168 hours.  No results found for this or any previous visit (from the past 240 hour(s)).   Studies: No results found.  Scheduled Meds: . carbamazepine  400 mg Oral BID   And  . carbamazepine  400 mg Oral QPM  . citalopram  20 mg Oral Daily  . feeding supplement  237 mL Oral BID BM  . gabapentin  100 mg Oral BID  . LORazepam  1 mg Intramuscular Once  . LORazepam  0.5 mg Oral BID  . QUEtiapine  100 mg Oral BID  . QUEtiapine  150 mg Oral QHS   Continuous Infusions:  Principal Problem:   Dementia with behavioral disturbance (HCC) Active Problems:   Major depressive disorder, recurrent episode (HCC)   History of asthma, mild, intermittent   Early Onset Dementia with behavioral problem (HCC)   Protein-calorie malnutrition, moderate (HCC)   Transaminitis   Pressure injury of skin   Alzheimer disease (HCC)   Consultants:  Psychiatry  Procedures:  None  Antibiotics: Anti-infectives (From admission, onward)   None       Time spent: 20 minutes    3/14 ANP  [facility] 7 am - 330 pm/M-F for direct patient care and secure chat Please refer to Amion for contact info 2  days

## 2020-04-06 NOTE — Plan of Care (Signed)
  Problem: Education: Goal: Knowledge of General Education information will improve Description Including pain rating scale, medication(s)/side effects and non-pharmacologic comfort measures Outcome: Progressing   

## 2020-04-06 NOTE — Progress Notes (Addendum)
2:30pm: CSW spoke with Leeanne Mannan at [facility] - she agreed to review referral for memory care.  CSW sent clinicals for review.  10:40am: CSW spoke with patient's daughter [name] to address her concerns - daughter reports during her visits on 2/18 and 2/21 the patient was covered in urine. [name] concerned about the patient no longer having a sitter at bedside, but a telesitter instead. [name] concerned with lack of communication from medical staff. CSW explained placement efforts and barriers to discharge - DTP NP aware of information and will reach out to [name]  8am: CSW spoke with patient's daughter - daughter requested a return call later this morning for discussion.  Edwin Dada, MSW, LCSW Transitions of Care  Clinical Social Worker II [phone]

## 2020-04-06 NOTE — Progress Notes (Signed)
PT Cancellation Note/DC  Patient Details Name: Sherry Hicks MRN: [id number] DOB: January 28, 1961   Cancelled Treatment:    Reason Eval/Treat Not Completed: Other (comment) (agitation) patient in her room yelling "come on boy, come on COME ON LET'S GO!!!" with no one else in the room; observed her behaviors with nursing staff trying to attend to her however her behavior continued to escalate and she became more verbally and physically aggressive. Not appropriate for PT today given agitation levels and behavior. Per nursing notes, she has had ongoing aggressive behaviors which prevent her safely mobilizing with nursing staff and frankly  With PT as well. Also note that PT has signed off of her case several times including 03/20/20, 03/27/20, and 03/29/20 due to being at baseline/custodial care level at the SNF level.   At this time she is not an appropriate candidate for therapy given behaviors and inability to follow cues/commands. Signing off. Thank you for the referral!   Lerry Liner PT, DPT, PN1   Supplemental Physical Therapist [facility]    Pager [phone] Acute Rehab Office [phone]

## 2020-04-07 DIAGNOSIS — L89212 Pressure ulcer of right hip, stage 2: Secondary | ICD-10-CM | POA: Diagnosis not present

## 2020-04-07 DIAGNOSIS — E440 Moderate protein-calorie malnutrition: Secondary | ICD-10-CM | POA: Diagnosis not present

## 2020-04-07 DIAGNOSIS — F0391 Unspecified dementia with behavioral disturbance: Secondary | ICD-10-CM | POA: Diagnosis not present

## 2020-04-07 NOTE — Plan of Care (Signed)
  Problem: Clinical Measurements: Goal: Ability to maintain clinical measurements within normal limits will improve Outcome: Progressing Goal: Will remain free from infection Outcome: Progressing Goal: Diagnostic test results will improve Outcome: Progressing Goal: Respiratory complications will improve Outcome: Progressing Goal: Cardiovascular complication will be avoided Outcome: Progressing   Problem: Coping: Goal: Level of anxiety will decrease Outcome: Progressing   Problem: Elimination: Goal: Will not experience complications related to bowel motility Outcome: Progressing Goal: Will not experience complications related to urinary retention Outcome: Progressing   Problem: Pain Managment: Goal: General experience of comfort will improve Outcome: Progressing   Problem: Safety: Goal: Ability to remain free from injury will improve Outcome: Progressing   

## 2020-04-07 NOTE — Progress Notes (Addendum)
[facility] PROGRESS NOTE  COZY VEALE CLE:[id number] DOB: [birth year] DOA: 01/27/2020 PCP: [name], DO    03/29/2020   Status: The patient remains OBS appropriate and will d/c before 2 midnights.  Patient does not meet criteria for inpatient but is difficult to place secondary to need for skilled nursing facility secondary to severe dementia.  Dispo:               The patient is from: Home              Anticipated d/c is to: SNF              Anticipated d/c date is: > 3 days              Patient currently is not medically stable to d/c.  Barriers to discharge: Need to completely stabilize dementia behavioral issues with psychotropic medications before stable to discharge.  Still awaiting SNF bed offer   Difficult to place patient Yes  Level of care: Med-Surg  Code Status: Full Family Communication: 2/23 able to reach daughter by telephone.  Had very pleasant conversation regarding her mother's care.  She is concerned that she is experiencing daytime sleepiness in context of initiation of Ativan.  Discussed with her that if patient remains sleepy in the daytime hours by 2/24 we would likely stop daytime dose and only give at bedtime.  She verbalized understanding.  States she plans on coming back today to visit her mother. DVT prophylaxis: Ambulatory and low risk for DVT Vaccination status: ? If has received Covid vaccines  Foley catheter: No  HPI: 60 y.o.Fwith Alzheimer's dementia with behavioral disturbance who presented with progressively erratic behavior over months.  See longer summary from H&P on 2/9, but in brief, patient had been living in [place] for years until family were informed by neighbors that patient was wandering, appeared unbathed, and she moved to [facility]. Here, she lived with daughter for about 2 years, but in the last few months, she started to behave more impulsively, to at times seem aggressive and have verbal outbursts, severe insomnia,  and so APS recommended the patient be brought to the ER.  In the ER, the patient was seen by Psychiatry and started on new Seroquel. She was held pending geri-psych placement, which was delayed for several months.  Daytime behaviors improved on high dose Seroquel but continues with sundowning behaviors. Added Celexa as mood stabilizer along with Tegretol on 2/14   Subjective: Sleeping.  Awakened.  Not combative, restless or verbally abusive.    Objective: Vitals:   04/06/20 1949 04/07/20 0439  BP: 99/64 (!) 95/52  Pulse: 63 62  Resp: 16 16  Temp: 98.5 F (36.9 C) (!) 97.5 F (36.4 C)  SpO2: 99% 100%    Intake/Output Summary (Last 24 hours) at 04/07/2020 0810 Last data filed at 04/06/2020 2231 Gross per 24 hour  Intake 60 ml  Output -  Net 60 ml   Filed Weights   03/26/20 2244 03/31/20 2141 04/06/20 1949  Weight: 55.8 kg 55.8 kg 52.7 kg    Exam:  Constitutional: Awakened, calm Respiratory: Anterior lung sounds are clear, stable on room air Cardiovascular: Heart sounds are normal, pulses regular nontachycardic.  Skin warm and dry Abdomen: Abdomen is soft nontender nondistended with normoactive bowel sounds.  Marginal oral intake.  LBM 2/21 Neurologic: CN 2-12 grossly intact.  All extremities x4 without any focal neurological deficits.  Gait and ambulation not assessed. Psychiatric: Sleeping awakened.  Oriented times  name only.  Currently calm without any anxious, paranoid or assertive behaviors     Assessment/Plan: Acute problems: Dementia with behavioral disturbance Behaviors seem improved without behaviors that would require IM Geodon, Haldol or Seroquel prn Continue high-dose Seroquel-QTC checked on 2/14 less than 500. Continue Seroquel PO prn for breakthrough behaviors Celexa (mood stabilizer) increased to 20 mg/day on 2/21 2/21 continued issues with recurrent agitation and sometimes aggressive behavior therefore will increase daytime Tegretol to 400 mg 8 am  and 2 pm and 400 mg 6 pm.  Need to check LFTs in 1 week 2/21 Discontinued scheduled Vistaril in favor of low-dose Ativan 0.5 mg twice daily.  Conntinue gabapentin 2/14 reevaluated by PT and given her status of custodial care not appropriate for acute PT.  Was also evaluated by OT and patient did not have comprehension to participate with OT sessions and was actually actively hallucinating talking on the phone with her cousin therefore no acute OT services indicated as well.  Pressure injury Right anterior hip/Stage 2 -Continue air mattress -Maximize nutrition (see below) and encourage mobility (OOB to chair/ambulate TID) Pressure Injury 04/01/20 Hip Anterior;Right Stage 2 -  Partial thickness loss of dermis presenting as a shallow open injury with a red, pink wound bed without slough. (Active)  Date First Assessed/Time First Assessed: 04/01/20 2230   Location: Hip  Location Orientation: Anterior;Right  Staging: Stage 2 -  Partial thickness loss of dermis presenting as a shallow open injury with a red, pink wound bed without slough.  Present ...    Assessments 04/01/2020 10:05 PM 04/06/2020 10:00 PM  Dressing Type Foam - Lift dressing to assess site every shift Foam - Lift dressing to assess site every shift  Dressing Clean;Dry;Intact -  Site / Wound Assessment Pink -  Drainage Amount None -     No Linked orders to display   Urinary incontinence -Pericare and other personal care difficult to deliver to patient given her underlying dementia and fear of strangers providing such care -We will try to utilize mesh panties with peripads to minimize degree of personal-care to be given i.e. need to change clothes or change bed linens from significant incontinence of urine and or stool  Other problems: Moderate to severe protein calorie malnutrition -Continuefeeding supplements -Give ice cream or Magic cup twice daily -Albumin 2.9 on 2/14 Estimated body mass index is 18.75 kg/m as calculated from  the following:   Height as of this encounter: 5\' 6"  (1.676 m).   Weight as of this encounter: 52.7 kg.  Transaminitis -Repeat LFTs ordered for 2/14 within normal limits   Data Reviewed: Basic Metabolic Panel: No results for input(s): NA, K, CL, CO2, GLUCOSE, BUN, CREATININE, CALCIUM, MG, PHOS in the last 168 hours. Liver Function Tests: No results for input(s): AST, ALT, ALKPHOS, BILITOT, PROT, ALBUMIN in the last 168 hours. No results for input(s): LIPASE, AMYLASE in the last 168 hours. No results for input(s): AMMONIA in the last 168 hours. CBC: No results for input(s): WBC, NEUTROABS, HGB, HCT, MCV, PLT in the last 168 hours. Cardiac Enzymes: No results for input(s): CKTOTAL, CKMB, CKMBINDEX, TROPONINI in the last 168 hours. BNP (last 3 results) No results for input(s): BNP in the last 8760 hours.  ProBNP (last 3 results) No results for input(s): PROBNP in the last 8760 hours.  CBG: No results for input(s): GLUCAP in the last 168 hours.  No results found for this or any previous visit (from the past 240 hour(s)).   Studies: No results  found.  Scheduled Meds: . carbamazepine  400 mg Oral BID   And  . carbamazepine  400 mg Oral QPM  . citalopram  20 mg Oral Daily  . feeding supplement  237 mL Oral BID BM  . gabapentin  100 mg Oral BID  . LORazepam  1 mg Intramuscular Once  . LORazepam  0.5 mg Oral BID  . QUEtiapine  100 mg Oral BID  . QUEtiapine  150 mg Oral QHS   Continuous Infusions:  Principal Problem:   Dementia with behavioral disturbance (HCC) Active Problems:   Major depressive disorder, recurrent episode (HCC)   History of asthma, mild, intermittent   Early Onset Dementia with behavioral problem (HCC)   Protein-calorie malnutrition, moderate (HCC)   Transaminitis   Pressure injury of skin   Alzheimer disease (HCC)   Pressure ulcer of right hip, stage 2 (HCC)   Consultants:  Psychiatry  Procedures:  None  Antibiotics: Anti-infectives (From  admission, onward)   None       Time spent: 20 minutes    Junious Silk ANP  [facility] 7 am - 330 pm/M-F for direct patient care and secure chat Please refer to Amion for contact info 3  days

## 2020-04-07 NOTE — Progress Notes (Signed)
Patient appears to respond well to calm, low stim. environment.  She is not capable of comprehending tasks or following more than a one-step command.  She prefers sweets to any other type of food and requires feeding at times.  She has delusions and is often seen talking to persons not in the room and babbling incoherently.  Becomes physically and verbally aggressive when needing toiletiing or changing.  Responds positively to positive reinforcement, praise, and a quiet approach.

## 2020-04-07 NOTE — Progress Notes (Addendum)
2:15pm: CSW received return call from [facility] - facility unable to offer the patient a bed at this time.  10am: CSW spoke with Leeanne Mannan at [facility] - referral received and currently under review at this time.  Edwin Dada, MSW, LCSW Transitions of Care  Clinical Social Worker II [phone]

## 2020-04-08 DIAGNOSIS — F0391 Unspecified dementia with behavioral disturbance: Secondary | ICD-10-CM | POA: Diagnosis not present

## 2020-04-08 DIAGNOSIS — L89212 Pressure ulcer of right hip, stage 2: Secondary | ICD-10-CM | POA: Diagnosis not present

## 2020-04-08 DIAGNOSIS — E440 Moderate protein-calorie malnutrition: Secondary | ICD-10-CM | POA: Diagnosis not present

## 2020-04-08 MED ORDER — LORAZEPAM 0.5 MG PO TABS
0.5000 mg | ORAL_TABLET | Freq: Every day | ORAL | Status: DC
  Administered 2020-04-08: 0.5 mg via ORAL
  Filled 2020-04-08: qty 1

## 2020-04-08 MED ORDER — HALOPERIDOL LACTATE 5 MG/ML IJ SOLN: 1.0000 mg | Freq: Four times a day (QID) | INTRAMUSCULAR | Status: DC | PRN

## 2020-04-08 MED ORDER — HALOPERIDOL LACTATE 5 MG/ML IJ SOLN
2.0000 mg | Freq: Four times a day (QID) | INTRAMUSCULAR | Status: DC | PRN
  Administered 2020-04-09 – 2020-05-04 (×11): 2 mg via INTRAMUSCULAR
  Filled 2020-04-08 (×14): qty 1

## 2020-04-08 NOTE — Progress Notes (Signed)
[facility] PROGRESS NOTE  Sherry Hicks ATF:[id number] DOB: 09/08/1960 DOA: 01/27/2020 PCP: [name], DO    03/29/2020   Status: The patient remains OBS appropriate and will d/c before 2 midnights.  Patient does not meet criteria for inpatient but is difficult to place secondary to need for skilled nursing facility secondary to severe dementia.  Dispo:               The patient is from: Home              Anticipated d/c is to: SNF              Anticipated d/c date is: > 3 days              Patient currently is not medically stable to d/c.  Barriers to discharge: Need to completely stabilize dementia behavioral issues with psychotropic medications before stable to discharge.  Still awaiting SNF bed offer   Difficult to place patient Yes  Level of care: Med-Surg  Code Status: Full Family Communication: 2/23 able to reach daughter by telephone.  Had very pleasant conversation regarding her mother's care.  She is concerned that she is experiencing daytime sleepiness in context of initiation of Ativan.  Discussed with her that if patient remains sleepy in the daytime hours by 2/24 we would likely stop daytime dose and only give at bedtime.  She verbalized understanding.  States she plans on coming back today to visit her mother. DVT prophylaxis: Ambulatory and low risk for DVT Vaccination status: ? If has received Covid vaccines  Foley catheter: No  HPI: 60 y.o.Fwith Alzheimer's dementia with behavioral disturbance who presented with progressively erratic behavior over months.  See longer summary from H&P on 2/9, but in brief, patient had been living in [place] for years until family were informed by neighbors that patient was wandering, appeared unbathed, and she moved to [facility]. Here, she lived with daughter for about 2 years, but in the last few months, she started to behave more impulsively, to at times seem aggressive and have verbal outbursts, severe insomnia,  and so APS recommended the patient be brought to the ER.  In the ER, the patient was seen by Psychiatry and started on new Seroquel. She was held pending geri-psych placement, which was delayed for several months.  Daytime behaviors improved on high dose Seroquel but continues with sundowning behaviors. Added Celexa as mood stabilizer along with Tegretol on 2/14   Subjective: Still sleepy this morning so decision made to discontinue a.m. Ativan and only continue at bedtime.  Patient awakened during exam.  Pleasant but very confused.   Objective: Vitals:   04/07/20 2035 04/08/20 0513  BP: 119/78 (!) 158/96  Pulse: 96 66  Resp: 16 16  Temp: 99.6 F (37.6 C)   SpO2: 100% 100%    Intake/Output Summary (Last 24 hours) at 04/08/2020 0755 Last data filed at 04/07/2020 1700 Gross per 24 hour  Intake 480 ml  Output --  Net 480 ml   Filed Weights   03/26/20 2244 03/31/20 2141 04/06/20 1949  Weight: 55.8 kg 55.8 kg 52.7 kg    Exam:  Constitutional: Awakened, calm and no distress or agitation Respiratory: Lungs clear, she is stable on room air Cardiovascular: Pulse regular, normotensive, normal heart sounds Abdomen: Abdomen soft and bowel sounds are present.  Marginal intake.  Prefers sweets and about 50% of the time needs to be fed or encouraged to eat.  LBM 2/21 Neurologic: CN 2-12  grossly intact.  All extremities x4 without any focal neurological deficits.  Gait and ambulation not assessed. Psychiatric: Patient sleepy but awakens.  Oriented to name only.  Currently pleasant and noncombative or restless.     Assessment/Plan: Acute problems: Dementia with behavioral disturbance Behaviors seem improved without behaviors that would require IM Geodon, Haldol or Seroquel prn Continue high-dose Seroquel-QTC checked on 2/14 less than 500. Continue Seroquel PO prn for breakthrough behaviors Celexa (mood stabilizer) increased to 20 mg/day on 2/21 2/21 continued issues with recurrent  agitation and sometimes aggressive behavior therefore will increase daytime Tegretol to 400 mg 8 am and 2 pm and 400 mg 6 pm.  Need to check LFTs in 1 week 2/21 Discontinued scheduled Vistaril in favor of low-dose Ativan 0.5 mg twice daily.  And on 2/24 due to what appears to be excessive daytime sleepiness Ativan will be changed to at bedtime only Continue gabapentin 2/14 reevaluated by PT and given her status of custodial care not appropriate for acute PT.  Was also evaluated by OT and patient did not have comprehension to participate with OT sessions and was actually actively hallucinating talking on the phone with her cousin therefore no acute OT services indicated as well.  Pressure injury Right anterior hip/Stage 2 -Continue air mattress -Maximize nutrition (see below) and encourage mobility (OOB to chair/ambulate TID) Pressure Injury 04/01/20 Hip Anterior;Right Stage 2 -  Partial thickness loss of dermis presenting as a shallow open injury with a red, pink wound bed without slough. (Active)  Date First Assessed/Time First Assessed: 04/01/20 2230   Location: Hip  Location Orientation: Anterior;Right  Staging: Stage 2 -  Partial thickness loss of dermis presenting as a shallow open injury with a red, pink wound bed without slough.  Present ...    Assessments 04/01/2020 10:05 PM 04/07/2020  7:30 PM  Dressing Type Foam - Lift dressing to assess site every shift Foam - Lift dressing to assess site every shift  Dressing Clean;Dry;Intact Clean;Dry;Intact  Site / Wound Assessment Pink Pink  Drainage Amount None None     No Linked orders to display   Urinary incontinence -Pericare and other personal care difficult to deliver to patient given her underlying dementia and fear of strangers providing such care -We will try to utilize mesh panties with peripads to minimize degree of personal-care to be given i.e. need to change clothes or change bed linens from significant incontinence of urine and or  stool  Other problems: Moderate to severe protein calorie malnutrition -Continuefeeding supplements -Give ice cream or Magic cup twice daily -Albumin 2.9 on 2/14 Estimated body mass index is 18.75 kg/m as calculated from the following:   Height as of this encounter: 5\' 6"  (1.676 m).   Weight as of this encounter: 52.7 kg.  Transaminitis -Repeat LFTs ordered for 2/14 within normal limits   Data Reviewed: Basic Metabolic Panel: No results for input(s): NA, K, CL, CO2, GLUCOSE, BUN, CREATININE, CALCIUM, MG, PHOS in the last 168 hours. Liver Function Tests: No results for input(s): AST, ALT, ALKPHOS, BILITOT, PROT, ALBUMIN in the last 168 hours. No results for input(s): LIPASE, AMYLASE in the last 168 hours. No results for input(s): AMMONIA in the last 168 hours. CBC: No results for input(s): WBC, NEUTROABS, HGB, HCT, MCV, PLT in the last 168 hours. Cardiac Enzymes: No results for input(s): CKTOTAL, CKMB, CKMBINDEX, TROPONINI in the last 168 hours. BNP (last 3 results) No results for input(s): BNP in the last 8760 hours.  ProBNP (last 3  results) No results for input(s): PROBNP in the last 8760 hours.  CBG: No results for input(s): GLUCAP in the last 168 hours.  No results found for this or any previous visit (from the past 240 hour(s)).   Studies: No results found.  Scheduled Meds: . carbamazepine  400 mg Oral BID   And  . carbamazepine  400 mg Oral QPM  . citalopram  20 mg Oral Daily  . feeding supplement  237 mL Oral BID BM  . gabapentin  100 mg Oral BID  . LORazepam  1 mg Intramuscular Once  . LORazepam  0.5 mg Oral QHS  . QUEtiapine  100 mg Oral BID  . QUEtiapine  150 mg Oral QHS   Continuous Infusions:  Principal Problem:   Dementia with behavioral disturbance (HCC) Active Problems:   Major depressive disorder, recurrent episode (HCC)   History of asthma, mild, intermittent   Early Onset Dementia with behavioral problem (HCC)   Protein-calorie  malnutrition, moderate (HCC)   Transaminitis   Pressure injury of skin   Alzheimer disease (HCC)   Pressure ulcer of right hip, stage 2 (HCC)   Consultants:  Psychiatry  Procedures:  None  Antibiotics: Anti-infectives (From admission, onward)   None       Time spent: 20 minutes    Junious Silk ANP  [facility] 7 am - 330 pm/M-F for direct patient care and secure chat Please refer to Amion for contact info 4  days

## 2020-04-08 NOTE — TOC Progression Note (Addendum)
Transition of Care [facility]) - Progression Note    Patient Details  Name: Sherry Hicks MRN: [id number] Date of Birth: Aug 03, 1960  Transition of Care [facility]) CM/SW [place], RN Phone Number: 04/08/2020, 11:58 AM  Clinical Narrative:    Case management met with the patient at the bedside regarding transitions of care.  The patient was quiet this morning with assessment.  CM left a message with [facility] to inquire about bed availability.  I also called [facility] and faxed patient's clinicals on the hub.  I left a message with Jackelyn Poling, CM at [facility] and asked that she review the patient's clinicals for possible admission.  CM and MSW will continue to follow the patient for SNF placement.  04/08/20 1245 - Debbie, CM at [facility] declined the patient for admission.   Expected Discharge Plan: [place] Barriers to Discharge: Continued Medical Work up,Barriers Unresolved (comment),SNF Pending bed offer  Expected Discharge Plan and Services Expected Discharge Plan: [place] In-house Referral: Clinical Social Work Discharge Planning Services: CM Consult Post Acute Care Choice: [place] Living arrangements for the past 2 months: Apartment                                       Social Determinants of Health (SDOH) Interventions    Readmission Risk Interventions Readmission Risk Prevention Plan 04/06/2020  Post Dischage Appt Complete  Medication Screening Complete  Transportation Screening Complete  Some recent data might be hidden

## 2020-04-09 DIAGNOSIS — E440 Moderate protein-calorie malnutrition: Secondary | ICD-10-CM | POA: Diagnosis not present

## 2020-04-09 DIAGNOSIS — L89212 Pressure ulcer of right hip, stage 2: Secondary | ICD-10-CM | POA: Diagnosis not present

## 2020-04-09 DIAGNOSIS — F0391 Unspecified dementia with behavioral disturbance: Secondary | ICD-10-CM | POA: Diagnosis not present

## 2020-04-09 MED ORDER — LORAZEPAM 0.5 MG PO TABS
0.5000 mg | ORAL_TABLET | Freq: Two times a day (BID) | ORAL | Status: DC
  Administered 2020-04-09 – 2020-04-26 (×36): 0.5 mg via ORAL
  Filled 2020-04-09 (×37): qty 1

## 2020-04-09 NOTE — Progress Notes (Addendum)
CSW attempted to reach [place] at [facility] without success - a voicemail was left requesting a return call.  CSW faxed clinical information to [facility] via the HUB for review.  Edwin Dada, MSW, LCSW Transitions of Care  Clinical Social Worker II [phone]
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
## 2020-04-09 NOTE — Progress Notes (Signed)
Patient calm at this time.  Patient calm during bath, pericare, and complete linen change this shift.  She will follow simple commands, answers verbally sometimes.

## 2020-04-10 DIAGNOSIS — F0391 Unspecified dementia with behavioral disturbance: Secondary | ICD-10-CM | POA: Diagnosis not present

## 2020-04-10 DIAGNOSIS — E440 Moderate protein-calorie malnutrition: Secondary | ICD-10-CM | POA: Diagnosis not present

## 2020-04-10 DIAGNOSIS — F0280 Dementia in other diseases classified elsewhere without behavioral disturbance: Secondary | ICD-10-CM

## 2020-04-10 DIAGNOSIS — F330 Major depressive disorder, recurrent, mild: Secondary | ICD-10-CM | POA: Diagnosis not present

## 2020-04-10 DIAGNOSIS — G309 Alzheimer's disease, unspecified: Secondary | ICD-10-CM | POA: Diagnosis not present

## 2020-04-10 NOTE — Progress Notes (Signed)
PROGRESS NOTE  Sherry Hicks PPI:[id number] DOB: 1960/10/04 DOA: 01/27/2020 PCP: [name], DO   LOS: 6 days   Brief narrative:  60 years old female with past medical history of Alzheimer's dementia with behavioral disturbance presented to the hospital with erratic behavior over few months.  Patient had been living in [place] for years until family were informed by neighbors that patient was wandering, appeared unbathed, and she moved to [facility].  Here, she lived with daughter for about 2 years, but in the last few months, she started to behave more impulsively, to at times seem aggressive and have verbal outbursts, severe insomnia, and so APS recommended the patient be brought to the ER. In the ER, the patient was seen by Psychiatry and started on new Seroquel.  She was held pending geri-psych placement, which was delayed for several months.  Daytime behaviors improved on high dose Seroquel but continues with sundowning behaviors. Added Celexa as mood stabilizer along with Tegretol on 2/14.  Currently awaiting for placement.  Assessment/Plan:  Principal Problem:   Dementia with behavioral disturbance (HCC) Active Problems:   Major depressive disorder, recurrent episode (HCC)   History of asthma, mild, intermittent   Early Onset Dementia with behavioral problem (HCC)   Protein-calorie malnutrition, moderate (HCC)   Transaminitis   Pressure injury of skin   Alzheimer disease (HCC)   Pressure ulcer of right hip, stage 2 (HCC)  Dementia with behavioral disturbance Patient has been requiring multiple medications to control behavioral disturbance.  On the Seroquel, Celexa.  Tegretol dose has been recently adjusted.  Will need to follow-up with LFTs.  On Ativan as well since the Vistaril was not effective.  Continue gabapentin.  PT OT was consulted and given her status of custodial care she was not a candidate for acute PT.  Pressure injury Right anterior hip/Stage 2 -Continue air  mattress, continue nutritional supplement  Urinary incontinence -Continue supportive care.     Moderate to severe protein calorie malnutrition Continue nutritional supplements.     Transaminitis Will need to follow LFTs.  DVT prophylaxis: None   Code Status: Full code  Family Communication: None  Status is: Inpatient  Remains inpatient appropriate because:Unsafe d/c plan and Inpatient level of care appropriate due to severity of illness   Dispo: The patient is from: Home              Anticipated d/c is to: SNF  Anticipated DC date more than 3 days              Patient currently is not medically stable to d/c.  [name]  Need to stabilize dementia with behavioral issues with psychotropic medication prior to discharge.   Difficult to place patient Yes  Consultants:  Psychiatry  Procedures:  None  Anti-infectives:  . None  Anti-infectives (From admission, onward)   None       Subjective: Today, patient was seen and examined at bedside. Sleepy. Denies pain nausea vomiting   Objective: Vitals:   04/09/20 2116 04/09/20 2245  BP: 113/76   Pulse: 96   Resp: 17   Temp: (!) 100.5 F (38.1 C) 99 F (37.2 C)  SpO2: 98%     Intake/Output Summary (Last 24 hours) at 04/10/2020 0848 Last data filed at 04/10/2020 0825 Gross per 24 hour  Intake 717 ml  Output 0 ml  Net 717 ml   Filed Weights   03/31/20 2141 04/06/20 1949 04/08/20 2134  Weight: 55.8 kg 52.7 kg 53.5 kg  Body mass index is 19.04 kg/m.   Physical Exam: GENERAL: Patient is sleepy at this time,Thinly built,, HENT:  Oral mucosa is moist NECK: is supple, no gross swelling noted. CHEST: Clear to auscultation. No crackles or wheezes.  Diminished breath sounds bilaterally. CVS: S1 and S2 heard, no murmur. Regular rate and rhythm.  ABDOMEN: Soft, non-tender, bowel sounds are present. EXTREMITIES: No edema. CNS: Moving all extremities. Sleepy at this time. SKIN: warm and dry without rashes.  Data  Review: I have personally reviewed the following laboratory data and studies,  CBC: No results for input(s): WBC, NEUTROABS, HGB, HCT, MCV, PLT in the last 168 hours. Basic Metabolic Panel: No results for input(s): NA, K, CL, CO2, GLUCOSE, BUN, CREATININE, CALCIUM, MG, PHOS in the last 168 hours. Liver Function Tests: No results for input(s): AST, ALT, ALKPHOS, BILITOT, PROT, ALBUMIN in the last 168 hours. No results for input(s): LIPASE, AMYLASE in the last 168 hours. No results for input(s): AMMONIA in the last 168 hours. Cardiac Enzymes: No results for input(s): CKTOTAL, CKMB, CKMBINDEX, TROPONINI in the last 168 hours. BNP (last 3 results) No results for input(s): BNP in the last 8760 hours.  ProBNP (last 3 results) No results for input(s): PROBNP in the last 8760 hours.  CBG: No results for input(s): GLUCAP in the last 168 hours. No results found for this or any previous visit (from the past 240 hour(s)).   Studies: No results found.    Joycelyn Das, MD  [facility] 04/10/2020  If 7PM-7AM, please contact night-coverage

## 2020-04-11 DIAGNOSIS — G309 Alzheimer's disease, unspecified: Secondary | ICD-10-CM | POA: Diagnosis not present

## 2020-04-11 DIAGNOSIS — F330 Major depressive disorder, recurrent, mild: Secondary | ICD-10-CM | POA: Diagnosis not present

## 2020-04-11 DIAGNOSIS — F0391 Unspecified dementia with behavioral disturbance: Secondary | ICD-10-CM | POA: Diagnosis not present

## 2020-04-11 DIAGNOSIS — L89212 Pressure ulcer of right hip, stage 2: Secondary | ICD-10-CM | POA: Diagnosis not present

## 2020-04-11 NOTE — Progress Notes (Signed)
PROGRESS NOTE  Sherry Hicks QIH:[id number] DOB: 01-16-1961 DOA: 01/27/2020 PCP: [name], DO   LOS: 7 days   Brief narrative:  60 years old female with past medical history of Alzheimer's dementia with behavioral disturbance presented to the hospital with erratic behavior over few months.  Patient had been living in [place] for years until family were informed by neighbors that patient was wandering, appeared unbathed, and she moved to [facility].  Here, she lived with daughter for about 2 years, but in the last few months, she started to behave more impulsively, to at times seem aggressive and have verbal outbursts, severe insomnia, and so APS recommended the patient be brought to the ER. In the ER, the patient was seen by Psychiatry and started on new Seroquel.  She was held pending geri-psych placement, which was delayed for several months.  Daytime behaviors improved on high dose Seroquel but continues with sundowning behaviors. Added Celexa as mood stabilizer along with Tegretol on 2/14.  Currently awaiting for placement.  Assessment/Plan:  Principal Problem:   Dementia with behavioral disturbance (HCC) Active Problems:   Major depressive disorder, recurrent episode (HCC)   History of asthma, mild, intermittent   Early Onset Dementia with behavioral problem (HCC)   Protein-calorie malnutrition, moderate (HCC)   Transaminitis   Pressure injury of skin   Alzheimer disease (HCC)   Pressure ulcer of right hip, stage 2 (HCC)  Dementia with behavioral disturbance .  On the Seroquel, Celexa.  Tegretol dose has been recently adjusted.  Will need to follow-up with LFTs.  On Ativan as well since the Vistaril was not effective.  Continue gabapentin.  PT OT was consulted and given her status of custodial care she was not a candidate for acute PT. currently calm.  Pressure injury Right anterior hip/Stage 2 -Continue air mattress, continue nutritional supplement  Urinary  incontinence -Continue supportive care.     Moderate to severe protein calorie malnutrition Continue nutritional supplements.     Elevated LFTs Will need to follow LFTs.  DVT prophylaxis: None   Code Status: Full code  Family Communication: None  Status is: Inpatient  Remains inpatient appropriate because:Unsafe d/c plan and Inpatient level of care appropriate due to severity of illness   Dispo: The patient is from: Home              Anticipated d/c is to: SNF  Anticipated DC date > 3 days              Patient currently is not medically stable to d/c.  Need to stabilize dementia with behavioral issues with psychotropic medication prior to discharge.   Difficult to place patient Yes  Consultants:  Psychiatry  Procedures:  None  Anti-infectives:  . None  Anti-infectives (From admission, onward)   None       Subjective: Today, patient was seen and examined at bedside.  Patient was calm and somnolent at the time of my evaluation.   Objective: Vitals:   04/10/20 2219 04/11/20 0449  BP: 99/84 (!) 129/91  Pulse: 92 71  Resp: 16   Temp: 97.9 F (36.6 C) (!) 97.5 F (36.4 C)  SpO2: 98% 99%    Intake/Output Summary (Last 24 hours) at 04/11/2020 1053 Last data filed at 04/10/2020 1715 Gross per 24 hour  Intake 717 ml  Output --  Net 717 ml   Filed Weights   04/06/20 1949 04/08/20 2134 04/11/20 0300  Weight: 52.7 kg 53.5 kg 55.7 kg   Body mass  index is 19.82 kg/m.   Physical Exam: GENERAL: Patient is calm and somnolent HENT:  Oral mucosa is moist NECK: is supple, no gross swelling noted. CHEST: Clear to auscultation. No crackles or wheezes.  Diminished breath sounds bilaterally. CVS: S1 and S2 heard, no murmur. Regular rate and rhythm.  ABDOMEN: Soft, non-tender, bowel sounds are present. EXTREMITIES: No edema. CNS: Moving all extremities.  Somnolent SKIN: warm and dry without rashes.  Data Review: I have personally reviewed the following  laboratory data and studies,  CBC: No results for input(s): WBC, NEUTROABS, HGB, HCT, MCV, PLT in the last 168 hours. Basic Metabolic Panel: No results for input(s): NA, K, CL, CO2, GLUCOSE, BUN, CREATININE, CALCIUM, MG, PHOS in the last 168 hours. Liver Function Tests: No results for input(s): AST, ALT, ALKPHOS, BILITOT, PROT, ALBUMIN in the last 168 hours. No results for input(s): LIPASE, AMYLASE in the last 168 hours. No results for input(s): AMMONIA in the last 168 hours. Cardiac Enzymes: No results for input(s): CKTOTAL, CKMB, CKMBINDEX, TROPONINI in the last 168 hours. BNP (last 3 results) No results for input(s): BNP in the last 8760 hours.  ProBNP (last 3 results) No results for input(s): PROBNP in the last 8760 hours.  CBG: No results for input(s): GLUCAP in the last 168 hours. No results found for this or any previous visit (from the past 240 hour(s)).   Studies: No results found.    Joycelyn Das, MD  [facility] 04/11/2020  If 7PM-7AM, please contact night-coverage

## 2020-04-12 DIAGNOSIS — L89212 Pressure ulcer of right hip, stage 2: Secondary | ICD-10-CM | POA: Diagnosis not present

## 2020-04-12 DIAGNOSIS — F0391 Unspecified dementia with behavioral disturbance: Secondary | ICD-10-CM | POA: Diagnosis not present

## 2020-04-12 DIAGNOSIS — E440 Moderate protein-calorie malnutrition: Secondary | ICD-10-CM | POA: Diagnosis not present

## 2020-04-12 LAB — CBC
HCT: 32.5 % — ABNORMAL LOW (ref 36.0–46.0)
Hemoglobin: 10.8 g/dL — ABNORMAL LOW (ref 12.0–15.0)
MCH: 32.4 pg (ref 26.0–34.0)
MCHC: 33.2 g/dL (ref 30.0–36.0)
MCV: 97.6 fL (ref 80.0–100.0)
Platelets: 267 10*3/uL (ref 150–400)
RBC: 3.33 MIL/uL — ABNORMAL LOW (ref 3.87–5.11)
RDW: 12.4 % (ref 11.5–15.5)
WBC: 4.3 10*3/uL (ref 4.0–10.5)
nRBC: 0 % (ref 0.0–0.2)

## 2020-04-12 LAB — BASIC METABOLIC PANEL
Anion gap: 8 (ref 5–15)
BUN: 18 mg/dL (ref 6–20)
CO2: 31 mmol/L (ref 22–32)
Calcium: 9 mg/dL (ref 8.9–10.3)
Chloride: 100 mmol/L (ref 98–111)
Creatinine, Ser: 0.88 mg/dL (ref 0.44–1.00)
GFR, Estimated: >60 mL/min (ref >60)
Glucose, Bld: 99 mg/dL (ref 70–99)
Potassium: 3.9 mmol/L (ref 3.5–5.1)
Sodium: 139 mmol/L (ref 135–145)

## 2020-04-12 LAB — MAGNESIUM: Magnesium: 2 mg/dL (ref 1.7–2.4)

## 2020-04-12 LAB — AMMONIA: Ammonia: 17 umol/L (ref 9–35)

## 2020-04-12 NOTE — Progress Notes (Signed)
[facility] PROGRESS NOTE  SEVILLE BRICK DZH:[id number] DOB: 10-Jun-1960 DOA: 01/27/2020 PCP: [name], DO       03/29/2020                        04/12/2020   Status: The patient remains OBS appropriate and will d/c before 2 midnights.  Patient does not meet criteria for inpatient but is difficult to place secondary to need for skilled nursing facility secondary to severe dementia.  Dispo:               The patient is from: Home              Anticipated d/c is to: SNF              Anticipated d/c date is: > 3 days              Patient currently is not medically stable to d/c.  Barriers to discharge: Need to completely stabilize dementia behavioral issues with psychotropic medications before stable to discharge.  Still awaiting SNF bed offer   Difficult to place patient Yes  Level of care: Med-Surg  Code Status: Full Family Communication: 2/23 able to reach daughter by telephone.  Had very pleasant conversation regarding her mother's care.  She is concerned that she is experiencing daytime sleepiness in context of initiation of Ativan.  Discussed with her that if patient remains sleepy in the daytime hours by 2/24 we would likely stop daytime dose and only give at bedtime.  She verbalized understanding.  States she plans on coming back today to visit her mother. DVT prophylaxis: Ambulatory and low risk for DVT Vaccination status: ? If has received Covid vaccines  Foley catheter: No  HPI: 60 y.o.Fwith Alzheimer's dementia with behavioral disturbance who presented with progressively erratic behavior over months.  See longer summary from H&P on 2/9, but in brief, patient had been living in [place] for years until family were informed by neighbors that patient was wandering, appeared unbathed, and she moved to [facility]. Here, she lived with daughter for about 2 years, but in the last few months, she started to behave more impulsively, to at times seem aggressive and have  verbal outbursts, severe insomnia, and so APS recommended the patient be brought to the ER.  In the ER, the patient was seen by Psychiatry and started on new Seroquel. She was held pending geri-psych placement, which was delayed for several months.  Daytime behaviors improved on high dose Seroquel but continues with sundowning behaviors. Added Celexa as mood stabilizer along with Tegretol on 2/14   Subjective: Sleeping soundly.  Very calm.  As picture demonstrates patient holding baby doll given to her over the weekend.  Staff reports that patient continues to have verbal outbursts of agitation but is not having any physical interaction such as abusive physical behavior or aggressive behavior.   Objective: Vitals:   04/11/20 1700 04/11/20 2126  BP: 112/67 119/83  Pulse: 72 91  Resp: 18 14  Temp: 98.2 F (36.8 C) 98.6 F (37 C)  SpO2: 98% 97%    Intake/Output Summary (Last 24 hours) at 04/12/2020 0759 Last data filed at 04/12/2020 0700 Gross per 24 hour  Intake 1374 ml  Output --  Net 1374 ml   Filed Weights   04/06/20 1949 04/08/20 2134 04/11/20 0300  Weight: 52.7 kg 53.5 kg 55.7 kg    Exam:  Constitutional: Sleeping, calm, does not appear to be in  any distress Respiratory: Lungs are clear, stable on room air Cardiovascular: Heart sounds S1-S2, pulses regular, extremities without edema Abdomen: Prefers sweets.  Soft with normoactive bowel sounds.  Continues with variable oral intake.  LBM 2/26 Neurologic: CN 2-12 grossly intact.  All extremities x4 without any focal neurological deficits.  Gait and ambulation not assessed. Psychiatric: Sleeping.  Typically at baseline is oriented to self only.  Not oriented to time, place or situation.     Assessment/Plan: Acute problems: Dementia with behavioral disturbance Behaviors seem improved without behaviors that would require IM Geodon, Haldol or Seroquel prn Continue high-dose Seroquel-QTC checked on 2/14 less than 500 and  no additional dose changes have been made Continue Seroquel PO prn for breakthrough behaviors Celexa (mood stabilizer) increased to 20 mg/day on 2/21 2/21 continued issues with recurrent agitation and sometimes aggressive behavior therefore will increase daytime Tegretol to 400 mg 8 am and 2 pm and 400 mg 6 pm.  Need to check LFTs in 1 week Vistaril without any significant impact on behavior so was discontinued in favor of Ativan Trialed stopping daytime Ativan due to concerns over possible oversedation but patient became extremely agitated within 24 hours so daytime Ativan resumed Continue gabapentin 2/14 reevaluated by PT and given her status of custodial care not appropriate for acute PT.  Was also evaluated by OT and patient did not have comprehension to participate with OT sessions and was actually actively hallucinating talking on the phone with her cousin therefore no acute OT services indicated as well.  Pressure injury Right anterior hip/Stage 2 -Continue air mattress -Maximize nutrition (see below) and encourage mobility (OOB to chair/ambulate TID) Pressure Injury 04/01/20 Hip Anterior;Right Stage 2 -  Partial thickness loss of dermis presenting as a shallow open injury with a red, pink wound bed without slough. (Active)  Date First Assessed/Time First Assessed: 04/01/20 2230   Location: Hip  Location Orientation: Anterior;Right  Staging: Stage 2 -  Partial thickness loss of dermis presenting as a shallow open injury with a red, pink wound bed without slough.  Present ...    Assessments 04/01/2020 10:05 PM 04/12/2020  7:45 AM  Dressing Type Foam - Lift dressing to assess site every shift Foam - Lift dressing to assess site every shift  Dressing Clean;Dry;Intact --  Site / Wound Assessment Pink --  Drainage Amount None --     No Linked orders to display   Urinary incontinence -Pericare and other personal care difficult to deliver to patient given her underlying dementia and fear of  strangers providing such care -We will try to utilize mesh panties with peripads to minimize degree of personal-care to be given i.e. need to change clothes or change bed linens from significant incontinence of urine and or stool  Other problems: Moderate to severe protein calorie malnutrition -Continuefeeding supplements -Give ice cream or Magic cup twice daily -Albumin 2.9 on 2/14 Estimated body mass index is 19.82 kg/m as calculated from the following:   Height as of this encounter: 5\' 6"  (1.676 m).   Weight as of this encounter: 55.7 kg.  Transaminitis -Repeat LFTs ordered for 2/14 within normal limits   Data Reviewed: Basic Metabolic Panel: Recent Labs  Lab 04/12/20 0340  NA 139  K 3.9  CL 100  CO2 31  GLUCOSE 99  BUN 18  CREATININE 0.88  CALCIUM 9.0  MG 2.0   Liver Function Tests: No results for input(s): AST, ALT, ALKPHOS, BILITOT, PROT, ALBUMIN in the last 168 hours. No results  for input(s): LIPASE, AMYLASE in the last 168 hours. Recent Labs  Lab 04/12/20 0340  AMMONIA 17   CBC: Recent Labs  Lab 04/12/20 0340  WBC 4.3  HGB 10.8*  HCT 32.5*  MCV 97.6  PLT 267   Cardiac Enzymes: No results for input(s): CKTOTAL, CKMB, CKMBINDEX, TROPONINI in the last 168 hours. BNP (last 3 results) No results for input(s): BNP in the last 8760 hours.  ProBNP (last 3 results) No results for input(s): PROBNP in the last 8760 hours.  CBG: No results for input(s): GLUCAP in the last 168 hours.  No results found for this or any previous visit (from the past 240 hour(s)).   Studies: No results found.  Scheduled Meds: . carbamazepine  400 mg Oral BID   And  . carbamazepine  400 mg Oral QPM  . citalopram  20 mg Oral Daily  . feeding supplement  237 mL Oral BID BM  . gabapentin  100 mg Oral BID  . LORazepam  0.5 mg Oral BID  . QUEtiapine  100 mg Oral BID  . QUEtiapine  150 mg Oral QHS   Continuous Infusions:  Principal Problem:   Dementia with behavioral  disturbance (HCC) Active Problems:   Major depressive disorder, recurrent episode (HCC)   History of asthma, mild, intermittent   Early Onset Dementia with behavioral problem (HCC)   Protein-calorie malnutrition, moderate (HCC)   Transaminitis   Pressure injury of skin   Alzheimer disease (HCC)   Pressure ulcer of right hip, stage 2 (HCC)   Consultants:  Psychiatry  Procedures:  None  Antibiotics: Anti-infectives (From admission, onward)   None       Time spent: 20 minutes    Junious Silk ANP  [facility] 7 am - 330 pm/M-F for direct patient care and secure chat Please refer to Amion for contact info 75  days

## 2020-04-12 NOTE — TOC Progression Note (Signed)
Transition of Care [facility]) - Progression Note    Patient Details  Name: CYNDIE WOODBECK MRN: [id number] Date of Birth: 25-Jun-1960  Transition of Care [facility]) CM/SW Contact  Janae Bridgeman, RN Phone Number: 04/12/2020, 8:36 AM  Clinical Narrative:    Case management left a message with Phineas Semen, CM at [facility] and asked if they are able to offer a Long Term Care bed at their facility.  CM will continue to follow the patient for admission at [facility] facility.  04/13/2020 1454 - CM spoke with Patton Salles, CM at [facility] and she will look at the patient's clinicals and determine if the facility is able to offer a SNF bed to the patient. Please see Edwin Dada, MSW as well as [facility] SNF and the facility is evaluating the patient as well.  Will continue to follow the patient for SNF admission.  Expected Discharge Plan: Skilled Nursing Facility Barriers to Discharge: Continued Medical Work up,Barriers Unresolved (comment),SNF Pending bed offer  Expected Discharge Plan and Services Expected Discharge Plan: Skilled Nursing Facility In-house Referral: Clinical Social Work Discharge Planning Services: CM Consult Post Acute Care Choice: Skilled Nursing Facility Living arrangements for the past 2 months: Apartment                                       Social Determinants of Health (SDOH) Interventions    Readmission Risk Interventions Readmission Risk Prevention Plan 04/06/2020  Post Dischage Appt Complete  Medication Screening Complete  Transportation Screening Complete  Some recent data might be hidden

## 2020-04-12 NOTE — Progress Notes (Signed)
CSW spoke with Vincenza Hews at [facility] who states [facility] will not be able to offer this patient a bed.  Edwin Dada, MSW, LCSW Transitions of Care  Clinical Social Worker II [phone]

## 2020-04-13 DIAGNOSIS — F0281 Dementia in other diseases classified elsewhere with behavioral disturbance: Secondary | ICD-10-CM | POA: Diagnosis not present

## 2020-04-13 DIAGNOSIS — E440 Moderate protein-calorie malnutrition: Secondary | ICD-10-CM | POA: Diagnosis not present

## 2020-04-13 DIAGNOSIS — L89212 Pressure ulcer of right hip, stage 2: Secondary | ICD-10-CM | POA: Diagnosis not present

## 2020-04-13 MED ORDER — IOHEXOL 9 MG/ML PO SOLN
ORAL | Status: AC
  Filled 2020-04-13: qty 1000

## 2020-04-13 NOTE — Progress Notes (Signed)
[facility] PROGRESS NOTE  Sherry Hicks HYI:[id number] DOB: [birth year] DOA: 01/27/2020 PCP: [name], DO       03/29/2020                        04/12/2020   Status: The patient remains OBS appropriate and will d/c before 2 midnights.  Patient does not meet criteria for inpatient but is difficult to place secondary to need for skilled nursing facility secondary to severe dementia.  Dispo:               The patient is from: Home              Anticipated d/c is to: SNF              Anticipated d/c date is: > 3 days              Barriers to discharge: Still awaiting SNF bed offer-behavioral disturbances secondary to severe dementia are better controlled with appropriate medications and diversions (baby doll)   Difficult to place patient Yes  Level of care: Med-Surg  Code Status: Full Family Communication:  DVT prophylaxis: Ambulatory and low risk for DVT Vaccination status: Discussed with daughter Sherry Hicks.  She states on the whole she does not believe in vaccinations but understands that if the facility requires her mother to be vaccinated prior to injury she will allow her mother to receive a vaccination.  I did let her know that some facilities require full vaccination and that with the mRNA vaccines to be fully vaccinated takes up to 6 weeks with a 2-week waiting period after the second vaccine given.  Stated that she preferred the Pfizer vaccine and did not want her mother to have the Janssen vaccine  Foley catheter: No  HPI: 60 y.o.Fwith Alzheimer's dementia with behavioral disturbance who presented with progressively erratic behavior over months.  See longer summary from H&P on 2/9, but in brief, patient had been living in [place] for years until family were informed by neighbors that patient was wandering, appeared unbathed, and she moved to [facility]. Here, she lived with daughter for about 2 years, but in the last few months, she started to behave more  impulsively, to at times seem aggressive and have verbal outbursts, severe insomnia, and so APS recommended the patient be brought to the ER.  In the ER, the patient was seen by Psychiatry and started on new Seroquel. She was held pending geri-psych placement, which was delayed for several months.  Daytime behaviors improved on high dose Seroquel but continues with sundowning behaviors. Added Celexa as mood stabilizer along with Tegretol on 2/14   Subjective: Patient sleeping soundly and briefly awakened when baby doll placed in her arms.   Objective: Vitals:   04/12/20 1655 04/13/20 0502  BP: 122/73 115/78  Pulse: 92 67  Resp: 14   Temp: 99.3 F (37.4 C) 98 F (36.7 C)  SpO2: 96% 100%    Intake/Output Summary (Last 24 hours) at 04/13/2020 0758 Last data filed at 04/12/2020 2201 Gross per 24 hour  Intake 954 ml  Output --  Net 954 ml   Filed Weights   04/06/20 1949 04/08/20 2134 04/11/20 0300  Weight: 52.7 kg 53.5 kg 55.7 kg    Exam:  Constitutional:, Calm and does not appear to be in distress Respiratory:'s are clear to auscultation, stable on room air Cardiovascular: Heart sounds are normal, pulses regular, no peripheral edema and extremities are warm Abdomen:  Prefers sweets.  Continues with marginal oral intake.   LBM 2/26 Neurologic: CN 2-12 grossly intact.  All extremities x4 without any focal neurological deficits.  Gait and ambulation not assessed. Psychiatric: Sleeping so not assessed.  At baseline oriented to self only.     Assessment/Plan: Acute problems: Dementia with behavioral disturbance Behaviors seem improved without behaviors that would require IM Geodon, Haldol or Seroquel prn Continue high-dose Seroquel-QTC checked on 2/14 less than 500 and no additional dose changes have been made Continue Seroquel PO prn for breakthrough behaviors Celexa (mood stabilizer) increased to 20 mg/day on 2/21 2/21 continued issues with recurrent agitation and  sometimes aggressive behavior therefore will increase daytime Tegretol to 400 mg 8 am and 2 pm and 400 mg 6 pm.  Need to check LFTs in 1 week Vistaril without any significant impact on behavior so was discontinued in favor of Ativan Trialed stopping daytime Ativan due to concerns over possible oversedation but patient became extremely agitated within 24 hours so daytime Ativan resumed Continue gabapentin 2/14 reevaluated by PT and given her status of custodial care not appropriate for acute PT.  Was also evaluated by OT and patient did not have comprehension to participate with OT sessions and was actually actively hallucinating talking on the phone with her cousin therefore no acute OT services indicated as well.  Pressure injury Right anterior hip/Stage 2 -Continue air mattress -Maximize nutrition (see below) and encourage mobility (OOB to chair/ambulate TID) Pressure Injury 04/01/20 Hip Anterior;Right Stage 2 -  Partial thickness loss of dermis presenting as a shallow open injury with a red, pink wound bed without slough. (Active)  Date First Assessed/Time First Assessed: 04/01/20 2230   Location: Hip  Location Orientation: Anterior;Right  Staging: Stage 2 -  Partial thickness loss of dermis presenting as a shallow open injury with a red, pink wound bed without slough.  Present ...    Assessments 04/01/2020 10:05 PM 04/13/2020  7:30 AM  Dressing Type Foam - Lift dressing to assess site every shift Foam - Lift dressing to assess site every shift  Dressing Clean;Dry;Intact Clean;Dry;Intact  Site / Wound Assessment Pink Clean;Dry  Drainage Amount None --     No Linked orders to display   Urinary incontinence -Pericare and other personal care difficult to deliver to patient given her underlying dementia and fear of strangers providing such care -We will try to utilize mesh panties with peripads to minimize degree of personal-care to be given i.e. need to change clothes or change bed linens from  significant incontinence of urine and or stool  Other problems: Moderate to severe protein calorie malnutrition -Continuefeeding supplements -Give ice cream or Magic cup twice daily -Albumin 2.9 on 2/14 Estimated body mass index is 19.82 kg/m as calculated from the following:   Height as of this encounter: 5\' 6"  (1.676 m).   Weight as of this encounter: 55.7 kg.  Transaminitis -Repeat LFTs ordered for 2/14 within normal limits   Data Reviewed: Basic Metabolic Panel: Recent Labs  Lab 04/12/20 0340  NA 139  K 3.9  CL 100  CO2 31  GLUCOSE 99  BUN 18  CREATININE 0.88  CALCIUM 9.0  MG 2.0   Liver Function Tests: No results for input(s): AST, ALT, ALKPHOS, BILITOT, PROT, ALBUMIN in the last 168 hours. No results for input(s): LIPASE, AMYLASE in the last 168 hours. Recent Labs  Lab 04/12/20 0340  AMMONIA 17   CBC: Recent Labs  Lab 04/12/20 0340  WBC 4.3  HGB  10.8*  HCT 32.5*  MCV 97.6  PLT 267   Cardiac Enzymes: No results for input(s): CKTOTAL, CKMB, CKMBINDEX, TROPONINI in the last 168 hours. BNP (last 3 results) No results for input(s): BNP in the last 8760 hours.  ProBNP (last 3 results) No results for input(s): PROBNP in the last 8760 hours.  CBG: No results for input(s): GLUCAP in the last 168 hours.  No results found for this or any previous visit (from the past 240 hour(s)).   Studies: No results found.  Scheduled Meds: . carbamazepine  400 mg Oral BID   And  . carbamazepine  400 mg Oral QPM  . citalopram  20 mg Oral Daily  . feeding supplement  237 mL Oral BID BM  . gabapentin  100 mg Oral BID  . LORazepam  0.5 mg Oral BID  . QUEtiapine  100 mg Oral BID  . QUEtiapine  150 mg Oral QHS   Continuous Infusions:  Principal Problem:   Dementia with behavioral disturbance (HCC) Active Problems:   Major depressive disorder, recurrent episode (HCC)   History of asthma, mild, intermittent   Early Onset Dementia with behavioral problem  (HCC)   Protein-calorie malnutrition, moderate (HCC)   Transaminitis   Pressure injury of skin   Alzheimer disease (HCC)   Pressure ulcer of right hip, stage 2 (HCC)   Consultants:  Psychiatry  Procedures:  None  Antibiotics: Anti-infectives (From admission, onward)   None       Time spent: 20 minutes    Junious Silk ANP  [facility] 7 am - 330 pm/M-F for direct patient care and secure chat Please refer to Amion for contact info 76  days

## 2020-04-14 DIAGNOSIS — F0391 Unspecified dementia with behavioral disturbance: Secondary | ICD-10-CM | POA: Diagnosis not present

## 2020-04-14 DIAGNOSIS — L89212 Pressure ulcer of right hip, stage 2: Secondary | ICD-10-CM | POA: Diagnosis not present

## 2020-04-14 DIAGNOSIS — E440 Moderate protein-calorie malnutrition: Secondary | ICD-10-CM | POA: Diagnosis not present

## 2020-04-14 LAB — COMPREHENSIVE METABOLIC PANEL
ALT: 26 U/L (ref 0–44)
AST: 23 U/L (ref 15–41)
Albumin: 3 g/dL — ABNORMAL LOW (ref 3.5–5.0)
Alkaline Phosphatase: 67 U/L (ref 38–126)
Anion gap: 7 (ref 5–15)
BUN: 19 mg/dL (ref 6–20)
CO2: 31 mmol/L (ref 22–32)
Calcium: 9.1 mg/dL (ref 8.9–10.3)
Chloride: 102 mmol/L (ref 98–111)
Creatinine, Ser: 0.72 mg/dL (ref 0.44–1.00)
GFR, Estimated: >60 mL/min (ref >60)
Glucose, Bld: 85 mg/dL (ref 70–99)
Potassium: 4.2 mmol/L (ref 3.5–5.1)
Sodium: 140 mmol/L (ref 135–145)
Total Bilirubin: 0.5 mg/dL (ref 0.3–1.2)
Total Protein: 5.6 g/dL — ABNORMAL LOW (ref 6.5–8.1)

## 2020-04-14 NOTE — Care Management Important Message (Signed)
Important Message  Patient Details  Name: Sherry Hicks MRN: [id number] Date of Birth: Jan 14, 1961   Medicare Important Message Given:  Yes     Riad Wagley Stefan Church 04/14/2020, 3:29 PM

## 2020-04-14 NOTE — Progress Notes (Signed)
[facility] PROGRESS NOTE  Sherry Hicks XBL:[id number] DOB: [birth year] DOA: 01/27/2020 PCP: [name], DO       03/29/2020                        04/12/2020   Status: The patient remains OBS appropriate and will d/c before 2 midnights.  Patient does not meet criteria for inpatient but is difficult to place secondary to need for skilled nursing facility secondary to severe dementia.  Dispo:               The patient is from: Home              Anticipated d/c is to: SNF              Anticipated d/c date is: > 3 days              Barriers to discharge: Still awaiting SNF bed offer-behavioral disturbances secondary to severe dementia are better controlled with appropriate medications and diversions (baby doll)   Difficult to place patient Yes  Level of care: Med-Surg  Code Status: Full Family Communication:  DVT prophylaxis: Ambulatory and low risk for DVT Vaccination status: Discussed with daughter [name].  She states on the whole she does not believe in vaccinations but understands that if the facility requires her mother to be vaccinated prior to injury she will allow her mother to receive a vaccination.  I did let her know that some facilities require full vaccination and that with the mRNA vaccines to be fully vaccinated takes up to 6 weeks with a 2-week waiting period after the second vaccine given.  Stated that she preferred the Pfizer vaccine and did not want her mother to have the Janssen vaccine  Foley catheter: No  HPI: 60 y.o.Fwith Alzheimer's dementia with behavioral disturbance who presented with progressively erratic behavior over months.  See longer summary from H&P on 2/9, but in brief, patient had been living in [place] for years until family were informed by neighbors that patient was wandering, appeared unbathed, and she moved to [facility]. Here, she lived with daughter for about 2 years, but in the last few months, she started to behave more  impulsively, to at times seem aggressive and have verbal outbursts, severe insomnia, and so APS recommended the patient be brought to the ER.  In the ER, the patient was seen by Psychiatry and started on new Seroquel. She was held pending geri-psych placement, which was delayed for several months.  Daytime behaviors improved on high dose Seroquel but continues with sundowning behaviors. Added Celexa as mood stabilizer along with Tegretol on 2/14   Subjective: Sleeping.  Awakened.  Seems slightly agitated but calmed down when I mentioned her baby doll.  She states "where is she" and I placed baby down within her reach so she can feel baby doll and she immediately calmed.   Objective: Vitals:   04/13/20 1702 04/14/20 0459  BP: 116/77 94/62  Pulse: 98 70  Resp: 18 16  Temp: 98.6 F (37 C) 97.7 F (36.5 C)  SpO2: 99% 97%    Intake/Output Summary (Last 24 hours) at 04/14/2020 0800 Last data filed at 04/14/2020 3007 Gross per 24 hour  Intake 600 ml  Output 0 ml  Net 600 ml   Filed Weights   04/06/20 1949 04/08/20 2134 04/11/20 0300  Weight: 52.7 kg 53.5 kg 55.7 kg    Exam:  Constitutional: Awakens, slightly agitated at first  but calmed after being given her baby doll Respiratory: Lungs are clear, stable on room air Cardiovascular: Sounds are normal, pulses regular, normotensive, skin warm and dry Abdomen: Abdomen soft nontender bowel sounds present.  Marginal oral intake.  Does better when fed and prefers sweets.  LBM 2/28 Neurologic: CN 2-12 grossly intact.  All extremities x4 without any focal neurological deficits.  Gait and ambulation not assessed. Psychiatric: Awake and.  At baseline oriented to name only.  Currently pleasant affect.     Assessment/Plan: Acute problems: Dementia with behavioral disturbance Behaviors seem improved without behaviors that would require IM Geodon, Haldol or Seroquel prn Continue high-dose Seroquel-QTC checked on 2/14 less than 500 and no  additional dose changes have been made Continue Seroquel PO prn for breakthrough behaviors Celexa (mood stabilizer) increased to 20 mg/day on 2/21 2/21 continued issues with recurrent agitation and sometimes aggressive behavior therefore will increase daytime Tegretol to 400 mg 8 am and 2 pm and 400 mg 6 pm.  3/2 LFTs repeated and are within normal limits Vistaril without any significant impact on behavior so was discontinued in favor of Ativan Trialed stopping daytime Ativan due to concerns over possible oversedation but patient became extremely agitated within 24 hours so daytime Ativan resumed Continue gabapentin 2/14 reevaluated by PT and given her status of custodial care not appropriate for acute PT.  Was also evaluated by OT and patient did not have comprehension to participate with OT sessions and was actually actively hallucinating talking on the phone with her cousin therefore no acute OT services indicated as well.  Pressure injury Right anterior hip/Stage 2 -Continue air mattress -Maximize nutrition (see below) and encourage mobility (OOB to chair/ambulate TID) Pressure Injury 04/01/20 Hip Anterior;Right Stage 2 -  Partial thickness loss of dermis presenting as a shallow open injury with a red, pink wound bed without slough. (Active)  Date First Assessed/Time First Assessed: 04/01/20 2230   Location: Hip  Location Orientation: Anterior;Right  Staging: Stage 2 -  Partial thickness loss of dermis presenting as a shallow open injury with a red, pink wound bed without slough.  Present ...    Assessments 04/01/2020 10:05 PM 04/13/2020  7:43 PM  Dressing Type Foam - Lift dressing to assess site every shift Foam - Lift dressing to assess site every shift  Dressing Clean;Dry;Intact Clean;Dry;Intact  Site / Wound Assessment Pink --  Drainage Amount None --     No Linked orders to display   Urinary incontinence -Pericare and other personal care difficult to deliver to patient given her  underlying dementia and fear of strangers providing such care -We will try to utilize mesh panties with peripads to minimize degree of personal-care to be given i.e. need to change clothes or change bed linens from significant incontinence of urine and or stool  Other problems: Moderate to severe protein calorie malnutrition -Continuefeeding supplements -Give ice cream or Magic cup twice daily -Albumin 2.9 on 2/14 Estimated body mass index is 19.82 kg/m as calculated from the following:   Height as of this encounter: 5\' 6"  (1.676 m).   Weight as of this encounter: 55.7 kg.  Transaminitis -Repeat LFTs ordered for 2/14 within normal limits and repeated again on 3/2 and remain within normal limits   Data Reviewed: Basic Metabolic Panel: Recent Labs  Lab 04/12/20 0340  NA 139  K 3.9  CL 100  CO2 31  GLUCOSE 99  BUN 18  CREATININE 0.88  CALCIUM 9.0  MG 2.0   Liver Function  Tests: No results for input(s): AST, ALT, ALKPHOS, BILITOT, PROT, ALBUMIN in the last 168 hours. No results for input(s): LIPASE, AMYLASE in the last 168 hours. Recent Labs  Lab 04/12/20 0340  AMMONIA 17   CBC: Recent Labs  Lab 04/12/20 0340  WBC 4.3  HGB 10.8*  HCT 32.5*  MCV 97.6  PLT 267   Cardiac Enzymes: No results for input(s): CKTOTAL, CKMB, CKMBINDEX, TROPONINI in the last 168 hours. BNP (last 3 results) No results for input(s): BNP in the last 8760 hours.  ProBNP (last 3 results) No results for input(s): PROBNP in the last 8760 hours.  CBG: No results for input(s): GLUCAP in the last 168 hours.  No results found for this or any previous visit (from the past 240 hour(s)).   Studies: No results found.  Scheduled Meds: . carbamazepine  400 mg Oral BID   And  . carbamazepine  400 mg Oral QPM  . citalopram  20 mg Oral Daily  . feeding supplement  237 mL Oral BID BM  . gabapentin  100 mg Oral BID  . LORazepam  0.5 mg Oral BID  . QUEtiapine  100 mg Oral BID  . QUEtiapine  150  mg Oral QHS   Continuous Infusions:  Principal Problem:   Dementia with behavioral disturbance (HCC) Active Problems:   Major depressive disorder, recurrent episode (HCC)   History of asthma, mild, intermittent   Early Onset Dementia with behavioral problem (HCC)   Protein-calorie malnutrition, moderate (HCC)   Transaminitis   Pressure injury of skin   Alzheimer disease (HCC)   Pressure ulcer of right hip, stage 2 (HCC)   Consultants:  Psychiatry  Procedures:  None  Antibiotics: Anti-infectives (From admission, onward)   None       Time spent: 20 minutes    Junious Silk ANP  [facility] 7 am - 330 pm/M-F for direct patient care and secure chat Please refer to Amion for contact info 76  days

## 2020-04-15 DIAGNOSIS — E440 Moderate protein-calorie malnutrition: Secondary | ICD-10-CM | POA: Diagnosis not present

## 2020-04-15 DIAGNOSIS — G300 Alzheimer's disease with early onset: Secondary | ICD-10-CM | POA: Diagnosis not present

## 2020-04-15 DIAGNOSIS — F339 Major depressive disorder, recurrent, unspecified: Secondary | ICD-10-CM | POA: Diagnosis not present

## 2020-04-15 DIAGNOSIS — L89212 Pressure ulcer of right hip, stage 2: Secondary | ICD-10-CM | POA: Diagnosis not present

## 2020-04-15 DIAGNOSIS — F0281 Dementia in other diseases classified elsewhere with behavioral disturbance: Secondary | ICD-10-CM | POA: Diagnosis not present

## 2020-04-15 DIAGNOSIS — F0391 Unspecified dementia with behavioral disturbance: Secondary | ICD-10-CM | POA: Diagnosis not present

## 2020-04-15 DIAGNOSIS — F05 Delirium due to known physiological condition: Secondary | ICD-10-CM | POA: Diagnosis not present

## 2020-04-15 NOTE — TOC Progression Note (Addendum)
Transition of Care [facility]) - Progression Note    Patient Details  Name: Sherry Hicks MRN: [id number] Date of Birth: Feb 09, 1961  Transition of Care [facility] CM/SW Contact  Curlene Labrum, RN Phone Number: 04/15/2020, 11:28 AM  Clinical Narrative:    Case management met with the patient at the bedside regarding transitions of care to SNF facility.  The patient was quiet but was able to respond to simple questions this morning when asked  - music playing in the room per nursing staff.  I spoke with Vonna Kotyk, CM at [facility] facility in [facility] and the facility was willing to view the patient's clinicals since no promising bed offers have been received in the [place] area at this time through fax and/or the hub.  The patient's family is willing to let her receive a COVID vaccine once a bed offer has been received.  The patient's daughter is only in agreement for patient to receive a vaccine other than J&J.  CM will continue to follow the patient for SNF placement.  04/15/2020 1341 - Spoke with Monia Pouch, CM at [facility] and she is willing to evaluate the patient's clinicals and visit the patient for evaluation for potential admission bed not that patient's behaviors have improved since admission to the hospital.  Expected Discharge Plan: Skilled Nursing Facility Barriers to Discharge: Continued Medical Work up,Barriers Unresolved (comment),SNF Pending bed offer  Expected Discharge Plan and Services Expected Discharge Plan: [place] In-house Referral: Clinical Social Work Discharge Planning Services: CM Consult Post Acute Care Choice: [place] Living arrangements for the past 2 months: Apartment                                       Social Determinants of Health (SDOH) Interventions    Readmission Risk Interventions Readmission Risk Prevention Plan 04/06/2020  Post Dischage Appt Complete  Medication  Screening Complete  Transportation Screening Complete  Some recent data might be hidden

## 2020-04-15 NOTE — Progress Notes (Addendum)
[facility] PROGRESS NOTE  JEWEL MCAFEE IDH:[id number] DOB: 10-Jan-1961 DOA: 01/27/2020 PCP: [name], DO       03/29/2020                        04/12/2020   Status: The patient remains OBS appropriate and will d/c before 2 midnights.  Patient does not meet criteria for inpatient but is difficult to place secondary to need for skilled nursing facility secondary to severe dementia.  Dispo:               The patient is from: Home              Anticipated d/c is to: SNF              Anticipated d/c date is: > 3 days              Barriers to discharge: Still awaiting SNF bed offer-behavioral disturbances secondary to severe dementia are better controlled with appropriate medications and diversions (baby doll). [facility] reviewing again given improvement in behaviors   Difficult to place patient Yes  Level of care: Med-Surg  Code Status: Full Family Communication:  DVT prophylaxis: Ambulatory and low risk for DVT Vaccination status: Discussed with daughter [name].  She states on the whole she does not believe in vaccinations but understands that if the facility requires her mother to be vaccinated prior to injury she will allow her mother to receive a vaccination.  I did let her know that some facilities require full vaccination and that with the mRNA vaccines to be fully vaccinated takes up to 6 weeks with a 2-week waiting period after the second vaccine given.  Stated that she preferred the Pfizer vaccine and did not want her mother to have the Janssen vaccine  Foley catheter: No  HPI: 60 y.o.Fwith Alzheimer's dementia with behavioral disturbance who presented with progressively erratic behavior over months.  See longer summary from H&P on 2/9, but in brief, patient had been living in [place] for years until family were informed by neighbors that patient was wandering, appeared unbathed, and she moved to [facility]. Here, she lived with daughter for about 2 years, but in  the last few months, she started to behave more impulsively, to at times seem aggressive and have verbal outbursts, severe insomnia, and so APS recommended the patient be brought to the ER.  In the ER, the patient was seen by Psychiatry and started on new Seroquel. She was held pending geri-psych placement, which was delayed for several months.  Daytime behaviors improved on high dose Seroquel but continues with sundowning behaviors. Added Celexa as mood stabilizer along with Tegretol on 2/14   Subjective: Sleeping. Awakened pleasantly. No complaints when asked.   Objective: Vitals:   04/14/20 1645 04/15/20 0445  BP: 125/84 109/82  Pulse: 74 66  Resp: 18 17  Temp: 98.6 F (37 C) 97.8 F (36.6 C)  SpO2: 99% 98%    Intake/Output Summary (Last 24 hours) at 04/15/2020 0759 Last data filed at 04/15/2020 0630 Gross per 24 hour  Intake 1257 ml  Output 0 ml  Net 1257 ml   Filed Weights   04/06/20 1949 04/08/20 2134 04/11/20 0300  Weight: 52.7 kg 53.5 kg 55.7 kg    Exam:  Constitutional: Calm, no acute distress Respiratory: Lungs are clear to auscultation and she is stable on room air Cardiovascular: Heart sounds are normal, her pulse is regular and without tachycardia, extremities  are warm to the touch and thin Abdomen: Soft nontender nondistended.  Variable oral intake with preference for sweets.  LBM 3/2 Neurologic: CN 2-12 grossly intact.  All extremities x4 without any focal neurological deficits.  Gait and ambulation not assessed. Psychiatric: Sleeping.  Awakened without any agitation.  Oriented to name only.     Assessment/Plan: Acute problems: Dementia with behavioral disturbance Behaviors seem improved without behaviors that would require IM Geodon, Haldol or Seroquel prn Continue high-dose Seroquel-QTC checked on 2/14 less than 500 and no additional dose changes have been made; 3/3 will attempt to obtain another EKG if patient will be able to cooperate. Continue  Seroquel PO prn for breakthrough behaviors Celexa (mood stabilizer) increased to 20 mg/day on 2/21 2/21 continued issues with recurrent agitation and sometimes aggressive behavior therefore will increase daytime Tegretol to 400 mg 8 am and 2 pm and 400 mg 6 pm.  3/2 LFTs repeated and are within normal limits Vistaril without any significant impact on behavior so was discontinued in favor of Ativan Trialed stopping daytime Ativan due to concerns over possible oversedation but patient became extremely agitated within 24 hours so daytime Ativan resumed Continue gabapentin 2/14 reevaluated by PT and given her status of custodial care not appropriate for acute PT.  Was also evaluated by OT and patient did not have comprehension to participate with OT sessions and was actually actively hallucinating talking on the phone with her cousin therefore no acute OT services indicated as well.  Pressure injury Right anterior hip/Stage 2 -Continue air mattress -Maximize nutrition (see below) and encourage mobility (OOB to chair/ambulate TID) Pressure Injury 04/01/20 Hip Anterior;Right Stage 2 -  Partial thickness loss of dermis presenting as a shallow open injury with a red, pink wound bed without slough. (Active)  Date First Assessed/Time First Assessed: 04/01/20 2230   Location: Hip  Location Orientation: Anterior;Right  Staging: Stage 2 -  Partial thickness loss of dermis presenting as a shallow open injury with a red, pink wound bed without slough.  Present ...    Assessments 04/01/2020 10:05 PM 04/14/2020  7:58 PM  Dressing Type Foam - Lift dressing to assess site every shift Foam - Lift dressing to assess site every shift  Dressing Clean;Dry;Intact Clean;Dry;Intact  Site / Wound Assessment Pink --  Drainage Amount None --     No Linked orders to display   Urinary incontinence -Pericare and other personal care difficult to deliver to patient given her underlying dementia and fear of strangers providing such  care -We will try to utilize mesh panties with peripads to minimize degree of personal-care to be given i.e. need to change clothes or change bed linens from significant incontinence of urine and or stool  Other problems: Moderate to severe protein calorie malnutrition -Continuefeeding supplements -Give ice cream or Magic cup twice daily -Albumin 2.9 on 2/14 Estimated body mass index is 19.82 kg/m as calculated from the following:   Height as of this encounter: 5\' 6"  (1.676 m).   Weight as of this encounter: 55.7 kg.  Transaminitis -Repeat LFTs ordered for 2/14 within normal limits and repeated again on 3/2 and remain within normal limits   Data Reviewed: Basic Metabolic Panel: Recent Labs  Lab 04/12/20 0340 04/14/20 0631  NA 139 140  K 3.9 4.2  CL 100 102  CO2 31 31  GLUCOSE 99 85  BUN 18 19  CREATININE 0.88 0.72  CALCIUM 9.0 9.1  MG 2.0  --    Liver Function Tests: Recent  Labs  Lab 04/14/20 0631  AST 23  ALT 26  ALKPHOS 67  BILITOT 0.5  PROT 5.6*  ALBUMIN 3.0*   No results for input(s): LIPASE, AMYLASE in the last 168 hours. Recent Labs  Lab 04/12/20 0340  AMMONIA 17   CBC: Recent Labs  Lab 04/12/20 0340  WBC 4.3  HGB 10.8*  HCT 32.5*  MCV 97.6  PLT 267   Cardiac Enzymes: No results for input(s): CKTOTAL, CKMB, CKMBINDEX, TROPONINI in the last 168 hours. BNP (last 3 results) No results for input(s): BNP in the last 8760 hours.  ProBNP (last 3 results) No results for input(s): PROBNP in the last 8760 hours.  CBG: No results for input(s): GLUCAP in the last 168 hours.  No results found for this or any previous visit (from the past 240 hour(s)).   Studies: No results found.  Scheduled Meds: . carbamazepine  400 mg Oral BID   And  . carbamazepine  400 mg Oral QPM  . citalopram  20 mg Oral Daily  . feeding supplement  237 mL Oral BID BM  . gabapentin  100 mg Oral BID  . LORazepam  0.5 mg Oral BID  . QUEtiapine  100 mg Oral BID  .  QUEtiapine  150 mg Oral QHS   Continuous Infusions:  Principal Problem:   Dementia with behavioral disturbance (HCC) Active Problems:   Major depressive disorder, recurrent episode (HCC)   History of asthma, mild, intermittent   Early Onset Dementia with behavioral problem (HCC)   Protein-calorie malnutrition, moderate (HCC)   Transaminitis   Pressure injury of skin   Alzheimer disease (HCC)   Pressure ulcer of right hip, stage 2 (HCC)   Consultants:  Psychiatry  Procedures:  None  Antibiotics: Anti-infectives (From admission, onward)   None       Time spent: 20 minutes    Junious Silk ANP  [facility] 7 am - 330 pm/M-F for direct patient care and secure chat Please refer to Amion for contact info 79  days

## 2020-04-16 DIAGNOSIS — F0391 Unspecified dementia with behavioral disturbance: Secondary | ICD-10-CM | POA: Diagnosis not present

## 2020-04-16 DIAGNOSIS — E440 Moderate protein-calorie malnutrition: Secondary | ICD-10-CM | POA: Diagnosis not present

## 2020-04-16 DIAGNOSIS — L89212 Pressure ulcer of right hip, stage 2: Secondary | ICD-10-CM | POA: Diagnosis not present

## 2020-04-16 NOTE — TOC Progression Note (Signed)
Transition of Care [facility]) - Progression Note    Patient Details  Name: ISABELLY KOBLER MRN: [id number] Date of Birth: 06-18-1960  [facility]) CM/SW Contact  Janae Bridgeman, RN Phone Number: 04/16/2020, 7:45 AM  Clinical Narrative:    Case management spoke with Corlis Hove, CM at [facility] and the facility is asking to review additional clinicals on the patient.  An updated initial referral, therapy notes and recent progress note were securely sent to the facility for review.  CM and MSW will continue to follow the patient for SNF placement for accepting facility.   Expected Discharge Plan: Skilled Nursing Facility Barriers to Discharge: Continued Medical Work up,Barriers Unresolved (comment),SNF Pending bed offer  Expected Discharge Plan and Services Expected Discharge Plan: Skilled Nursing Facility In-house Referral: Clinical Social Work Discharge Planning Services: CM Consult Post Acute Care Choice: Skilled Nursing Facility Living arrangements for the past 2 months: Apartment                                       Social Determinants of Health (SDOH) Interventions    Readmission Risk Interventions Readmission Risk Prevention Plan 04/06/2020  Post Dischage Appt Complete  Medication Screening Complete  Transportation Screening Complete  Some recent data might be hidden

## 2020-04-16 NOTE — Progress Notes (Signed)
[facility] PROGRESS NOTE  Sherry Hicks:[id number] DOB: 12/18/1960 DOA: 01/27/2020 PCP: [name], DO       03/29/2020                        04/12/2020   Status: The patient will require care spanning > 2 midnights and should be moved to inpatient because: Altered mental status and Unsafe d/c plan -difficult to place secondary to need for skilled nursing facility secondary to severe dementia.  Has required frequent medication adjustments to manage dementia related behaviors  Dispo:               The patient is from: Home              Anticipated d/c is to: SNF              Anticipated d/c date is: > 3 days              Barriers to discharge: Still awaiting SNF bed offer-behavioral disturbances secondary to severe dementia are better controlled with appropriate medications and diversions (baby doll).  [facility] reviewing   Difficult to place patient Yes  Level of care: Med-Surg  Code Status: Full Family Communication:  DVT prophylaxis: Ambulatory and low risk for DVT Vaccination status: Discussed with daughter [name].  She states on the whole she does not believe in vaccinations but understands that if the facility requires her mother to be vaccinated prior to injury she will allow her mother to receive a vaccination.  I did let her know that some facilities require full vaccination and that with the mRNA vaccines to be fully vaccinated takes up to 6 weeks with a 2-week waiting period after the second vaccine given.  Stated that she preferred the Pfizer vaccine and did not want her mother to have the Janssen vaccine  Foley catheter: No  HPI: 60 y.o.Fwith Alzheimer's dementia with behavioral disturbance who presented with progressively erratic behavior over months.  See longer summary from H&P on 2/9, but in brief, patient had been living in [place] for years until family were informed by neighbors that patient was wandering, appeared unbathed, and  she moved to [facility]. Here, she lived with daughter for about 2 years, but in the last few months, she started to behave more impulsively, to at times seem aggressive and have verbal outbursts, severe insomnia, and so APS recommended the patient be brought to the ER.  In the ER, the patient was seen by Psychiatry and started on new Seroquel. She was held pending geri-psych placement, which was delayed for several months.  Daytime behaviors improved on high dose Seroquel but continues with sundowning behaviors. Added Celexa as mood stabilizer along with Tegretol on 2/14   Subjective: Sleeping.  Awakened.  Again smiled and became more calm when her baby doll was placed next to her under the covers.   Objective: Vitals:   04/15/20 1804 04/16/20 0400  BP: 131/78 104/69  Pulse: 97 70  Resp: 16 16  Temp: 100 F (37.8 C) 98.4 F (36.9 C)  SpO2: 97% 99%    Intake/Output Summary (Last 24 hours) at 04/16/2020 0800 Last data filed at 04/15/2020 1825 Gross per 24 hour  Intake 1434 ml  Output 0 ml  Net 1434 ml   Filed Weights   04/08/20 2134 04/11/20 0300 04/16/20 0400  Weight: 53.5 kg 55.7 kg 57.7 kg    Exam:  Constitutional: Awakened, calm, no acute distress Respiratory:  Anterior lung sounds clear to auscultation and she is stable on room air Cardiovascular: Heart sounds are normal pulse is regular, extremities warm to touch Abdomen: Abdomen soft with normoactive bowel sounds.  Marginal oral intake and does better when assisted with diet.  LBM 3/2 Neurologic: CN 2-12 grossly intact.  All extremities x4 without any focal neurological deficits.  Gait and ambulation not assessed. Psychiatric: Wakened.  Calm.  Oriented to name only.     Assessment/Plan: Acute problems: Dementia with behavioral disturbance Behaviors seem improved without behaviors that would require IM Geodon, Haldol or Seroquel prn Continue high-dose Seroquel-QTC checked on 2/14 less than 500 and no additional dose  changes have been made; 3/3 will attempt to obtain another EKG if patient will be able to cooperate. Continue Seroquel PO prn for breakthrough behaviors Celexa (mood stabilizer) increased to 20 mg/day on 2/21 2/21 continued issues with recurrent agitation and sometimes aggressive behavior therefore will increase daytime Tegretol to 400 mg 8 am and 2 pm and 400 mg 6 pm.  3/2 LFTs repeated and are within normal limits Vistaril without any significant impact on behavior so was discontinued in favor of Ativan Trialed stopping daytime Ativan due to concerns over possible oversedation but patient became extremely agitated within 24 hours so daytime Ativan resumed Continue gabapentin 2/14 reevaluated by PT and given her status of custodial care not appropriate for acute PT.  Was also evaluated by OT and patient did not have comprehension to participate with OT sessions and was actually actively hallucinating talking on the phone with her cousin therefore no acute OT services indicated as well.  Pressure injury Right anterior hip/Stage 2 -Continue air mattress -Maximize nutrition (see below) and encourage mobility (OOB to chair/ambulate TID) Pressure Injury 04/01/20 Hip Anterior;Right Stage 2 -  Partial thickness loss of dermis presenting as a shallow open injury with a red, pink wound bed without slough. (Active)  Date First Assessed/Time First Assessed: 04/01/20 2230   Location: Hip  Location Orientation: Anterior;Right  Staging: Stage 2 -  Partial thickness loss of dermis presenting as a shallow open injury with a red, pink wound bed without slough.  Present ...    Assessments 04/01/2020 10:05 PM 04/15/2020  8:00 PM  Dressing Type Foam - Lift dressing to assess site every shift None  Dressing Clean;Dry;Intact -  Site / Wound Assessment Pink -  Drainage Amount None None     No Linked orders to display   Urinary incontinence -Pericare and other personal care difficult to deliver to patient given her  underlying dementia and fear of strangers providing such care -We will try to utilize mesh panties with peripads to minimize degree of personal-care to be given i.e. need to change clothes or change bed linens from significant incontinence of urine and or stool  Other problems: Moderate to severe protein calorie malnutrition -Continuefeeding supplements -Give ice cream or Magic cup twice daily -Albumin 2.9 on 2/14 Estimated body mass index is 20.53 kg/m as calculated from the following:   Height as of this encounter: 5\' 6"  (1.676 m).   Weight as of this encounter: 57.7 kg.  Transaminitis -Repeat LFTs ordered for 2/14 within normal limits and repeated again on 3/2 and remain within normal limits   Data Reviewed: Basic Metabolic Panel: Recent Labs  Lab 04/12/20 0340 04/14/20 0631  NA 139 140  K 3.9 4.2  CL 100 102  CO2 31 31  GLUCOSE 99 85  BUN 18 19  CREATININE 0.88 0.72  CALCIUM 9.0  9.1  MG 2.0  --    Liver Function Tests: Recent Labs  Lab 04/14/20 0631  AST 23  ALT 26  ALKPHOS 67  BILITOT 0.5  PROT 5.6*  ALBUMIN 3.0*   No results for input(s): LIPASE, AMYLASE in the last 168 hours. Recent Labs  Lab 04/12/20 0340  AMMONIA 17   CBC: Recent Labs  Lab 04/12/20 0340  WBC 4.3  HGB 10.8*  HCT 32.5*  MCV 97.6  PLT 267   Cardiac Enzymes: No results for input(s): CKTOTAL, CKMB, CKMBINDEX, TROPONINI in the last 168 hours. BNP (last 3 results) No results for input(s): BNP in the last 8760 hours.  ProBNP (last 3 results) No results for input(s): PROBNP in the last 8760 hours.  CBG: No results for input(s): GLUCAP in the last 168 hours.  No results found for this or any previous visit (from the past 240 hour(s)).   Studies: No results found.  Scheduled Meds: . carbamazepine  400 mg Oral BID   And  . carbamazepine  400 mg Oral QPM  . citalopram  20 mg Oral Daily  . feeding supplement  237 mL Oral BID BM  . gabapentin  100 mg Oral BID  . LORazepam   0.5 mg Oral BID  . QUEtiapine  100 mg Oral BID  . QUEtiapine  150 mg Oral QHS   Continuous Infusions:  Principal Problem:   Dementia with behavioral disturbance (HCC) Active Problems:   Major depressive disorder, recurrent episode (HCC)   History of asthma, mild, intermittent   Early Onset Dementia with behavioral problem (HCC)   Protein-calorie malnutrition, moderate (HCC)   Transaminitis   Pressure injury of skin   Alzheimer disease (HCC)   Pressure ulcer of right hip, stage 2 (HCC)   Consultants:  Psychiatry  Procedures:  None  Antibiotics: Anti-infectives (From admission, onward)   None       Time spent: 20 minutes    Junious Silk ANP  [facility] 7 am - 330 pm/M-F for direct patient care and secure chat Please refer to Amion for contact info 79  days

## 2020-04-16 NOTE — Progress Notes (Addendum)
CSW was notified by Gavin Pound at [facility] that the facility cannot offer a bed at this time.  CSW received notification from [place] at [facility] that the facility cannot offer a bed at this time.  Edwin Dada, MSW, LCSW Transitions of Care  Clinical Social Worker II [phone]

## 2020-04-17 DIAGNOSIS — F0391 Unspecified dementia with behavioral disturbance: Secondary | ICD-10-CM | POA: Diagnosis not present

## 2020-04-17 LAB — CBC
HCT: 35.4 % — ABNORMAL LOW (ref 36.0–46.0)
Hemoglobin: 11.4 g/dL — ABNORMAL LOW (ref 12.0–15.0)
MCH: 31.7 pg (ref 26.0–34.0)
MCHC: 32.2 g/dL (ref 30.0–36.0)
MCV: 98.3 fL (ref 80.0–100.0)
Platelets: 264 10*3/uL (ref 150–400)
RBC: 3.6 MIL/uL — ABNORMAL LOW (ref 3.87–5.11)
RDW: 12.2 % (ref 11.5–15.5)
WBC: 4.6 10*3/uL (ref 4.0–10.5)
nRBC: 0 % (ref 0.0–0.2)

## 2020-04-17 LAB — COMPREHENSIVE METABOLIC PANEL
ALT: 23 U/L (ref 0–44)
AST: 22 U/L (ref 15–41)
Albumin: 3.1 g/dL — ABNORMAL LOW (ref 3.5–5.0)
Alkaline Phosphatase: 69 U/L (ref 38–126)
Anion gap: 7 (ref 5–15)
BUN: 18 mg/dL (ref 6–20)
CO2: 30 mmol/L (ref 22–32)
Calcium: 9.2 mg/dL (ref 8.9–10.3)
Chloride: 101 mmol/L (ref 98–111)
Creatinine, Ser: 0.79 mg/dL (ref 0.44–1.00)
GFR, Estimated: >60 mL/min (ref >60)
Glucose, Bld: 87 mg/dL (ref 70–99)
Potassium: 4.2 mmol/L (ref 3.5–5.1)
Sodium: 138 mmol/L (ref 135–145)
Total Bilirubin: 0.5 mg/dL (ref 0.3–1.2)
Total Protein: 5.6 g/dL — ABNORMAL LOW (ref 6.5–8.1)

## 2020-04-17 NOTE — Progress Notes (Signed)
[facility] PROGRESS NOTE  Sherry Hicks IWL:[id number] DOB: 12/31/1960 DOA: 01/27/2020 PCP: [name], DO       03/29/2020                        04/12/2020   Status: The patient will require care spanning > 2 midnights and should be moved to inpatient because: Altered mental status and Unsafe d/c plan -difficult to place secondary to need for skilled nursing facility secondary to severe dementia.  Has required frequent medication adjustments to manage dementia related behaviors  Dispo:               The patient is from: Home              Anticipated d/c is to: SNF              Anticipated d/c date is: > 3 days              Barriers to discharge: Still awaiting SNF bed offer-behavioral disturbances secondary to severe dementia are better controlled with appropriate medications and diversions (baby doll).  [facility] reviewing   Difficult to place patient Yes  Level of care: Med-Surg  Code Status: Full Family Communication:  DVT prophylaxis: Ambulatory and low risk for DVT Vaccination status: Discussed with daughter [name].  She states on the whole she does not believe in vaccinations but understands that if the facility requires her mother to be vaccinated prior to injury she will allow her mother to receive a vaccination.  I did let her know that some facilities require full vaccination and that with the mRNA vaccines to be fully vaccinated takes up to 6 weeks with a 2-week waiting period after the second vaccine given.  Stated that she preferred the Pfizer vaccine and did not want her mother to have the Janssen vaccine  Foley catheter: No  HPI: 60 y.o.Fwith Alzheimer's dementia with behavioral disturbance who presented with progressively erratic behavior over months.  See longer summary from H&P on 2/9, but in brief, patient had been living in [place] for years until family were informed by neighbors that patient was wandering, appeared unbathed, and  she moved to [facility] Here, she lived with daughter for about 2 years, but in the last few months, she started to behave more impulsively, to at times seem aggressive and have verbal outbursts, severe insomnia, and so APS recommended the patient be brought to the ER.  In the ER, the patient was seen by Psychiatry and started on new Seroquel. She was held pending geri-psych placement, which was delayed for several months.  Daytime behaviors improved on high dose Seroquel but continues with sundowning behaviors. Added Celexa as mood stabilizer along with Tegretol on 2/14   Subjective:  She is quite sleepy although was yelling out earlier today  Objective: Vitals:   04/17/20 0438 04/17/20 0954  BP: 101/83 119/85  Pulse: 61 77  Resp: 16 16  Temp: (!) 97.4 F (36.3 C) 98.1 F (36.7 C)  SpO2: 97% 100%    Intake/Output Summary (Last 24 hours) at 04/17/2020 1549 Last data filed at 04/17/2020 1200 Gross per 24 hour  Intake 720 ml  Output 0 ml  Net 720 ml   Filed Weights   04/08/20 2134 04/11/20 0300 04/16/20 0400  Weight: 53.5 kg 55.7 kg 57.7 kg    Exam:  Sleepy frail black female no distress significant bitemporal wasting S1-S2 no murmur no rub no gallop Abdomen soft  no rebound no guarding CTA B Slightly contracted lower extremities Sacrum and lower feet and heels not examined     Assessment/Plan: Acute problems: Dementia with behavioral disturbance Not required IM Geodon, Haldol or Seroquel prn QTc was 420 on 04/15/2020 Continue Seroquel PO 100 twice daily, 150 at bedtime and as needed 50 twice daily agitation prn for breakthrough behaviors Celexa (mood stabilizer) increased to 20 mg/day on 2/21 2/21 continued issues with recurrent agitation and sometimes aggressive behavior therefore will increase daytime Tegretol to 400 mg 8 am and 2 pm and 400 mg 6 pm.   3/2 LFTs repeated and are within normal limits Vistaril effective therefore discontinued in favor of Ativan Trialed  stopping daytime Ativan due to concerns over possible oversedation but patient became extremely agitated within 24 hours so daytime Ativan resumed Continue gabapentin 2/14 reevaluated by PT and given her status of custodial care not appropriate for acute PT.  Was also evaluated by OT and patient did not have comprehension to participate with OT sessions and was actually actively hallucinating talking on the phone with her cousin therefore no acute OT services indicated as well.  Pressure injury Right anterior hip/Stage 2 -Continue air mattress -Maximize nutrition (see below) and encourage mobility (OOB to chair/ambulate TID) Pressure Injury 04/01/20 Hip Anterior;Right Stage 2 -  Partial thickness loss of dermis presenting as a shallow open injury with a red, pink wound bed without slough. (Active)  Date First Assessed/Time First Assessed: 04/01/20 2230   Location: Hip  Location Orientation: Anterior;Right  Staging: Stage 2 -  Partial thickness loss of dermis presenting as a shallow open injury with a red, pink wound bed without slough.  Present ...    Assessments 04/01/2020 10:05 PM 04/16/2020 11:20 AM  Dressing Type Foam - Lift dressing to assess site every shift None  Dressing Clean;Dry;Intact Clean;Dry;Intact  State of Healing - Scar  Site / Wound Assessment Pink Clean;Dry  Drainage Amount None -     No Linked orders to display   Urinary incontinence -Pericare and other personal care difficult to deliver to patient given her underlying dementia and fear of strangers providing such care -use mesh panties with peripads to minimize degree of personal-care to be given i.e. need to change clothes or change bed linens from significant incontinence of urine and or stool  Other problems: Moderate to severe protein calorie malnutrition -Continuefeeding supplements -Give ice cream or Magic cup twice daily -Albumin 2.9 on 2/14 Estimated body mass index is 20.53 kg/m as calculated from the  following:   Height as of this encounter: 5\' 6"  (1.676 m).   Weight as of this encounter: 57.7 kg.  Transaminitis -Repeat LFTs ordered for 2/14 within normal limits and repeated again on 3/2 and remain within normal limits   Data Reviewed: Basic Metabolic Panel: Recent Labs  Lab 04/12/20 0340 04/14/20 0631 04/17/20 0448  NA 139 140 138  K 3.9 4.2 4.2  CL 100 102 101  CO2 31 31 30   GLUCOSE 99 85 87  BUN 18 19 18   CREATININE 0.88 0.72 0.79  CALCIUM 9.0 9.1 9.2  MG 2.0  --   --    Liver Function Tests: Recent Labs  Lab 04/14/20 0631 04/17/20 0448  AST 23 22  ALT 26 23  ALKPHOS 67 69  BILITOT 0.5 0.5  PROT 5.6* 5.6*  ALBUMIN 3.0* 3.1*   No results for input(s): LIPASE, AMYLASE in the last 168 hours. Recent Labs  Lab 04/12/20 0340  AMMONIA 17  CBC: Recent Labs  Lab 04/12/20 0340 04/17/20 0448  WBC 4.3 4.6  HGB 10.8* 11.4*  HCT 32.5* 35.4*  MCV 97.6 98.3  PLT 267 264   Cardiac Enzymes: No results for input(s): CKTOTAL, CKMB, CKMBINDEX, TROPONINI in the last 168 hours. BNP (last 3 results) No results for input(s): BNP in the last 8760 hours.  ProBNP (last 3 results) No results for input(s): PROBNP in the last 8760 hours.  CBG: No results for input(s): GLUCAP in the last 168 hours.  No results found for this or any previous visit (from the past 240 hour(s)).   Studies: No results found.  Scheduled Meds: . carbamazepine  400 mg Oral BID   And  . carbamazepine  400 mg Oral QPM  . citalopram  20 mg Oral Daily  . feeding supplement  237 mL Oral BID BM  . gabapentin  100 mg Oral BID  . LORazepam  0.5 mg Oral BID  . QUEtiapine  100 mg Oral BID  . QUEtiapine  150 mg Oral QHS   Continuous Infusions:  Principal Problem:   Dementia with behavioral disturbance (HCC) Active Problems:   Major depressive disorder, recurrent episode (HCC)   History of asthma, mild, intermittent   Early Onset Dementia with behavioral problem (HCC)   Protein-calorie  malnutrition, moderate (HCC)   Transaminitis   Pressure injury of skin   Alzheimer disease (HCC)   Pressure ulcer of right hip, stage 2 (HCC)   Consultants:  Psychiatry  Procedures:  None  Antibiotics: Anti-infectives (From admission, onward)   None       Time spent: 20 minutes  Pleas Koch, MD [facility] 3:55 PM  79  days

## 2020-04-18 DIAGNOSIS — F0391 Unspecified dementia with behavioral disturbance: Secondary | ICD-10-CM | POA: Diagnosis not present

## 2020-04-18 NOTE — Progress Notes (Signed)
[facility] PROGRESS NOTE  Sherry Hicks RCV:[id number] DOB: 11-22-1960 DOA: 01/27/2020 PCP: [name], DO       03/29/2020                        04/12/2020   Status: The patient will require care spanning > 2 midnights and should be moved to inpatient because: Altered mental status and Unsafe d/c plan -difficult to place secondary to need for skilled nursing facility secondary to severe dementia.  Has required frequent medication adjustments to manage dementia related behaviors  Dispo:               The patient is from: Home              Anticipated d/c is to: SNF              Anticipated d/c date is: > 3 days              Barriers to discharge: Still awaiting SNF bed offer-behavioral disturbances secondary to severe dementia are better controlled with appropriate medications and diversions (baby doll).  [facility] reviewing   Difficult to place patient Yes  Level of care: Med-Surg  Code Status: Full Family Communication:  DVT prophylaxis: Ambulatory and low risk for DVT Vaccination status: Discussed with daughter Sherry Hicks.  She states on the whole she does not believe in vaccinations but understands that if the facility requires her mother to be vaccinated prior to injury she will allow her mother to receive a vaccination.  I did let her know that some facilities require full vaccination and that with the mRNA vaccines to be fully vaccinated takes up to 6 weeks with a 2-week waiting period after the second vaccine given.  Stated that she preferred the Pfizer vaccine and did not want her mother to have the Janssen vaccine  Foley catheter: No  HPI: 60 y.o.Fwith Alzheimer's dementia with behavioral disturbance who presented with progressively erratic behavior over months.  See longer summary from H&P on 2/9, but in brief, patient had been living in [place] for years until family were informed by neighbors that patient was wandering, appeared unbathed, and  she moved to [facility]. Here, she lived with daughter for about 2 years, but in the last few months, she started to behave more impulsively, to at times seem aggressive and have verbal outbursts, severe insomnia, and so APS recommended the patient be brought to the ER.  In the ER, the patient was seen by Psychiatry and started on new Seroquel. She was held pending geri-psych placement, which was delayed for several months.  Daytime behaviors improved on high dose Seroquel but continues with sundowning behaviors. Added Celexa as mood stabilizer along with Tegretol on 2/14   Subjective:  Calmer today Seems to scream out and become agitated when she is "wet"   Objective: Vitals:   04/17/20 1729 04/18/20 0624  BP: 125/85 125/77  Pulse: 91 70  Resp: 18 18  Temp: 98.1 F (36.7 C) 98.2 F (36.8 C)  SpO2: 100% 100%    Intake/Output Summary (Last 24 hours) at 04/18/2020 1455 Last data filed at 04/18/2020 0902 Gross per 24 hour  Intake 360 ml  Output 0 ml  Net 360 ml   Filed Weights   04/08/20 2134 04/11/20 0300 04/16/20 0400  Weight: 53.5 kg 55.7 kg 57.7 kg    Exam: No big changes to exam   Frail black female no distress significant bitemporal wasting  S1-S2 no murmur no rub no gallop Abdomen soft no rebound no guarding CTA B Slightly contracted lower extremities Sacrum and lower feet and heels not examined     Assessment/Plan: Acute problems: Dementia with behavioral disturbance Not required IM Geodon, Haldol or Seroquel prn QTc was 420 on 04/15/2020 Continue Seroquel PO 100 twice daily, 150 at bedtime and as needed 50 twice daily agitation prn for breakthrough behaviors Celexa (mood stabilizer) increased to 20 mg/day on 2/21 2/21 continued issues with recurrent agitation and sometimes aggressive behavior therefore will increase daytime Tegretol to 400 mg 8 am and 2 pm and 400 mg 6 pm.   3/2 LFTs repeated and are within normal limits Vistaril effective therefore discontinued  in favor of Ativan Trialed stopping daytime Ativan due to concerns over possible oversedation but patient became extremely agitated within 24 hours so daytime Ativan resumed Continue gabapentin 2/14 reevaluated by PT and given her status of custodial care not appropriate for acute PT.  Was also evaluated by OT and patient did not have comprehension to participate with OT sessions and was actually actively hallucinating talking on the phone with her cousin therefore no acute OT services indicated as well.  Pressure injury Right anterior hip/Stage 2 -Continue air mattress -Maximize nutrition (see below) and encourage mobility (OOB to chair/ambulate TID) Pressure Injury 04/01/20 Hip Anterior;Right Stage 2 -  Partial thickness loss of dermis presenting as a shallow open injury with a red, pink wound bed without slough. (Active)  Date First Assessed/Time First Assessed: 04/01/20 2230   Location: Hip  Location Orientation: Anterior;Right  Staging: Stage 2 -  Partial thickness loss of dermis presenting as a shallow open injury with a red, pink wound bed without slough.  Present ...    Assessments 04/01/2020 10:05 PM 04/18/2020  9:00 AM  Dressing Type Foam - Lift dressing to assess site every shift Foam - Lift dressing to assess site every shift  Dressing Clean;Dry;Intact Clean;Dry;Intact  Site / Wound Assessment Pink -  Margins - Attached edges (approximated)  Drainage Amount None None     No Linked orders to display   Urinary incontinence -Pericare and other personal care difficult to deliver to patient given her underlying dementia and fear of strangers providing such care -use mesh panties with peripads to minimize degree of personal-care to be given i.e. need to change clothes or change bed linens from significant incontinence of urine and or stool  Other problems: Moderate to severe protein calorie malnutrition -Continuefeeding supplements -Give ice cream or Magic cup twice daily -Albumin 2.9  on 2/14 Estimated body mass index is 20.53 kg/m as calculated from the following:   Height as of this encounter: 5\' 6"  (1.676 m).   Weight as of this encounter: 57.7 kg.  Transaminitis -Repeat LFTs ordered for 2/14 within normal limits and repeated again on 3/2 and remain within normal limits   Data Reviewed: Basic Metabolic Panel: Recent Labs  Lab 04/12/20 0340 04/14/20 0631 04/17/20 0448  NA 139 140 138  K 3.9 4.2 4.2  CL 100 102 101  CO2 31 31 30   GLUCOSE 99 85 87  BUN 18 19 18   CREATININE 0.88 0.72 0.79  CALCIUM 9.0 9.1 9.2  MG 2.0  --   --    Liver Function Tests: Recent Labs  Lab 04/14/20 0631 04/17/20 0448  AST 23 22  ALT 26 23  ALKPHOS 67 69  BILITOT 0.5 0.5  PROT 5.6* 5.6*  ALBUMIN 3.0* 3.1*   No results for  input(s): LIPASE, AMYLASE in the last 168 hours. Recent Labs  Lab 04/12/20 0340  AMMONIA 17   CBC: Recent Labs  Lab 04/12/20 0340 04/17/20 0448  WBC 4.3 4.6  HGB 10.8* 11.4*  HCT 32.5* 35.4*  MCV 97.6 98.3  PLT 267 264   Cardiac Enzymes: No results for input(s): CKTOTAL, CKMB, CKMBINDEX, TROPONINI in the last 168 hours. BNP (last 3 results) No results for input(s): BNP in the last 8760 hours.  ProBNP (last 3 results) No results for input(s): PROBNP in the last 8760 hours.  CBG: No results for input(s): GLUCAP in the last 168 hours.  No results found for this or any previous visit (from the past 240 hour(s)).   Studies: No results found.  Scheduled Meds: . carbamazepine  400 mg Oral BID   And  . carbamazepine  400 mg Oral QPM  . citalopram  20 mg Oral Daily  . feeding supplement  237 mL Oral BID BM  . gabapentin  100 mg Oral BID  . LORazepam  0.5 mg Oral BID  . QUEtiapine  100 mg Oral BID  . QUEtiapine  150 mg Oral QHS   Continuous Infusions:  Principal Problem:   Dementia with behavioral disturbance (HCC) Active Problems:   Major depressive disorder, recurrent episode (HCC)   History of asthma, mild, intermittent    Early Onset Dementia with behavioral problem (HCC)   Protein-calorie malnutrition, moderate (HCC)   Transaminitis   Pressure injury of skin   Alzheimer disease (HCC)   Pressure ulcer of right hip, stage 2 (HCC)   Consultants:  Psychiatry  Procedures:  None  Antibiotics: Anti-infectives (From admission, onward)   None       Time spent: 20 minutes  Pleas Koch, MD [facility] 2:55 PM  79  days

## 2020-04-19 DIAGNOSIS — F0391 Unspecified dementia with behavioral disturbance: Secondary | ICD-10-CM | POA: Diagnosis not present

## 2020-04-19 NOTE — Progress Notes (Signed)
CSW spoke with Meryle Ready at [facility] - facility unable to offer a bed at this time.  Edwin Dada, MSW, LCSW Transitions of Care  Clinical Social Worker II [phone]

## 2020-04-19 NOTE — Progress Notes (Signed)
Patient reviewed seen at bedside briefly No specific distress At times somewhat agitated but redirectable when her diaper exchanged She is more awake than she has been over the past several days It is difficult to get any history out of her BP 129/82 (BP Location: Right Arm)   Pulse 98   Temp 98.3 F (36.8 C)   Resp 16   Ht 5\' 6"  (1.676 m)   Wt 57.7 kg   SpO2 97%   BMI 20.53 kg/m     We will see again in more detail in a.m.  , MD [facility] 5:35 PM

## 2020-04-20 DIAGNOSIS — F0391 Unspecified dementia with behavioral disturbance: Secondary | ICD-10-CM | POA: Diagnosis not present

## 2020-04-20 DIAGNOSIS — E440 Moderate protein-calorie malnutrition: Secondary | ICD-10-CM | POA: Diagnosis not present

## 2020-04-20 DIAGNOSIS — L89212 Pressure ulcer of right hip, stage 2: Secondary | ICD-10-CM | POA: Diagnosis not present

## 2020-04-20 NOTE — Progress Notes (Signed)
CSW spoke with Georgeann Oppenheim at [facility] to request she review this patient - clinicals sent for review.  Edwin Dada, MSW, LCSW Transitions of Care   Clinical Social Worker II [phone]

## 2020-04-20 NOTE — Progress Notes (Signed)
[facility] PROGRESS NOTE  Sherry Hicks OAC:[id number] DOB: 10/02/1960 DOA: 01/27/2020 PCP: Sherry Jumbo, DO       03/29/2020                        04/12/2020   Status: The patient will require care spanning > 2 midnights and should be moved to inpatient because: Altered mental status and Unsafe d/c plan -difficult to place secondary to need for skilled nursing facility secondary to severe dementia.  Has required frequent medication adjustments to manage dementia related behaviors  Dispo:               The patient is from: Home              Anticipated d/c is to: SNF              Anticipated d/c date is: > 3 days              Barriers to discharge: Still awaiting SNF bed offer-behavioral disturbances secondary to severe dementia are better controlled with appropriate medications and diversions (baby doll).  Clinicals have been faxed out to multiple facilities but still no firm bed offers as of 3/8   Difficult to place patient Yes  Level of care: Med-Surg  Code Status: Full Family Communication:  DVT prophylaxis: Ambulatory and low risk for DVT Vaccination status: Discussed with daughter Sherry Hicks.  She states on the whole she does not believe in vaccinations but understands that if the facility requires her mother to be vaccinated prior to injury she will allow her mother to receive a vaccination.  I did let her know that some facilities require full vaccination and that with the mRNA vaccines to be fully vaccinated takes up to 6 weeks with a 2-week waiting period after the second vaccine given.  Stated that she preferred the Pfizer vaccine and did not want her mother to have the Janssen vaccine  Foley catheter: No  HPI: 60 y.o.Fwith Alzheimer's dementia with behavioral disturbance who presented with progressively erratic behavior over months.  See longer summary from H&P on 2/9, but in brief, patient had been living in [place] for years until family were informed by  neighbors that patient was wandering, appeared unbathed, and she moved to [facility]. Here, she lived with daughter for about 2 years, but in the last few months, she started to behave more impulsively, to at times seem aggressive and have verbal outbursts, severe insomnia, and so APS recommended the patient be brought to the ER.  In the ER, the patient was seen by Psychiatry and started on new Seroquel. She was held pending geri-psych placement, which was delayed for several months.  Daytime behaviors improved on high dose Seroquel but continues with sundowning behaviors. Added Celexa as mood stabilizer along with Tegretol on 2/14.  Behaviors and nocturnal sleep also improved with the addition of scheduled twice daily Ativan.  Of note patient much more calm after her daughter brought up multiple items from home including a baby doll which the patient "cares for" regularly.   Subjective: Patient awake and smiling.  Very cooperative.  Eager to have her baby doll placed back in the bed with her   Objective: Vitals:   04/19/20 0607 04/19/20 1130  BP: 128/87 129/82  Pulse: 99 98  Resp: 18 16  Temp: 97.7 F (36.5 C) 98.3 F (36.8 C)  SpO2: 100% 97%    Intake/Output Summary (Last 24 hours) at 04/20/2020 0753  Last data filed at 04/20/2020 0700 Gross per 24 hour  Intake 1977 ml  Output 0 ml  Net 1977 ml   Filed Weights   04/08/20 2134 04/11/20 0300 04/16/20 0400  Weight: 53.5 kg 55.7 kg 57.7 kg    Exam:  Constitutional:, Pleasant, calm and in no acute distress Respiratory: Anterior lung sounds are clear and she is stable on room air Cardiovascular: Sounds are normal, pulses regular and nontachycardic, extremities warm to touch, no peripheral edema Abdomen: Nontender.  Bowel sounds present.  Eats best when assisted with food.  LBM 3/6 Neurologic: CN 2-12 grossly intact.  All extremities x4 without any focal neurological deficits.  Gait and ambulation not assessed. Psychiatric: Awake,  pleasant.  Oriented to name only.     Assessment/Plan: Acute problems: Dementia with behavioral disturbance Behaviors seem improved without behaviors that would require IM Geodon, Haldol or Seroquel prn Continue high-dose Seroquel-QTC checked on 2/14 less than 500 and no additional dose changes have been made; 3/3 will attempt to obtain another EKG if patient will be able to cooperate. Continue Seroquel PO prn for breakthrough behaviors Celexa (mood stabilizer) increased to 20 mg/day on 2/21 2/21 continued issues with recurrent agitation and sometimes aggressive behavior therefore will increase daytime Tegretol to 400 mg 8 am and 2 pm and 400 mg 6 pm.  3/2 LFTs repeated and are within normal limits Vistaril without any significant impact on behavior so was discontinued in favor of Ativan Trialed stopping daytime Ativan due to concerns over possible oversedation but patient became extremely agitated within 24 hours so daytime Ativan resumed Continue gabapentin 2/14 reevaluated by PT and given her status of custodial care not appropriate for acute PT.  Was also evaluated by OT and patient did not have comprehension to participate with OT sessions and was actually actively hallucinating talking on the phone with her cousin therefore no acute OT services indicated as well.  Pressure injury Right anterior hip/Stage 2 -Continue air mattress -Maximize nutrition (see below) and encourage mobility (OOB to chair/ambulate TID) Pressure Injury 04/01/20 Hip Anterior;Right Stage 2 -  Partial thickness loss of dermis presenting as a shallow open injury with a red, pink wound bed without slough. (Active)  Date First Assessed/Time First Assessed: 04/01/20 2230   Location: Hip  Location Orientation: Anterior;Right  Staging: Stage 2 -  Partial thickness loss of dermis presenting as a shallow open injury with a red, pink wound bed without slough.  Present ...    Assessments 04/01/2020 10:05 PM 04/19/2020  7:21 PM   Dressing Type Foam - Lift dressing to assess site every shift Foam - Lift dressing to assess site every shift  Dressing Clean;Dry;Intact Clean;Dry;Intact  Site / Wound Assessment Pink --  Drainage Amount None --     No Linked orders to display   Urinary incontinence -Pericare and other personal care difficult to deliver to patient given her underlying dementia and fear of strangers providing such care -We will try to utilize mesh panties with peripads to minimize degree of personal-care to be given i.e. need to change clothes or change bed linens from significant incontinence of urine and or stool  Other problems: Moderate to severe protein calorie malnutrition -Continuefeeding supplements -Give ice cream or Magic cup twice daily -Albumin 2.9 on 2/14 Estimated body mass index is 20.53 kg/m as calculated from the following:   Height as of this encounter: 5\' 6"  (1.676 m).   Weight as of this encounter: 57.7 kg.  Transaminitis -Repeat LFTs ordered for  2/14 within normal limits and repeated again on 3/2 and remain within normal limits   Data Reviewed: Basic Metabolic Panel: Recent Labs  Lab 04/14/20 0631 04/17/20 0448  NA 140 138  K 4.2 4.2  CL 102 101  CO2 31 30  GLUCOSE 85 87  BUN 19 18  CREATININE 0.72 0.79  CALCIUM 9.1 9.2   Liver Function Tests: Recent Labs  Lab 04/14/20 0631 04/17/20 0448  AST 23 22  ALT 26 23  ALKPHOS 67 69  BILITOT 0.5 0.5  PROT 5.6* 5.6*  ALBUMIN 3.0* 3.1*   No results for input(s): LIPASE, AMYLASE in the last 168 hours. No results for input(s): AMMONIA in the last 168 hours. CBC: Recent Labs  Lab 04/17/20 0448  WBC 4.6  HGB 11.4*  HCT 35.4*  MCV 98.3  PLT 264   Cardiac Enzymes: No results for input(s): CKTOTAL, CKMB, CKMBINDEX, TROPONINI in the last 168 hours. BNP (last 3 results) No results for input(s): BNP in the last 8760 hours.  ProBNP (last 3 results) No results for input(s): PROBNP in the last 8760  hours.  CBG: No results for input(s): GLUCAP in the last 168 hours.  No results found for this or any previous visit (from the past 240 hour(s)).   Studies: No results found.  Scheduled Meds: . carbamazepine  400 mg Oral BID   And  . carbamazepine  400 mg Oral QPM  . citalopram  20 mg Oral Daily  . feeding supplement  237 mL Oral BID BM  . gabapentin  100 mg Oral BID  . LORazepam  0.5 mg Oral BID  . QUEtiapine  100 mg Oral BID  . QUEtiapine  150 mg Oral QHS   Continuous Infusions:  Principal Problem:   Dementia with behavioral disturbance (HCC) Active Problems:   Major depressive disorder, recurrent episode (HCC)   History of asthma, mild, intermittent   Early Onset Dementia with behavioral problem (HCC)   Protein-calorie malnutrition, moderate (HCC)   Transaminitis   Pressure injury of skin   Alzheimer disease (HCC)   Pressure ulcer of right hip, stage 2 (HCC)   Consultants:  Psychiatry  Procedures:  None  Antibiotics: Anti-infectives (From admission, onward)   None       Time spent: 20 minutes    Junious Silk ANP  [facility] 7 am - 330 pm/M-F for direct patient care and secure chat Please refer to Amion for contact info 79  days

## 2020-04-21 DIAGNOSIS — F0391 Unspecified dementia with behavioral disturbance: Secondary | ICD-10-CM | POA: Diagnosis not present

## 2020-04-21 DIAGNOSIS — E440 Moderate protein-calorie malnutrition: Secondary | ICD-10-CM | POA: Diagnosis not present

## 2020-04-21 DIAGNOSIS — L89212 Pressure ulcer of right hip, stage 2: Secondary | ICD-10-CM | POA: Diagnosis not present

## 2020-04-21 NOTE — TOC Progression Note (Signed)
Transition of Care [facility]) - Progression Note    Patient Details  Name: Sherry Hicks MRN: [id number] Date of Birth: April 13, 1960  [facility] CM/SW Contact  Janae Bridgeman, RN Phone Number: 04/21/2020, 11:02 AM  Clinical Narrative:    Case management spoke with Phineas Semen, CM at [facility] on the phone and the facility has bed availability for long term care at this time and are willing to view the patient's recent clinicals for possible admission.  Clinicals to be emailed to [employer], CM this morning.  CM and MSW to continue to follow for SNF placement.   Expected Discharge Plan: Skilled Nursing Facility Barriers to Discharge: Continued Medical Work up,Barriers Unresolved (comment),SNF Pending bed offer  Expected Discharge Plan and Services Expected Discharge Plan: Skilled Nursing Facility In-house Referral: Clinical Social Work Discharge Planning Services: CM Consult Post Acute Care Choice: Skilled Nursing Facility Living arrangements for the past 2 months: Apartment                                       Social Determinants of Health (SDOH) Interventions    Readmission Risk Interventions Readmission Risk Prevention Plan 04/06/2020  Post Dischage Appt Complete  Medication Screening Complete  Transportation Screening Complete  Some recent data might be hidden

## 2020-04-21 NOTE — Progress Notes (Signed)
CSW spoke with Lynnea Ferrier at [facility] who states DON is unable to offer this patient a bed at this time.  Edwin Dada, MSW, LCSW Transitions of Care  Clinical Social Worker II [phone]

## 2020-04-21 NOTE — Progress Notes (Signed)
[facility] PROGRESS NOTE  MERIAN WROE WGN:[id number] DOB: 11-17-1960 DOA: 01/27/2020 PCP: [name], DO       03/29/2020                        04/12/2020   Status: The patient will require care spanning > 2 midnights and should be moved to inpatient because: Altered mental status and Unsafe d/c plan -difficult to place secondary to need for skilled nursing facility secondary to severe dementia.  Has required frequent medication adjustments to manage dementia related behaviors  Dispo:               The patient is from: Home              Anticipated d/c is to: SNF              Anticipated d/c date is: > 3 days              Barriers to discharge: Still awaiting SNF bed offer-behavioral disturbances secondary to severe dementia are better controlled with appropriate medications and diversions (baby doll).  Clinicals have been faxed out to multiple facilities but still no firm bed offers as of 3/8   Difficult to place patient Yes  Level of care: Med-Surg  Code Status: Full Family Communication:  DVT prophylaxis: Ambulatory and low risk for DVT Vaccination status: Discussed with daughter [name].  She states on the whole she does not believe in vaccinations but understands that if the facility requires her mother to be vaccinated prior to injury she will allow her mother to receive a vaccination.  I did let her know that some facilities require full vaccination and that with the mRNA vaccines to be fully vaccinated takes up to 6 weeks with a 2-week waiting period after the second vaccine given.  Stated that she preferred the Pfizer vaccine and did not want her mother to have the Janssen vaccine  Foley catheter: No  HPI: 60 y.o.Fwith Alzheimer's dementia with behavioral disturbance who presented with progressively erratic behavior over months.  See longer summary from H&P on 2/9, but in brief, patient had been living in [place] for years until family were informed by  neighbors that patient was wandering, appeared unbathed, and she moved to [facility]. Here, she lived with daughter for about 2 years, but in the last few months, she started to behave more impulsively, to at times seem aggressive and have verbal outbursts, severe insomnia, and so APS recommended the patient be brought to the ER.  In the ER, the patient was seen by Psychiatry and started on new Seroquel. She was held pending geri-psych placement, which was delayed for several months.  Daytime behaviors improved on high dose Seroquel but continues with sundowning behaviors. Added Celexa as mood stabilizer along with Tegretol on 2/14.  Behaviors and nocturnal sleep also improved with the addition of scheduled twice daily Ativan.  Of note patient much more calm after her daughter brought up multiple items from home including a baby doll which the patient "cares for" regularly.   Subjective: Patient awake.  Somewhat restless this morning.  Nursing staff came into room and began interacting with patient and her restlessness improved.   Objective: Vitals:   04/20/20 2203 04/21/20 0556  BP: 112/75 (!) 135/93  Pulse: 80 73  Resp: 14 16  Temp: 98.7 F (37.1 C) (!) 97.1 F (36.2 C)  SpO2: 99% 100%    Intake/Output Summary (Last 24 hours)  at 04/21/2020 0824 Last data filed at 04/21/2020 0600 Gross per 24 hour  Intake 595 ml  Output 0 ml  Net 595 ml   Filed Weights   04/11/20 0300 04/16/20 0400 04/20/20 2203  Weight: 55.7 kg 57.7 kg 57.5 kg    Exam:  Constitutional: Awake, slightly restless but improved after interaction with nursing staff, no acute distress Respiratory: Clear to auscultation she is stable on room air Cardiovascular: Normal heart sounds, skin warm and dry, pulse regular Abdomen: Soft nontender.  Variable oral intake.  Prefers sweets.  LBM 3/7 Neurologic: CN 2-12 grossly intact.  All extremities x4 without any focal neurological deficits.  Gait and ambulation not  assessed. Psychiatric: Awake, oriented times name only.     Assessment/Plan: Acute problems: Dementia with behavioral disturbance Behaviors seem improved without behaviors that would require IM Geodon, Haldol or Seroquel prn Continue high-dose Seroquel-QTC checked on 2/14 less than 500 and no additional dose changes have been made; 3/3 Qtc 420 ms Continue Seroquel PO prn for breakthrough behaviors Celexa (mood stabilizer) increased to 20 mg/day on 2/21 Continue Tegretol 400 mg 8 am and 2 pm and 400 mg 6 pm. 3/2 LFTs repeated and are within normal limits Vistaril without any significant impact on behavior so was discontinued in favor of Ativan Continue twice daily Ativan scheduled Continue gabapentin 2/14 reevaluated by PT and given her status of custodial care not appropriate for acute PT.  Was also evaluated by OT and patient did not have comprehension to participate with OT sessions and was actually actively hallucinating talking on the phone with her cousin therefore no acute OT services indicated as well.  Pressure injury Right anterior hip/Stage 2 -Continue air mattress -Maximize nutrition (see below) and encourage mobility (OOB to chair/ambulate TID) Pressure Injury 04/01/20 Hip Anterior;Right Stage 2 -  Partial thickness loss of dermis presenting as a shallow open injury with a red, pink wound bed without slough. (Active)  Date First Assessed/Time First Assessed: 04/01/20 2230   Location: Hip  Location Orientation: Anterior;Right  Staging: Stage 2 -  Partial thickness loss of dermis presenting as a shallow open injury with a red, pink wound bed without slough.  Present ...    Assessments 04/01/2020 10:05 PM 04/20/2020  8:20 AM  Dressing Type Foam - Lift dressing to assess site every shift Foam - Lift dressing to assess site every shift  Dressing Clean;Dry;Intact --  Site / Wound Assessment Pink --  Drainage Amount None --     No Linked orders to display   Urinary  incontinence -Pericare and other personal care difficult to deliver to patient given her underlying dementia and fear of strangers providing such care -We will try to utilize mesh panties with peripads to minimize degree of personal-care to be given i.e. need to change clothes or change bed linens from significant incontinence of urine and or stool  Other problems: Moderate to severe protein calorie malnutrition -Continuefeeding supplements -Give ice cream or Magic cup twice daily -Albumin 2.9 on 2/14 Estimated body mass index is 20.46 kg/m as calculated from the following:   Height as of this encounter: 5\' 6"  (1.676 m).   Weight as of this encounter: 57.5 kg.  Transaminitis -Repeat LFTs ordered for 2/14 within normal limits and repeated again on 3/2 and remain within normal limits   Data Reviewed: Basic Metabolic Panel: Recent Labs  Lab 04/17/20 0448  NA 138  K 4.2  CL 101  CO2 30  GLUCOSE 87  BUN 18  CREATININE 0.79  CALCIUM 9.2   Liver Function Tests: Recent Labs  Lab 04/17/20 0448  AST 22  ALT 23  ALKPHOS 69  BILITOT 0.5  PROT 5.6*  ALBUMIN 3.1*   No results for input(s): LIPASE, AMYLASE in the last 168 hours. No results for input(s): AMMONIA in the last 168 hours. CBC: Recent Labs  Lab 04/17/20 0448  WBC 4.6  HGB 11.4*  HCT 35.4*  MCV 98.3  PLT 264   Cardiac Enzymes: No results for input(s): CKTOTAL, CKMB, CKMBINDEX, TROPONINI in the last 168 hours. BNP (last 3 results) No results for input(s): BNP in the last 8760 hours.  ProBNP (last 3 results) No results for input(s): PROBNP in the last 8760 hours.  CBG: No results for input(s): GLUCAP in the last 168 hours.  No results found for this or any previous visit (from the past 240 hour(s)).   Studies: No results found.  Scheduled Meds: . carbamazepine  400 mg Oral BID   And  . carbamazepine  400 mg Oral QPM  . citalopram  20 mg Oral Daily  . feeding supplement  237 mL Oral BID BM  .  gabapentin  100 mg Oral BID  . LORazepam  0.5 mg Oral BID  . QUEtiapine  100 mg Oral BID  . QUEtiapine  150 mg Oral QHS   Continuous Infusions:  Principal Problem:   Dementia with behavioral disturbance (HCC) Active Problems:   Major depressive disorder, recurrent episode (HCC)   History of asthma, mild, intermittent   Early Onset Dementia with behavioral problem (HCC)   Protein-calorie malnutrition, moderate (HCC)   Transaminitis   Pressure injury of skin   Alzheimer disease (HCC)   Pressure ulcer of right hip, stage 2 (HCC)   Consultants:  Psychiatry  Procedures:  None  Antibiotics: Anti-infectives (From admission, onward)   None       Time spent: 20 minutes    Junious Silk ANP  [facility] 7 am - 330 pm/M-F for direct patient care and secure chat Please refer to Amion for contact info 84  days

## 2020-04-21 NOTE — Plan of Care (Signed)
  Problem: Safety: Goal: Ability to remain free from injury will improve Outcome: Progressing   

## 2020-04-22 DIAGNOSIS — L89212 Pressure ulcer of right hip, stage 2: Secondary | ICD-10-CM | POA: Diagnosis not present

## 2020-04-22 DIAGNOSIS — F0391 Unspecified dementia with behavioral disturbance: Secondary | ICD-10-CM | POA: Diagnosis not present

## 2020-04-22 DIAGNOSIS — E440 Moderate protein-calorie malnutrition: Secondary | ICD-10-CM | POA: Diagnosis not present

## 2020-04-22 NOTE — Progress Notes (Signed)
[facility] PROGRESS NOTE  Sherry Hicks VOJ:[id number] DOB: 01-07-1961 DOA: 01/27/2020 PCP: [name], DO       03/29/2020                        04/12/2020   Status: The patient will require care spanning > 2 midnights and should be moved to inpatient because: Altered mental status and Unsafe d/c plan -difficult to place secondary to need for skilled nursing facility secondary to severe dementia.  Has required frequent medication adjustments to manage dementia related behaviors  Dispo:               The patient is from: Home              Anticipated d/c is to: SNF              Anticipated d/c date is: > 3 days              Barriers to discharge: Still awaiting SNF bed offer-behavioral disturbances secondary to severe dementia are better controlled with appropriate medications and diversions (baby doll).  Clinicals have been faxed out to multiple facilities but still no firm bed offers as of 3/8   Difficult to place patient Yes  Level of care: Med-Surg  Code Status: Full Family Communication:  DVT prophylaxis: Ambulatory and low risk for DVT Vaccination status: Discussed with daughter [name].  She states on the whole she does not believe in vaccinations but understands that if the facility requires her mother to be vaccinated prior to injury she will allow her mother to receive a vaccination.  I did let her know that some facilities require full vaccination and that with the mRNA vaccines to be fully vaccinated takes up to 6 weeks with a 2-week waiting period after the second vaccine given.  Stated that she preferred the Pfizer vaccine and did not want her mother to have the Janssen vaccine  Foley catheter: No  HPI: 60 y.o.Fwith Alzheimer's dementia with behavioral disturbance who presented with progressively erratic behavior over months.  See longer summary from H&P on 2/9, but in brief, patient had been living in [place] for years until family were informed by  neighbors that patient was wandering, appeared unbathed, and she moved to [facility]. Here, she lived with daughter for about 2 years, but in the last few months, she started to behave more impulsively, to at times seem aggressive and have verbal outbursts, severe insomnia, and so APS recommended the patient be brought to the ER.  In the ER, the patient was seen by Psychiatry and started on new Seroquel. She was held pending geri-psych placement, which was delayed for several months.  Daytime behaviors improved on high dose Seroquel but continues with sundowning behaviors. Added Celexa as mood stabilizer along with Tegretol on 2/14.  Behaviors and nocturnal sleep also improved with the addition of scheduled twice daily Ativan.  Of note patient much more calm after her daughter brought up multiple items from home including a baby doll which the patient "cares for" regularly.   Subjective: Sleeping.  Awakened confused but very pleasant.  Engaging in rambling conversation.   Objective: Vitals:   04/21/20 0556 04/22/20 0600  BP: (!) 135/93 120/67  Pulse: 73 64  Resp: 16 16  Temp: (!) 97.1 F (36.2 C) 97.6 F (36.4 C)  SpO2: 100% 100%    Intake/Output Summary (Last 24 hours) at 04/22/2020 9937 Last data filed at 04/22/2020 0600 Gross  per 24 hour  Intake 1077 ml  Output 0 ml  Net 1077 ml   Filed Weights   04/11/20 0300 04/16/20 0400 04/20/20 2203  Weight: 55.7 kg 57.7 kg 57.5 kg    Exam:  Constitutional: Alert, calm, no acute distress Respiratory: Anterior lung sounds are clear and she is stable on room air Cardiovascular: Normal heart sounds, no peripheral edema, extremities warm to touch, pulse regular Abdomen: Soft and nontender.  Bowel sounds present.  Continues with variable oral intake and prefers sweets most of the time.  LBM 3/7 Neurologic: CN 2-12 grossly intact.  All extremities x4 without any focal neurological deficits.  Gait and ambulation not assessed. Psychiatric: Alert,  oriented times name only.  Rambling speech..     Assessment/Plan: Acute problems: Dementia with behavioral disturbance Behaviors seem improved without behaviors that would require IM Geodon, Haldol or Seroquel prn Continue high-dose Seroquel-QTC checked on 2/14 less than 500 and no additional dose changes have been made; 3/3 Qtc 420 ms Continue Seroquel PO prn for breakthrough behaviors Celexa (mood stabilizer) increased to 20 mg/day on 2/21 Continue Tegretol 400 mg 8 am and 2 pm and 400 mg 6 pm. 3/2 LFTs repeated and are within normal limits Vistaril without any significant impact on behavior so was discontinued in favor of Ativan Continue twice daily Ativan scheduled Continue gabapentin 2/14 reevaluated by PT and given her status of custodial care not appropriate for acute PT.  Was also evaluated by OT and patient did not have comprehension to participate with OT sessions and was actually actively hallucinating talking on the phone with her cousin therefore no acute OT services indicated as well.  Pressure injury Right anterior hip/Stage 2 -Continue air mattress -Maximize nutrition (see below) and encourage mobility (OOB to chair/ambulate TID) Pressure Injury 04/01/20 Hip Anterior;Right Stage 2 -  Partial thickness loss of dermis presenting as a shallow open injury with a red, pink wound bed without slough. (Active)  Date First Assessed/Time First Assessed: 04/01/20 2230   Location: Hip  Location Orientation: Anterior;Right  Staging: Stage 2 -  Partial thickness loss of dermis presenting as a shallow open injury with a red, pink wound bed without slough.  Present ...    Assessments 04/01/2020 10:05 PM 04/22/2020  8:21 AM  Dressing Type Foam - Lift dressing to assess site every shift Foam - Lift dressing to assess site every shift  Dressing Clean;Dry;Intact -  Site / Wound Assessment Pink -  Drainage Amount None -     No Linked orders to display   Urinary incontinence -Pericare and  other personal care difficult to deliver to patient given her underlying dementia and fear of strangers providing such care -We will try to utilize mesh panties with peripads to minimize degree of personal-care to be given i.e. need to change clothes or change bed linens from significant incontinence of urine and or stool  Other problems: Moderate to severe protein calorie malnutrition -Continuefeeding supplements -Give ice cream or Magic cup twice daily -Albumin 2.9 on 2/14 Estimated body mass index is 20.46 kg/m as calculated from the following:   Height as of this encounter: 5\' 6"  (1.676 m).   Weight as of this encounter: 57.5 kg.  Transaminitis -Repeat LFTs ordered for 2/14 within normal limits and repeated again on 3/2 and remain within normal limits   Data Reviewed: Basic Metabolic Panel: Recent Labs  Lab 04/17/20 0448  NA 138  K 4.2  CL 101  CO2 30  GLUCOSE 87  BUN  18  CREATININE 0.79  CALCIUM 9.2   Liver Function Tests: Recent Labs  Lab 04/17/20 0448  AST 22  ALT 23  ALKPHOS 69  BILITOT 0.5  PROT 5.6*  ALBUMIN 3.1*   No results for input(s): LIPASE, AMYLASE in the last 168 hours. No results for input(s): AMMONIA in the last 168 hours. CBC: Recent Labs  Lab 04/17/20 0448  WBC 4.6  HGB 11.4*  HCT 35.4*  MCV 98.3  PLT 264   Cardiac Enzymes: No results for input(s): CKTOTAL, CKMB, CKMBINDEX, TROPONINI in the last 168 hours. BNP (last 3 results) No results for input(s): BNP in the last 8760 hours.  ProBNP (last 3 results) No results for input(s): PROBNP in the last 8760 hours.  CBG: No results for input(s): GLUCAP in the last 168 hours.  No results found for this or any previous visit (from the past 240 hour(s)).   Studies: No results found.  Scheduled Meds: . carbamazepine  400 mg Oral BID   And  . carbamazepine  400 mg Oral QPM  . citalopram  20 mg Oral Daily  . feeding supplement  237 mL Oral BID BM  . gabapentin  100 mg Oral BID  .  LORazepam  0.5 mg Oral BID  . QUEtiapine  100 mg Oral BID  . QUEtiapine  150 mg Oral QHS   Continuous Infusions:  Principal Problem:   Dementia with behavioral disturbance (HCC) Active Problems:   Major depressive disorder, recurrent episode (HCC)   History of asthma, mild, intermittent   Early Onset Dementia with behavioral problem (HCC)   Protein-calorie malnutrition, moderate (HCC)   Transaminitis   Pressure injury of skin   Alzheimer disease (HCC)   Pressure ulcer of right hip, stage 2 (HCC)   Consultants:  Psychiatry  Procedures:  None  Antibiotics: Anti-infectives (From admission, onward)   None       Time spent: 20 minutes    Junious Silk ANP  [facility] 7 am - 330 pm/M-F for direct patient care and secure chat Please refer to Amion for contact info 84  days

## 2020-04-22 NOTE — TOC Progression Note (Signed)
Transition of Care [facility]) - Progression Note    Patient Details  Name: DAYONA SHAHEEN MRN: [id number] Date of Birth: 12-Sep-1960  Transition [facility]) CM/SW [place], RN Phone Number: 04/22/2020, 12:47 PM  Clinical Narrative:    Case  management met at the bedside regarding patient's transitions of care - No SNF bed offers at this time.  I spoke with [facility] in [place], [place] this morning and they are unable to offer an admission bed for this patient.  I also spoke with Caren Griffins, CM at [facility] and they are now accepting new admissions and have availability to a locked memory care unit.  I faxed clinicals to Caren Griffins, Scraper at [facility] to [phone] for possible admission opportunity.  Once the patient is able to receive a bed offer, the patient's daughter agreed to have the patient vaccinated for COVID.  Case management will continue to follow for possible SNF admission once bed offer is available.   Expected Discharge Plan: [place] Barriers to Discharge: Continued Medical Work up,Barriers Unresolved (comment),SNF Pending bed offer  Expected Discharge Plan and Services Expected Discharge Plan: [place] In-house Referral: Clinical Social Work Discharge Planning Services: CM Consult Post Acute Care Choice: [place] Living arrangements for the past 2 months: Apartment                                       Social Determinants of Health (SDOH) Interventions    Readmission Risk Interventions Readmission Risk Prevention Plan 04/06/2020  Post Dischage Appt Complete  Medication Screening Complete  Transportation Screening Complete  Some recent data might be hidden

## 2020-04-23 DIAGNOSIS — F0391 Unspecified dementia with behavioral disturbance: Secondary | ICD-10-CM | POA: Diagnosis not present

## 2020-04-23 NOTE — TOC Progression Note (Signed)
Transition of Care [facility]) - Progression Note    Patient Details  Name: Sherry Hicks MRN: [id number] Date of Birth: [birth year]  Transition of Care [facility]) CM/SW Contact  Janae Bridgeman, RN Phone Number: 04/23/2020, 10:52 AM  Clinical Narrative:    CM spoke with Aram Beecham, CM of [facility] on the phone and she states that the corporate office is reviewing the patient's clinicals at this time for possible admission / bed offer.  No bed offers are available at this time.  The patient will need a COVID vaccine once a bed offer is available and the daughter is in agreement.  CM will continue to follow the patient for SNF admission.   Expected Discharge Plan: Skilled Nursing Facility Barriers to Discharge: Continued Medical Work up,Barriers Unresolved (comment),SNF Pending bed offer  Expected Discharge Plan and Services Expected Discharge Plan: Skilled Nursing Facility In-house Referral: Clinical Social Work Discharge Planning Services: CM Consult Post Acute Care Choice: Skilled Nursing Facility Living arrangements for the past 2 months: Apartment                                       Social Determinants of Health (SDOH) Interventions    Readmission Risk Interventions Readmission Risk Prevention Plan 04/06/2020  Post Dischage Appt Complete  Medication Screening Complete  Transportation Screening Complete  Some recent data might be hidden

## 2020-04-23 NOTE — Progress Notes (Signed)
[facility] PROGRESS NOTE  Sherry Hicks TMH:[id number] DOB: October 17, 1960 DOA: 01/27/2020 PCP: Sherry Jumbo, DO       03/29/2020                        04/12/2020   Status: The patient will require care spanning > 2 midnights and should be moved to inpatient because: Altered mental status and Unsafe d/c plan -difficult to place secondary to need for skilled nursing facility secondary to severe dementia.  Has required frequent medication adjustments to manage dementia related behaviors  Dispo:               The patient is from: Home              Anticipated d/c is to: SNF              Anticipated d/c date is: > 3 days              Barriers to discharge: Still awaiting SNF bed offer-behavioral disturbances secondary to severe dementia are better controlled with appropriate medications and diversions (baby doll).  Clinicals have been faxed out to multiple facilities but still no firm bed offers as of 3/8   Difficult to place patient Yes  Level of care: Med-Surg  Code Status: Full Family Communication:  DVT prophylaxis: Ambulatory and low risk for DVT Vaccination status: Discussed with daughter Sherry Hicks.  She states on the whole she does not believe in vaccinations but understands that if the facility requires her mother to be vaccinated prior to injury she will allow her mother to receive a vaccination.  I did let her know that some facilities require full vaccination and that with the mRNA vaccines to be fully vaccinated takes up to 6 weeks with a 2-week waiting period after the second vaccine given.  Stated that she preferred the Pfizer vaccine and did not want her mother to have the Janssen vaccine  Foley catheter: No  HPI: 60 y.o.Fwith Alzheimer's dementia with behavioral disturbance who presented with progressively erratic behavior over months.  See longer summary from H&P on 2/9, but in brief, patient had been living in [place] for years until family were informed by  neighbors that patient was wandering, appeared unbathed, and she moved to [facility]. Here, she lived with daughter for about 2 years, but in the last few months, she started to behave more impulsively, to at times seem aggressive and have verbal outbursts, severe insomnia, and so APS recommended the patient be brought to the ER.  In the ER, the patient was seen by Psychiatry and started on new Seroquel. She was held pending geri-psych placement, which was delayed for several months.  Daytime behaviors improved on high dose Seroquel but continues with sundowning behaviors. Added Celexa as mood stabilizer along with Tegretol on 2/14.  Behaviors and nocturnal sleep also improved with the addition of scheduled twice daily Ativan.  Of note patient much more calm after her daughter brought up multiple items from home including a baby doll which the patient "cares for" regularly.   Subjective: Sleeping.  Did not awaken during physical exam.   Objective: Vitals:   04/21/20 0556 04/22/20 0600  BP: (!) 135/93 120/67  Pulse: 73 64  Resp: 16 16  Temp: (!) 97.1 F (36.2 C) 97.6 F (36.4 C)  SpO2: 100% 100%    Intake/Output Summary (Last 24 hours) at 04/23/2020 9211 Last data filed at 04/22/2020 1900 Gross per 24 hour  Intake 780 ml  Output --  Net 780 ml   Filed Weights   04/11/20 0300 04/16/20 0400 04/20/20 2203  Weight: 55.7 kg 57.7 kg 57.5 kg    Exam:  Constitutional: Asleep and calm Respiratory: Anterior lung sounds are clear to auscultation.  She remained stable on room air Cardiovascular: Heart sounds are normal.  She remains normotensive.  No peripheral edema Abdomen: Soft nontender with variable oral intake with preference for sweets.  LBM 3/10 Neurologic: CN 2-12 grossly intact.  All extremities x4 without any focal neurological deficits.  Gait and ambulation not assessed. Psychiatric: Asleep but at baseline is oriented to name only.  Has extreme short-term memory deficits and at  times has rambling communication.     Assessment/Plan: Acute problems: Dementia with behavioral disturbance Behaviors seem improved without behaviors that would require IM Geodon, Haldol or Seroquel prn Continue high-dose Seroquel-QTC checked on 2/14 less than 500 and no additional dose changes have been made; 3/3 Qtc 420 ms Continue Seroquel PO prn for breakthrough behaviors Celexa (mood stabilizer) increased to 20 mg/day on 2/21 Continue Tegretol 400 mg 8 am and 2 pm and 400 mg 6 pm. 3/2 LFTs repeated and are within normal limits Vistaril without any significant impact on behavior so was discontinued in favor of Ativan Continue twice daily Ativan scheduled Continue gabapentin 2/14 reevaluated by PT and given her status of custodial care not appropriate for acute PT.  Was also evaluated by OT and patient did not have comprehension to participate with OT sessions and was actually actively hallucinating talking on the phone with her cousin therefore no acute OT services indicated as well.  Pressure injury Right anterior hip/Stage 2 -Continue air mattress -Maximize nutrition (see below) and encourage mobility (OOB to chair/ambulate TID) Pressure Injury 04/01/20 Hip Anterior;Right Stage 2 -  Partial thickness loss of dermis presenting as a shallow open injury with a red, pink wound bed without slough. (Active)  Date First Assessed/Time First Assessed: 04/01/20 2230   Location: Hip  Location Orientation: Anterior;Right  Staging: Stage 2 -  Partial thickness loss of dermis presenting as a shallow open injury with a red, pink wound bed without slough.  Present ...    Assessments 04/01/2020 10:05 PM 04/22/2020  8:33 PM  Dressing Type Foam - Lift dressing to assess site every shift Foam - Lift dressing to assess site every shift  Dressing Clean;Dry;Intact Clean;Dry;Intact  Site / Wound Assessment Pink --  Drainage Amount None --     No Linked orders to display   Urinary incontinence -Pericare  and other personal care difficult to deliver to patient given her underlying dementia and fear of strangers providing such care -We will try to utilize mesh panties with peripads to minimize degree of personal-care to be given i.e. need to change clothes or change bed linens from significant incontinence of urine and or stool  Other problems: Moderate to severe protein calorie malnutrition -Continuefeeding supplements -Give ice cream or Magic cup twice daily -Albumin 2.9 on 2/14 Estimated body mass index is 20.46 kg/m as calculated from the following:   Height as of this encounter: 5\' 6"  (1.676 m).   Weight as of this encounter: 57.5 kg.  Transaminitis -Repeat LFTs ordered for 2/14 within normal limits and repeated again on 3/2 and remain within normal limits   Data Reviewed: Basic Metabolic Panel: Recent Labs  Lab 04/17/20 0448  NA 138  K 4.2  CL 101  CO2 30  GLUCOSE 87  BUN 18  CREATININE 0.79  CALCIUM 9.2   Liver Function Tests: Recent Labs  Lab 04/17/20 0448  AST 22  ALT 23  ALKPHOS 69  BILITOT 0.5  PROT 5.6*  ALBUMIN 3.1*   No results for input(s): LIPASE, AMYLASE in the last 168 hours. No results for input(s): AMMONIA in the last 168 hours. CBC: Recent Labs  Lab 04/17/20 0448  WBC 4.6  HGB 11.4*  HCT 35.4*  MCV 98.3  PLT 264   Cardiac Enzymes: No results for input(s): CKTOTAL, CKMB, CKMBINDEX, TROPONINI in the last 168 hours. BNP (last 3 results) No results for input(s): BNP in the last 8760 hours.  ProBNP (last 3 results) No results for input(s): PROBNP in the last 8760 hours.  CBG: No results for input(s): GLUCAP in the last 168 hours.  No results found for this or any previous visit (from the past 240 hour(s)).   Studies: No results found.  Scheduled Meds:  carbamazepine  400 mg Oral BID   And   carbamazepine  400 mg Oral QPM   citalopram  20 mg Oral Daily   feeding supplement  237 mL Oral BID BM   gabapentin  100 mg Oral BID    LORazepam  0.5 mg Oral BID   QUEtiapine  100 mg Oral BID   QUEtiapine  150 mg Oral QHS   Continuous Infusions:  Principal Problem:   Dementia with behavioral disturbance (HCC) Active Problems:   Major depressive disorder, recurrent episode (HCC)   History of asthma, mild, intermittent   Early Onset Dementia with behavioral problem (HCC)   Protein-calorie malnutrition, moderate (HCC)   Transaminitis   Pressure injury of skin   Alzheimer disease (HCC)   Pressure ulcer of right hip, stage 2 (HCC)   Consultants:  Psychiatry  Procedures:  None  Antibiotics: Anti-infectives (From admission, onward)   None       Time spent: 20 minutes    Junious Silk ANP  [facility] 7 am - 330 pm/M-F for direct patient care and secure chat Please refer to Amion for contact info 84  days

## 2020-04-23 NOTE — Plan of Care (Signed)
  Problem: Elimination: Goal: Will not experience complications related to bowel motility Outcome: Progressing Goal: Will not experience complications related to urinary retention Outcome: Progressing   

## 2020-04-24 DIAGNOSIS — F0391 Unspecified dementia with behavioral disturbance: Secondary | ICD-10-CM | POA: Diagnosis not present

## 2020-04-24 NOTE — Progress Notes (Signed)
[facility] PROGRESS NOTE  Sherry Hicks SAY:[id number] DOB: 1960-10-12 DOA: 01/27/2020 PCP: Sherry Jumbo, DO       03/29/2020                        04/12/2020   Status: The patient will require care spanning > 2 midnights and should be moved to inpatient because: Altered mental status and Unsafe d/c plan -difficult to place secondary to need for skilled nursing facility secondary to severe dementia.  Has required frequent medication adjustments to manage dementia related behaviors  Dispo:               The patient is from: Home              Anticipated d/c is to: SNF              Anticipated d/c date is: > 3 days              Barriers to discharge: Still awaiting SNF bed offer-behavioral disturbances secondary to severe dementia are better controlled with appropriate medications and diversions (baby doll).  Clinicals have been faxed out to multiple facilities but still no firm bed offers as of 3/8   Difficult to place patient Yes  Level of care: Med-Surg  Code Status: Full Family Communication:  DVT prophylaxis: Ambulatory and low risk for DVT Vaccination status: Discussed with daughter Sherry Hicks.  She states on the whole she does not believe in vaccinations but understands that if the facility requires her mother to be vaccinated prior to injury she will allow her mother to receive a vaccination.  I did let her know that some facilities require full vaccination and that with the mRNA vaccines to be fully vaccinated takes up to 6 weeks with a 2-week waiting period after the second vaccine given.  Stated that she preferred the Pfizer vaccine and did not want her mother to have the Janssen vaccine  Foley catheter: No  HPI: 59 y.o.Fwith Alzheimer's dementia with behavioral disturbance who presented with progressively erratic behavior over months.  See longer summary from H&P on 2/9, but in brief, patient had been living in [place] for years until family were informed by  neighbors that patient was wandering, appeared unbathed, and she moved to [facility]. Here, she lived with daughter for about 2 years, but in the last few months, she started to behave more impulsively, to at times seem aggressive and have verbal outbursts, severe insomnia, and so APS recommended the patient be brought to the ER.  In the ER, the patient was seen by Psychiatry and started on new Seroquel. She was held pending geri-psych placement, which was delayed for several months.  Daytime behaviors improved on high dose Seroquel but continues with sundowning behaviors. Added Celexa as mood stabilizer along with Tegretol on 2/14.  Behaviors and nocturnal sleep also improved with the addition of scheduled twice daily Ativan.  Of note patient much more calm after her daughter brought up multiple items from home including a baby doll which the patient "cares for" regularly.   Subjective: Seen at her bedside.  Sleeping, not in acute distress.  Vital signs and labs reviewed and are stable.  Awaiting placement.   Objective: Vitals:   04/23/20 1830 04/24/20 1206  BP: 112/66 (!) 167/94  Pulse: 75 89  Resp: 18 16  Temp: 97.7 F (36.5 C)   SpO2: 96% 100%    Intake/Output Summary (Last 24 hours) at 04/24/2020 1459 Last  data filed at 04/24/2020 0849 Gross per 24 hour  Intake 837 ml  Output 0 ml  Net 837 ml   Filed Weights   04/11/20 0300 04/16/20 0400 04/20/20 2203  Weight: 55.7 kg 57.7 kg 57.5 kg    Exam:  Constitutional: Asleep and calm Respiratory: Anterior lung sounds are clear to auscultation.  She remained stable on room air Cardiovascular: Heart sounds are normal.  She remains normotensive.  No peripheral edema Abdomen: Soft nontender with variable oral intake with preference for sweets.  LBM 3/10 Neurologic: CN 2-12 grossly intact.  All extremities x4 without any focal neurological deficits.  Gait and ambulation not assessed. Psychiatric: Asleep but at baseline is oriented to name  only.  Has extreme short-term memory deficits and at times has rambling communication.     Assessment/Plan: Acute problems: Dementia with behavioral disturbance Behaviors seem improved without behaviors that would require IM Geodon, Haldol or Seroquel prn Continue high-dose Seroquel-QTC checked on 2/14 less than 500 and no additional dose changes have been made; 3/3 Qtc 420 ms Continue Seroquel PO prn for breakthrough behaviors Celexa (mood stabilizer) increased to 20 mg/day on 2/21 Continue Tegretol 400 mg 8 am and 2 pm and 400 mg 6 pm. 3/2 LFTs repeated and are within normal limits Vistaril without any significant impact on behavior so was discontinued in favor of Ativan Continue twice daily Ativan scheduled Continue gabapentin 2/14 reevaluated by PT and given her status of custodial care not appropriate for acute PT.  Was also evaluated by OT and patient did not have comprehension to participate with OT sessions and was actually actively hallucinating talking on the phone with her cousin therefore no acute OT services indicated as well.  Pressure injury Right anterior hip/Stage 2 -Continue air mattress -Maximize nutrition (see below) and encourage mobility (OOB to chair/ambulate TID) Pressure Injury 04/01/20 Hip Anterior;Right Stage 2 -  Partial thickness loss of dermis presenting as a shallow open injury with a red, pink wound bed without slough. (Active)  Date First Assessed/Time First Assessed: 04/01/20 2230   Location: Hip  Location Orientation: Anterior;Right  Staging: Stage 2 -  Partial thickness loss of dermis presenting as a shallow open injury with a red, pink wound bed without slough.  Present ...    Assessments 04/01/2020 10:05 PM 04/24/2020  8:00 AM  Dressing Type Foam - Lift dressing to assess site every shift Foam - Lift dressing to assess site every shift  Dressing Clean;Dry;Intact Clean;Dry;Intact  State of Healing -- Eschar  Site / Wound Assessment Pink Dressing in place  / Unable to assess  Peri-wound Assessment -- Intact  Margins -- Attached edges (approximated)  Drainage Amount None --  Treatment -- Other (Comment)     No Linked orders to display   Urinary incontinence -Pericare and other personal care difficult to deliver to patient given her underlying dementia and fear of strangers providing such care -We will try to utilize mesh panties with peripads to minimize degree of personal-care to be given i.e. need to change clothes or change bed linens from significant incontinence of urine and or stool  Other problems: Moderate to severe protein calorie malnutrition -Continuefeeding supplements -Give ice cream or Magic cup twice daily -Albumin 2.9 on 2/14 Estimated body mass index is 20.46 kg/m as calculated from the following:   Height as of this encounter: 5\' 6"  (1.676 m).   Weight as of this encounter: 57.5 kg.  Transaminitis -Repeat LFTs ordered for 2/14 within normal limits and repeated again  on 3/2 and remain within normal limits   Data Reviewed: Basic Metabolic Panel: No results for input(s): NA, K, CL, CO2, GLUCOSE, BUN, CREATININE, CALCIUM, MG, PHOS in the last 168 hours. Liver Function Tests: No results for input(s): AST, ALT, ALKPHOS, BILITOT, PROT, ALBUMIN in the last 168 hours. No results for input(s): LIPASE, AMYLASE in the last 168 hours. No results for input(s): AMMONIA in the last 168 hours. CBC: No results for input(s): WBC, NEUTROABS, HGB, HCT, MCV, PLT in the last 168 hours. Cardiac Enzymes: No results for input(s): CKTOTAL, CKMB, CKMBINDEX, TROPONINI in the last 168 hours. BNP (last 3 results) No results for input(s): BNP in the last 8760 hours.  ProBNP (last 3 results) No results for input(s): PROBNP in the last 8760 hours.  CBG: No results for input(s): GLUCAP in the last 168 hours.  No results found for this or any previous visit (from the past 240 hour(s)).   Studies: No results found.  Scheduled Meds: .  carbamazepine  400 mg Oral BID   And  . carbamazepine  400 mg Oral QPM  . citalopram  20 mg Oral Daily  . feeding supplement  237 mL Oral BID BM  . gabapentin  100 mg Oral BID  . LORazepam  0.5 mg Oral BID  . QUEtiapine  100 mg Oral BID  . QUEtiapine  150 mg Oral QHS   Continuous Infusions:  Principal Problem:   Dementia with behavioral disturbance (HCC) Active Problems:   Major depressive disorder, recurrent episode (HCC)   History of asthma, mild, intermittent   Early Onset Dementia with behavioral problem (HCC)   Protein-calorie malnutrition, moderate (HCC)   Transaminitis   Pressure injury of skin   Alzheimer disease (HCC)   Pressure ulcer of right hip, stage 2 (HCC)   Consultants:  Psychiatry  Procedures:  None  Antibiotics: Anti-infectives (From admission, onward)   None       Time spent: 20 minutes    Darlin Drop ANP  [facility] 7 am - 330 pm/M-F for direct patient care and secure chat Please refer to Amion for contact info 84  days

## 2020-04-25 DIAGNOSIS — F0391 Unspecified dementia with behavioral disturbance: Secondary | ICD-10-CM | POA: Diagnosis not present

## 2020-04-25 NOTE — Progress Notes (Signed)
[facility] PROGRESS NOTE  Sherry Hicks QIO:[id number] DOB: Dec 06, 1960 DOA: 01/27/2020 PCP: [name], DO       03/29/2020                        04/12/2020   Status: The patient will require care spanning > 2 midnights and should be moved to inpatient because: Altered mental status and Unsafe d/c plan -difficult to place secondary to need for skilled nursing facility secondary to severe dementia.  Has required frequent medication adjustments to manage dementia related behaviors  Dispo:               The patient is from: Home              Anticipated d/c is to: SNF              Anticipated d/c date is: > 3 days              Barriers to discharge: Still awaiting SNF bed offer-behavioral disturbances secondary to severe dementia are better controlled with appropriate medications and diversions (baby doll).  Clinicals have been faxed out to multiple facilities but still no firm bed offers as of 3/8   Difficult to place patient Yes  Level of care: Med-Surg  Code Status: Full Family Communication:  DVT prophylaxis: Ambulatory and low risk for DVT Vaccination status: Discussed with daughter [name].  She states on the whole she does not believe in vaccinations but understands that if the facility requires her mother to be vaccinated prior to injury she will allow her mother to receive a vaccination.  I did let her know that some facilities require full vaccination and that with the mRNA vaccines to be fully vaccinated takes up to 6 weeks with a 2-week waiting period after the second vaccine given.  Stated that she preferred the Pfizer vaccine and did not want her mother to have the Janssen vaccine  Foley catheter: No  HPI: 60 y.o.Fwith Alzheimer's dementia with behavioral disturbance who presented with progressively erratic behavior over months.  See longer summary from H&P on 2/9, but in brief, patient had been living in [place] for years until family were informed by  neighbors that patient was wandering, appeared unbathed, and she moved to [facility]. Here, she lived with daughter for about 2 years, but in the last few months, she started to behave more impulsively, to at times seem aggressive and have verbal outbursts, severe insomnia, and so APS recommended the patient be brought to the ER.  In the ER, the patient was seen by Psychiatry and started on new Seroquel. She was held pending geri-psych placement, which was delayed for several months.  Daytime behaviors improved on high dose Seroquel but continues with sundowning behaviors. Added Celexa as mood stabilizer along with Tegretol on 2/14.  Behaviors and nocturnal sleep also improved with the addition of scheduled twice daily Ativan.  Of note patient much more calm after her daughter brought up multiple items from home including a baby doll which the patient "cares for" regularly.   Subjective: Seen at her bedside.  She is sleeping.  She does not appear in distress.   Objective: Vitals:   04/24/20 1206 04/25/20 1102  BP: (!) 167/94 129/74  Pulse: 89 65  Resp: 16 17  Temp:  98.1 F (36.7 C)  SpO2: 100% 99%    Intake/Output Summary (Last 24 hours) at 04/25/2020 1143 Last data filed at 04/25/2020 0601 Gross per  24 hour  Intake 960 ml  Output -  Net 960 ml   Filed Weights   04/11/20 0300 04/16/20 0400 04/20/20 2203  Weight: 55.7 kg 57.7 kg 57.5 kg    Exam: No significant changes from prior exam.  Constitutional: Asleep and calm Respiratory: Anterior lung sounds are clear to auscultation.  She remained stable on room air Cardiovascular: Heart sounds are normal.  She remains normotensive.  No peripheral edema Abdomen: Soft nontender with variable oral intake with preference for sweets.  LBM 3/10 Neurologic: CN 2-12 grossly intact.  All extremities x4 without any focal neurological deficits.  Gait and ambulation not assessed. Psychiatric: Asleep but at baseline is oriented to name only.  Has  extreme short-term memory deficits and at times has rambling communication.     Assessment/Plan: Acute problems: Dementia with behavioral disturbance Behaviors seem improved without behaviors that would require IM Geodon, Haldol or Seroquel prn Continue high-dose Seroquel-QTC checked on 2/14 less than 500 and no additional dose changes have been made; 3/3 Qtc 420 ms Continue Seroquel PO prn for breakthrough behaviors Celexa (mood stabilizer) increased to 20 mg/day on 2/21 Continue Tegretol 400 mg 8 am and 2 pm and 400 mg 6 pm. 3/2 LFTs repeated and are within normal limits Vistaril without any significant impact on behavior so was discontinued in favor of Ativan Continue twice daily Ativan scheduled Continue gabapentin 2/14 reevaluated by PT and given her status of custodial care not appropriate for acute PT.  Was also evaluated by OT and patient did not have comprehension to participate with OT sessions and was actually actively hallucinating talking on the phone with her cousin therefore no acute OT services indicated as well.  Pressure injury Right anterior hip/Stage 2 -Continue air mattress -Maximize nutrition (see below) and encourage mobility (OOB to chair/ambulate TID) Pressure Injury 04/01/20 Hip Anterior;Right Stage 2 -  Partial thickness loss of dermis presenting as a shallow open injury with a red, pink wound bed without slough. (Active)  Date First Assessed/Time First Assessed: 04/01/20 2230   Location: Hip  Location Orientation: Anterior;Right  Staging: Stage 2 -  Partial thickness loss of dermis presenting as a shallow open injury with a red, pink wound bed without slough.  Present ...    Assessments 04/01/2020 10:05 PM 04/24/2020 10:20 PM  Dressing Type Foam - Lift dressing to assess site every shift Foam - Lift dressing to assess site every shift  Dressing Clean;Dry;Intact Clean;Dry;Intact  Site / Wound Assessment Pink -  Drainage Amount None -     No Linked orders to  display   Urinary incontinence -Pericare and other personal care difficult to deliver to patient given her underlying dementia and fear of strangers providing such care -We will try to utilize mesh panties with peripads to minimize degree of personal-care to be given i.e. need to change clothes or change bed linens from significant incontinence of urine and or stool  Other problems: Moderate to severe protein calorie malnutrition -Continuefeeding supplements -Give ice cream or Magic cup twice daily -Albumin 2.9 on 2/14 Estimated body mass index is 20.46 kg/m as calculated from the following:   Height as of this encounter: 5\' 6"  (1.676 m).   Weight as of this encounter: 57.5 kg.  Transaminitis -Repeat LFTs ordered for 2/14 within normal limits and repeated again on 3/2 and remain within normal limits   Data Reviewed: Basic Metabolic Panel: No results for input(s): NA, K, CL, CO2, GLUCOSE, BUN, CREATININE, CALCIUM, MG, PHOS in the last  168 hours. Liver Function Tests: No results for input(s): AST, ALT, ALKPHOS, BILITOT, PROT, ALBUMIN in the last 168 hours. No results for input(s): LIPASE, AMYLASE in the last 168 hours. No results for input(s): AMMONIA in the last 168 hours. CBC: No results for input(s): WBC, NEUTROABS, HGB, HCT, MCV, PLT in the last 168 hours. Cardiac Enzymes: No results for input(s): CKTOTAL, CKMB, CKMBINDEX, TROPONINI in the last 168 hours. BNP (last 3 results) No results for input(s): BNP in the last 8760 hours.  ProBNP (last 3 results) No results for input(s): PROBNP in the last 8760 hours.  CBG: No results for input(s): GLUCAP in the last 168 hours.  No results found for this or any previous visit (from the past 240 hour(s)).   Studies: No results found.  Scheduled Meds: . carbamazepine  400 mg Oral BID   And  . carbamazepine  400 mg Oral QPM  . citalopram  20 mg Oral Daily  . feeding supplement  237 mL Oral BID BM  . gabapentin  100 mg Oral BID   . LORazepam  0.5 mg Oral BID  . QUEtiapine  100 mg Oral BID  . QUEtiapine  150 mg Oral QHS   Continuous Infusions:  Principal Problem:   Dementia with behavioral disturbance (HCC) Active Problems:   Major depressive disorder, recurrent episode (HCC)   History of asthma, mild, intermittent   Early Onset Dementia with behavioral problem (HCC)   Protein-calorie malnutrition, moderate (HCC)   Transaminitis   Pressure injury of skin   Alzheimer disease (HCC)   Pressure ulcer of right hip, stage 2 (HCC)   Consultants:  Psychiatry  Procedures:  None  Antibiotics: Anti-infectives (From admission, onward)   None       Time spent: 20 minutes    Darlin Drop ANP  [facility] 7 am - 330 pm/M-F for direct patient care and secure chat Please refer to Amion for contact info 84  days

## 2020-04-26 DIAGNOSIS — F0391 Unspecified dementia with behavioral disturbance: Secondary | ICD-10-CM | POA: Diagnosis not present

## 2020-04-26 NOTE — Progress Notes (Signed)
[facility] PROGRESS NOTE  Sherry Hicks BOF:[id number] DOB: August 18, 1960 DOA: 01/27/2020 PCP: [name], DO       03/29/2020                        04/12/2020   Status: The patient will require care spanning > 2 midnights and should be moved to inpatient because: Altered mental status and Unsafe d/c plan -difficult to place secondary to need for skilled nursing facility secondary to severe dementia.  Has required frequent medication adjustments to manage dementia related behaviors  Dispo:               The patient is from: Home              Anticipated d/c is to: SNF              Anticipated d/c date is: > 3 days              Barriers to discharge: Still awaiting SNF bed offer-behavioral disturbances secondary to severe dementia are better controlled with appropriate medications and diversions (baby doll).  Clinicals have been faxed out to multiple facilities but still no firm bed offers as of 3/8   Difficult to place patient Yes  Level of care: Med-Surg  Code Status: Full Family Communication:  DVT prophylaxis: Ambulatory and low risk for DVT Vaccination status: Discussed with daughter [name].  She states on the whole she does not believe in vaccinations but understands that if the facility requires her mother to be vaccinated prior to injury she will allow her mother to receive a vaccination.  I did let her know that some facilities require full vaccination and that with the mRNA vaccines to be fully vaccinated takes up to 6 weeks with a 2-week waiting period after the second vaccine given.  Stated that she preferred the Pfizer vaccine and did not want her mother to have the Janssen vaccine  Foley catheter: No  HPI: 60 y.o.Fwith Alzheimer's dementia with behavioral disturbance who presented with progressively erratic behavior over months.  See longer summary from H&P on 2/9, but in brief, patient had been living in [place] for years until family were informed by  neighbors that patient was wandering, appeared unbathed, and she moved to [facility]. Here, she lived with daughter for about 2 years, but in the last few months, she started to behave more impulsively, to at times seem aggressive and have verbal outbursts, severe insomnia, and so APS recommended the patient be brought to the ER.  In the ER, the patient was seen by Psychiatry and started on new Seroquel. She was held pending geri-psych placement, which was delayed for several months.  Daytime behaviors improved on high dose Seroquel but continues with sundowning behaviors. Added Celexa as mood stabilizer along with Tegretol on 2/14.  Behaviors and nocturnal sleep also improved with the addition of scheduled twice daily Ativan.  Of note patient much more calm after her daughter brought up multiple items from home including a baby doll which the patient "cares for" regularly.   Subjective: Sleeping.  Patient awakened and was pleasant.  Once again she remains very confused with rambling speech.   Objective: Vitals:   04/25/20 1102 04/25/20 1700  BP: 129/74 130/75  Pulse: 65 70  Resp: 17 16  Temp: 98.1 F (36.7 C) 98 F (36.7 C)  SpO2: 99% 100%    Intake/Output Summary (Last 24 hours) at 04/26/2020 7782 Last data filed at  04/26/2020 0200 Gross per 24 hour  Intake 480 ml  Output 0 ml  Net 480 ml   Filed Weights   04/11/20 0300 04/16/20 0400 04/20/20 2203  Weight: 55.7 kg 57.7 kg 57.5 kg    Exam:  Constitutional: Awake and alert and in no acute distress Respiratory: Bilateral lung sounds are clear to auscultation anteriorly, she remained stable on room air Cardiovascular: Normal heart sounds, extremities warm to touch, pulses regular and no peripheral edema Abdomen: Soft nontender with normoactive bowel sounds.  Variable oral intake.  Patient had Odomzo consistent with Sherry Hicks on her gown.  LBM 3/10 Neurologic: CN 2-12 grossly intact.  All extremities x4 without any focal  neurological deficits.  Gait and ambulation not assessed. Psychiatric: Awake, remains confused and oriented to name only.  Pleasant today with rambling speech.     Assessment/Plan: Acute problems: Dementia with behavioral disturbance Behaviors seem improved without behaviors that would require IM Geodon, Haldol or Seroquel prn Continue high-dose Seroquel-QTC checked on 2/14 less than 500 and no additional dose changes have been made; 3/3 Qtc 420 ms Continue Seroquel PO prn for breakthrough behaviors Celexa (mood stabilizer) increased to 20 mg/day on 2/21 Continue Tegretol 400 mg 8 am and 2 pm and 400 mg 6 pm. 3/5 LFTs repeated and are within normal limits-we will check LFTs again in a.m. on 3/15. Vistaril without any significant impact on behavior so was discontinued in favor of Ativan Continue twice daily Ativan scheduled Continue gabapentin 2/14 reevaluated by PT and given her status of custodial care not appropriate for acute PT.  Was also evaluated by OT and patient did not have comprehension to participate with OT sessions and was actually actively hallucinating talking on the phone with her cousin therefore no acute OT services indicated as well.  Pressure injury Right anterior hip/Stage 2 -Continue air mattress -Maximize nutrition (see below) and encourage mobility (OOB to chair/ambulate TID) Pressure Injury 04/01/20 Hip Anterior;Right Stage 2 -  Partial thickness loss of dermis presenting as a shallow open injury with a red, pink wound bed without slough. (Active)  Date First Assessed/Time First Assessed: 04/01/20 2230   Location: Hip  Location Orientation: Anterior;Right  Staging: Stage 2 -  Partial thickness loss of dermis presenting as a shallow open injury with a red, pink wound bed without slough.  Present ...    Assessments 04/01/2020 10:05 PM 04/26/2020  9:00 AM  Dressing Type Foam - Lift dressing to assess site every shift Foam - Lift dressing to assess site every shift   Dressing Clean;Dry;Intact --  Site / Wound Assessment Pink Dressing in place / Unable to assess  Drainage Amount None --     No Linked orders to display   Urinary incontinence -Pericare and other personal care difficult to deliver to patient given her underlying dementia and fear of strangers providing such care -We will try to utilize mesh panties with peripads to minimize degree of personal-care to be given i.e. need to change clothes or change bed linens from significant incontinence of urine and or stool  Other problems: Moderate to severe protein calorie malnutrition -Continuefeeding supplements -Give ice cream or Magic cup twice daily -Albumin 2.9 on 2/14 Estimated body mass index is 20.46 kg/m as calculated from the following:   Height as of this encounter: 5\' 6"  (1.676 m).   Weight as of this encounter: 57.5 kg.  Transaminitis -Repeat LFTs ordered for 2/14 within normal limits and repeated again on 3/5 and remain within normal limits  Data Reviewed: Basic Metabolic Panel: No results for input(s): NA, K, CL, CO2, GLUCOSE, BUN, CREATININE, CALCIUM, MG, PHOS in the last 168 hours. Liver Function Tests: No results for input(s): AST, ALT, ALKPHOS, BILITOT, PROT, ALBUMIN in the last 168 hours. No results for input(s): LIPASE, AMYLASE in the last 168 hours. No results for input(s): AMMONIA in the last 168 hours. CBC: No results for input(s): WBC, NEUTROABS, HGB, HCT, MCV, PLT in the last 168 hours. Cardiac Enzymes: No results for input(s): CKTOTAL, CKMB, CKMBINDEX, TROPONINI in the last 168 hours. BNP (last 3 results) No results for input(s): BNP in the last 8760 hours.  ProBNP (last 3 results) No results for input(s): PROBNP in the last 8760 hours.  CBG: No results for input(s): GLUCAP in the last 168 hours.  No results found for this or any previous visit (from the past 240 hour(s)).   Studies: No results found.  Scheduled Meds: . carbamazepine  400 mg Oral  BID   And  . carbamazepine  400 mg Oral QPM  . citalopram  20 mg Oral Daily  . feeding supplement  237 mL Oral BID BM  . gabapentin  100 mg Oral BID  . LORazepam  0.5 mg Oral BID  . QUEtiapine  100 mg Oral BID  . QUEtiapine  150 mg Oral QHS   Continuous Infusions:  Principal Problem:   Dementia with behavioral disturbance (HCC) Active Problems:   Major depressive disorder, recurrent episode (HCC)   History of asthma, mild, intermittent   Early Onset Dementia with behavioral problem (HCC)   Protein-calorie malnutrition, moderate (HCC)   Transaminitis   Pressure injury of skin   Alzheimer disease (HCC)   Pressure ulcer of right hip, stage 2 (HCC)   Consultants:  Psychiatry  Procedures:  None  Antibiotics: Anti-infectives (From admission, onward)   None       Time spent: 20 minutes    Junious Silk ANP  [facility] 7 am - 330 pm/M-F for direct patient care and secure chat Please refer to Amion for contact info 84  days

## 2020-04-26 NOTE — TOC Progression Note (Signed)
Transition of Care [facility]) - Progression Note    Patient Details  Name: [name] MRN: [id number] Date of Birth: September 15, 1960  [facility] CM/SW Contact  Janae Bridgeman, RN Phone Number: 04/26/2020, 11:02 AM  Clinical Narrative:    Case management called and left a message with Cheyenne Adas this morning to inquire about bed availability and possible bed offer from the facility for placement.  Waiting for return phone call concerning placement.  Patient will need COVID vaccine prior to transfer to the facility.   Expected Discharge Plan: Skilled Nursing Facility Barriers to Discharge: Continued Medical Work up,Barriers Unresolved (comment),SNF Pending bed offer  Expected Discharge Plan and Services Expected Discharge Plan: Skilled Nursing Facility In-house Referral: Clinical Social Work Discharge Planning Services: CM Consult Post Acute Care Choice: Skilled Nursing Facility Living arrangements for the past 2 months: Apartment                                       Social Determinants of Health (SDOH) Interventions    Readmission Risk Interventions Readmission Risk Prevention Plan 04/06/2020  Post Dischage Appt Complete  Medication Screening Complete  Transportation Screening Complete  Some recent data might be hidden

## 2020-04-27 DIAGNOSIS — F0391 Unspecified dementia with behavioral disturbance: Secondary | ICD-10-CM | POA: Diagnosis not present

## 2020-04-27 LAB — COMPREHENSIVE METABOLIC PANEL
ALT: 28 U/L (ref 0–44)
AST: 20 U/L (ref 15–41)
Albumin: 3.1 g/dL — ABNORMAL LOW (ref 3.5–5.0)
Alkaline Phosphatase: 57 U/L (ref 38–126)
Anion gap: 3 — ABNORMAL LOW (ref 5–15)
BUN: 17 mg/dL (ref 6–20)
CO2: 31 mmol/L (ref 22–32)
Calcium: 9.2 mg/dL (ref 8.9–10.3)
Chloride: 105 mmol/L (ref 98–111)
Creatinine, Ser: 0.63 mg/dL (ref 0.44–1.00)
GFR, Estimated: >60 mL/min (ref >60)
Glucose, Bld: 76 mg/dL (ref 70–99)
Potassium: 3.9 mmol/L (ref 3.5–5.1)
Sodium: 139 mmol/L (ref 135–145)
Total Bilirubin: 0.6 mg/dL (ref 0.3–1.2)
Total Protein: 5.7 g/dL — ABNORMAL LOW (ref 6.5–8.1)

## 2020-04-27 MED ORDER — LORAZEPAM 0.5 MG PO TABS
0.5000 mg | ORAL_TABLET | Freq: Three times a day (TID) | ORAL | Status: DC
  Administered 2020-04-27 – 2020-05-16 (×59): 0.5 mg via ORAL
  Filled 2020-04-27 (×62): qty 1

## 2020-04-27 MED ORDER — CITALOPRAM HYDROBROMIDE 40 MG PO TABS
40.0000 mg | ORAL_TABLET | Freq: Every day | ORAL | Status: DC
  Administered 2020-04-27 – 2020-06-10 (×45): 40 mg via ORAL
  Filled 2020-04-27 (×47): qty 1

## 2020-04-27 NOTE — Progress Notes (Signed)
[facility] PROGRESS NOTE  Sherry Hicks DIY:[id number] DOB: 03/31/1960 DOA: 01/27/2020 PCP: [name], DO       03/29/2020                        04/12/2020   Status: The patient will require care spanning > 2 midnights and should be moved to inpatient because: Altered mental status and Unsafe d/c plan -difficult to place secondary to need for skilled nursing facility secondary to severe dementia.  Has required frequent medication adjustments to manage dementia related behaviors  Dispo:               The patient is from: Home              Anticipated d/c is to: SNF              Anticipated d/c date is: > 3 days              Barriers to discharge: Still awaiting SNF bed offer-behavioral disturbances secondary to severe dementia are better controlled with appropriate medications and diversions (baby doll).  Clinicals have been faxed out to multiple facilities but still no firm bed offers as of 3/8   Difficult to place patient Yes  Level of care: Med-Surg  Code Status: Full Family Communication: 3/15 spoke at length with patient's daughter [name].  Updated her on patient's current behaviors and plans to modestly adjust antidepressant and benzodiazepine dose. DVT prophylaxis: Ambulatory and low risk for DVT Vaccination status: Discussed with daughter [name].  She states on the whole she does not believe in vaccinations but understands that if the facility requires her mother to be vaccinated prior to injury she will allow her mother to receive a vaccination.  I did let her know that some facilities require full vaccination and that with the mRNA vaccines to be fully vaccinated takes up to 6 weeks with a 2-week waiting period after the second vaccine given.  Stated that she preferred the Pfizer vaccine and did not want her mother to have the Janssen vaccine  Foley catheter: No  HPI: 60 y.o.Fwith Alzheimer's dementia with behavioral disturbance who presented with  progressively erratic behavior over months.  See longer summary from H&P on 2/9, but in brief, patient had been living in [place] for years until family were informed by neighbors that patient was wandering, appeared unbathed, and she moved to [facility]. Here, she lived with daughter for about 2 years, but in the last few months, she started to behave more impulsively, to at times seem aggressive and have verbal outbursts, severe insomnia, and so APS recommended the patient be brought to the ER.  In the ER, the patient was seen by Psychiatry and started on new Seroquel. She was held pending geri-psych placement, which was delayed for several months.  Daytime behaviors improved on high dose Seroquel but continues with sundowning behaviors. Added Celexa as mood stabilizer along with Tegretol on 2/14.  Behaviors and nocturnal sleep also improved with the addition of scheduled twice daily Ativan.  Of note patient much more calm after her daughter brought up multiple items from home including a baby doll which the patient "cares for" regularly.   Subjective: Patient heard audibly in room talking to people who were not there.  She is fussing at them.  When I presented to the bedside to introduce myself and asked her how she was doing today.  Her mood and focus immediately changed and she  replied to me "they sweetie, how are you feeling today" after I left the room she once again renewed her conversation with people not Sherry Hicks.   Objective: Vitals:   04/26/20 2207 04/27/20 0458  BP: 130/80 111/71  Pulse: 78 (!) 59  Resp: 16 17  Temp: 98.1 F (36.7 C) 97.8 F (36.6 C)  SpO2: 97% 98%    Intake/Output Summary (Last 24 hours) at 04/27/2020 4696 Last data filed at 04/27/2020 2952 Gross per 24 hour  Intake 834 ml  Output 0 ml  Net 834 ml   Filed Weights   04/11/20 0300 04/16/20 0400 04/20/20 2203  Weight: 55.7 kg 57.7 kg 57.5 kg    Exam:  Constitutional: Alert, somewhat self agitated noting she  is talking to people that are not in the room but is easily redirectable once I entered the room. Respiratory: Lungs are clear to auscultation and she remained stable on room air Cardiovascular: Heart sounds are normal, skin is warm and dry, no peripheral edema Abdomen: Appetite remains variable with patient having a preference for sweets.  Abdomen is nontender and she has normoactive bowel sounds LBM 3/10 Neurologic: CN 2-12 grossly intact.  All extremities x4 without any focal neurological deficits.  Gait and ambulation not assessed. Psychiatric: Oriented times name only.  Patient with visual and possibly auditory hallucinations but is easily redirectable.  She is not violent and she is not aggressive.     Assessment/Plan: Acute problems: Dementia with behavioral disturbance Behaviors seem improved without behaviors that would require IM Geodon, Haldol or Seroquel prn Continue high-dose Seroquel-QTC checked on 2/14 less than 500 and no additional dose changes have been made; 3/3 Qtc 420 ms Continue Seroquel PO prn for breakthrough behaviors Celexa (mood stabilizer) increased to 20 mg/day on 2/21 Continue Tegretol 400 mg 8 am and 2 pm and 400 mg 6 pm. 3/5 LFTs repeated and are within normal limits-we will check LFTs again in a.m. on 3/15.  Patient is on max dose of Tegretol for a patient of her age so no room to increase Vistaril without any significant impact on behavior so was discontinued in favor of Ativan 3/15 will increase scheduled Ativan to 3 times daily 3/15 increase Celexa from 20 mg to 40 mg Continue gabapentin 2/14 reevaluated by PT and given her status of custodial care not appropriate for acute PT.  Was also evaluated by OT and patient did not have comprehension to participate with OT sessions and was actually actively hallucinating talking on the phone with her cousin therefore no acute OT services indicated as well.  Pressure injury Right anterior hip/Stage 2 -Continue air  mattress -Maximize nutrition (see below) and encourage mobility (OOB to chair/ambulate TID) Pressure Injury 04/01/20 Hip Anterior;Right Stage 2 -  Partial thickness loss of dermis presenting as a shallow open injury with a red, pink wound bed without slough. (Active)  Date First Assessed/Time First Assessed: 04/01/20 2230   Location: Hip  Location Orientation: Anterior;Right  Staging: Stage 2 -  Partial thickness loss of dermis presenting as a shallow open injury with a red, pink wound bed without slough.  Present ...    Assessments 04/01/2020 10:05 PM 04/27/2020 11:00 AM  Dressing Type Foam - Lift dressing to assess site every shift Foam - Lift dressing to assess site every shift  Dressing Clean;Dry;Intact Clean;Dry;Intact  Site / Wound Assessment Pink --  Peri-wound Assessment -- Intact  Drainage Amount None --     No Linked orders to display   Urinary  incontinence -Pericare and other personal care difficult to deliver to patient given her underlying dementia and fear of strangers providing such care -We will try to utilize mesh panties with peripads to minimize degree of personal-care to be given i.e. need to change clothes or change bed linens from significant incontinence of urine and or stool  Other problems: Moderate to severe protein calorie malnutrition -Continuefeeding supplements -Give ice cream or Magic cup twice daily -Albumin 2.9 on 2/14 Estimated body mass index is 20.46 kg/m as calculated from the following:   Height as of this encounter: 5\' 6"  (1.676 m).   Weight as of this encounter: 57.5 kg.  Transaminitis -Repeat LFTs ordered for 2/14 within normal limits and repeated again on 3/5 and remain within normal limits   Data Reviewed: Basic Metabolic Panel: No results for input(s): NA, K, CL, CO2, GLUCOSE, BUN, CREATININE, CALCIUM, MG, PHOS in the last 168 hours. Liver Function Tests: No results for input(s): AST, ALT, ALKPHOS, BILITOT, PROT, ALBUMIN in the last 168  hours. No results for input(s): LIPASE, AMYLASE in the last 168 hours. No results for input(s): AMMONIA in the last 168 hours. CBC: No results for input(s): WBC, NEUTROABS, HGB, HCT, MCV, PLT in the last 168 hours. Cardiac Enzymes: No results for input(s): CKTOTAL, CKMB, CKMBINDEX, TROPONINI in the last 168 hours. BNP (last 3 results) No results for input(s): BNP in the last 8760 hours.  ProBNP (last 3 results) No results for input(s): PROBNP in the last 8760 hours.  CBG: No results for input(s): GLUCAP in the last 168 hours.  No results found for this or any previous visit (from the past 240 hour(s)).   Studies: No results found.  Scheduled Meds: . carbamazepine  400 mg Oral BID   And  . carbamazepine  400 mg Oral QPM  . citalopram  20 mg Oral Daily  . feeding supplement  237 mL Oral BID BM  . gabapentin  100 mg Oral BID  . LORazepam  0.5 mg Oral BID  . QUEtiapine  100 mg Oral BID  . QUEtiapine  150 mg Oral QHS   Continuous Infusions:  Principal Problem:   Dementia with behavioral disturbance (HCC) Active Problems:   Major depressive disorder, recurrent episode (HCC)   History of asthma, mild, intermittent   Early Onset Dementia with behavioral problem (HCC)   Protein-calorie malnutrition, moderate (HCC)   Transaminitis   Pressure injury of skin   Alzheimer disease (HCC)   Pressure ulcer of right hip, stage 2 (HCC)   Consultants:  Psychiatry  Procedures:  None  Antibiotics: Anti-infectives (From admission, onward)   None       Time spent: 20 minutes    3/14 ANP  [facility] 7 am - 330 pm/M-F for direct patient care and secure chat Please refer to Amion for contact info 84  days

## 2020-04-27 NOTE — TOC Progression Note (Signed)
Transition of Care [facility]) - Progression Note    Patient Details  Name: Sherry Hicks MRN: [id number] Date of Birth: 12-20-1960  [facility]) CM/SW Contact  Janae Bridgeman, RN Phone Number: 04/27/2020, 1:12 PM  Clinical Narrative:    Case management spoke with Glenard Haring, CM with [facility] facility.  The facility is currently looking at the patient's clinicals for possible admission to the facility.  CM and MSW will continue to follow the patient for SNF placement when bed available to the patient.   Expected Discharge Plan: Skilled Nursing Facility Barriers to Discharge: Continued Medical Work up,Barriers Unresolved (comment),SNF Pending bed offer  Expected Discharge Plan and Services Expected Discharge Plan: Skilled Nursing Facility In-house Referral: Clinical Social Work Discharge Planning Services: CM Consult Post Acute Care Choice: Skilled Nursing Facility Living arrangements for the past 2 months: Apartment                                       Social Determinants of Health (SDOH) Interventions    Readmission Risk Interventions Readmission Risk Prevention Plan 04/06/2020  Post Dischage Appt Complete  Medication Screening Complete  Transportation Screening Complete  Some recent data might be hidden

## 2020-04-28 MED ORDER — ENOXAPARIN SODIUM 40 MG/0.4ML ~~LOC~~ SOLN
40.0000 mg | SUBCUTANEOUS | Status: DC
  Administered 2020-04-28 – 2020-06-10 (×40): 40 mg via SUBCUTANEOUS
  Filled 2020-04-28 (×42): qty 0.4

## 2020-04-28 NOTE — Plan of Care (Signed)
  Problem: Activity: Goal: Risk for activity intolerance will decrease Outcome: Progressing   

## 2020-04-28 NOTE — TOC Progression Note (Signed)
Transition of Care [facility]) - Progression Note    Patient Details  Name: Sherry Hicks MRN: [id number] Date of Birth: 19-Jun-1960  Transition of Care [facility]) CM/SW Contact  Janae Bridgeman, RN Phone Number: 04/28/2020, 3:09 PM  Clinical Narrative:    CM aware that [facility] in [place] has multiple LTC beds open at this time.  The patient's clinicals were sent in the hub to [facility] in [place] and [place], [place] and a message was left with the CM at the facility to inquire about possible bed offer.  CM and MSW will continue to explore admission opportunities for this patient for LTC placement at a facility. [facility] and [facility] are currently reviewing the patients recent clinicals.   Expected Discharge Plan: Skilled Nursing Facility Barriers to Discharge: Continued Medical Work up,Barriers Unresolved (comment),SNF Pending bed offer  Expected Discharge Plan and Services Expected Discharge Plan: Skilled Nursing Facility In-house Referral: Clinical Social Work Discharge Planning Services: CM Consult Post Acute Care Choice: Skilled Nursing Facility Living arrangements for the past 2 months: Apartment                                       Social Determinants of Health (SDOH) Interventions    Readmission Risk Interventions Readmission Risk Prevention Plan 04/06/2020  Post Dischage Appt Complete  Medication Screening Complete  Transportation Screening Complete  Some recent data might be hidden

## 2020-04-28 NOTE — Progress Notes (Signed)
Pt seen and examined, laying in bed watching TV CSW following for LTC placement Continue current care  Zannie Cove, MD

## 2020-04-29 NOTE — Plan of Care (Signed)
?  Problem: Coping: ?Goal: Level of anxiety will decrease ?Outcome: Progressing ?  ?Problem: Safety: ?Goal: Ability to remain free from injury will improve ?Outcome: Progressing ?  ?

## 2020-04-30 DIAGNOSIS — F331 Major depressive disorder, recurrent, moderate: Secondary | ICD-10-CM

## 2020-04-30 NOTE — Plan of Care (Signed)
  Problem: Activity: Goal: Risk for activity intolerance will decrease Outcome: Progressing   Problem: Coping: Goal: Level of anxiety will decrease Outcome: Progressing   

## 2020-04-30 NOTE — Progress Notes (Signed)
Patient seen and examined, sitting up in bed just finished lunch -Very verbose, angry and a little irritable -Continue current regimen of Seroquel, Tegretol, Celexa etc. as recommended by psychiatry -awaiting long term placement  Zannie Cove, MD

## 2020-05-01 NOTE — Plan of Care (Signed)
  Problem: Education: Goal: Knowledge of General Education information will improve Description: Including pain rating scale, medication(s)/side effects and non-pharmacologic comfort measures Outcome: Progressing   Problem: Health Behavior/Discharge Planning: Goal: Ability to manage health-related needs will improve Outcome: Progressing   Problem: Clinical Measurements: Goal: Ability to maintain clinical measurements within normal limits will improve Outcome: Progressing Goal: Will remain free from infection Outcome: Progressing Goal: Diagnostic test results will improve Outcome: Progressing Goal: Cardiovascular complication will be avoided Outcome: Progressing   Problem: Activity: Goal: Risk for activity intolerance will decrease Outcome: Progressing   Problem: Coping: Goal: Level of anxiety will decrease Outcome: Progressing   Problem: Elimination: Goal: Will not experience complications related to bowel motility Outcome: Progressing Goal: Will not experience complications related to urinary retention Outcome: Progressing   Problem: Pain Managment: Goal: General experience of comfort will improve Outcome: Progressing   Problem: Safety: Goal: Ability to remain free from injury will improve Outcome: Progressing   Problem: Skin Integrity: Goal: Risk for impaired skin integrity will decrease Outcome: Progressing   

## 2020-05-01 NOTE — Progress Notes (Signed)
Pt seen, no changes from yesterday -continue current care  Zannie Cove, MD

## 2020-05-02 NOTE — Progress Notes (Addendum)
Subjective: no events overnight, sitting in bed, NT feeding lunch  Exam: awake, alert, frail and malnourished, rambling speech,  no distress CVS: S1S2/RRR Lungs: clear Abd: soft, NT Ext: no edema  Assessment and PLan -60/F with Dementia and psych disorder NOS, admitted with behavioral problems with ongoing cognitive decline -currently on a regimen of Seroquel, tegretol, lorazepam and PRN Haldol -awaiting Long term placement  Zannie Cove, MD

## 2020-05-03 DIAGNOSIS — F0391 Unspecified dementia with behavioral disturbance: Secondary | ICD-10-CM | POA: Diagnosis not present

## 2020-05-03 NOTE — Progress Notes (Signed)
CSW was notified by Wilfred Lacy at [facility] that the facility cannot offer this patient a bed at this time.   Edwin Dada, MSW, LCSW Transitions of Care  Clinical Social Worker II [phone]

## 2020-05-03 NOTE — Plan of Care (Signed)
?  Problem: Coping: ?Goal: Level of anxiety will decrease ?Outcome: Progressing ?  ?Problem: Safety: ?Goal: Ability to remain free from injury will improve ?Outcome: Progressing ?  ?

## 2020-05-03 NOTE — Progress Notes (Signed)
[facility] PROGRESS NOTE  Sherry Hicks VZD:[id number] DOB: [birth year] DOA: 01/27/2020 PCP: [name], DO       03/29/2020                        04/12/2020   Status: The patient will require care spanning > 2 midnights and should be moved to inpatient because: Altered mental status and Unsafe d/c plan -difficult to place secondary to need for skilled nursing facility secondary to severe dementia.  Has required frequent medication adjustments to manage dementia related behaviors  Dispo:               The patient is from: Home              Anticipated d/c is to: SNF              Anticipated d/c date is: > 3 days              Barriers to discharge: Still awaiting SNF bed offer-behavioral disturbances secondary to severe dementia are better controlled with appropriate medications and diversions (baby doll).  Clinicals have been faxed out to multiple facilities but still no firm bed offers as of 3/8   Difficult to place patient Yes  Level of care: Med-Surg  Code Status: Full Family Communication: 3/15 spoke at length with patient's daughter [name].  Updated her on patient's current behaviors and plans to modestly adjust antidepressant and benzodiazepine dose. DVT prophylaxis: Ambulatory and low risk for DVT Vaccination status: Discussed with daughter [name].  She states on the whole she does not believe in vaccinations but understands that if the facility requires her mother to be vaccinated prior to injury she will allow her mother to receive a vaccination.  I did let her know that some facilities require full vaccination and that with the mRNA vaccines to be fully vaccinated takes up to 6 weeks with a 2-week waiting period after the second vaccine given.  Stated that she preferred the Pfizer vaccine and did not want her mother to have the Janssen vaccine  Foley catheter: No  HPI: 59 y.o.Fwith Alzheimer's dementia with behavioral disturbance who presented with  progressively erratic behavior over months.  See longer summary from H&P on 2/9, but in brief, patient had been living in [place] for years until family were informed by neighbors that patient was wandering, appeared unbathed, and she moved to [facility]. Here, she lived with daughter for about 2 years, but in the last few months, she started to behave more impulsively, to at times seem aggressive and have verbal outbursts, severe insomnia, and so APS recommended the patient be brought to the ER.  In the ER, the patient was seen by Psychiatry and started on new Seroquel. She was held pending geri-psych placement, which was delayed for several months.  Daytime behaviors improved on high dose Seroquel but continues with sundowning behaviors. Added Celexa as mood stabilizer along with Tegretol on 2/14.  Behaviors and nocturnal sleep also improved with the addition of scheduled twice daily Ativan.  Of note patient much more calm after her daughter brought up multiple items from home including a baby doll which the patient "cares for" regularly.   Subjective: Sleeping soundly and did not disturb   Objective: Vitals:   05/02/20 2141 05/03/20 0538  BP: (!) 176/77 126/88  Pulse: 92 70  Resp: 18 16  Temp: 99.2 F (37.3 C) 97.6 F (36.4 C)  SpO2: 100% 100%  Intake/Output Summary (Last 24 hours) at 05/03/2020 0830 Last data filed at 05/03/2020 0534 Gross per 24 hour  Intake 1080 ml  Output 0 ml  Net 1080 ml   Filed Weights   04/16/20 0400 04/20/20 2203 05/01/20 0625  Weight: 57.7 kg 57.5 kg 59.8 kg    Exam:  Constitutional: Sleeping soundly and appears calm and in no acute distress Respiratory: Lung sounds are clear, remains on room air Cardiovascular: Skin warm and dry, pulse regular, no peripheral edema Abdomen: Soft with bowel sounds present.  Variable oral intake.  LBM 3/10 Neurologic: CN 2-12 grossly intact.  All extremities x4 without any focal neurological deficits.  Gait and  ambulation not assessed. Psychiatric: Sleeping so did not assess.  At baseline oriented to self only     Assessment/Plan: Acute problems: Dementia with behavioral disturbance Behaviors significantly improved improved and has not required any as needed antipsychotics in greater than 3+ weeks  Continue high-dose Seroquel-QTC checked on 2/14 less than 500 and no additional dose changes have been made; 3/3 Qtc 420 ms Continue Seroquel PO prn for breakthrough behaviors Celexa (mood stabilizer) increased to 20 mg/day on 2/21 Continue Tegretol 400 mg 8 am and 2 pm and 400 mg 6 pm. 3/5 LFTs repeated and are within normal limits-we will check LFTs again in a.m. on 3/15.  Patient is on max dose of Tegretol for a patient of her age so no room to increase Vistaril without any significant impact on behavior so was discontinued in favor of Ativan 3/15 will increase scheduled Ativan to 3 times daily 3/15 increase Celexa from 20 mg to 40 mg Continue gabapentin 2/14 reevaluated by PT and given her status of custodial care not appropriate for acute PT.  Was also evaluated by OT and patient did not have comprehension to participate with OT sessions and was actually actively hallucinating talking on the phone with her cousin therefore no acute OT services indicated as well.  Pressure injury Right anterior hip/Stage 2 -Continue air mattress -Maximize nutrition (see below) and encourage mobility (OOB to chair/ambulate TID) Pressure Injury 04/01/20 Hip Anterior;Right Stage 2 -  Partial thickness loss of dermis presenting as a shallow open injury with a red, pink wound bed without slough. (Active)  Date First Assessed/Time First Assessed: 04/01/20 2230   Location: Hip  Location Orientation: Anterior;Right  Staging: Stage 2 -  Partial thickness loss of dermis presenting as a shallow open injury with a red, pink wound bed without slough.  Present ...    Assessments 04/01/2020 10:05 PM 05/03/2020 11:25 AM  Dressing  Type Foam - Lift dressing to assess site every shift Foam - Lift dressing to assess site every shift  Dressing Clean;Dry;Intact Clean;Dry;Intact  State of Healing -- Eschar  Site / Wound Assessment Pink Dressing in place / Unable to assess  Margins -- Attached edges (approximated)  Drainage Amount None None     No Linked orders to display   Urinary incontinence -Pericare and other personal care difficult to deliver to patient given her underlying dementia and fear of strangers providing such care -We will try to utilize mesh panties with peripads to minimize degree of personal-care to be given i.e. need to change clothes or change bed linens from significant incontinence of urine and or stool  Other problems: Moderate to severe protein calorie malnutrition -Continuefeeding supplements -Give ice cream or Magic cup twice daily -Albumin 2.9 on 2/14 Estimated body mass index is 21.28 kg/m as calculated from the following:   Height  as of this encounter: 5\' 6"  (1.676 m).   Weight as of this encounter: 59.8 kg.  Transaminitis -Repeat LFTs ordered for 2/14 within normal limits and repeated again on 3/5 and remain within normal limits   Data Reviewed: Basic Metabolic Panel: Recent Labs  Lab 04/27/20 0655  NA 139  K 3.9  CL 105  CO2 31  GLUCOSE 76  BUN 17  CREATININE 0.63  CALCIUM 9.2   Liver Function Tests: Recent Labs  Lab 04/27/20 0655  AST 20  ALT 28  ALKPHOS 57  BILITOT 0.6  PROT 5.7*  ALBUMIN 3.1*   No results for input(s): LIPASE, AMYLASE in the last 168 hours. No results for input(s): AMMONIA in the last 168 hours. CBC: No results for input(s): WBC, NEUTROABS, HGB, HCT, MCV, PLT in the last 168 hours. Cardiac Enzymes: No results for input(s): CKTOTAL, CKMB, CKMBINDEX, TROPONINI in the last 168 hours. BNP (last 3 results) No results for input(s): BNP in the last 8760 hours.  ProBNP (last 3 results) No results for input(s): PROBNP in the last 8760  hours.  CBG: No results for input(s): GLUCAP in the last 168 hours.  No results found for this or any previous visit (from the past 240 hour(s)).   Studies: No results found.  Scheduled Meds: . carbamazepine  400 mg Oral BID   And  . carbamazepine  400 mg Oral QPM  . citalopram  40 mg Oral Daily  . enoxaparin (LOVENOX) injection  40 mg Subcutaneous Q24H  . feeding supplement  237 mL Oral BID BM  . gabapentin  100 mg Oral BID  . LORazepam  0.5 mg Oral TID  . QUEtiapine  100 mg Oral BID  . QUEtiapine  150 mg Oral QHS   Continuous Infusions:  Principal Problem:   Dementia with behavioral disturbance (HCC) Active Problems:   Major depressive disorder, recurrent episode (HCC)   History of asthma, mild, intermittent   Early Onset Dementia with behavioral problem (HCC)   Protein-calorie malnutrition, moderate (HCC)   Transaminitis   Pressure injury of skin   Alzheimer disease (HCC)   Pressure ulcer of right hip, stage 2 (HCC)   Consultants:  Psychiatry  Procedures:  None  Antibiotics: Anti-infectives (From admission, onward)   None       Time spent: 20 minutes    04/29/20 ANP  [facility] 7 am - 330 pm/M-F for direct patient care and secure chat Please refer to Amion for contact info 96 days

## 2020-05-04 DIAGNOSIS — F0391 Unspecified dementia with behavioral disturbance: Secondary | ICD-10-CM | POA: Diagnosis not present

## 2020-05-04 NOTE — Plan of Care (Signed)
  Problem: Activity: Goal: Risk for activity intolerance will decrease Outcome: Progressing   Problem: Coping: Goal: Level of anxiety will decrease Outcome: Progressing   Problem: Safety: Goal: Ability to remain free from injury will improve Outcome: Progressing   

## 2020-05-04 NOTE — Progress Notes (Addendum)
[facility] PROGRESS NOTE  Sherry Hicks GEX:[id number] DOB: 1960/12/31 DOA: 01/27/2020 PCP: Sherry Jumbo, DO       03/29/2020                        04/12/2020   Status: The patient will require care spanning > 2 midnights and should be moved to inpatient because: Altered mental status and Unsafe d/c plan -difficult to place secondary to need for skilled nursing facility secondary to severe dementia.  Has required frequent medication adjustments to manage dementia related behaviors  Dispo:               The patient is from: Home              Anticipated d/c is to: SNF              Anticipated d/c date is: > 3 days              Barriers to discharge: Still awaiting SNF bed offer-behavioral disturbances secondary to severe dementia are better controlled with appropriate medications and diversions (baby doll).  Clinicals have been faxed out to multiple facilities but still no firm bed offers as of 3/8   Difficult to place patient Yes  Level of care: Med-Surg  Code Status: Full Family Communication: 3/22 Spoke w/ dtr Sherry Hicks had a GI illness-still sick w/ GI sx's and will wait until tomorrow to visit DVT prophylaxis: Ambulatory and low risk for DVT Vaccination status: Discussed with daughter Sherry Hicks.  She states on the whole she does not believe in vaccinations but understands that if the facility requires her mother to be vaccinated prior to injury she will allow her mother to receive a vaccination.  I did let her know that some facilities require full vaccination and that with the mRNA vaccines to be fully vaccinated takes up to 6 weeks with a 2-week waiting period after the second vaccine given.  Stated that she preferred the Pfizer vaccine and did not want her mother to have the Janssen vaccine  Foley catheter: No  HPI: 60 y.o.Fwith Alzheimer's dementia with behavioral disturbance who presented with progressively erratic behavior over months.  See longer summary  from H&P on 2/9, but in brief, patient had been living in [place] for years until family were informed by neighbors that patient was wandering, appeared unbathed, and she moved to [facility]. Here, she lived with daughter for about 2 years, but in the last few months, she started to behave more impulsively, to at times seem aggressive and have verbal outbursts, severe insomnia, and so APS recommended the patient be brought to the ER.  In the ER, the patient was seen by Psychiatry and started on new Seroquel. She was held pending geri-psych placement, which was delayed for several months.  Daytime behaviors improved on high dose Seroquel but continues with sundowning behaviors. Added Celexa as mood stabilizer along with Tegretol on 2/14.  Behaviors and nocturnal sleep also improved with the addition of scheduled twice daily Ativan.  Of note patient much more calm after her daughter brought up multiple items from home including a baby doll which the patient "cares for" regularly.   Subjective: Patient awake and alert.  Was talking to herself upon my entry into the room.  Did interact with me but conversation not appropriate to questions asked.  Continue to have rambling type conversation and at times was talking to people not in the room.  Overall was pleasant.  Greeted her goodbye but she did not acknowledge that I was leaving the room.   Objective: Vitals:   05/03/20 1025 05/03/20 1700  BP: 132/74 130/72  Pulse: 78 80  Resp: 16 18  Temp: 97.8 F (36.6 C) 97.9 F (36.6 C)  SpO2: 98% 98%    Intake/Output Summary (Last 24 hours) at 05/04/2020 0841 Last data filed at 05/04/2020 0748 Gross per 24 hour  Intake 1710 ml  Output 0 ml  Net 1710 ml   Filed Weights   04/16/20 0400 04/20/20 2203 05/01/20 0625  Weight: 57.7 kg 57.5 kg 59.8 kg    Exam:  Constitutional: Alert, calm, no acute distress Respiratory: Lungs are clear, room air Cardiovascular: Normal heart sounds, no peripheral edema,  pulse regular Abdomen:  LBM 3/20, soft nontender.  Bowel sounds present.  Variable oral intake and prefers sweets Neurologic: CN 2-12 grossly intact.  Exam nonfocal. Psychiatric: Alert, oriented times name only.  Incoherent today noting is having rambling conversations with people not in the room.  Overall was pleasant and not agitated and has not demonstrated aggressive behaviors.     Assessment/Plan: Acute problems: Dementia with behavioral disturbance Behaviors significantly improved improved and has not required any as needed antipsychotics in greater than 3+ weeks  Continue high-dose Seroquel-QTC checked on 2/14 less than 500 and no additional dose changes have been made; 3/3 Qtc 420 ms Continue Seroquel PO prn for breakthrough behaviors Celexa (mood stabilizer) increased to 20 mg/day on 2/21 Continue Tegretol 400 mg 8 am and 2 pm and 400 mg 6 pm. 3/5 LFTs repeated and are within normal limits-we will check LFTs again in a.m. on 3/15.  Patient is on max dose of Tegretol for a patient of her age so no room to increase Vistaril without any significant impact on behavior so was discontinued in favor of Ativan Continue Ativan to 3 times daily Continue Celexa 40 mg daily Continue gabapentin 2/14 reevaluated by PT and given her status of custodial care not appropriate for acute PT.  Was also evaluated by OT and patient did not have comprehension to participate with OT sessions and was actually actively hallucinating talking on the phone with her cousin therefore no acute OT services indicated as well.  Pressure injury Right anterior hip/Stage 2 -Continue air mattress -Maximize nutrition (see below) and encourage mobility (OOB to chair/ambulate TID) Pressure Injury 04/01/20 Hip Anterior;Right Stage 2 -  Partial thickness loss of dermis presenting as a shallow open injury with a red, pink wound bed without slough. (Active)  Date First Assessed/Time First Assessed: 04/01/20 2230   Location: Hip   Location Orientation: Anterior;Right  Staging: Stage 2 -  Partial thickness loss of dermis presenting as a shallow open injury with a red, pink wound bed without slough.  Present ...    Assessments 04/01/2020 10:05 PM 05/03/2020  9:00 PM  Dressing Type Foam - Lift dressing to assess site every shift Foam - Lift dressing to assess site every shift  Dressing Clean;Dry;Intact Clean;Dry;Intact  Site / Wound Assessment Pink -  Drainage Amount None -     No Linked orders to display   Urinary incontinence -Pericare and other personal care difficult to deliver to patient given her underlying dementia and fear of strangers providing such care -We will try to utilize mesh panties with peripads to minimize degree of personal-care to be given i.e. need to change clothes or change bed linens from significant incontinence of urine and or stool  Other problems: Moderate to severe  protein calorie malnutrition -Continuefeeding supplements -Give ice cream or Magic cup twice daily -Albumin 2.9 on 2/14 Estimated body mass index is 21.28 kg/m as calculated from the following:   Height as of this encounter: 5\' 6"  (1.676 m).   Weight as of this encounter: 59.8 kg.  Transaminitis -Repeat LFTs ordered for 2/14 within normal limits and repeated again on 3/5 and remain within normal limits   Data Reviewed: Basic Metabolic Panel: No results for input(s): NA, K, CL, CO2, GLUCOSE, BUN, CREATININE, CALCIUM, MG, PHOS in the last 168 hours. Liver Function Tests: No results for input(s): AST, ALT, ALKPHOS, BILITOT, PROT, ALBUMIN in the last 168 hours. No results for input(s): LIPASE, AMYLASE in the last 168 hours. No results for input(s): AMMONIA in the last 168 hours. CBC: No results for input(s): WBC, NEUTROABS, HGB, HCT, MCV, PLT in the last 168 hours. Cardiac Enzymes: No results for input(s): CKTOTAL, CKMB, CKMBINDEX, TROPONINI in the last 168 hours. BNP (last 3 results) No results for input(s): BNP in the  last 8760 hours.  ProBNP (last 3 results) No results for input(s): PROBNP in the last 8760 hours.  CBG: No results for input(s): GLUCAP in the last 168 hours.  No results found for this or any previous visit (from the past 240 hour(s)).   Studies: No results found.  Scheduled Meds: . carbamazepine  400 mg Oral BID   And  . carbamazepine  400 mg Oral QPM  . citalopram  40 mg Oral Daily  . enoxaparin (LOVENOX) injection  40 mg Subcutaneous Q24H  . feeding supplement  237 mL Oral BID BM  . gabapentin  100 mg Oral BID  . LORazepam  0.5 mg Oral TID  . QUEtiapine  100 mg Oral BID  . QUEtiapine  150 mg Oral QHS   Continuous Infusions:  Principal Problem:   Dementia with behavioral disturbance (HCC) Active Problems:   Major depressive disorder, recurrent episode (HCC)   History of asthma, mild, intermittent   Early Onset Dementia with behavioral problem (HCC)   Protein-calorie malnutrition, moderate (HCC)   Transaminitis   Pressure injury of skin   Alzheimer disease (HCC)   Pressure ulcer of right hip, stage 2 (HCC)   Consultants:  Psychiatry  Procedures:  None  Antibiotics: Anti-infectives (From admission, onward)   None       Time spent: 20 minutes    3/14 ANP  [facility] 7 am - 330 pm/M-F for direct patient care and secure chat Please refer to Amion for contact info 96 days

## 2020-05-05 DIAGNOSIS — F0391 Unspecified dementia with behavioral disturbance: Secondary | ICD-10-CM | POA: Diagnosis not present

## 2020-05-05 NOTE — Plan of Care (Signed)
  Problem: Education: Goal: Knowledge of General Education information will improve Description: Including pain rating scale, medication(s)/side effects and non-pharmacologic comfort measures Outcome: Not Applicable   

## 2020-05-05 NOTE — Progress Notes (Signed)
[facility] PROGRESS NOTE  Sherry Hicks:[id number] DOB: Jul 01, 1960 DOA: 01/27/2020 PCP: Sherry Jumbo, DO       03/29/2020                        04/12/2020   Status: The patient will require care spanning > 2 midnights and should be moved to inpatient because: Altered mental status and Unsafe d/c plan -difficult to place secondary to need for skilled nursing facility secondary to severe dementia.  Has required frequent medication adjustments to manage dementia related behaviors  Dispo:               The patient is from: Home              Anticipated d/c is to: SNF              Anticipated d/c date is: > 3 days              Barriers to discharge: Still awaiting SNF bed offer-behavioral disturbances secondary to severe dementia are better controlled with appropriate medications and diversions (baby doll).  Clinicals have been faxed out to multiple facilities but still no firm bed offers as of 3/8   Difficult to place patient Yes  Level of care: Med-Surg  Code Status: Full Family Communication: 3/22 Spoke w/ dtr Sherry Hicks had a GI illness-still sick w/ GI sx's and will wait until tomorrow to visit DVT prophylaxis: Ambulatory and low risk for DVT Vaccination status: Discussed with daughter Sherry Hicks.  She states on the whole she does not believe in vaccinations but understands that if the facility requires her mother to be vaccinated prior to injury she will allow her mother to receive a vaccination.  I did let her know that some facilities require full vaccination and that with the mRNA vaccines to be fully vaccinated takes up to 6 weeks with a 2-week waiting period after the second vaccine given.  Stated that she preferred the Pfizer vaccine and did not want her mother to have the Janssen vaccine  Foley catheter: No  HPI: 60 y.o.Fwith Alzheimer's dementia with behavioral disturbance who presented with progressively erratic behavior over months.  See longer summary  from H&P on 2/9, but in brief, patient had been living in [place] for years until family were informed by neighbors that patient was wandering, appeared unbathed, and she moved to [place]. Here, she lived with daughter for about 2 years, but in the last few months, she started to behave more impulsively, to at times seem aggressive and have verbal outbursts, severe insomnia, and so APS recommended the patient be brought to the ER.  In the ER, the patient was seen by Psychiatry and started on new Seroquel. She was held pending geri-psych placement, which was delayed for several months.  Daytime behaviors improved on high dose Seroquel but continues with sundowning behaviors. Added Celexa as mood stabilizer along with Tegretol on 2/14.  Behaviors and nocturnal sleep also improved with the addition of scheduled twice daily Ativan.  Of note patient much more calm after her daughter brought up multiple items from home including a baby doll which the patient "cares for" regularly.   Subjective: Sleepy today.  Briefly awakened to mild voice spoke to me and then went back to sleep.   Objective: Vitals:   05/04/20 2039 05/05/20 0600  BP: 103/61 108/64  Pulse: 72 70  Resp: 16 20  Temp:  98 F (36.7 C)  SpO2: 100%  99%    Intake/Output Summary (Last 24 hours) at 05/05/2020 0831 Last data filed at 05/05/2020 0600 Gross per 24 hour  Intake 300 ml  Output 0 ml  Net 300 ml   Filed Weights   04/16/20 0400 04/20/20 2203 05/01/20 0625  Weight: 57.7 kg 57.5 kg 59.8 kg    Exam:  Constitutional: Sleeping, calm, no acute distress Respiratory: Lung sounds are clear and she remained stable on room air, no increased work of breathing at rest Cardiovascular: Heart sounds are S1-S2, pulse is regular, extremities without edema Abdomen:  LBM 3/20, soft nontender.  Eats inconsistently and prefers sweets Neurologic: CN 2-12 grossly intact.  Exam nonfocal. Psychiatric: Sleeping, briefly awakened.  At baseline  she is oriented to name only.     Assessment/Plan: Acute problems: Dementia with behavioral disturbance Behaviors significantly improved improved.  No documented behaviors that would warrant as needed antipsychotics therefore as needed Haldol and as needed Seroquel discontinued. Continue Seroquel-QTC 2/14 <500; 3/3 Qtc 420 ms Continue Tegretol 400 mg 8 am and 2 pm and 400 mg 6 pm. 3/5 LFTs repeated and are within normal limits-we will check LFTs again in a.m. on 3/15.  Patient is on max dose of Tegretol for a patient of her age so no room to increase Vistaril without any significant impact on behavior so was discontinued in favor of Ativan Continue Ativan to 3 times daily Continue Celexa 40 mg daily Continue gabapentin 2/14 reevaluated by PT and given her status of custodial care not appropriate for acute PT.  Was also evaluated by OT and patient did not have comprehension to participate with OT sessions and was actually actively hallucinating talking on the phone with her cousin therefore no acute OT services indicated as well.  Pressure injury Right anterior hip/Stage 2 -Continue air mattress -Maximize nutrition (see below) and encourage mobility (OOB to chair/ambulate TID) Pressure Injury 04/01/20 Hip Anterior;Right Stage 2 -  Partial thickness loss of dermis presenting as a shallow open injury with a red, pink wound bed without slough. (Active)  Date First Assessed/Time First Assessed: 04/01/20 2230   Location: Hip  Location Orientation: Anterior;Right  Staging: Stage 2 -  Partial thickness loss of dermis presenting as a shallow open injury with a red, pink wound bed without slough.  Present ...    Assessments 04/01/2020 10:05 PM 05/04/2020  1:47 PM  Dressing Type Foam - Lift dressing to assess site every shift Foam - Lift dressing to assess site every shift  Dressing Clean;Dry;Intact Intact;Dry  Site / Wound Assessment Pink --  Drainage Amount None --     No Linked orders to display    Urinary incontinence -We will try to utilize mesh panties with peripads to minimize degree of personal-care to be given i.e. need to change clothes or change bed linens from significant incontinence of urine and or stool  Other problems: Moderate to severe protein calorie malnutrition -Continuefeeding supplements -Give ice cream or Magic cup twice daily -Albumin 2.9 on 2/14 Estimated body mass index is 21.28 kg/m as calculated from the following:   Height as of this encounter: 5\' 6"  (1.676 m).   Weight as of this encounter: 59.8 kg.  Transaminitis -Resolved   Data Reviewed: Basic Metabolic Panel: No results for input(s): NA, K, CL, CO2, GLUCOSE, BUN, CREATININE, CALCIUM, MG, PHOS in the last 168 hours. Liver Function Tests: No results for input(s): AST, ALT, ALKPHOS, BILITOT, PROT, ALBUMIN in the last 168 hours. No results for input(s): LIPASE, AMYLASE in the  last 168 hours. No results for input(s): AMMONIA in the last 168 hours. CBC: No results for input(s): WBC, NEUTROABS, HGB, HCT, MCV, PLT in the last 168 hours. Cardiac Enzymes: No results for input(s): CKTOTAL, CKMB, CKMBINDEX, TROPONINI in the last 168 hours. BNP (last 3 results) No results for input(s): BNP in the last 8760 hours.  ProBNP (last 3 results) No results for input(s): PROBNP in the last 8760 hours.  CBG: No results for input(s): GLUCAP in the last 168 hours.  No results found for this or any previous visit (from the past 240 hour(s)).   Studies: No results found.  Scheduled Meds: . carbamazepine  400 mg Oral BID   And  . carbamazepine  400 mg Oral QPM  . citalopram  40 mg Oral Daily  . enoxaparin (LOVENOX) injection  40 mg Subcutaneous Q24H  . feeding supplement  237 mL Oral BID BM  . gabapentin  100 mg Oral BID  . LORazepam  0.5 mg Oral TID  . QUEtiapine  100 mg Oral BID  . QUEtiapine  150 mg Oral QHS   Continuous Infusions:  Principal Problem:   Dementia with behavioral disturbance  (HCC) Active Problems:   Major depressive disorder, recurrent episode (HCC)   History of asthma, mild, intermittent   Early Onset Dementia with behavioral problem (HCC)   Protein-calorie malnutrition, moderate (HCC)   Transaminitis   Pressure injury of skin   Alzheimer disease (HCC)   Pressure ulcer of right hip, stage 2 (HCC)   Consultants:  Psychiatry  Procedures:  None  Antibiotics: Anti-infectives (From admission, onward)   None       Time spent: 20 minutes    Junious Silk ANP  [facility] 7 am - 330 pm/M-F for direct patient care and secure chat Please refer to Amion for contact info 96 days

## 2020-05-05 NOTE — Plan of Care (Signed)
  Problem: Clinical Measurements: Goal: Will remain free from infection Outcome: Completed/Met Goal: Diagnostic test results will improve Outcome: Completed/Met Goal: Cardiovascular complication will be avoided Outcome: Completed/Met   Problem: Elimination: Goal: Will not experience complications related to bowel motility Outcome: Completed/Met Goal: Will not experience complications related to urinary retention Outcome: Completed/Met   Problem: Pain Managment: Goal: General experience of comfort will improve Outcome: Completed/Met

## 2020-05-05 NOTE — Progress Notes (Signed)
CSW spoke with Fay Records, RN of [facility] [employer]. CSW explained placement efforts and barriers to placement. Judeth Cornfield states the patient will be removed from CAP list but can resume services at discharge if desired.  Edwin Dada, MSW, LCSW Transitions of Care  Clinical Social Worker II [phone]

## 2020-05-05 NOTE — Progress Notes (Signed)
CSW spoke with Alcario Drought at [facility] in [place] - she is willing to review the referral. CSW faxed clinicals to [facility] for review.  Edwin Dada, MSW, LCSW Transitions of Care  Clinical Social Worker II [phone]

## 2020-05-06 DIAGNOSIS — F0391 Unspecified dementia with behavioral disturbance: Secondary | ICD-10-CM | POA: Diagnosis not present

## 2020-05-06 NOTE — Progress Notes (Addendum)
CSW spoke with Alcario Drought at [facility] in [place] - she states the administrator from [facility] facility will come visit the patient on Monday morning to complete an interview.  Edwin Dada, MSW, LCSW Transitions of Care  Clinical Social Worker II [phone]

## 2020-05-06 NOTE — Progress Notes (Signed)
[facility] PROGRESS NOTE  Sherry Hicks IEP:[id number] DOB: 1960-10-04 DOA: 01/27/2020 PCP: [name], DO       03/29/2020                        04/12/2020   Status: The patient will require care spanning > 2 midnights and should be moved to inpatient because: Altered mental status and Unsafe d/c plan -difficult to place secondary to need for skilled nursing facility secondary to severe dementia.  Has required frequent medication adjustments to manage dementia related behaviors  Dispo:               The patient is from: Home              Anticipated d/c is to: SNF              Anticipated d/c date is: > 3 days              Barriers to discharge: Still awaiting SNF bed offer-behavioral disturbances secondary to severe dementia are better controlled with appropriate medications and diversions (baby doll).  Clinicals have been faxed out to multiple facilities but still no firm bed offers as of 3/8   Difficult to place patient Yes  Level of care: Med-Surg  Code Status: Full Family Communication: 3/22 Spoke w/ dtr Sherry Hicks had a GI illness-still sick w/ GI sx's and will wait until tomorrow to visit DVT prophylaxis: Ambulatory and low risk for DVT Vaccination status: Discussed with daughter Sherry Hicks.  She states on the whole she does not believe in vaccinations but understands that if the facility requires her mother to be vaccinated prior to injury she will allow her mother to receive a vaccination.  I did let her know that some facilities require full vaccination and that with the mRNA vaccines to be fully vaccinated takes up to 6 weeks with a 2-week waiting period after the second vaccine given.  Stated that she preferred the Pfizer vaccine and did not want her mother to have the Janssen vaccine  Foley catheter: No  HPI: 60 y.o.Fwith Alzheimer's dementia with behavioral disturbance who presented with progressively erratic behavior over months.  See longer summary  from H&P on 2/9, but in brief, patient had been living in [place] for years until family were informed by neighbors that patient was wandering, appeared unbathed, and she moved to [facility]. Here, she lived with daughter for about 2 years, but in the last few months, she started to behave more impulsively, to at times seem aggressive and have verbal outbursts, severe insomnia, and so APS recommended the patient be brought to the ER.  In the ER, the patient was seen by Psychiatry and started on new Seroquel. She was held pending geri-psych placement, which was delayed for several months.  Daytime behaviors improved on high dose Seroquel but continues with sundowning behaviors. Added Celexa as mood stabilizer along with Tegretol on 2/14.  Behaviors and nocturnal sleep also improved with the addition of scheduled twice daily Ativan.  Of note patient much more calm after her daughter brought up multiple items from home including a baby doll which the patient "cares for" regularly.   Subjective: Awake.  Again with conversation at this time appropriate to interaction.  Patient once again talking to people that are not.  Showed her her baby doll and she does not wish to take the baby doll onto bed with her at this time.   Objective: Vitals:  05/05/20 2055 05/06/20 0431  BP: (!) 141/54 137/88  Pulse: 78 69  Resp: 16 20  Temp: 99.4 F (37.4 C) 98.7 F (37.1 C)  SpO2: 100% 100%    Intake/Output Summary (Last 24 hours) at 05/06/2020 4098 Last data filed at 05/06/2020 0600 Gross per 24 hour  Intake 720 ml  Output 0 ml  Net 720 ml   Filed Weights   04/20/20 2203 05/01/20 0625 05/05/20 2100  Weight: 57.5 kg 59.8 kg 59 kg    Exam:  Constitutional: Alert, calm although repeatedly conversing with individuals not present in the room.  No acute distress Respiratory: Lungs are clear to auscultation.  She is stable on room air Cardiovascular: Heart sounds are S1-S2, no resting tachycardia.  No  peripheral edema  Abdomen:  LBM 3/20, soft nontender.  Variable oral intake Neurologic: CN 2-12 grossly intact.  Exam nonfocal. Psychiatric: Awake and oriented to name only.  Currently has pleasant affect but at times can become agitated without any aggressive behavior such as hitting or spitting.  Nurses are redirecting patient and using nonpharmacological behavioral interventions.  Patient typically responds to the baby doll that is present in her room noting this to be a calming influence.     Assessment/Plan: Acute problems: Dementia with behavioral disturbance Behaviors significantly improved improved.  No documented behaviors that would warrant as needed antipsychotics therefore as needed Haldol and as needed Seroquel discontinued. Continue Seroquel-QTC 2/14 <500; 3/3 Qtc 420 ms Continue Tegretol 400 mg 8 am and 2 pm and 400 mg 6 pm. 3/5 LFTs repeated and are within normal limits-we will check LFTs again in a.m. on 3/15.  Patient is on max dose of Tegretol for a patient of her age so no room to increase Vistaril without any significant impact on behavior so was discontinued in favor of Ativan Continue Ativan to 3 times daily Continue Celexa 40 mg daily Continue gabapentin 2/14 reevaluated by PT and given her status of custodial care not appropriate for acute PT.  Was also evaluated by OT and patient did not have comprehension to participate with OT sessions and was actually actively hallucinating talking on the phone with her cousin therefore no acute OT services indicated as well.  Pressure injury Right anterior hip/Stage 2 -Continue air mattress -Maximize nutrition (see below) and encourage mobility (OOB to chair/ambulate TID) Pressure Injury 04/01/20 Hip Anterior;Right Stage 2 -  Partial thickness loss of dermis presenting as a shallow open injury with a red, pink wound bed without slough. (Active)  Date First Assessed/Time First Assessed: 04/01/20 2230   Location: Hip  Location  Orientation: Anterior;Right  Staging: Stage 2 -  Partial thickness loss of dermis presenting as a shallow open injury with a red, pink wound bed without slough.  Present ...    Assessments 04/01/2020 10:05 PM 05/05/2020  9:35 AM  Dressing Type Foam - Lift dressing to assess site every shift --  Dressing Clean;Dry;Intact --  Site / Wound Assessment Pink --  Wound Length (cm) -- 0 cm  Wound Width (cm) -- 0 cm  Wound Depth (cm) -- 0 cm  Wound Surface Area (cm^2) -- 0 cm^2  Wound Volume (cm^3) -- 0 cm^3  Drainage Amount None --     No Linked orders to display   Urinary incontinence -We will try to utilize mesh panties with peripads to minimize degree of personal-care to be given i.e. need to change clothes or change bed linens from significant incontinence of urine and or stool  Other  problems: Moderate to severe protein calorie malnutrition -Continuefeeding supplements -Give ice cream or Magic cup twice daily -Albumin 2.9 on 2/14 Estimated body mass index is 20.99 kg/m as calculated from the following:   Height as of this encounter: 5\' 6"  (1.676 m).   Weight as of this encounter: 59 kg.  Transaminitis -Resolved   Data Reviewed: Basic Metabolic Panel: No results for input(s): NA, K, CL, CO2, GLUCOSE, BUN, CREATININE, CALCIUM, MG, PHOS in the last 168 hours. Liver Function Tests: No results for input(s): AST, ALT, ALKPHOS, BILITOT, PROT, ALBUMIN in the last 168 hours. No results for input(s): LIPASE, AMYLASE in the last 168 hours. No results for input(s): AMMONIA in the last 168 hours. CBC: No results for input(s): WBC, NEUTROABS, HGB, HCT, MCV, PLT in the last 168 hours. Cardiac Enzymes: No results for input(s): CKTOTAL, CKMB, CKMBINDEX, TROPONINI in the last 168 hours. BNP (last 3 results) No results for input(s): BNP in the last 8760 hours.  ProBNP (last 3 results) No results for input(s): PROBNP in the last 8760 hours.  CBG: No results for input(s): GLUCAP in the last  168 hours.  No results found for this or any previous visit (from the past 240 hour(s)).   Studies: No results found.  Scheduled Meds: . carbamazepine  400 mg Oral BID   And  . carbamazepine  400 mg Oral QPM  . citalopram  40 mg Oral Daily  . enoxaparin (LOVENOX) injection  40 mg Subcutaneous Q24H  . feeding supplement  237 mL Oral BID BM  . gabapentin  100 mg Oral BID  . LORazepam  0.5 mg Oral TID  . QUEtiapine  100 mg Oral BID  . QUEtiapine  150 mg Oral QHS   Continuous Infusions:  Principal Problem:   Dementia with behavioral disturbance (HCC) Active Problems:   Major depressive disorder, recurrent episode (HCC)   History of asthma, mild, intermittent   Early Onset Dementia with behavioral problem (HCC)   Protein-calorie malnutrition, moderate (HCC)   Transaminitis   Pressure injury of skin   Alzheimer disease (HCC)   Pressure ulcer of right hip, stage 2 (HCC)   Consultants:  Psychiatry  Procedures:  None  Antibiotics: Anti-infectives (From admission, onward)   None       Time spent: 20 minutes    ANP  [facility] 7 am - 330 pm/M-F for direct patient care and secure chat Please refer to Amion for contact info 96 days

## 2020-05-07 DIAGNOSIS — F0391 Unspecified dementia with behavioral disturbance: Secondary | ICD-10-CM | POA: Diagnosis not present

## 2020-05-07 NOTE — Progress Notes (Signed)
[facility] PROGRESS NOTE  Sherry Hicks CNO:[id number] DOB: [birth year] DOA: 01/27/2020 PCP: [name], DO       03/29/2020                        04/12/2020   Status: The patient will require care spanning > 2 midnights and should be moved to inpatient because: Altered mental status and Unsafe d/c plan -difficult to place secondary to need for skilled nursing facility secondary to severe dementia.  Has required frequent medication adjustments to manage dementia related behaviors  Dispo:               The patient is from: Home              Anticipated d/c is to: SNF              Anticipated d/c date is: > 3 days              Barriers to discharge: Still awaiting SNF bed offer-behavioral disturbances secondary to severe dementia are better controlled with appropriate medications and diversions (baby doll).  Clinicals have been faxed out to multiple facilities but still no firm bed offers as of 3/8   Difficult to place patient Yes  Level of care: Med-Surg  Code Status: Full Family Communication: 3/22 Spoke w/ dtr [name] had a GI illness-still sick w/ GI sx's and will wait until tomorrow to visit DVT prophylaxis: Ambulatory and low risk for DVT Vaccination status: Discussed with daughter [name].  She states on the whole she does not believe in vaccinations but understands that if the facility requires her mother to be vaccinated prior to injury she will allow her mother to receive a vaccination.  I did let her know that some facilities require full vaccination and that with the mRNA vaccines to be fully vaccinated takes up to 6 weeks with a 2-week waiting period after the second vaccine given.  Stated that she preferred the Pfizer vaccine and did not want her mother to have the Janssen vaccine  Foley catheter: No  HPI: 60 y.o.Fwith Alzheimer's dementia with behavioral disturbance who presented with progressively erratic behavior over months.  See longer summary  from H&P on 2/9, but in brief, patient had been living in [place] for years until family were informed by neighbors that patient was wandering, appeared unbathed, and she moved to [facility]. Here, she lived with daughter for about 2 years, but in the last few months, she started to behave more impulsively, to at times seem aggressive and have verbal outbursts, severe insomnia, and so APS recommended the patient be brought to the ER.  In the ER, the patient was seen by Psychiatry and started on new Seroquel. She was held pending geri-psych placement, which was delayed for several months.  Daytime behaviors improved on high dose Seroquel but continues with sundowning behaviors. Added Celexa as mood stabilizer along with Tegretol on 2/14.  Behaviors and nocturnal sleep also improved with the addition of scheduled twice daily Ativan.  Of note patient much more calm after her daughter brought up multiple items from home including a baby doll which the patient "cares for" regularly.   Subjective: Sleeping soundly so did not awaken.   Objective: Vitals:   05/06/20 0900 05/07/20 0556  BP: (!) 117/94 94/64  Pulse: 81 70  Resp: 20 18  Temp: 98.3 F (36.8 C) 97.7 F (36.5 C)  SpO2: 97% 98%    Intake/Output Summary (Last  24 hours) at 05/07/2020 0832 Last data filed at 05/07/2020 0610 Gross per 24 hour  Intake 1467 ml  Output 0 ml  Net 1467 ml   Filed Weights   04/20/20 2203 05/01/20 0625 05/05/20 2100  Weight: 57.5 kg 59.8 kg 59 kg    Exam:  Constitutional: Sleeping, calm, no acute distress Respiratory: Posterior lung sounds are clear and she remained stable on room air Cardiovascular: Pulses regular, skin warm and dry with adequate capillary refill, no peripheral edema Abdomen:  LBM 3/24, normoactive bowel sounds.  Variable oral intake and prefers sweets. Neurologic: CN 2-12 grossly intact.  Exam nonfocal. Psychiatric: Sleeping but at baseline oriented to name only.  Continues to talk to  people who were not in the room and argue with herself but is not aggressive or agitated.     Assessment/Plan: Acute problems: Dementia with behavioral disturbance Behaviors significantly improved improved.  No documented behaviors that would warrant as needed antipsychotics therefore as needed Haldol and as needed Seroquel discontinued. Continue Seroquel-QTC 2/14 <500; 3/3 Qtc 420 ms Continue Tegretol 400 mg 8 am and 2 pm and 400 mg 6 pm. 3/5 LFTs repeated and are within normal limits-we will check LFTs again in a.m. on 3/15.  Patient is on max dose of Tegretol for a patient of her age so no room to increase Vistaril without any significant impact on behavior so was discontinued in favor of Ativan Continue Ativan to 3 times daily Continue Celexa 40 mg daily Continue gabapentin 2/14 reevaluated by PT and given her status of custodial care not appropriate for acute PT.  Was also evaluated by OT and patient did not have comprehension to participate with OT sessions and was actually actively hallucinating talking on the phone with her cousin therefore no acute OT services indicated as well.  Pressure injury Right anterior hip/Stage 2 -Continue air mattress -Maximize nutrition (see below) and encourage mobility (OOB to chair/ambulate TID) Pressure Injury 04/01/20 Hip Anterior;Right Stage 2 -  Partial thickness loss of dermis presenting as a shallow open injury with a red, pink wound bed without slough. (Active)  Date First Assessed/Time First Assessed: 04/01/20 2230   Location: Hip  Location Orientation: Anterior;Right  Staging: Stage 2 -  Partial thickness loss of dermis presenting as a shallow open injury with a red, pink wound bed without slough.  Present ...    Assessments 04/01/2020 10:05 PM 05/06/2020  7:53 PM  Dressing Type Foam - Lift dressing to assess site every shift Foam - Lift dressing to assess site every shift  Dressing Clean;Dry;Intact Clean;Dry;Intact  Site / Wound Assessment  Pink --  Drainage Amount None --     No Linked orders to display   Urinary incontinence -We will try to utilize mesh panties with peripads to minimize degree of personal-care to be given i.e. need to change clothes or change bed linens from significant incontinence of urine and or stool  Other problems: Moderate to severe protein calorie malnutrition -Continuefeeding supplements -Give ice cream or Magic cup twice daily -Albumin 2.9 on 2/14 Estimated body mass index is 20.99 kg/m as calculated from the following:   Height as of this encounter: 5\' 6"  (1.676 m).   Weight as of this encounter: 59 kg.  Transaminitis -Resolved   Data Reviewed: Basic Metabolic Panel: No results for input(s): NA, K, CL, CO2, GLUCOSE, BUN, CREATININE, CALCIUM, MG, PHOS in the last 168 hours. Liver Function Tests: No results for input(s): AST, ALT, ALKPHOS, BILITOT, PROT, ALBUMIN in the last  168 hours. No results for input(s): LIPASE, AMYLASE in the last 168 hours. No results for input(s): AMMONIA in the last 168 hours. CBC: No results for input(s): WBC, NEUTROABS, HGB, HCT, MCV, PLT in the last 168 hours. Cardiac Enzymes: No results for input(s): CKTOTAL, CKMB, CKMBINDEX, TROPONINI in the last 168 hours. BNP (last 3 results) No results for input(s): BNP in the last 8760 hours.  ProBNP (last 3 results) No results for input(s): PROBNP in the last 8760 hours.  CBG: No results for input(s): GLUCAP in the last 168 hours.  No results found for this or any previous visit (from the past 240 hour(s)).   Studies: No results found.  Scheduled Meds: . carbamazepine  400 mg Oral BID   And  . carbamazepine  400 mg Oral QPM  . citalopram  40 mg Oral Daily  . enoxaparin (LOVENOX) injection  40 mg Subcutaneous Q24H  . feeding supplement  237 mL Oral BID BM  . gabapentin  100 mg Oral BID  . LORazepam  0.5 mg Oral TID  . QUEtiapine  100 mg Oral BID  . QUEtiapine  150 mg Oral QHS   Continuous  Infusions:  Principal Problem:   Dementia with behavioral disturbance (HCC) Active Problems:   Major depressive disorder, recurrent episode (HCC)   History of asthma, mild, intermittent   Early Onset Dementia with behavioral problem (HCC)   Protein-calorie malnutrition, moderate (HCC)   Transaminitis   Pressure injury of skin   Alzheimer disease (HCC)   Pressure ulcer of right hip, stage 2 (HCC)   Consultants:  Psychiatry  Procedures:  None  Antibiotics: Anti-infectives (From admission, onward)   None       Time spent: 20 minutes    Junious Silk ANP  [facility] 7 am - 330 pm/M-F for direct patient care and secure chat Please refer to Amion for contact info 96 days

## 2020-05-08 DIAGNOSIS — F0391 Unspecified dementia with behavioral disturbance: Secondary | ICD-10-CM | POA: Diagnosis not present

## 2020-05-08 NOTE — Progress Notes (Signed)
[facility] PROGRESS NOTE  Patient: Sherry Hicks [id number]:[id number]   PCP: [name], DO DOB: [birth year]   DOA: 01/27/2020   DOS: 05/08/2020    Subjective: No acute events overnight.  Intermittent delirium.  Objective:  Vitals:   05/07/20 0948 05/08/20 1704  BP: 111/62 (!) 155/61  Pulse: 71 86  Resp: 18 16  Temp: 97.7 F (36.5 C) 98 F (36.7 C)  SpO2: 99% 97%    Did not allow me to examine.  Assessment and plan: Dementia with behavioral disturbances. Remains stable. Continue current regimen. Room to increase gabapentin as well as Ativan. No room to increase Seroquel, Tegretol and Celexa.  Author: Lynden Oxford, MD [facility] 05/08/2020 7:36 PM   If 7PM-7AM, please contact night-coverage at [email or web address]

## 2020-05-08 NOTE — Plan of Care (Signed)
  Problem: Health Behavior/Discharge Planning: Goal: Ability to manage health-related needs will improve Outcome: Progressing   Problem: Clinical Measurements: Goal: Ability to maintain clinical measurements within normal limits will improve Outcome: Progressing   Problem: Coping: Goal: Level of anxiety will decrease Outcome: Progressing   Problem: Safety: Goal: Ability to remain free from injury will improve Outcome: Progressing   Problem: Skin Integrity: Goal: Risk for impaired skin integrity will decrease Outcome: Progressing

## 2020-05-09 NOTE — Progress Notes (Signed)
[facility] PROGRESS NOTE  Patient: Sherry Hicks OQH:[id number]   PCP: [name], DO DOB: 1961-01-23   DOA: 01/27/2020   DOS: 05/09/2020    Assessment and plan: Remains calm and composed today.  No acute complaints or acute events overnight. Continue current regimen.  Author: Lynden Oxford, MD [facility] 05/09/2020 7:22 PM   If 7PM-7AM, please contact night-coverage at [email or web address]

## 2020-05-09 NOTE — Plan of Care (Signed)
  Problem: Health Behavior/Discharge Planning: Goal: Ability to manage health-related needs will improve Outcome: Progressing   

## 2020-05-10 DIAGNOSIS — F0391 Unspecified dementia with behavioral disturbance: Secondary | ICD-10-CM | POA: Diagnosis not present

## 2020-05-10 NOTE — Plan of Care (Signed)
  Problem: Clinical Measurements: Goal: Ability to maintain clinical measurements within normal limits will improve Outcome: Completed/Met

## 2020-05-10 NOTE — Progress Notes (Signed)
[facility] PROGRESS NOTE  Sherry Hicks JQB:[id number] DOB: March 15, 1960 DOA: 01/27/2020 PCP: [name], DO       03/29/2020                        04/12/2020   Status: The patient will require care spanning > 2 midnights and should be moved to inpatient because: Altered mental status and Unsafe d/c plan -difficult to place secondary to need for skilled nursing facility secondary to severe dementia.  Has required frequent medication adjustments to manage dementia related behaviors  Dispo:               The patient is from: Home              Anticipated d/c is to: SNF              Anticipated d/c date is: > 3 days              Barriers to discharge: Still awaiting SNF bed offer-behavioral disturbances secondary to severe dementia are better controlled with appropriate medications and diversions (baby doll).  Clinicals have been faxed out to multiple facilities but still no firm bed offers as of 3/8   Difficult to place patient Yes  Level of care: Med-Surg  Code Status: Full Family Communication: 3/22 Spoke w/ dtr [name] had a GI illness-still sick w/ GI sx's and will wait until tomorrow to visit DVT prophylaxis: Ambulatory and low risk for DVT Vaccination status: Discussed with daughter [name].  She states on the whole she does not believe in vaccinations but understands that if the facility requires her mother to be vaccinated prior to injury she will allow her mother to receive a vaccination.  I did let her know that some facilities require full vaccination and that with the mRNA vaccines to be fully vaccinated takes up to 6 weeks with a 2-week waiting period after the second vaccine given.  Stated that she preferred the Pfizer vaccine and did not want her mother to have the Janssen vaccine  Foley catheter: No  HPI: 60 y.o.Fwith Alzheimer's dementia with behavioral disturbance who presented with progressively erratic behavior over months.  See longer summary  from H&P on 2/9, but in brief, patient had been living in [place] for years until family were informed by neighbors that patient was wandering, appeared unbathed, and she moved to [facility]. Here, she lived with daughter for about 2 years, but in the last few months, she started to behave more impulsively, to at times seem aggressive and have verbal outbursts, severe insomnia, and so APS recommended the patient be brought to the ER.  In the ER, the patient was seen by Psychiatry and started on new Seroquel. She was held pending geri-psych placement, which was delayed for several months.  Daytime behaviors improved on high dose Seroquel but continues with sundowning behaviors. Added Celexa as mood stabilizer along with Tegretol on 2/14.  Behaviors and nocturnal sleep also improved with the addition of scheduled twice daily Ativan.  Of note patient much more calm after her daughter brought up multiple items from home including a baby doll which the patient "cares for" regularly.   Subjective: Tech assisting patient with breakfast.  She is eating bacon.  No complaints verbalized patient.   Objective: Vitals:   05/09/20 2004 05/10/20 0459  BP: 120/71 115/77  Pulse: (!) 105 62  Resp: 20 18  Temp: 98.5 F (36.9 C) 98 F (36.7 C)  SpO2: 98% 100%    Intake/Output Summary (Last 24 hours) at 05/10/2020 0814 Last data filed at 05/10/2020 0600 Gross per 24 hour  Intake 420 ml  Output 0 ml  Net 420 ml   Filed Weights   04/20/20 2203 05/01/20 0625 05/05/20 2100  Weight: 57.5 kg 59.8 kg 59 kg    Exam:  Constitutional: Calm in no acute distress Respiratory: Anterior lung sounds are clear.  She remains on room air Cardiovascular: Heart sounds are normal.  Pulse is regular.  No peripheral edema. Abdomen:  LBM 3/24, eating diet when assisted.  Abdomen nontender with normoactive bowel sounds. Neurologic: CN 2-12 grossly intact.  Exam nonfocal. Psychiatric: Awake and at baseline mentation.  Oriented  to self only.  Today she is rather calm and is not hallucinating or speaking to people that are not present in her room.     Assessment/Plan: Acute problems: Dementia with behavioral disturbance Behaviors significantly improved improved.  No documented behaviors that would warrant as needed antipsychotics therefore as needed Haldol and as needed Seroquel discontinued. Continue Seroquel-QTC 2/14 <500; 3/3 Qtc 420 ms Continue Tegretol 400 mg 8 am and 2 pm and 400 mg 6 pm. 3/5 LFTs repeated and are within normal limits-we will check LFTs again in a.m. on 3/15.  Patient is on max dose of Tegretol for a patient of her age so no room to increase Vistaril without any significant impact on behavior so was discontinued in favor of Ativan Continue Ativan to 3 times daily Continue Celexa 40 mg daily Continue gabapentin 2/14 reevaluated by PT and given her status of custodial care not appropriate for acute PT.  Was also evaluated by OT and patient did not have comprehension to participate with OT sessions and was actually actively hallucinating talking on the phone with her cousin therefore no acute OT services indicated as well.  Pressure injury Right anterior hip/Stage 2 -Continue air mattress -Maximize nutrition (see below) and encourage mobility (OOB to chair/ambulate TID)   Urinary incontinence -We will try to utilize mesh panties with peripads to minimize degree of personal-care to be given i.e. need to change clothes or change bed linens from significant incontinence of urine and or stool  Other problems: Moderate to severe protein calorie malnutrition -Continuefeeding supplements -Give ice cream or Magic cup twice daily -Albumin 2.9 on 2/14 Estimated body mass index is 20.99 kg/m as calculated from the following:   Height as of this encounter: 5\' 6"  (1.676 m).   Weight as of this encounter: 59 kg.  Transaminitis -Resolved   Data Reviewed: Basic Metabolic Panel: No results for  input(s): NA, K, CL, CO2, GLUCOSE, BUN, CREATININE, CALCIUM, MG, PHOS in the last 168 hours. Liver Function Tests: No results for input(s): AST, ALT, ALKPHOS, BILITOT, PROT, ALBUMIN in the last 168 hours. No results for input(s): LIPASE, AMYLASE in the last 168 hours. No results for input(s): AMMONIA in the last 168 hours. CBC: No results for input(s): WBC, NEUTROABS, HGB, HCT, MCV, PLT in the last 168 hours. Cardiac Enzymes: No results for input(s): CKTOTAL, CKMB, CKMBINDEX, TROPONINI in the last 168 hours. BNP (last 3 results) No results for input(s): BNP in the last 8760 hours.  ProBNP (last 3 results) No results for input(s): PROBNP in the last 8760 hours.  CBG: No results for input(s): GLUCAP in the last 168 hours.  No results found for this or any previous visit (from the past 240 hour(s)).   Studies: No results found.  Scheduled Meds: . carbamazepine  400 mg Oral BID   And  . carbamazepine  400 mg Oral QPM  . citalopram  40 mg Oral Daily  . enoxaparin (LOVENOX) injection  40 mg Subcutaneous Q24H  . feeding supplement  237 mL Oral BID BM  . gabapentin  100 mg Oral BID  . LORazepam  0.5 mg Oral TID  . QUEtiapine  100 mg Oral BID  . QUEtiapine  150 mg Oral QHS   Continuous Infusions:  Principal Problem:   Dementia with behavioral disturbance (HCC) Active Problems:   Major depressive disorder, recurrent episode (HCC)   History of asthma, mild, intermittent   Early Onset Dementia with behavioral problem (HCC)   Protein-calorie malnutrition, moderate (HCC)   Transaminitis   Pressure injury of skin   Alzheimer disease (HCC)   Pressure ulcer of right hip, stage 2 (HCC)   Consultants:  Psychiatry  Procedures:  None  Antibiotics: Anti-infectives (From admission, onward)   None       Time spent: 20 minutes    Junious Silk ANP  [facility] 7 am - 330 pm/M-F for direct patient care and secure chat Please refer to Amion for contact info 96  days

## 2020-05-11 DIAGNOSIS — F0391 Unspecified dementia with behavioral disturbance: Secondary | ICD-10-CM | POA: Diagnosis not present

## 2020-05-11 MED ORDER — GABAPENTIN 300 MG PO CAPS
300.0000 mg | ORAL_CAPSULE | Freq: Two times a day (BID) | ORAL | Status: DC
  Administered 2020-05-11 – 2020-05-13 (×6): 300 mg via ORAL
  Filled 2020-05-11 (×6): qty 1

## 2020-05-11 NOTE — Progress Notes (Addendum)
[facility] PROGRESS NOTE  Sherry Hicks MEQ:[id number] DOB: January 15, 1961 DOA: 01/27/2020 PCP: [name], DO       03/29/2020                        04/12/2020   Status: The patient will require care spanning > 2 midnights and should be moved to inpatient because: Altered mental status and Unsafe d/c plan -difficult to place secondary to need for skilled nursing facility secondary to severe dementia.  Has required frequent medication adjustments to manage dementia related behaviors  Dispo:               The patient is from: Home              Anticipated d/c is to: SNF              Anticipated d/c date is: > 3 days              Barriers to discharge: Still awaiting SNF bed offer-behavioral disturbances secondary to severe dementia are better controlled with appropriate medications and diversions (baby doll).  Clinicals have been faxed out to multiple facilities but still no firm bed offers as of 3/8   Difficult to place patient Yes  Level of care: Med-Surg  Code Status: Full Family Communication: 3/22 Spoke w/ dtr [name] had a GI illness-still sick w/ GI sx's and will wait until tomorrow to visit DVT prophylaxis: Ambulatory and low risk for DVT Vaccination status: Discussed with daughter [name].  She states on the whole she does not believe in vaccinations but understands that if the facility requires her mother to be vaccinated prior to injury she will allow her mother to receive a vaccination.  I did let her know that some facilities require full vaccination and that with the mRNA vaccines to be fully vaccinated takes up to 6 weeks with a 2-week waiting period after the second vaccine given.  Stated that she preferred the Pfizer vaccine and did not want her mother to have the Janssen vaccine  Foley catheter: No  HPI: 60 y.o.Fwith Alzheimer's dementia with behavioral disturbance who presented with progressively erratic behavior over months.  See longer summary  from H&P on 2/9, but in brief, patient had been living in [place] for years until family were informed by neighbors that patient was wandering, appeared unbathed, and she moved to [facility]. Here, she lived with daughter for about 2 years, but in the last few months, she started to behave more impulsively, to at times seem aggressive and have verbal outbursts, severe insomnia, and so APS recommended the patient be brought to the ER.  In the ER, the patient was seen by Psychiatry and started on new Seroquel. She was held pending geri-psych placement, which was delayed for several months.  Daytime behaviors improved on high dose Seroquel but continues with sundowning behaviors. Added Celexa as mood stabilizer along with Tegretol on 2/14.  Behaviors and nocturnal sleep also improved with the addition of scheduled twice daily Ativan.  Of note patient much more calm after her daughter brought up multiple items from home including a baby doll which the patient "cares for" regularly.   Subjective: Awake. Not as interactive as usual. Staff at bedside to perform pericare due to recent urinary incontinence   Objective: Vitals:   05/10/20 2117 05/11/20 0514  BP: 126/81 96/67  Pulse: 89 63  Resp: 20 18  Temp: 99.6 F (37.6 C) 98.6 F (37 C)  SpO2: 100% 98%    Intake/Output Summary (Last 24 hours) at 05/11/2020 0819 Last data filed at 05/11/2020 0600 Gross per 24 hour  Intake 1191 ml  Output 0 ml  Net 1191 ml   Filed Weights   04/20/20 2203 05/01/20 0625 05/05/20 2100  Weight: 57.5 kg 59.8 kg 59 kg    Exam:  Constitutional: Awake, NAD Respiratory: CTA bilaterally, RA, no increased WOB Cardiovascular: S1S2, regular, normotensive, no peripheral edema Abdomen:  LBM 3/28, BS+, nontender, variable PO intake Neurologic: CN 2-12 grossly intact. NONAMBULATORY unless made to get OOB, MOEx4, nonfocal Psychiatric: A and O x name only. Bland affect today     Assessment/Plan: Acute  problems: Dementia with behavioral disturbance Behaviors significantly improved improved.  No documented behaviors that would warrant as needed antipsychotics therefore as needed Haldol and as needed Seroquel discontinued. Continue Seroquel-QTC 2/14 <500; 3/3 Qtc 420 ms Continue Tegretol 400 mg 8 am and 2 pm and 400 mg 6 pm. 3/5 LFTs repeated and are within normal limits-we will check LFTs again in a.m. on 3/15.  Patient is on max dose of Tegretol for a patient of her age so no room to increase Vistaril without any significant impact on behavior so was discontinued in favor of Ativan Continue Ativan to 3 times daily Continue Celexa 40 mg daily 3/29 increased gabapentin to 300 mg BID 2/14 reevaluated by PT and given her status of custodial care not appropriate for acute PT.  Was also evaluated by OT and patient did not have comprehension to participate with OT sessions and was actually actively hallucinating talking on the phone with her cousin therefore no acute OT services indicated as well.  Pressure injury Right anterior hip/Stage 2 -Continue air mattress -Maximize nutrition (see below) and encourage mobility (OOB to chair/ambulate TID)   Urinary incontinence -We will try to utilize mesh panties with peripads to minimize degree of personal-care to be given i.e. need to change clothes or change bed linens from significant incontinence of urine and or stool  Other problems: Moderate to severe protein calorie malnutrition -Continuefeeding supplements -Give ice cream or Magic cup twice daily -Albumin 2.9 on 2/14 Estimated body mass index is 20.99 kg/m as calculated from the following:   Height as of this encounter: 5\' 6"  (1.676 m).   Weight as of this encounter: 59 kg.  Transaminitis -Resolved   Data Reviewed: Basic Metabolic Panel: No results for input(s): NA, K, CL, CO2, GLUCOSE, BUN, CREATININE, CALCIUM, MG, PHOS in the last 168 hours. Liver Function Tests: No results for  input(s): AST, ALT, ALKPHOS, BILITOT, PROT, ALBUMIN in the last 168 hours. No results for input(s): LIPASE, AMYLASE in the last 168 hours. No results for input(s): AMMONIA in the last 168 hours. CBC: No results for input(s): WBC, NEUTROABS, HGB, HCT, MCV, PLT in the last 168 hours. Cardiac Enzymes: No results for input(s): CKTOTAL, CKMB, CKMBINDEX, TROPONINI in the last 168 hours. BNP (last 3 results) No results for input(s): BNP in the last 8760 hours.  ProBNP (last 3 results) No results for input(s): PROBNP in the last 8760 hours.  CBG: No results for input(s): GLUCAP in the last 168 hours.  No results found for this or any previous visit (from the past 240 hour(s)).   Studies: No results found.  Scheduled Meds: . carbamazepine  400 mg Oral BID   And  . carbamazepine  400 mg Oral QPM  . citalopram  40 mg Oral Daily  . enoxaparin (LOVENOX) injection  40 mg  Subcutaneous Q24H  . feeding supplement  237 mL Oral BID BM  . gabapentin  100 mg Oral BID  . LORazepam  0.5 mg Oral TID  . QUEtiapine  100 mg Oral BID  . QUEtiapine  150 mg Oral QHS   Continuous Infusions:  Principal Problem:   Dementia with behavioral disturbance (HCC) Active Problems:   Major depressive disorder, recurrent episode (HCC)   History of asthma, mild, intermittent   Early Onset Dementia with behavioral problem (HCC)   Protein-calorie malnutrition, moderate (HCC)   Transaminitis   Pressure injury of skin   Alzheimer disease (HCC)   Pressure ulcer of right hip, stage 2 (HCC)   Consultants:  Psychiatry  Procedures:  None  Antibiotics: Anti-infectives (From admission, onward)   None       Time spent: 20 minutes    Junious Silk ANP  [facility] 7 am - 330 pm/M-F for direct patient care and secure chat Please refer to Amion for contact info 96 days

## 2020-05-11 NOTE — Progress Notes (Signed)
Went to check the pt to see if she was soiled pt swinging at tech when trying to check her.   Vauda Salvucci, RN

## 2020-05-11 NOTE — Plan of Care (Signed)
  Problem: Activity: Goal: Risk for activity intolerance will decrease Outcome: Not Progressing   Problem: Coping: Goal: Level of anxiety will decrease Outcome: Not Progressing   

## 2020-05-12 DIAGNOSIS — F0391 Unspecified dementia with behavioral disturbance: Secondary | ICD-10-CM | POA: Diagnosis not present

## 2020-05-12 NOTE — Progress Notes (Signed)
CSW spoke with Randalyn Rhea at [facility] who states the facility cannot accommodate the patient's needs due to her being nonambulatory.   Edwin Dada, MSW, LCSW Transitions of Care  Clinical Social Worker II [phone]

## 2020-05-12 NOTE — Plan of Care (Signed)
  Problem: Safety: Goal: Ability to remain free from injury will improve Outcome: Progressing   

## 2020-05-13 DIAGNOSIS — F0391 Unspecified dementia with behavioral disturbance: Secondary | ICD-10-CM | POA: Diagnosis not present

## 2020-05-13 NOTE — Progress Notes (Signed)
[facility] PROGRESS NOTE  Sherry Hicks:[id number] DOB: November 13, 1960 DOA: 01/27/2020 PCP: Sherry Jumbo, DO       03/29/2020                        04/12/2020   Status: The patient will require care spanning > 2 midnights and should be moved to inpatient because: Altered mental status and Unsafe d/c plan -difficult to place secondary to need for skilled nursing facility secondary to severe dementia.  Has required frequent medication adjustments to manage dementia related behaviors  Dispo:               The patient is from: Home              Anticipated d/c is to: SNF              Anticipated d/c date is: > 3 days              Barriers to discharge: Still awaiting SNF bed offer-behavioral disturbances secondary to severe dementia are better controlled with appropriate medications and diversions (baby doll).  Clinicals have been faxed out to multiple facilities but still no firm bed offers as of 3/8   Difficult to place patient Yes  Level of care: Med-Surg  Code Status: Full Family Communication: 3/22 Spoke w/ dtr Sherry Hicks had a GI illness-still sick w/ GI sx's and will wait until tomorrow to visit DVT prophylaxis: Ambulatory and low risk for DVT Vaccination status: Discussed with daughter Sherry Hicks.  She states on the whole she does not believe in vaccinations but understands that if the facility requires her mother to be vaccinated prior to injury she will allow her mother to receive a vaccination.  I did let her know that some facilities require full vaccination and that with the mRNA vaccines to be fully vaccinated takes up to 6 weeks with a 2-week waiting period after the second vaccine given.  Stated that she preferred the Pfizer vaccine and did not want her mother to have the Janssen vaccine  Foley catheter: No  HPI: 60 y.o.Fwith Alzheimer's dementia with behavioral disturbance who presented with progressively erratic behavior over months.  See longer summary  from H&P on 2/9, but in brief, patient had been living in [place] for years until family were informed by neighbors that patient was wandering, appeared unbathed, and she moved to [facility]. Here, she lived with daughter for about 2 years, but in the last few months, she started to behave more impulsively, to at times seem aggressive and have verbal outbursts, severe insomnia, and so APS recommended the patient be brought to the ER.  In the ER, the patient was seen by Psychiatry and started on new Seroquel. She was held pending geri-psych placement, which was delayed for several months.  Daytime behaviors improved on high dose Seroquel but continues with sundowning behaviors. Added Celexa as mood stabilizer along with Tegretol on 2/14.  Behaviors and nocturnal sleep also improved with the addition of scheduled twice daily Ativan.  Of note patient much more calm after her daughter brought up multiple items from home including a baby doll which the patient "cares for" regularly.   Subjective: Sleeping so did not awaken   Objective: Vitals:   05/12/20 2047   BP:    Pulse: (!) 101   Resp:    Temp:    SpO2:      Intake/Output Summary (Last 24 hours) at 05/13/2020 0100 Last data filed  at 05/13/2020 0600 Gross per 24 hour  Intake 900 ml  Output 0 ml  Net 900 ml   Filed Weights   05/01/20 0625 05/05/20 2100 05/12/20 2043  Weight: 59.8 kg 59 kg 59 kg    Exam:  Constitutional: Sleeping calmly Respiratory: CTA bilaterally, RA, no increased WOB Cardiovascular: S1S2, regular, normotensive, no peripheral edema Abdomen:  LBM 3/28, BS+, nontender, variable PO intake Neurologic: CN 2-12 grossly intact on visual inspection. NONAMBULATORY unless made to get OOB, nonfocal Psychiatric: Sleeping     Assessment/Plan: Acute problems: Dementia with behavioral disturbance Behaviors significantly improved improved.  No documented behaviors that would warrant as needed antipsychotics therefore as  needed Haldol and as needed Seroquel discontinued. Continue Seroquel-QTC 2/14 <500; 3/3 Qtc 420 ms Continue Tegretol 400 mg 8 am and 2 pm and 400 mg 6 pm. 3/5 LFTs repeated and are within normal limits-we will check LFTs again in a.m. on 3/15.  Patient is on max dose of Tegretol for a patient of her age so no room to increase Vistaril without any significant impact on behavior so was discontinued in favor of Ativan Continue Ativan to 3 times daily Continue Celexa 40 mg daily 3/29 increased gabapentin to 300 mg BID 2/14 reevaluated by PT and given her status of custodial care not appropriate for acute PT.  Was also evaluated by OT and patient did not have comprehension to participate with OT sessions and was actually actively hallucinating talking on the phone with her cousin therefore no acute OT services indicated as well.  Pressure injury Right anterior hip/Stage 2 -Continue air mattress -Maximize nutrition (see below) and encourage mobility (OOB to chair/ambulate TID)   Urinary incontinence -We will try to utilize mesh panties with peripads to minimize degree of personal-care to be given i.e. need to change clothes or change bed linens from significant incontinence of urine and or stool  Other problems: Moderate to severe protein calorie malnutrition -Continuefeeding supplements -Give ice cream or Magic cup twice daily -Albumin 2.9 on 2/14 Estimated body mass index is 20.99 kg/m as calculated from the following:   Height as of this encounter: 5\' 6"  (1.676 m).   Weight as of this encounter: 59 kg.  Transaminitis -Resolved   Data Reviewed: Basic Metabolic Panel: No results for input(s): NA, K, CL, CO2, GLUCOSE, BUN, CREATININE, CALCIUM, MG, PHOS in the last 168 hours. Liver Function Tests: No results for input(s): AST, ALT, ALKPHOS, BILITOT, PROT, ALBUMIN in the last 168 hours. No results for input(s): LIPASE, AMYLASE in the last 168 hours. No results for input(s): AMMONIA in  the last 168 hours. CBC: No results for input(s): WBC, NEUTROABS, HGB, HCT, MCV, PLT in the last 168 hours. Cardiac Enzymes: No results for input(s): CKTOTAL, CKMB, CKMBINDEX, TROPONINI in the last 168 hours. BNP (last 3 results) No results for input(s): BNP in the last 8760 hours.  ProBNP (last 3 results) No results for input(s): PROBNP in the last 8760 hours.  CBG: No results for input(s): GLUCAP in the last 168 hours.  No results found for this or any previous visit (from the past 240 hour(s)).   Studies: No results found.  Scheduled Meds: . carbamazepine  400 mg Oral BID   And  . carbamazepine  400 mg Oral QPM  . citalopram  40 mg Oral Daily  . enoxaparin (LOVENOX) injection  40 mg Subcutaneous Q24H  . feeding supplement  237 mL Oral BID BM  . gabapentin  300 mg Oral BID  . LORazepam  0.5 mg Oral TID  . QUEtiapine  100 mg Oral BID  . QUEtiapine  150 mg Oral QHS   Continuous Infusions:  Principal Problem:   Dementia with behavioral disturbance (HCC) Active Problems:   Major depressive disorder, recurrent episode (HCC)   History of asthma, mild, intermittent   Early Onset Dementia with behavioral problem (HCC)   Protein-calorie malnutrition, moderate (HCC)   Transaminitis   Pressure injury of skin   Alzheimer disease (HCC)   Pressure ulcer of right hip, stage 2 (HCC)   Consultants:  Psychiatry  Procedures:  None  Antibiotics: Anti-infectives (From admission, onward)   None       Time spent: 20 minutes    Junious Silk ANP  [facility] 7 am - 330 pm/M-F for direct patient care and secure chat Please refer to Amion for contact info 106 days

## 2020-05-13 NOTE — Progress Notes (Signed)
[facility] PROGRESS NOTE  Sherry Hicks WNI:[id number] DOB: 11-29-1960 DOA: 01/27/2020 PCP: [name], DO       03/29/2020                        04/12/2020   Status: The patient will require care spanning > 2 midnights and should be moved to inpatient because: Altered mental status and Unsafe d/c plan -difficult to place secondary to need for skilled nursing facility secondary to severe dementia.  Has required frequent medication adjustments to manage dementia related behaviors  Dispo:               The patient is from: Home              Anticipated d/c is to: SNF              Anticipated d/c date is: > 3 days              Barriers to discharge: Still awaiting SNF bed offer-behavioral disturbances secondary to severe dementia are better controlled with appropriate medications and diversions (baby doll).  Clinicals have been faxed out to multiple facilities but still no firm bed offers as of 3/8   Difficult to place patient Yes  Level of care: Med-Surg  Code Status: Full Family Communication: 3/22 Spoke w/ dtr [name] had a GI illness-still sick w/ GI sx's and will wait until tomorrow to visit DVT prophylaxis: Ambulatory and low risk for DVT Vaccination status: Discussed with daughter [name].  She states on the whole she does not believe in vaccinations but understands that if the facility requires her mother to be vaccinated prior to injury she will allow her mother to receive a vaccination.  I did let her know that some facilities require full vaccination and that with the mRNA vaccines to be fully vaccinated takes up to 6 weeks with a 2-week waiting period after the second vaccine given.  Stated that she preferred the Pfizer vaccine and did not want her mother to have the Janssen vaccine  Foley catheter: No  HPI: 60 y.o.Fwith Alzheimer's dementia with behavioral disturbance who presented with progressively erratic behavior over months.  See longer summary  from H&P on 2/9, but in brief, patient had been living in [place] for years until family were informed by neighbors that patient was wandering, appeared unbathed, and she moved to [facility]. Here, she lived with daughter for about 2 years, but in the last few months, she started to behave more impulsively, to at times seem aggressive and have verbal outbursts, severe insomnia, and so APS recommended the patient be brought to the ER.  In the ER, the patient was seen by Psychiatry and started on new Seroquel. She was held pending geri-psych placement, which was delayed for several months.  Daytime behaviors improved on high dose Seroquel but continues with sundowning behaviors. Added Celexa as mood stabilizer along with Tegretol on 2/14.  Behaviors and nocturnal sleep also improved with the addition of scheduled twice daily Ativan.  Of note patient much more calm after her daughter brought up multiple items from home including a baby doll which the patient "cares for" regularly.   Subjective: Sleeping so did not awaken.  CNA at bedside.  States the past few days patient has been more cooperative with a.m. care including perineal care.  Objective: Vitals with BMI 05/13/2020 05/13/2020 05/12/2020  Height - - -  Weight - - -  BMI - - -  Systolic 101 106 -  Diastolic 59 56 -  Pulse 83 89 211    Intake/Output Summary (Last 24 hours) at 05/13/2020 0842 Last data filed at 05/13/2020 0600 Gross per 24 hour  Intake 900 ml  Output 0 ml  Net 900 ml   Filed Weights   05/01/20 0625 05/05/20 2100 05/12/20 2043  Weight: 59.8 kg 59 kg 59 kg    Exam:  Constitutional: Eating soundly and in no apparent distress Respiratory: Posterior lung sounds are clear to auscultation, stable on room air Cardiovascular: Normal heart sounds, peripheral extremities warm without edema, regular pulse without tachycardia Abdomen:  LBM 3/28, soft and nontender.  Variable oral intake with preference for sweets Neurologic: CN  2-12 grossly intact on visual inspection. NONAMBULATORY unless made to get OOB, nonfocal Psychiatric: Sleeping but at baseline oriented to name only.  No short-term memory.     Assessment/Plan: Acute problems: Dementia with behavioral disturbance Behaviors significantly improved improved.  No documented behaviors that would warrant as needed antipsychotics therefore as needed Haldol and as needed Seroquel discontinued. Continue Seroquel-QTC 2/14 <500; 3/3 Qtc 420 ms Continue Tegretol 400 mg 8 am and 2 pm and 400 mg 6 pm. 3/5 LFTs repeated and are within normal limits-we will check LFTs again in a.m. on 3/15.  Patient is on max dose of Tegretol for a patient of her age so no room to increase Vistaril without any significant impact on behavior so was discontinued in favor of Ativan Continue Ativan to 3 times daily, Celexa 40 mg daily and gabapentin 300 mg BID 2/14 reevaluated by PT/OT and given her status of custodial care not appropriate for acute PT/OT  Pressure injury Right anterior hip/Stage 2 -Continue air mattress -Maximize nutrition (see below) and encourage mobility (OOB to chair/ambulate TID)   Urinary incontinence -We will try to utilize mesh panties with peripads to minimize degree of personal-care to be given i.e. need to change clothes or change bed linens from significant incontinence of urine and or stool  Other problems: Moderate to severe protein calorie malnutrition -Continuefeeding supplements -Give ice cream or Magic cup twice daily -Albumin 2.9 on 2/14 Estimated body mass index is 20.99 kg/m as calculated from the following:   Height as of this encounter: 5\' 6"  (1.676 m).   Weight as of this encounter: 59 kg.  Transaminitis -Resolved   Data Reviewed: Basic Metabolic Panel: No results for input(s): NA, K, CL, CO2, GLUCOSE, BUN, CREATININE, CALCIUM, MG, PHOS in the last 168 hours. Liver Function Tests: No results for input(s): AST, ALT, ALKPHOS, BILITOT,  PROT, ALBUMIN in the last 168 hours. No results for input(s): LIPASE, AMYLASE in the last 168 hours. No results for input(s): AMMONIA in the last 168 hours. CBC: No results for input(s): WBC, NEUTROABS, HGB, HCT, MCV, PLT in the last 168 hours. Cardiac Enzymes: No results for input(s): CKTOTAL, CKMB, CKMBINDEX, TROPONINI in the last 168 hours. BNP (last 3 results) No results for input(s): BNP in the last 8760 hours.  ProBNP (last 3 results) No results for input(s): PROBNP in the last 8760 hours.  CBG: No results for input(s): GLUCAP in the last 168 hours.  No results found for this or any previous visit (from the past 240 hour(s)).   Studies: No results found.  Scheduled Meds: . carbamazepine  400 mg Oral BID   And  . carbamazepine  400 mg Oral QPM  . citalopram  40 mg Oral Daily  . enoxaparin (LOVENOX) injection  40 mg Subcutaneous Q24H  .  feeding supplement  237 mL Oral BID BM  . gabapentin  300 mg Oral BID  . LORazepam  0.5 mg Oral TID  . QUEtiapine  100 mg Oral BID  . QUEtiapine  150 mg Oral QHS   Continuous Infusions:  Principal Problem:   Dementia with behavioral disturbance (HCC) Active Problems:   Major depressive disorder, recurrent episode (HCC)   History of asthma, mild, intermittent   Early Onset Dementia with behavioral problem (HCC)   Protein-calorie malnutrition, moderate (HCC)   Transaminitis   Pressure injury of skin   Alzheimer disease (HCC)   Pressure ulcer of right hip, stage 2 (HCC)   Consultants:  Psychiatry  Procedures:  None  Antibiotics: Anti-infectives (From admission, onward)   None       Time spent: 20 minutes    Junious Silk ANP  [facility] 7 am - 330 pm/M-F for direct patient care and secure chat Please refer to Amion for contact info 106 days

## 2020-05-13 NOTE — Plan of Care (Signed)
°  Problem: Coping: °Goal: Level of anxiety will decrease °Outcome: Progressing °  °

## 2020-05-14 DIAGNOSIS — F0391 Unspecified dementia with behavioral disturbance: Secondary | ICD-10-CM | POA: Diagnosis not present

## 2020-05-14 MED ORDER — GABAPENTIN 400 MG PO CAPS
400.0000 mg | ORAL_CAPSULE | ORAL | Status: DC
  Administered 2020-05-14 – 2020-06-10 (×28): 400 mg via ORAL
  Filled 2020-05-14 (×2): qty 1
  Filled 2020-05-14: qty 4
  Filled 2020-05-14 (×25): qty 1

## 2020-05-14 NOTE — Progress Notes (Addendum)
CSW attempted to reach [place], Librarian, academic at [employer] without success - a voicemail was left requesting a return call.  CSW attempted to reach [employer] at [facility] without success - no voicemail option available.  Edwin Dada, MSW, LCSW Transitions of Care  Clinical Social Worker II [phone]

## 2020-05-14 NOTE — TOC Progression Note (Signed)
Transition of Care [facility]) - Progression Note    Patient Details  Name: Sherry Hicks MRN: [id number] Date of Birth: 08/18/1960  Transition of Care [facility]) CM/SW Contact  Janae Bridgeman, RN Phone Number: 05/14/2020, 12:38 PM  Clinical Narrative:    CM emailed clinicals to Trenton Gammon, MSW at [facility] to review.  CM and MSW to continue to explore possible SNF options for placement.   Expected Discharge Plan: Skilled Nursing Facility Barriers to Discharge: Continued Medical Work up,Barriers Unresolved (comment),SNF Pending bed offer  Expected Discharge Plan and Services Expected Discharge Plan: Skilled Nursing Facility In-house Referral: Clinical Social Work Discharge Planning Services: CM Consult Post Acute Care Choice: Skilled Nursing Facility Living arrangements for the past 2 months: Apartment                                       Social Determinants of Health (SDOH) Interventions    Readmission Risk Interventions Readmission Risk Prevention Plan 04/06/2020  Post Dischage Appt Complete  Medication Screening Complete  Transportation Screening Complete  Some recent data might be hidden

## 2020-05-14 NOTE — Progress Notes (Signed)
Nurse tech reported pt was being combative and yelling during ADL care this am.   Grady Mohabir, RN

## 2020-05-14 NOTE — Progress Notes (Addendum)
[facility] PROGRESS NOTE  Sherry Hicks PPI:[id number] DOB: 11-Jul-1960 DOA: 01/27/2020 PCP: Sherry Jumbo, DO       03/29/2020                        04/12/2020   Status: The patient will require care spanning > 2 midnights and should be moved to inpatient because: Altered mental status and Unsafe d/c plan -difficult to place secondary to need for skilled nursing facility secondary to severe dementia.  Has required frequent medication adjustments to manage dementia related behaviors  Dispo:               The patient is from: Home              Anticipated d/c is to: SNF              Anticipated d/c date is: > 3 days              Barriers to discharge: Still awaiting SNF bed offer-behavioral disturbances secondary to severe dementia are better controlled with appropriate medications and diversions (baby doll).  Clinicals have been faxed out to multiple facilities but still no firm bed offers as of 3/8   Difficult to place patient Yes  Level of care: Med-Surg  Code Status: Full Family Communication: 4/1 Spoke w/ dtr Sherry Hicks DVT prophylaxis: Ambulatory and low risk for DVT Vaccination status: Discussed with daughter Sherry Hicks.  She states on the whole she does not believe in vaccinations but understands that if the facility requires her mother to be vaccinated prior to injury she will allow her mother to receive a vaccination.  I did let her know that some facilities require full vaccination and that with the mRNA vaccines to be fully vaccinated takes up to 6 weeks with a 2-week waiting period after the second vaccine given.  Stated that she preferred the Pfizer vaccine and did not want her mother to have the Janssen vaccine  Foley catheter: No  HPI: 60 y.o.Fwith Alzheimer's dementia with behavioral disturbance who presented with progressively erratic behavior over months.  See longer summary from H&P on 2/9, but in brief, patient had been living in [place] for years until  family were informed by neighbors that patient was wandering, appeared unbathed, and she moved to [facility]. Here, she lived with daughter for about 2 years, but in the last few months, she started to behave more impulsively, to at times seem aggressive and have verbal outbursts, severe insomnia, and so APS recommended the patient be brought to the ER.  In the ER, the patient was seen by Psychiatry and started on new Sherry Hicks. She was held pending geri-psych placement, which was delayed for several months.  Daytime behaviors improved on high dose Sherry Hicks but continues with sundowning behaviors. Added Sherry Hicks as mood stabilizer along with Sherry Hicks on 2/14.  Behaviors and nocturnal sleep also improved with the addition of scheduled twice daily Sherry Hicks.  Of note patient much more calm after her daughter brought up multiple items from home including a baby doll which the patient "cares for" regularly.   Subjective: Sleeping so did not awaken.  Discussed with staff.  I felt she was too sleepy during the day so they held a lot of her medications.  Unfortunately by the evening she became more agitated.  Staff has seen a trend with patient becoming more agitated around 5-6 PM.  Have opted to change her Sherry Hicks to once daily with dosing at 3  PM.  Hopefully this will address her sundowning symptoms.  Objective: Vitals with BMI 05/14/2020 05/13/2020 05/13/2020  Height - - -  Weight - - -  BMI - - -  Systolic 135 123 573  Diastolic 78 96 59  Pulse 65 82 83    Intake/Output Summary (Last 24 hours) at 05/14/2020 0827 Last data filed at 05/14/2020 0556 Gross per 24 hour  Intake 537 ml  Output 0 ml  Net 537 ml   Filed Weights   05/01/20 0625 05/05/20 2100 05/12/20 2043  Weight: 59.8 kg 59 kg 59 kg    Exam:  Constitutional: Sleeping, calm, no acute distress Respiratory: Lungs are clear, room air Cardiovascular: Normal heart sounds, no peripheral edema, skin warm and dry, pulse regular Abdomen:  LBM 3/28,  soft nontender.  Bowel sounds present and normoactive. Neurologic: CN 2-12 grossly intact on visual inspection. NONAMBULATORY unless made to get OOB, nonfocal Psychiatric: Sleeping but at baseline patient oriented to name only.  Has significant short-term memory deficits secondary to known dementia.  Also has been exhibiting sundowning behaviors.     Assessment/Plan: Acute problems: Dementia with behavioral disturbance Behaviors significantly improved improved.  No documented behaviors that would warrant as needed antipsychotics therefore as needed Sherry Hicks and as needed Sherry Hicks discontinued. Continue Sherry Hicks-QTC 2/14 <500; 3/3 Qtc 420 ms Continue Sherry Hicks 400 mg 8 am and 2 pm and 400 mg 6 pm. 3/5 LFTs repeated and are within normal limits-we will check LFTs again in a.m. on 3/15.  Patient is on max dose of Sherry Hicks for a patient of her age so no room to increase 4/1 adjust Sherry Hicks dose.  Will change to 400 mg daily at 3 PM to help address sundowning symptoms. Sherry Hicks without any significant impact on behavior so was discontinued in favor of Sherry Hicks Continue Sherry Hicks to 3 times daily, Sherry Hicks 40 mg daily  2/14 reevaluated by Sherry Hicks and given her status of custodial care not appropriate for acute Sherry Hicks  Pressure injury Right anterior hip/Stage 2 -Continue air mattress -Maximize nutrition (see below) and encourage mobility (OOB to chair/ambulate TID)   Urinary incontinence -We will try to utilize mesh panties with peripads to minimize degree of personal-care to be given i.e. need to change clothes or change bed linens from significant incontinence of urine and or stool  Other problems: Moderate to severe protein calorie malnutrition -Continuefeeding supplements -Give ice cream or Sherry Hicks twice daily -Albumin 2.9 on 2/14 Estimated body mass index is 20.99 kg/m as calculated from the following:   Height as of this encounter: 5\' 6"  (1.676 m).   Weight as of this encounter: 59  kg.  Transaminitis -Resolved   Data Reviewed: Basic Metabolic Panel: No results for input(s): NA, K, CL, CO2, GLUCOSE, BUN, CREATININE, CALCIUM, MG, PHOS in the last 168 hours. Liver Function Tests: No results for input(s): AST, ALT, ALKPHOS, BILITOT, PROT, ALBUMIN in the last 168 hours. No results for input(s): LIPASE, AMYLASE in the last 168 hours. No results for input(s): AMMONIA in the last 168 hours. CBC: No results for input(s): WBC, NEUTROABS, HGB, HCT, MCV, PLT in the last 168 hours. Cardiac Enzymes: No results for input(s): CKTOTAL, CKMB, CKMBINDEX, TROPONINI in the last 168 hours. BNP (last 3 results) No results for input(s): BNP in the last 8760 hours.  ProBNP (last 3 results) No results for input(s): PROBNP in the last 8760 hours.  CBG: No results for input(s): GLUCAP in the last 168 hours.  No results found for this or any previous  visit (from the past 240 hour(s)).   Studies: No results found.  Scheduled Meds: . carbamazepine  400 mg Oral BID   And  . carbamazepine  400 mg Oral QPM  . citalopram  40 mg Oral Daily  . enoxaparin (LOVENOX) injection  40 mg Subcutaneous Q24H  . feeding supplement  237 mL Oral BID BM  . gabapentin  300 mg Oral BID  . LORazepam  0.5 mg Oral TID  . QUEtiapine  100 mg Oral BID  . QUEtiapine  150 mg Oral QHS   Continuous Infusions:  Principal Problem:   Dementia with behavioral disturbance (HCC) Active Problems:   Major depressive disorder, recurrent episode (HCC)   History of asthma, mild, intermittent   Early Onset Dementia with behavioral problem (HCC)   Protein-calorie malnutrition, moderate (HCC)   Transaminitis   Pressure injury of skin   Alzheimer disease (HCC)   Pressure ulcer of right hip, stage 2 (HCC)   Consultants:  Psychiatry  Procedures:  None  Antibiotics: Anti-infectives (From admission, onward)   None       Time spent: 20 minutes    Junious Silk ANP  [facility] 7 am - 330  pm/M-F for direct patient care and secure chat Please refer to Amion for contact info 106 days

## 2020-05-15 DIAGNOSIS — F0391 Unspecified dementia with behavioral disturbance: Secondary | ICD-10-CM | POA: Diagnosis not present

## 2020-05-15 NOTE — Progress Notes (Signed)
PROGRESS NOTE    Sherry Hicks   ZWC:[id number]  DOB: [birth year]  DOA: 01/27/2020     42  PCP: Sherry Jumbo, DO  CC: abnormal behavior  Hospital Course: Sherry Hicks is a 60 y.o. female with hx dementia, suspected psychiatric disorder NOS, and suspected substance use history who presented with progressively erratic behavior over months.  Caveat that patient has advanced cognitive impairment, and all history collected from daughter by phone and from chart.  For excellent Neurological history and assessment, please see office note from Dr. Karel Hicks from 10/14/2019.    Patient was living independently in [place] for many years until about 4-5 years ago, daughter started to notice she was more "distracted" on the phone and then 2 years ago family received reports she was behaving erratically (wandering outside, not bathing, speaking aggressively). Family brought her to [place], and she lived briefly with sister and then moved in with daughter here in [place] for the last ~18 months prior to admission.  Since being here, she has continued to have cognitive decline.  On [facility] evaluation she had a MOCA 3/30, MRI brain that showed generalized atrophy, was diagnosed with Alzheimer's with an underlying untreated psychiatric disorder in the setting of prior drug use.  Although completely sober from illicit substances obviously since moving in with her daughter, her cognitive impairment has gradually worsened in the last year to the point that she can now no longer answer any questions coherently.   Eventually, in the last few months, she was working with LCSW at her clinic for placement. Unfortunately, APS was called, and they recommended to family that she be brought to the ER, because otherwise daughter might be liable in some manner if the patient were injured or injured someone else.  Patient was brought to the ER on Jan 27, 2020.  She has been here since then, in psychiatric holding,  awaiting placement.  She has been started on Seroquel with doses adjusted as needed.   Interval History:  No events overnight. Resting this morning then babbling in usual state later in the afternoon. Could not answer any questions coherently as usual.   ROS: Review of systems not obtained due to patient factors. cognitive impairment   Assessment & Plan: Dementia with behavorial disturbance - continue seroquel, tegretol - continue ativan - continue seeking placement   Pressure injury Right anterior hip/Stage 2 -Continue air mattress -Maximize nutrition (see below) and encourage mobility (OOB to chair/ambulate TID)  Urinary incontinence -We will try to utilize mesh panties with peripads to minimize degree of personal-care to be given i.e. need to change clothes or change bed linens from significant incontinence of urine and or stool  Moderate protein calorie malnutrition  - Patient's BMI is Body mass index is 20.99 kg/m.. - Patient has the following signs/symptoms consistent with PCM: (fat loss, muscle loss, muscle wasting) - continue feeding supplements  Transaminitis - resolved   Old records reviewed in assessment of this patient  Antimicrobials:   DVT prophylaxis: enoxaparin (LOVENOX) injection 40 mg Start: 04/28/20 1245   Code Status:   Code Status: Full Code Family Communication:   Disposition Plan: Status is: Inpatient  Remains inpatient appropriate because:awaiting safe placement   Dispo:  Patient From:    Planned Disposition:    Medically stable for discharge:          Risk of unplanned readmission score: Unplanned Admission- Pilot do not use: 18.76   Objective: Blood pressure 90/69, pulse 92, temperature 97.9 F (  36.6 C), temperature source Oral, resp. rate 17, height 5\' 6"  (1.676 m), weight 59 kg, SpO2 96 %.  Examination: General appearance: awake, babbling, NAD Head: Normocephalic, without obvious abnormality, atraumatic Eyes: EOMI Lungs:  clear to auscultation bilaterally Heart: RRR, S1S2 present Abdomen: normal findings: bowel sounds normal and soft, non-tender Extremities: thin, no edema Skin: mobility and turgor normal Neurologic: contracted UE at times. Moves all 4 extremities spontaneously, does not follow commands  Consultants:     Procedures:     Data Reviewed: I have personally reviewed following labs and imaging studies No results found for this or any previous visit (from the past 24 hour(s)).  No results found for this or any previous visit (from the past 240 hour(s)).   Radiology Studies: No results found. No orders to display    Scheduled Meds: . carbamazepine  400 mg Oral BID   And  . carbamazepine  400 mg Oral QPM  . citalopram  40 mg Oral Daily  . enoxaparin (LOVENOX) injection  40 mg Subcutaneous Q24H  . feeding supplement  237 mL Oral BID BM  . gabapentin  400 mg Oral Daily  . LORazepam  0.5 mg Oral TID  . QUEtiapine  100 mg Oral BID  . QUEtiapine  150 mg Oral QHS   PRN Meds: acetaminophen Continuous Infusions:   LOS: 41 days  Time spent: Greater than 50% of the 35 minute visit was spent in counseling/coordination of care for the patient as laid out in the A&P.   , MD [facility] 05/15/2020, 3:11 PM

## 2020-05-16 DIAGNOSIS — F0391 Unspecified dementia with behavioral disturbance: Secondary | ICD-10-CM | POA: Diagnosis not present

## 2020-05-16 NOTE — Plan of Care (Signed)
  Problem: Safety: Goal: Ability to remain free from injury will improve Outcome: Progressing   

## 2020-05-16 NOTE — Progress Notes (Signed)
PROGRESS NOTE    Sherry Hicks   EGB:[id number]  DOB: 1960-11-08  DOA: 01/27/2020     43  PCP: [name], DO  CC: abnormal behavior  Hospital Course: Sherry Hicks is a 60 y.o. female with hx dementia, suspected psychiatric disorder NOS, and suspected substance use history who presented with progressively erratic behavior over months.  Caveat that patient has advanced cognitive impairment, and all history collected from daughter by phone and from chart.  For neurological history and assessment, see office note from Dr. Karel Jarvis from 10/14/2019.    Patient was living independently in [place] for many years until about 4-5 years ago, daughter started to notice she was more "distracted" on the phone and then 2 years ago family received reports she was behaving erratically (wandering outside, not bathing, speaking aggressively). Family brought her to [place], and she lived briefly with sister and then moved in with daughter here in [place] for the last ~18 months prior to admission.  Since being here, she has continued to have cognitive decline.  On [facility] evaluation she had a MOCA 3/30, MRI brain that showed generalized atrophy, was diagnosed with Alzheimer's with an underlying untreated psychiatric disorder in the setting of prior drug use.  Although completely sober from illicit substances obviously since moving in with her daughter, her cognitive impairment has gradually worsened in the last year to the point that she can now no longer answer any questions coherently.   Eventually, in the last few months, she was working with LCSW at her clinic for placement. Unfortunately, APS was called, and they recommended to family that she be brought to the ER, because otherwise daughter might be liable in some manner if the patient were injured or injured someone else.  Patient was brought to the ER on Jan 27, 2020.  She has been here since then, in psychiatric holding, awaiting  placement.  She has been started on Seroquel with doses adjusted as needed.   Interval History:  No events overnight.  Resting comfortably in bed this morning.  She was noted to be in her usual babbling state yesterday late afternoon and again later this morning when passing by her room intermittently.  ROS: Review of systems not obtained due to patient factors. cognitive impairment   Assessment & Plan: Dementia with behavorial disturbance - continue seroquel, tegretol - continue ativan - continue seeking placement   Pressure injury Right anterior hip/Stage 2 -Continue air mattress -Maximize nutrition (see below) and encourage mobility (OOB to chair/ambulate TID)  Urinary incontinence -We will try to utilize mesh panties with peripads to minimize degree of personal-care to be given i.e. need to change clothes or change bed linens from significant incontinence of urine and or stool  Moderate protein calorie malnutrition  - Patient's BMI is Body mass index is 20.99 kg/m.. - Patient has the following signs/symptoms consistent with PCM: (fat loss, muscle loss, muscle wasting) - continue feeding supplements  Transaminitis - resolved   Old records reviewed in assessment of this patient  Antimicrobials:   DVT prophylaxis: enoxaparin (LOVENOX) injection 40 mg Start: 04/28/20 1245   Code Status:   Code Status: Full Code Family Communication:   Disposition Plan: Status is: Inpatient  Remains inpatient appropriate because:awaiting safe placement  Dispo:  Patient From:    Planned Disposition:    Medically stable for discharge:       Risk of unplanned readmission score: Unplanned Admission- Pilot do not use: 18.76   Objective: Blood pressure 90/69,  pulse 92, temperature 97.9 F (36.6 C), temperature source Oral, resp. rate 17, height 5\' 6"  (1.676 m), weight 59 kg, SpO2 96 %.  Examination: General appearance: awake, babbling, NAD Head: Normocephalic, without obvious  abnormality, atraumatic Eyes: EOMI Lungs: clear to auscultation bilaterally Heart: RRR, S1S2 present Abdomen: normal findings: bowel sounds normal and soft, non-tender Extremities: thin, no edema Skin: mobility and turgor normal Neurologic: contracted UE at times. Moves all 4 extremities spontaneously, does not follow commands  Consultants:     Procedures:     Data Reviewed: I have personally reviewed following labs and imaging studies No results found for this or any previous visit (from the past 24 hour(s)).  No results found for this or any previous visit (from the past 240 hour(s)).   Radiology Studies: No results found. No orders to display    Scheduled Meds: . carbamazepine  400 mg Oral BID   And  . carbamazepine  400 mg Oral QPM  . citalopram  40 mg Oral Daily  . enoxaparin (LOVENOX) injection  40 mg Subcutaneous Q24H  . feeding supplement  237 mL Oral BID BM  . gabapentin  400 mg Oral Daily  . LORazepam  0.5 mg Oral TID  . QUEtiapine  100 mg Oral BID  . QUEtiapine  150 mg Oral QHS   PRN Meds: acetaminophen Continuous Infusions:   LOS: 42 days  Time spent: Greater than 50% of the 35 minute visit was spent in counseling/coordination of care for the patient as laid out in the A&P.   , MD [facility] 05/16/2020, 12:46 PM

## 2020-05-17 DIAGNOSIS — F0391 Unspecified dementia with behavioral disturbance: Secondary | ICD-10-CM | POA: Diagnosis not present

## 2020-05-17 MED ORDER — LORAZEPAM 0.5 MG PO TABS
0.5000 mg | ORAL_TABLET | Freq: Two times a day (BID) | ORAL | Status: DC
  Administered 2020-05-17 – 2020-06-10 (×50): 0.5 mg via ORAL
  Filled 2020-05-17 (×51): qty 1

## 2020-05-17 NOTE — Progress Notes (Signed)
[facility] PROGRESS NOTE  Sherry Hicks KGY:[id number] DOB: [birth year] DOA: 01/27/2020 PCP: [name], DO       03/29/2020                        04/12/2020   Status: The patient will require care spanning > 2 midnights and should be moved to inpatient because: Altered mental status and Unsafe d/c plan -difficult to place secondary to need for skilled nursing facility secondary to severe dementia.  Has required frequent medication adjustments to manage dementia related behaviors  Dispo:               The patient is from: Home              Anticipated d/c is to: SNF              Anticipated d/c date is: > 3 days              Barriers to discharge: Still awaiting SNF bed offer-behavioral disturbances secondary to severe dementia are better controlled with appropriate medications and diversions (baby doll).  Clinicals have been faxed out to multiple facilities but still no firm bed offers as of 3/8   Difficult to place patient Yes  Level of care: Med-Surg  Code Status: Full Family Communication: 4/1 Spoke w/ dtr [name] DVT prophylaxis: Ambulatory and low risk for DVT Vaccination status: Discussed with daughter [name].  She states on the whole she does not believe in vaccinations but understands that if the facility requires her mother to be vaccinated prior to injury she will allow her mother to receive a vaccination.  I did let her know that some facilities require full vaccination and that with the mRNA vaccines to be fully vaccinated takes up to 6 weeks with a 2-week waiting period after the second vaccine given.  Stated that she preferred the Pfizer vaccine and did not want her mother to have the Janssen vaccine  Foley catheter: No  HPI: 60 y.o.Fwith Alzheimer's dementia with behavioral disturbance who presented with progressively erratic behavior over months.  See longer summary from H&P on 2/9, but in brief, patient had been living in [place] for years until  family were informed by neighbors that patient was wandering, appeared unbathed, and she moved to [facility]. Here, she lived with daughter for about 2 years, but in the last few months, she started to behave more impulsively, to at times seem aggressive and have verbal outbursts, severe insomnia, and so APS recommended the patient be brought to the ER.  In the ER, the patient was seen by Psychiatry and started on new Seroquel. She was held pending geri-psych placement, which was delayed for several months.  Daytime behaviors improved on high dose Seroquel but continues with sundowning behaviors. Added Celexa as mood stabilizer along with Tegretol on 2/14.  Behaviors and nocturnal sleep also improved with the addition of scheduled twice daily Ativan.  Of note patient much more calm after her daughter brought up multiple items from home including a baby doll which the patient "cares for" regularly.   Subjective: Nursing reports increased daytime sleepiness-plan is to decrease frequency of Ativan  Objective: Vitals with BMI 05/14/2020 05/14/2020 05/14/2020  Height - - -  Weight - - -  BMI - - -  Systolic 90 124 135  Diastolic 69 88 78  Pulse 92 66 65    Intake/Output Summary (Last 24 hours) at 05/17/2020 0753 Last data filed at  05/17/2020 0600 Gross per 24 hour  Intake 897 ml  Output 0 ml  Net 897 ml   Filed Weights   05/01/20 0625 05/05/20 2100 05/12/20 2043  Weight: 59.8 kg 59 kg 59 kg    Exam:  Constitutional: Sleeping, NAD Respiratory: CTA, RA Cardiovascular: S1S2, regular, no tachycardia or edema Abdomen:  LBM 4/1. BS+, eating incnosistently Neurologic: CN 2-12 grossly intact on visual inspection. NONAMBULATORY unless made to get OOB, nonfocal Psychiatric: Sleeping soundly     Assessment/Plan: Acute problems: Dementia with behavioral disturbance Behaviors significantly improved improved.  No documented behaviors that would warrant as needed antipsychotics therefore as needed Haldol  and as needed Seroquel discontinued. Continue Seroquel-QTC 2/14 <500; 3/3 Qtc 420 ms Continue Tegretol 400 mg 8 am and 2 pm and 400 mg 6 pm. 3/5 LFTs repeated and are within normal limits-we will check LFTs again in a.m. on 3/15.  Patient is on max dose of Tegretol for a patient of her age so no room to increase 4/1 adjust Neurontin dose.  Will change to 400 mg daily at 3 PM to help address sundowning symptoms. Vistaril without any significant impact on behavior so was discontinued in favor of Ativan Decrease Ativan to BID-cont Celexa 40 mg daily  2/14 reevaluated by PT/OT and given her status of custodial care not appropriate for acute PT/OT  Pressure injury Right anterior hip/Stage 2 -Continue air mattress -Maximize nutrition (see below) and encourage mobility (OOB to chair/ambulate TID)   Urinary incontinence -We will try to utilize mesh panties with peripads to minimize degree of personal-care to be given i.e. need to change clothes or change bed linens from significant incontinence of urine and or stool  Other problems: Moderate to severe protein calorie malnutrition -Continuefeeding supplements -Give ice cream or Magic cup twice daily -Albumin 2.9 on 2/14 Estimated body mass index is 20.99 kg/m as calculated from the following:   Height as of this encounter: 5\' 6"  (1.676 m).   Weight as of this encounter: 59 kg.  Transaminitis -Resolved   Data Reviewed: Basic Metabolic Panel: No results for input(s): NA, K, CL, CO2, GLUCOSE, BUN, CREATININE, CALCIUM, MG, PHOS in the last 168 hours. Liver Function Tests: No results for input(s): AST, ALT, ALKPHOS, BILITOT, PROT, ALBUMIN in the last 168 hours. No results for input(s): LIPASE, AMYLASE in the last 168 hours. No results for input(s): AMMONIA in the last 168 hours. CBC: No results for input(s): WBC, NEUTROABS, HGB, HCT, MCV, PLT in the last 168 hours. Cardiac Enzymes: No results for input(s): CKTOTAL, CKMB, CKMBINDEX,  TROPONINI in the last 168 hours. BNP (last 3 results) No results for input(s): BNP in the last 8760 hours.  ProBNP (last 3 results) No results for input(s): PROBNP in the last 8760 hours.  CBG: No results for input(s): GLUCAP in the last 168 hours.  No results found for this or any previous visit (from the past 240 hour(s)).   Studies: No results found.  Scheduled Meds: . carbamazepine  400 mg Oral BID   And  . carbamazepine  400 mg Oral QPM  . citalopram  40 mg Oral Daily  . enoxaparin (LOVENOX) injection  40 mg Subcutaneous Q24H  . feeding supplement  237 mL Oral BID BM  . gabapentin  400 mg Oral Daily  . LORazepam  0.5 mg Oral TID  . QUEtiapine  100 mg Oral BID  . QUEtiapine  150 mg Oral QHS   Continuous Infusions:  Principal Problem:   Dementia with behavioral  disturbance (HCC) Active Problems:   Major depressive disorder, recurrent episode (HCC)   History of asthma, mild, intermittent   Early Onset Dementia with behavioral problem (HCC)   Protein-calorie malnutrition, moderate (HCC)   Transaminitis   Pressure injury of skin   Alzheimer disease (HCC)   Pressure ulcer of right hip, stage 2 (HCC)   Consultants:  Psychiatry  Procedures:  None  Antibiotics: Anti-infectives (From admission, onward)   None       Time spent: 20 minutes    Junious Silk ANP  [facility] 7 am - 330 pm/M-F for direct patient care and secure chat Please refer to Amion for contact info 106 days

## 2020-05-17 NOTE — TOC Progression Note (Signed)
Transition of Care [facility]) - Progression Note    Patient Details  Name: Sherry Hicks MRN: [id number] Date of Birth: Jul 24, 1960  [facility]) CM/SW Contact  Janae Bridgeman, RN Phone Number: 05/17/2020, 10:41 AM  Clinical Narrative:    Case management called and left a message with Trenton Gammon, MSW to explore admission opportunity at any of the available assisted living facilities in area.  MSW and CM will continue to follow the patient for SNF versus ALF facilities available for admission.   Expected Discharge Plan: Skilled Nursing Facility Barriers to Discharge: Continued Medical Work up,Barriers Unresolved (comment),SNF Pending bed offer  Expected Discharge Plan and Services Expected Discharge Plan: Skilled Nursing Facility In-house Referral: Clinical Social Work Discharge Planning Services: CM Consult Post Acute Care Choice: Skilled Nursing Facility Living arrangements for the past 2 months: Apartment                                       Social Determinants of Health (SDOH) Interventions    Readmission Risk Interventions Readmission Risk Prevention Plan 04/06/2020  Post Dischage Appt Complete  Medication Screening Complete  Transportation Screening Complete  Some recent data might be hidden

## 2020-05-18 DIAGNOSIS — F0391 Unspecified dementia with behavioral disturbance: Secondary | ICD-10-CM | POA: Diagnosis not present

## 2020-05-18 NOTE — Progress Notes (Signed)
[facility] PROGRESS NOTE  CHALISE PE DHW:[id number] DOB: [birth year] DOA: 01/27/2020 PCP: [name], DO       03/29/2020                        04/12/2020   Status: The patient will require care spanning > 2 midnights and should be moved to inpatient because: Altered mental status and Unsafe d/c plan -difficult to place secondary to need for skilled nursing facility secondary to severe dementia.  Has required frequent medication adjustments to manage dementia related behaviors  Dispo:               The patient is from: Home              Anticipated d/c is to: SNF              Anticipated d/c date is: > 3 days              Barriers to discharge: Still awaiting SNF bed offer-behavioral disturbances secondary to severe dementia are better controlled with appropriate medications and diversions (baby doll).  Clinicals have been faxed out to multiple facilities but still no firm bed offers as of 3/8   Difficult to place patient Yes  Level of care: Med-Surg  Code Status: Full Family Communication: 4/1 Spoke w/ dtr Sherry Hicks DVT prophylaxis: Ambulatory and low risk for DVT Vaccination status: Discussed with daughter Sherry Hicks.  She states on the whole she does not believe in vaccinations but understands that if the facility requires her mother to be vaccinated prior to injury she will allow her mother to receive a vaccination.  I did let her know that some facilities require full vaccination and that with the mRNA vaccines to be fully vaccinated takes up to 6 weeks with a 2-week waiting period after the second vaccine given.  Stated that she preferred the Pfizer vaccine and did not want her mother to have the Janssen vaccine  Foley catheter: No  HPI: 60 y.o.Fwith Alzheimer's dementia with behavioral disturbance who presented with progressively erratic behavior over months.  See longer summary from H&P on 2/9, but in brief, patient had been living in [place] for years until  family were informed by neighbors that patient was wandering, appeared unbathed, and she moved to [facility]. Here, she lived with daughter for about 2 years, but in the last few months, she started to behave more impulsively, to at times seem aggressive and have verbal outbursts, severe insomnia, and so APS recommended the patient be brought to the ER.  In the ER, the patient was seen by Psychiatry and started on new Seroquel. She was held pending geri-psych placement, which was delayed for several months.  Daytime behaviors improved on high dose Seroquel but continues with sundowning behaviors. Added Celexa as mood stabilizer along with Tegretol on 2/14.  Behaviors and nocturnal sleep also improved with the addition of scheduled twice daily Ativan.  Of note patient much more calm after her daughter brought up multiple items from home including a baby doll which the patient "cares for" regularly.   Subjective: Patient is alert and active very vulvitis mornings.  She is having repetitive nonsensical speech.  No aggressive behaviors.  Objective: Vitals with BMI 05/17/2020 05/14/2020 05/14/2020  Height - - -  Weight - - -  BMI - - -  Systolic 121 90 124  Diastolic 78 69 88  Pulse 89 92 66    Intake/Output Summary (Last 24  hours) at 05/18/2020 0829 Last data filed at 05/17/2020 2330 Gross per 24 hour  Intake 897 ml  Output --  Net 897 ml   Filed Weights   05/01/20 0625 05/05/20 2100 05/12/20 2043  Weight: 59.8 kg 59 kg 59 kg    Exam:  Constitutional: Alert and no acute distress Respiratory: Anterior lung sounds are clear, room air Cardiovascular: S1-S2, no peripheral edema, pulse regular and nontachycardic Abdomen:  LBM 4/1-variable oral intake.  Normoactive bowel sounds, nontender upon exam Neurologic: CN 2-12 grossly intact on visual inspection. nonfocal Psychiatric: Alert and only oriented times name.  Pleasant affect when spoken to.  Continues to have repetitive speech regarding  perseveration on words, speech is nonsensical and noncohesive.     Assessment/Plan: Acute problems: Dementia with behavioral disturbance Behaviors significantly improved improved.  No documented behaviors that would warrant as needed antipsychotics therefore as needed Haldol and as needed Seroquel discontinued. Continue Seroquel-QTC 2/14 <500; 3/3 Qtc 420 ms Continue Tegretol 400 mg 8 am and 2 pm and 400 mg 6 pm. 3/5 LFTs repeated and are within normal limits-we will check LFTs again in a.m. on 3/15.  Patient is on max dose of Tegretol for a patient of her age so no room to increase 4/1 adjust Neurontin dose.  Will change to 400 mg daily at 3 PM to help address sundowning symptoms. Vistaril without any significant impact on behavior so was discontinued in favor of Ativan 4/4 decreased Ativan to BID with improvement of alertness-cont Celexa 40 mg daily  2/14 reevaluated by PT/OT and given her status of custodial care not appropriate for acute PT/OT  Pressure injury Right anterior hip/Stage 2 -Continue air mattress -Maximize nutrition (see below) and encourage mobility (OOB to chair/ambulate TID) Pressure Injury 05/17/20 Left Stage 2 -  Partial thickness loss of dermis presenting as a shallow open injury with a red, pink wound bed without slough. (Active)  Date First Assessed/Time First Assessed: 05/17/20 1236   Location Orientation: Left  Staging: Stage 2 -  Partial thickness loss of dermis presenting as a shallow open injury with a red, pink wound bed without slough.    Assessments 05/17/2020 12:36 PM 05/17/2020  8:00 PM  Dressing Type Foam - Lift dressing to assess site every shift Foam - Lift dressing to assess site every shift     No Linked orders to display   Urinary incontinence -We will try to utilize mesh panties with peripads to minimize degree of personal-care to be given i.e. need to change clothes or change bed linens from significant incontinence of urine and or stool  Other  problems: Moderate to severe protein calorie malnutrition -Continuefeeding supplements -Give ice cream or Magic cup twice daily -Albumin 2.9 on 2/14 Estimated body mass index is 20.99 kg/m as calculated from the following:   Height as of this encounter: 5\' 6"  (1.676 m).   Weight as of this encounter: 59 kg.  Transaminitis -Resolved   Data Reviewed: Basic Metabolic Panel: No results for input(s): NA, K, CL, CO2, GLUCOSE, BUN, CREATININE, CALCIUM, MG, PHOS in the last 168 hours. Liver Function Tests: No results for input(s): AST, ALT, ALKPHOS, BILITOT, PROT, ALBUMIN in the last 168 hours. No results for input(s): LIPASE, AMYLASE in the last 168 hours. No results for input(s): AMMONIA in the last 168 hours. CBC: No results for input(s): WBC, NEUTROABS, HGB, HCT, MCV, PLT in the last 168 hours. Cardiac Enzymes: No results for input(s): CKTOTAL, CKMB, CKMBINDEX, TROPONINI in the last 168 hours. BNP (  last 3 results) No results for input(s): BNP in the last 8760 hours.  ProBNP (last 3 results) No results for input(s): PROBNP in the last 8760 hours.  CBG: No results for input(s): GLUCAP in the last 168 hours.  No results found for this or any previous visit (from the past 240 hour(s)).   Studies: No results found.  Scheduled Meds: . carbamazepine  400 mg Oral BID   And  . carbamazepine  400 mg Oral QPM  . citalopram  40 mg Oral Daily  . enoxaparin (LOVENOX) injection  40 mg Subcutaneous Q24H  . feeding supplement  237 mL Oral BID BM  . gabapentin  400 mg Oral Daily  . LORazepam  0.5 mg Oral BID  . QUEtiapine  100 mg Oral BID  . QUEtiapine  150 mg Oral QHS   Continuous Infusions:  Principal Problem:   Dementia with behavioral disturbance (HCC) Active Problems:   Major depressive disorder, recurrent episode (HCC)   History of asthma, mild, intermittent   Early Onset Dementia with behavioral problem (HCC)   Protein-calorie malnutrition, moderate (HCC)    Transaminitis   Pressure injury of skin   Alzheimer disease (HCC)   Pressure ulcer of right hip, stage 2 (HCC)   Consultants:  Psychiatry  Procedures:  None  Antibiotics: Anti-infectives (From admission, onward)   None       Time spent: 20 minutes    Junious Silk ANP  [facility] 7 am - 330 pm/M-F for direct patient care and secure chat Please refer to Amion for contact info 106 days

## 2020-05-18 NOTE — Plan of Care (Signed)
  Problem: Safety: Goal: Ability to remain free from injury will improve Outcome: Progressing   

## 2020-05-18 NOTE — TOC Progression Note (Signed)
Transition of Care [facility]) - Progression Note    Patient Details  Name: CHRISTNE PLATTS MRN: [id number] Date of Birth: 05-02-1960  Transition [facility]) CM/SW Contact  Janae Bridgeman, RN Phone Number: 05/18/2020, 2:50 PM  Clinical Narrative:    Patient currently has no bed offers for [facility] placement.  I faxed the patient out to other local SNF facilities including [facility], [facility], [facility] in [place], [facility], [facility], [place], [place], [facility] [place], and [place], and [facility] in [place].  CM and MSW will continue to follow the patient for SNF placement at an accepting facility.   Expected Discharge Plan: Skilled Nursing Facility Barriers to Discharge: Continued Medical Work up,Barriers Unresolved (comment),SNF Pending bed offer  Expected Discharge Plan and Services Expected Discharge Plan: Skilled Nursing Facility In-house Referral: Clinical Social Work Discharge Planning Services: CM Consult Post Acute Care Choice: Skilled Nursing Facility Living arrangements for the past 2 months: Apartment                                       Social Determinants of Health (SDOH) Interventions    Readmission Risk Interventions Readmission Risk Prevention Plan 04/06/2020  Post Dischage Appt Complete  Medication Screening Complete  Transportation Screening Complete  Some recent data might be hidden

## 2020-05-19 DIAGNOSIS — F0391 Unspecified dementia with behavioral disturbance: Secondary | ICD-10-CM | POA: Diagnosis not present

## 2020-05-19 NOTE — Progress Notes (Signed)
Patient seen and examined this morning.  I have discussed plan of care with the nurse practitioner Allison-please refer to her note for more detail.  Briefly 60 year old female with history of dementia suspected psychiatric disorder NOS, suspected substance abuse presented with progressively erratic behavior over months. She is currently hospitalized seen by psychiatry and remains confused even with dementia, and has had prolonged hospitalization  Patient is alert, awake and oriented to name only.  issues Dementia with behavioral disturbance, on high dose Seroquel Tegretol, Ativan, Celexa and Neurontin, with close monitoring of QTC.  On fall precaution. Urinary incontinence Moderate protein calorie malnutrition Transaminitis resolved Pressure injury RT ANT HIP STAGE 2  At this time patient is waiting on placement to SNF.  She is overall stable medically.

## 2020-05-19 NOTE — Progress Notes (Signed)
[facility] PROGRESS NOTE  Sherry Hicks ZOX:[id number] DOB: [birth year] DOA: 01/27/2020 PCP: [name], DO       03/29/2020                        04/12/2020   Status: The patient will require care spanning > 2 midnights and should be moved to inpatient because: Altered mental status and Unsafe d/c plan -difficult to place secondary to need for skilled nursing facility secondary to severe dementia.  Has required frequent medication adjustments to manage dementia related behaviors  Dispo:               The patient is from: Home              Anticipated d/c is to: SNF              Anticipated d/c date is: > 3 days              Barriers to discharge: Still awaiting SNF bed offer-behavioral disturbances secondary to severe dementia are better controlled with appropriate medications and diversions (baby doll).  Clinicals have been faxed out to multiple facilities but still no firm bed offers as of 3/8   Difficult to place patient Yes  Level of care: Med-Surg  Code Status: Full Family Communication: 4/1 Spoke w/ dtr [name] DVT prophylaxis: Ambulatory and low risk for DVT Vaccination status: Discussed with daughter [name].  She states on the whole she does not believe in vaccinations but understands that if the facility requires her mother to be vaccinated prior to injury she will allow her mother to receive a vaccination.  I did let her know that some facilities require full vaccination and that with the mRNA vaccines to be fully vaccinated takes up to 6 weeks with a 2-week waiting period after the second vaccine given.  Stated that she preferred the Pfizer vaccine and did not want her mother to have the Janssen vaccine  Foley catheter: No  HPI: 60 y.o.Fwith Alzheimer's dementia with behavioral disturbance who presented with progressively erratic behavior over months.  See longer summary from H&P on 2/9, but in brief, patient had been living in [place] for years until  family were informed by neighbors that patient was wandering, appeared unbathed, and she moved to [facility]. Here, she lived with daughter for about 2 years, but in the last few months, she started to behave more impulsively, to at times seem aggressive and have verbal outbursts, severe insomnia, and so APS recommended the patient be brought to the ER.  In the ER, the patient was seen by Psychiatry and started on new Seroquel. She was held pending geri-psych placement, which was delayed for several months.  Daytime behaviors improved on high dose Seroquel but continues with sundowning behaviors. Added Celexa as mood stabilizer along with Tegretol on 2/14.  Behaviors and nocturnal sleep also improved with the addition of scheduled twice daily Ativan.  Of note patient much more calm after her daughter brought up multiple items from home including a baby doll which the patient "cares for" regularly.   Subjective: Awake.Talking to people not in the room  Objective: Vitals with BMI 05/18/2020 05/17/2020 05/14/2020  Height - - -  Weight - - -  BMI - - -  Systolic 110 121 90  Diastolic 68 78 69  Pulse 76 89 92    Intake/Output Summary (Last 24 hours) at 05/19/2020 0804 Last data filed at 05/18/2020 2300 Gross per  24 hour  Intake 1374 ml  Output --  Net 1374 ml   Filed Weights   05/01/20 0625 05/05/20 2100 05/12/20 2043  Weight: 59.8 kg 59 kg 59 kg    Exam:  Constitutional: Awake, NAD Respiratory:CTA, RA Cardiovascular: S1S2, pulse regular, no edema Abdomen:  LBM 4/1-BS+, nontender and nondistended Neurologic: CN 2-12 grossly intact on visual inspection. nonfocal Psychiatric: Awake, oriented x name only.      Assessment/Plan: Acute problems: Dementia with behavioral disturbance Continue Seroquel-QTC 2/14 <500; 3/3 Qtc 420 ms Continue Tegretol 400 mg 8 am and 2 pm and 400 mg 6 pm. 3/5 LFTs repeated and are within normal limits-we will check LFTs again in a.m. on 3/15.  Patient is on max dose  of Tegretol for a patient of her age so no room to increase 4/1 adjust Neurontin dose.  Will change to 400 mg daily at 3 PM to help address sundowning symptoms. Vistaril without any significant impact on behavior so was discontinued in favor of Ativan 4/4 decreased Ativan to BID with improvement of alertness-cont Celexa 40 mg daily  2/14 reevaluated by PT/OT and given her status of custodial care not appropriate for acute PT/OT  Pressure injury Right anterior hip/Stage 2 -Continue air mattress -Maximize nutrition  Pressure Injury 05/17/20 Left Stage 2 -  Partial thickness loss of dermis presenting as a shallow open injury with a red, pink wound bed without slough. (Active)  Date First Assessed/Time First Assessed: 05/17/20 1236   Location Orientation: Left  Staging: Stage 2 -  Partial thickness loss of dermis presenting as a shallow open injury with a red, pink wound bed without slough.    Assessments 05/17/2020 12:36 PM 05/18/2020  8:00 PM  Dressing Type Foam - Lift dressing to assess site every shift Foam - Lift dressing to assess site every shift     No Linked orders to display   Urinary incontinence -Continue regular pericare  Other problems: Moderate to severe protein calorie malnutrition -Continuefeeding supplements Estimated body mass index is 20.99 kg/m as calculated from the following:   Height as of this encounter: 5\' 6"  (1.676 m).   Weight as of this encounter: 59 kg.  Transaminitis -Resolved   Data Reviewed: Basic Metabolic Panel: No results for input(s): NA, K, CL, CO2, GLUCOSE, BUN, CREATININE, CALCIUM, MG, PHOS in the last 168 hours. Liver Function Tests: No results for input(s): AST, ALT, ALKPHOS, BILITOT, PROT, ALBUMIN in the last 168 hours. No results for input(s): LIPASE, AMYLASE in the last 168 hours. No results for input(s): AMMONIA in the last 168 hours. CBC: No results for input(s): WBC, NEUTROABS, HGB, HCT, MCV, PLT in the last 168 hours. Cardiac  Enzymes: No results for input(s): CKTOTAL, CKMB, CKMBINDEX, TROPONINI in the last 168 hours. BNP (last 3 results) No results for input(s): BNP in the last 8760 hours.  ProBNP (last 3 results) No results for input(s): PROBNP in the last 8760 hours.  CBG: No results for input(s): GLUCAP in the last 168 hours.  No results found for this or any previous visit (from the past 240 hour(s)).   Studies: No results found.  Scheduled Meds: . carbamazepine  400 mg Oral BID   And  . carbamazepine  400 mg Oral QPM  . citalopram  40 mg Oral Daily  . enoxaparin (LOVENOX) injection  40 mg Subcutaneous Q24H  . feeding supplement  237 mL Oral BID BM  . gabapentin  400 mg Oral Daily  . LORazepam  0.5 mg Oral  BID  . QUEtiapine  100 mg Oral BID  . QUEtiapine  150 mg Oral QHS   Continuous Infusions:  Principal Problem:   Dementia with behavioral disturbance (HCC) Active Problems:   Major depressive disorder, recurrent episode (HCC)   History of asthma, mild, intermittent   Early Onset Dementia with behavioral problem (HCC)   Protein-calorie malnutrition, moderate (HCC)   Transaminitis   Pressure injury of skin   Alzheimer disease (HCC)   Pressure ulcer of right hip, stage 2 (HCC)   Consultants:  Psychiatry  Procedures:  None  Antibiotics: Anti-infectives (From admission, onward)   None       Time spent: 20 minutes    Junious Silk ANP  [facility] 7 am - 330 pm/M-F for direct patient care and secure chat Please refer to Amion for contact info 106 days

## 2020-05-20 DIAGNOSIS — F0391 Unspecified dementia with behavioral disturbance: Secondary | ICD-10-CM | POA: Diagnosis not present

## 2020-05-20 NOTE — Progress Notes (Addendum)
2pm: CSW sent clinical information to Pierpont at [facility] for review.  11:30am: CSW attempted to reach Josh at [facility] in [place] without success - a voicemail was left requesting a return call.  CSW spoke with Jerrye Beavers at [facility] - she is requesting the referral be resent via email.  Edwin Dada, MSW, LCSW Transitions of Care  Clinical Social Worker II [phone]

## 2020-05-20 NOTE — [place] FL2 (Signed)
[place] MEDICAID FL2 LEVEL OF CARE SCREENING TOOL     IDENTIFICATION  Patient Name: Sherry Hicks Birthdate: 11/11/1960 Sex: female Admission Date (Current Location): 01/27/2020  [place] and [place] Number:  [name] [id number] [facility] Facility and Address:  The [place], [address] [place], [place] [address]      Provider Number: [id number]  Attending Physician Name and Address:  Lanae Boast, MD  Relative Name and Phone Number:  Niambi Smoak [phone]    Current Level of Care: Hospital Recommended Level of Care: Skilled Nursing [facility] Living Facility Prior Approval Number:    Date Approved/Denied: 03/26/20 PASRR Number: [id number]  Discharge Plan: Other (Comment)    Current Diagnoses: Patient Active Problem List   Diagnosis Date Noted  . Pressure ulcer of right hip, stage 2 (HCC)   . Alzheimer disease (HCC) 04/04/2020  . Pressure injury of skin 04/02/2020  . Protein-calorie malnutrition, moderate (HCC)   . Transaminitis   . Dementia with behavioral disturbance (HCC) 03/24/2020  . History of mood disorder 08/29/2019  . Apraxia 08/29/2019  . Alteration in performance of activities of daily living 08/29/2019  . Caregiver stress 08/29/2019  . Early Onset Dementia with behavioral problem (HCC)   . Hyperlipidemia 07/30/2019  . Weight loss, unintentional 07/06/2019  . Hypernatremia 07/06/2019  . History of asthma, mild, intermittent 2017  . Major depressive disorder, recurrent episode (HCC) 04/12/2006  . HYPERTENSION, BENIGN SYSTEMIC 04/12/2006    Orientation RESPIRATION BLADDER Height & Weight      (Patient has dementia)  Normal Incontinent Weight: 130 lb 1.1 oz (59 kg) Height:  5\' 6"  (167.6 cm)  BEHAVIORAL SYMPTOMS/MOOD NEUROLOGICAL BOWEL NUTRITION STATUS      Incontinent Diet  AMBULATORY STATUS COMMUNICATION OF NEEDS Skin   Extensive Assist Verbally Normal                       Personal Care Assistance Level  of Assistance  Bathing,Feeding,Dressing Bathing Assistance: Maximum assistance Feeding assistance: Limited assistance Dressing Assistance: Maximum assistance     Functional Limitations Info  Sight,Hearing,Speech Sight Info: Adequate Hearing Info: Adequate Speech Info: Adequate    SPECIAL CARE FACTORS FREQUENCY  PT (By licensed PT),OT (By licensed OT) Blood Pressure Frequency: 5x weekly   PT Frequency: 3x weekly OT Frequency: 3x weekly            Contractures Contractures Info: Not present    Additional Factors Info  Code Status,Allergies,Psychotropic Code Status Info: Full Code Allergies Info: Hydrocodone-acetampinophen and Donepezil Psychotropic Info: Vistaril, Seroquel, Geodon         Current Medications (05/20/2020):  This is the current hospital active medication list Current Facility-Administered Medications  Medication Dose Route Frequency Provider Last Rate Last Admin  . acetaminophen (TYLENOL) tablet 500 mg  500 mg Oral Q6H PRN 07/20/2020, MD   500 mg at 04/19/20 1053  . carbamazepine (TEGRETOL) tablet 400 mg  400 mg Oral BID 06/19/20, NP   400 mg at 05/20/20 0840   And  . carbamazepine (TEGRETOL) tablet 400 mg  400 mg Oral QPM 07/20/20, NP   400 mg at 05/19/20 2142  . citalopram (CELEXA) tablet 40 mg  40 mg Oral Daily 2143, NP   40 mg at 05/20/20 0840  . enoxaparin (LOVENOX) injection 40 mg  40 mg Subcutaneous Q24H 07/20/20, MD   40 mg at 05/19/20 1255  . feeding supplement (ENSURE ENLIVE / ENSURE PLUS) liquid  237 mL  237 mL Oral BID BM Alberteen Sam, MD   237 mL at 05/20/20 0840  . gabapentin (NEURONTIN) capsule 400 mg  400 mg Oral Daily Russella Dar, NP   400 mg at 05/19/20 1405  . LORazepam (ATIVAN) tablet 0.5 mg  0.5 mg Oral BID Russella Dar, NP   0.5 mg at 05/20/20 0840  . QUEtiapine (SEROQUEL) tablet 100 mg  100 mg Oral BID Alberteen Sam, MD   100 mg at 05/20/20 0840  . QUEtiapine  (SEROQUEL) tablet 150 mg  150 mg Oral QHS Alberteen Sam, MD   150 mg at 05/19/20 2141     Discharge Medications: Please see discharge summary for a list of discharge medications.  Relevant Imaging Results:  Relevant Lab Results:   Additional Information SSN#:  [id number]  Inis Sizer, LCSW

## 2020-05-20 NOTE — Progress Notes (Signed)
[facility] PROGRESS NOTE  Sherry Hicks UUV:[id number] DOB: 1960/06/21 DOA: 01/27/2020 PCP: Sherry Jumbo, DO       03/29/2020                        04/12/2020   Status: The patient will require care spanning > 2 midnights and should be moved to inpatient because: Altered mental status and Unsafe d/c plan -difficult to place secondary to need for skilled nursing facility secondary to severe dementia.  Has required frequent medication adjustments to manage dementia related behaviors  Dispo:               The patient is from: Home              Anticipated d/c is to: SNF              Anticipated d/c date is: > 3 days              Barriers to discharge: Still awaiting SNF bed offer-behavioral disturbances secondary to severe dementia are better controlled with appropriate medications and diversions (baby doll).  Clinicals have been faxed out to multiple facilities but still no firm bed offers as of 3/8   Difficult to place patient Yes  Level of care: Med-Surg  Code Status: Full Family Communication: 4/1 Spoke w/ dtr Sherry Hicks DVT prophylaxis: Ambulatory and low risk for DVT Vaccination status: Discussed with daughter Sherry Hicks.  She states on the whole she does not believe in vaccinations but understands that if the facility requires her mother to be vaccinated prior to injury she will allow her mother to receive a vaccination.  I did let her know that some facilities require full vaccination and that with the mRNA vaccines to be fully vaccinated takes up to 6 weeks with a 2-week waiting period after the second vaccine given.  Stated that she preferred the Pfizer vaccine and did not want her mother to have the Janssen vaccine  Foley catheter: No  HPI: 60 y.o.Fwith Alzheimer's dementia with behavioral disturbance who presented with progressively erratic behavior over months.  See longer summary from H&P on 2/9, but in brief, patient had been living in [place] for years until  family were informed by neighbors that patient was wandering, appeared unbathed, and she moved to [facility]. Here, she lived with daughter for about 2 years, but in the last few months, she started to behave more impulsively, to at times seem aggressive and have verbal outbursts, severe insomnia, and so APS recommended the patient be brought to the ER.  In the ER, the patient was seen by Psychiatry and started on new Seroquel. She was held pending geri-psych placement, which was delayed for several months.  Daytime behaviors improved on high dose Seroquel but continues with sundowning behaviors. Added Celexa as mood stabilizer along with Tegretol on 2/14.  Behaviors and nocturnal sleep also improved with the addition of scheduled twice daily Ativan.  Of note patient much more calm after her daughter brought up multiple items from home including a baby doll which the patient "cares for" regularly.   Subjective: Awake.  Curt responses to questions asked but otherwise very polite.  Objective: Vitals with BMI 05/20/2020 05/19/2020 05/19/2020  Height - - -  Weight - - -  BMI - - -  Systolic 124 124 474  Diastolic 76 70 78  Pulse 65 75 94    Intake/Output Summary (Last 24 hours) at 05/20/2020 0827 Last data filed at  05/19/2020 1700 Gross per 24 hour  Intake 657 ml  Output --  Net 657 ml   Filed Weights   05/01/20 0625 05/05/20 2100 05/12/20 2043  Weight: 59.8 kg 59 kg 59 kg    Exam:  Constitutional: Awake, appears to be comfortable, no acute distress Respiratory: Anterior lung sounds are clear to auscultation.  Room air. Cardiovascular: Heart sounds are S1-S2, no tachycardia, no peripheral edema, skin warm and dry Abdomen:  LBM 4/7, soft nontender with normoactive bowel sounds Neurologic: CN 2-12 grossly intact on visual inspection. nonfocal Psychiatric: Awake, oriented times name only.  Pleasant affect     Assessment/Plan: Acute problems: Dementia with behavioral disturbance Continue  Seroquel-QTC 2/14 <500; 3/3 Qtc 420 ms Continue Tegretol 400 mg 8 am and 2 pm and 400 mg 6 pm. 3/5 LFTs repeated and are within normal limits-we will check LFTs again in a.m. on 3/15.  Patient is on max dose of Tegretol for a patient of her age so no room to increase 4/1 adjust Neurontin dose.  Will change to 400 mg daily at 3 PM to help address sundowning symptoms. Vistaril without any significant impact on behavior so was discontinued in favor of Ativan 4/4 decreased Ativan to BID with improvement of alertness-cont Celexa 40 mg daily  2/14 reevaluated by PT/OT and given her status of custodial care not appropriate for acute PT/OT  Pressure injury Right anterior hip/Stage 2 -Continue air mattress -Maximize nutrition  Pressure Injury 05/17/20 Left Stage 2 -  Partial thickness loss of dermis presenting as a shallow open injury with a red, pink wound bed without slough. (Active)  Date First Assessed/Time First Assessed: 05/17/20 1236   Location Orientation: Left  Staging: Stage 2 -  Partial thickness loss of dermis presenting as a shallow open injury with a red, pink wound bed without slough.    Assessments 05/17/2020 12:36 PM 05/19/2020  8:30 PM  Dressing Type Foam - Lift dressing to assess site every shift Foam - Lift dressing to assess site every shift  Dressing -- Clean;Dry;Intact  Dressing Change Frequency -- PRN     No Linked orders to display   Urinary incontinence -Continue regular pericare  Other problems: Moderate to severe protein calorie malnutrition -Continuefeeding supplements Estimated body mass index is 20.99 kg/m as calculated from the following:   Height as of this encounter: 5\' 6"  (1.676 m).   Weight as of this encounter: 59 kg.  Transaminitis -Resolved   Data Reviewed: Basic Metabolic Panel: No results for input(s): NA, K, CL, CO2, GLUCOSE, BUN, CREATININE, CALCIUM, MG, PHOS in the last 168 hours. Liver Function Tests: No results for input(s): AST, ALT, ALKPHOS,  BILITOT, PROT, ALBUMIN in the last 168 hours. No results for input(s): LIPASE, AMYLASE in the last 168 hours. No results for input(s): AMMONIA in the last 168 hours. CBC: No results for input(s): WBC, NEUTROABS, HGB, HCT, MCV, PLT in the last 168 hours. Cardiac Enzymes: No results for input(s): CKTOTAL, CKMB, CKMBINDEX, TROPONINI in the last 168 hours. BNP (last 3 results) No results for input(s): BNP in the last 8760 hours.  ProBNP (last 3 results) No results for input(s): PROBNP in the last 8760 hours.  CBG: No results for input(s): GLUCAP in the last 168 hours.  No results found for this or any previous visit (from the past 240 hour(s)).   Studies: No results found.  Scheduled Meds: . carbamazepine  400 mg Oral BID   And  . carbamazepine  400 mg Oral QPM  .  citalopram  40 mg Oral Daily  . enoxaparin (LOVENOX) injection  40 mg Subcutaneous Q24H  . feeding supplement  237 mL Oral BID BM  . gabapentin  400 mg Oral Daily  . LORazepam  0.5 mg Oral BID  . QUEtiapine  100 mg Oral BID  . QUEtiapine  150 mg Oral QHS   Continuous Infusions:  Principal Problem:   Dementia with behavioral disturbance (HCC) Active Problems:   Major depressive disorder, recurrent episode (HCC)   History of asthma, mild, intermittent   Early Onset Dementia with behavioral problem (HCC)   Protein-calorie malnutrition, moderate (HCC)   Transaminitis   Pressure injury of skin   Alzheimer disease (HCC)   Pressure ulcer of right hip, stage 2 (HCC)   Consultants:  Psychiatry  Procedures:  None  Antibiotics: Anti-infectives (From admission, onward)   None       Time spent: 20 minutes    Junious Silk ANP  [facility] 7 am - 330 pm/M-F for direct patient care and secure chat Please refer to Amion for contact info 106 days

## 2020-05-21 DIAGNOSIS — F0391 Unspecified dementia with behavioral disturbance: Secondary | ICD-10-CM | POA: Diagnosis not present

## 2020-05-21 NOTE — Plan of Care (Signed)
  Problem: Coping: Goal: Level of anxiety will decrease Outcome: Not Progressing   

## 2020-05-21 NOTE — Progress Notes (Signed)
[facility] PROGRESS NOTE  Sherry Hicks GNO:[id number] DOB: 1961/02/07 DOA: 01/27/2020 PCP: [name], DO       03/29/2020                        04/12/2020   Status: The patient will require care spanning > 2 midnights and should be moved to inpatient because: Altered mental status and Unsafe d/c plan -difficult to place secondary to need for skilled nursing facility secondary to severe dementia.  Has required frequent medication adjustments to manage dementia related behaviors  Dispo:               The patient is from: Home              Anticipated d/c is to: SNF              Anticipated d/c date is: > 3 days              Barriers to discharge: Still awaiting SNF bed offer-behavioral disturbances secondary to severe dementia are better controlled with appropriate medications and diversions (baby doll).  Clinicals have been faxed out to multiple facilities but still no firm bed offers as of 3/8   Difficult to place patient Yes  Level of care: Med-Surg  Code Status: Full Family Communication: 4/1 Spoke w/ dtr [name] DVT prophylaxis: Ambulatory and low risk for DVT Vaccination status: Discussed with daughter [name].  She states on the whole she does not believe in vaccinations but understands that if the facility requires her mother to be vaccinated prior to injury she will allow her mother to receive a vaccination.  I did let her know that some facilities require full vaccination and that with the mRNA vaccines to be fully vaccinated takes up to 6 weeks with a 2-week waiting period after the second vaccine given.  Stated that she preferred the Pfizer vaccine and did not want her mother to have the Janssen vaccine  Foley catheter: No  HPI: 60 y.o.Fwith Alzheimer's dementia with behavioral disturbance who presented with progressively erratic behavior over months.  See longer summary from H&P on 2/9, but in brief, patient had been living in [place] for years until  family were informed by neighbors that patient was wandering, appeared unbathed, and she moved to [facility]. Here, she lived with daughter for about 2 years, but in the last few months, she started to behave more impulsively, to at times seem aggressive and have verbal outbursts, severe insomnia, and so APS recommended the patient be brought to the ER.  In the ER, the patient was seen by Psychiatry and started on new Seroquel. She was held pending geri-psych placement, which was delayed for several months.  Daytime behaviors improved on high dose Seroquel but continues with sundowning behaviors. Added Celexa as mood stabilizer along with Tegretol on 2/14.  Behaviors and nocturnal sleep also improved with the addition of scheduled twice daily Ativan.  Of note patient much more calm after her daughter brought up multiple items from home including a baby doll which the patient "cares for" regularly.   Subjective: Awake and pleasant.  Had not eaten breakfast yet.  Opened her tray and told her what she was having and she smiled broadly.  Objective: Vitals with BMI 05/21/2020 05/21/2020 05/20/2020  Height - - -  Weight - - -  BMI - - -  Systolic 121 83 92  Diastolic 71 49 60  Pulse 55 57 87  Intake/Output Summary (Last 24 hours) at 05/21/2020 0804 Last data filed at 05/20/2020 1720 Gross per 24 hour  Intake 817 ml  Output --  Net 817 ml   Filed Weights   05/01/20 0625 05/05/20 2100 05/12/20 2043  Weight: 59.8 kg 59 kg 59 kg    Exam:  Constitutional: Awake, pleasant, no acute distress Respiratory: Lungs clear, room air Cardiovascular: Normal heart sounds, normotensive, no edema Abdomen:  LBM 4/7, soft, normoactive bowel sounds.  Eats better when fed  Neurologic: CN 2-12 grossly intact on visual inspection. nonfocal Psychiatric: Alert and oriented times name only     Assessment/Plan: Acute problems: Dementia with behavioral disturbance Continue Seroquel-QTC 2/14 <500; 3/3 Qtc 420  ms Continue Tegretol 400 mg 8 am and 2 pm and 400 mg 6 pm. 3/5 LFTs repeated and are within normal limits-we will check LFTs again in a.m. on 3/15.  Patient is on max dose of Tegretol for a patient of her age so no room to increase 4/1 adjust Neurontin dose.  Will change to 400 mg daily at 3 PM to help address sundowning symptoms. Vistaril without any significant impact on behavior so was discontinued in favor of Ativan 4/4 decreased Ativan to BID with improvement of alertness-cont Celexa 40 mg daily  2/14 reevaluated by PT/OT and given her status of custodial care not appropriate for acute PT/OT  Pressure injury Right anterior hip/Stage 2 -Continue air mattress -Maximize nutrition  Pressure Injury 05/17/20 Left Stage 2 -  Partial thickness loss of dermis presenting as a shallow open injury with a red, pink wound bed without slough. (Active)  Date First Assessed/Time First Assessed: 05/17/20 1236   Location Orientation: Left  Staging: Stage 2 -  Partial thickness loss of dermis presenting as a shallow open injury with a red, pink wound bed without slough.    Assessments 05/17/2020 12:36 PM 05/20/2020  7:51 PM  Dressing Type Foam - Lift dressing to assess site every shift Foam - Lift dressing to assess site every shift  Dressing -- Clean;Dry;Intact  Dressing Change Frequency -- PRN  Site / Wound Assessment -- Clean;Dry     No Linked orders to display   Urinary incontinence -Continue regular pericare  Other problems: Moderate to severe protein calorie malnutrition -Continuefeeding supplements Estimated body mass index is 20.99 kg/m as calculated from the following:   Height as of this encounter: 5\' 6"  (1.676 m).   Weight as of this encounter: 59 kg.  Transaminitis -Resolved   Data Reviewed: Basic Metabolic Panel: No results for input(s): NA, K, CL, CO2, GLUCOSE, BUN, CREATININE, CALCIUM, MG, PHOS in the last 168 hours. Liver Function Tests: No results for input(s): AST, ALT,  ALKPHOS, BILITOT, PROT, ALBUMIN in the last 168 hours. No results for input(s): LIPASE, AMYLASE in the last 168 hours. No results for input(s): AMMONIA in the last 168 hours. CBC: No results for input(s): WBC, NEUTROABS, HGB, HCT, MCV, PLT in the last 168 hours. Cardiac Enzymes: No results for input(s): CKTOTAL, CKMB, CKMBINDEX, TROPONINI in the last 168 hours. BNP (last 3 results) No results for input(s): BNP in the last 8760 hours.  ProBNP (last 3 results) No results for input(s): PROBNP in the last 8760 hours.  CBG: No results for input(s): GLUCAP in the last 168 hours.  No results found for this or any previous visit (from the past 240 hour(s)).   Studies: No results found.  Scheduled Meds: . carbamazepine  400 mg Oral BID   And  . carbamazepine  400 mg Oral QPM  . citalopram  40 mg Oral Daily  . enoxaparin (LOVENOX) injection  40 mg Subcutaneous Q24H  . feeding supplement  237 mL Oral BID BM  . gabapentin  400 mg Oral Daily  . LORazepam  0.5 mg Oral BID  . QUEtiapine  100 mg Oral BID  . QUEtiapine  150 mg Oral QHS   Continuous Infusions:  Principal Problem:   Dementia with behavioral disturbance (HCC) Active Problems:   Major depressive disorder, recurrent episode (HCC)   History of asthma, mild, intermittent   Early Onset Dementia with behavioral problem (HCC)   Protein-calorie malnutrition, moderate (HCC)   Transaminitis   Pressure injury of skin   Alzheimer disease (HCC)   Pressure ulcer of right hip, stage 2 (HCC)   Consultants:  Psychiatry  Procedures:  None  Antibiotics: Anti-infectives (From admission, onward)   None       Time spent: 20 minutes    Junious Silk ANP  [facility] 7 am - 330 pm/M-F for direct patient care and secure chat Please refer to Amion for contact info 106 days

## 2020-05-21 NOTE — TOC Progression Note (Signed)
Transition of Care [facility]) - Progression Note    Patient Details  Name: Sherry Hicks MRN: [id number] Date of Birth: Jul 30, 1960  Transition of [facility]) CM/SW Contact  Janae Bridgeman, RN Phone Number: 05/21/2020, 2:11 PM  Clinical Narrative:    CM spoke with Willodean Rosenthal, MSW and clinicals sent to [facility] for possible admission to the facility for LTC placement.  CM and MSW will continue to follow the patient for LTC placement.   Expected Discharge Plan: Skilled Nursing Facility Barriers to Discharge: Continued Medical Work up,Barriers Unresolved (comment),SNF Pending bed offer  Expected Discharge Plan and Services Expected Discharge Plan: Skilled Nursing Facility In-house Referral: Clinical Social Work Discharge Planning Services: CM Consult Post Acute Care Choice: Skilled Nursing Facility Living arrangements for the past 2 months: Apartment                                       Social Determinants of Health (SDOH) Interventions    Readmission Risk Interventions Readmission Risk Prevention Plan 04/06/2020  Post Dischage Appt Complete  Medication Screening Complete  Transportation Screening Complete  Some recent data might be hidden

## 2020-05-22 DIAGNOSIS — F0391 Unspecified dementia with behavioral disturbance: Secondary | ICD-10-CM | POA: Diagnosis not present

## 2020-05-22 LAB — BASIC METABOLIC PANEL
Anion gap: 11 (ref 5–15)
BUN: 16 mg/dL (ref 6–20)
CO2: 25 mmol/L (ref 22–32)
Calcium: 9.3 mg/dL (ref 8.9–10.3)
Chloride: 105 mmol/L (ref 98–111)
Creatinine, Ser: 0.68 mg/dL (ref 0.44–1.00)
GFR, Estimated: >60 mL/min (ref >60)
Glucose, Bld: 110 mg/dL — ABNORMAL HIGH (ref 70–99)
Potassium: 4 mmol/L (ref 3.5–5.1)
Sodium: 141 mmol/L (ref 135–145)

## 2020-05-22 LAB — CBC
HCT: 34.1 % — ABNORMAL LOW (ref 36.0–46.0)
Hemoglobin: 11.3 g/dL — ABNORMAL LOW (ref 12.0–15.0)
MCH: 31.7 pg (ref 26.0–34.0)
MCHC: 33.1 g/dL (ref 30.0–36.0)
MCV: 95.8 fL (ref 80.0–100.0)
Platelets: 250 10*3/uL (ref 150–400)
RBC: 3.56 MIL/uL — ABNORMAL LOW (ref 3.87–5.11)
RDW: 13.3 % (ref 11.5–15.5)
WBC: 4.2 10*3/uL (ref 4.0–10.5)
nRBC: 0 % (ref 0.0–0.2)

## 2020-05-22 NOTE — Progress Notes (Signed)
PROGRESS NOTE    Sherry Hicks  OZH:[id number] DOB: Jul 20, 1960 DOA: 01/27/2020 PCP: [name], DO   Chief Complaint  Patient presents with  . Altered Mental Status  Brief Narrative: 60 year old female with history of Alzheimer's dementia with behavioral disturbance presented with progressively erratic behavior over months.  She was seen on 2/19 and was admitted. As per GEX:[id number] had been living in [place] for years until family were informed by neighbors that patient was wandering, appeared unbathed, and she moved to [facility]. Here, she lived with daughter for about 2 years, but in the last few months, she started to behave more impulsively, to at times seem aggressive and have verbal outbursts, severe insomnia, and so APS recommended the patient be brought to the ER. In the ER, the patient was seen by Psychiatry and started on new Seroquel. She was held pending geri-psych placement, which was delayed for several months.   Patient was admitted medication adjusted Daytime behaviors improved on high dose Seroquel but continues with sundowning behaviors, Celexa added as a mood stabilizer along with Tegretol on 2/14. Her behaviors and nocturnal sleep also improved with the addition of scheduled twice daily Ativan.   Subjective: Seen and examined this morning.  She is alert awake does not appear agitated this morning.  With baseline mental status.  Assessment & Plan: Dementia with behavioral disturbance/history of depression Continue current high dose Seroquel, Tegretol, Celexa, Ativan and Neurontin.  Monitor QTC.  Continue delirium precaution fall precaution and supportive care.  History of asthma, mild, intermittent; respiratory status is stable. Protein-calorie malnutrition, moderate. Transaminitis-monitor LFTs Pressure injury RT ANT HIP STAGE 2-offloading and routine wound care.  Diet Order            DIET FINGER FOODS Room service appropriate? Yes; Fluid consistency: Thin   Diet effective now                Patient's Body mass index is 21.88 kg/m.  DVT prophylaxis: enoxaparin (LOVENOX) injection 40 mg Start: 04/28/20 1245 Code Status:   Code Status: Full Code  Family Communication: plan of care discussed with patient at bedside.  Status is: Inpatient Remains inpatient appropriate because:Unsafe d/c plan  Dispo:inpatient  Patient From: home    Planned Disposition: SNF    Medically stable for discharge: yes       Unresulted Labs (From admission, onward)         None    Medications reviewed:  Scheduled Meds: . carbamazepine  400 mg Oral BID   And  . carbamazepine  400 mg Oral QPM  . citalopram  40 mg Oral Daily  . enoxaparin (LOVENOX) injection  40 mg Subcutaneous Q24H  . feeding supplement  237 mL Oral BID BM  . gabapentin  400 mg Oral Daily  . LORazepam  0.5 mg Oral BID  . QUEtiapine  100 mg Oral BID  . QUEtiapine  150 mg Oral QHS   Continuous Infusions:  Consultants:see note  Procedures:see note  Antimicrobials: Anti-infectives (From admission, onward)   None     Culture/Microbiology No results found for: SDES, SPECREQUEST, CULT, REPTSTATUS  Other culture-see note  Objective: Vitals: Today's Vitals   05/21/20 2004 05/22/20 0302 05/22/20 0432 05/22/20 1001  BP: (!) 112/50  124/71 118/78  Pulse: 89  73 74  Resp: 16  16 19   Temp: 97.7 F (36.5 C)  97.9 F (36.6 C) 98.2 F (36.8 C)  TempSrc: Oral   Oral  SpO2: 98%  100% 99%  Weight:  61.5 kg    Height:      PainSc: Asleep       Intake/Output Summary (Last 24 hours) at 05/22/2020 1122 Last data filed at 05/22/2020 0900 Gross per 24 hour  Intake 1360 ml  Output --  Net 1360 ml   Filed Weights   05/05/20 2100 05/12/20 2043 05/22/20 0302  Weight: 59 kg 59 kg 61.5 kg   Weight change:   Intake/Output from previous day: 04/08 0701 - 04/09 0700 In: 1600 [P.O.:1600] Out: -  Intake/Output this shift: Total I/O In: 240 [P.O.:240] Out: -  Filed Weights   05/05/20  2100 05/12/20 2043 05/22/20 0302  Weight: 59 kg 59 kg 61.5 kg    Examination: General exam: AAA, demented,NAD, weak appearing. HEENT:Oral mucosa moist, Ear/Nose WNL grossly,dentition normal. Respiratory system: bilaterally diminished,no use of accessory muscle, non tender. Cardiovascular system: S1 & S2 +, regular, No JVD. Gastrointestinal system: Abdomen soft, NT,ND, BS+. Nervous System:Alert, awake, moving extremities and grossly nonfocal Extremities: No edema, distal peripheral pulses palpable.  Skin: No rashes,no icterus. MSK: Normal muscle bulk,tone, power  Data Reviewed: I have personally reviewed following labs and imaging studies CBC: Recent Labs  Lab 05/22/20 0354  WBC 4.2  HGB 11.3*  HCT 34.1*  MCV 95.8  PLT 250   Basic Metabolic Panel: Recent Labs  Lab 05/22/20 0354  NA 141  K 4.0  CL 105  CO2 25  GLUCOSE 110*  BUN 16  CREATININE 0.68  CALCIUM 9.3   GFR: Estimated Creatinine Clearance: 70.9 mL/min (by C-G formula based on SCr of 0.68 mg/dL). Liver Function Tests: No results for input(s): AST, ALT, ALKPHOS, BILITOT, PROT, ALBUMIN in the last 168 hours. No results for input(s): LIPASE, AMYLASE in the last 168 hours. No results for input(s): AMMONIA in the last 168 hours. Coagulation Profile: No results for input(s): INR, PROTIME in the last 168 hours. Cardiac Enzymes: No results for input(s): CKTOTAL, CKMB, CKMBINDEX, TROPONINI in the last 168 hours. BNP (last 3 results) No results for input(s): PROBNP in the last 8760 hours. HbA1C: No results for input(s): HGBA1C in the last 72 hours. CBG: No results for input(s): GLUCAP in the last 168 hours. Lipid Profile: No results for input(s): CHOL, HDL, LDLCALC, TRIG, CHOLHDL, LDLDIRECT in the last 72 hours. Thyroid Function Tests: No results for input(s): TSH, T4TOTAL, FREET4, T3FREE, THYROIDAB in the last 72 hours. Anemia Panel: No results for input(s): VITAMINB12, FOLATE, FERRITIN, TIBC, IRON, RETICCTPCT  in the last 72 hours. Sepsis Labs: No results for input(s): PROCALCITON, LATICACIDVEN in the last 168 hours.  No results found for this or any previous visit (from the past 240 hour(s)).   Radiology Studies: No results found.   LOS: 48 days   Lanae Boast, MD [facility]  05/22/2020, 11:22 AM

## 2020-05-23 DIAGNOSIS — F0391 Unspecified dementia with behavioral disturbance: Secondary | ICD-10-CM | POA: Diagnosis not present

## 2020-05-23 NOTE — Progress Notes (Signed)
PROGRESS NOTE    Sherry Hicks  NIO:[id number] DOB: 1960-12-24 DOA: 01/27/2020 PCP: [name], DO   Chief Complaint  Patient presents with  . Altered Mental Status  Brief Narrative: 60 year old female with history of Alzheimer's dementia with behavioral disturbance presented with progressively erratic behavior over months.  She was seen on 2/19 and was admitted. As per GHW:EXHBZJI had been living in [place] for years until family were informed by neighbors that patient was wandering, appeared unbathed, and she moved to [facility] Here, she lived with daughter for about 2 years, but in the last few months, she started to behave more impulsively, to at times seem aggressive and have verbal outbursts, severe insomnia, and so APS recommended the patient be brought to the ER. In the ER, the patient was seen by Psychiatry and started on new Seroquel. She was held pending geri-psych placement, which was delayed for several months.   Patient was admitted medication adjusted Daytime behaviors improved on high dose Seroquel but continues with sundowning behaviors, Celexa added as a mood stabilizer along with Tegretol on 2/14. Her behaviors and nocturnal sleep also improved with the addition of scheduled twice daily Ativan.   Subjective: No acute events overnight.  Resting comfortably Nursing at the bedside medicating her.   Assessment & Plan: Dementia with behavioral disturbance/history of depression Continue current high dose Seroquel, Tegretol, Celexa, Ativan and Neurontin.  Monitor QTC.  Continue delirium precaution fall precaution and supportive care.  History of asthma, mild, intermittent; respiratory status is stable. Protein-calorie malnutrition, moderate. Transaminitis-monitor LFTs Pressure injury RT ANT HIP STAGE 2-offloading and routine wound care.  Diet Order            DIET FINGER FOODS Room service appropriate? Yes; Fluid consistency: Thin  Diet effective now                 Patient's Body mass index is 21.88 kg/m.  DVT prophylaxis: enoxaparin (LOVENOX) injection 40 mg Start: 04/28/20 1245 Code Status:   Code Status: Full Code  Family Communication: plan of care discussed with patient at bedside.  Status is: Inpatient Remains inpatient appropriate because:Unsafe d/c plan  Dispo: Remains inpatient   Patient From: home    Planned Disposition: SNF    Medically stable for discharge: yes       Unresulted Labs (From admission, onward)         None    Medications reviewed:  Scheduled Meds: . carbamazepine  400 mg Oral BID   And  . carbamazepine  400 mg Oral QPM  . citalopram  40 mg Oral Daily  . enoxaparin (LOVENOX) injection  40 mg Subcutaneous Q24H  . feeding supplement  237 mL Oral BID BM  . gabapentin  400 mg Oral Daily  . LORazepam  0.5 mg Oral BID  . QUEtiapine  100 mg Oral BID  . QUEtiapine  150 mg Oral QHS   Continuous Infusions:  Consultants:see note  Procedures:see note  Antimicrobials: Anti-infectives (From admission, onward)   None     Culture/Microbiology No results found for: SDES, SPECREQUEST, CULT, REPTSTATUS  Other culture-see note  Objective: Vitals: Today's Vitals   05/22/20 1001 05/22/20 1748 05/22/20 2040 05/23/20 0456  BP: 118/78 130/67  122/71  Pulse: 74 92  64  Resp: 19 17  17   Temp: 98.2 F (36.8 C) 98.7 F (37.1 C)  (!) 97.4 F (36.3 C)  TempSrc: Oral Oral  Oral  SpO2: 99% 99%  98%  Weight:  Height:      PainSc:   0-No pain     Intake/Output Summary (Last 24 hours) at 05/23/2020 1009 Last data filed at 05/23/2020 0200 Gross per 24 hour  Intake 100 ml  Output 0 ml  Net 100 ml   Filed Weights   05/05/20 2100 05/12/20 2043 05/22/20 0302  Weight: 59 kg 59 kg 61.5 kg   Weight change:   Intake/Output from previous day: 04/09 0701 - 04/10 0700 In: 340 [P.O.:340] Out: 0  Intake/Output this shift: No intake/output data recorded. Filed Weights   05/05/20 2100 05/12/20 2043 05/22/20 0302   Weight: 59 kg 59 kg 61.5 kg    Examination:  General exam: Alert awake oriented  To self,NAD, weak appearing. HEENT:Oral mucosa moist, Ear/Nose WNL grossly, dentition normal. Respiratory system: bilaterally diminishedd,no wheezing or crackles,no use of accessory muscle Cardiovascular system: S1 & S2 +, No JVD,. Gastrointestinal system: Abdomen soft, NT,ND, BS+ Nervous System:Alert, awake, moving extremities and grossly nonfocal Extremities: No edema, distal peripheral pulses palpable.  Skin: No rashes,no icterus. MSK: Normal muscle bulk,tone, power  Data Reviewed: I have personally reviewed following labs and imaging studies CBC: Recent Labs  Lab 05/22/20 0354  WBC 4.2  HGB 11.3*  HCT 34.1*  MCV 95.8  PLT 250   Basic Metabolic Panel: Recent Labs  Lab 05/22/20 0354  NA 141  K 4.0  CL 105  CO2 25  GLUCOSE 110*  BUN 16  CREATININE 0.68  CALCIUM 9.3   GFR: Estimated Creatinine Clearance: 70.9 mL/min (by C-G formula based on SCr of 0.68 mg/dL). Liver Function Tests: No results for input(s): AST, ALT, ALKPHOS, BILITOT, PROT, ALBUMIN in the last 168 hours. No results for input(s): LIPASE, AMYLASE in the last 168 hours. No results for input(s): AMMONIA in the last 168 hours. Coagulation Profile: No results for input(s): INR, PROTIME in the last 168 hours. Cardiac Enzymes: No results for input(s): CKTOTAL, CKMB, CKMBINDEX, TROPONINI in the last 168 hours. BNP (last 3 results) No results for input(s): PROBNP in the last 8760 hours. HbA1C: No results for input(s): HGBA1C in the last 72 hours. CBG: No results for input(s): GLUCAP in the last 168 hours. Lipid Profile: No results for input(s): CHOL, HDL, LDLCALC, TRIG, CHOLHDL, LDLDIRECT in the last 72 hours. Thyroid Function Tests: No results for input(s): TSH, T4TOTAL, FREET4, T3FREE, THYROIDAB in the last 72 hours. Anemia Panel: No results for input(s): VITAMINB12, FOLATE, FERRITIN, TIBC, IRON, RETICCTPCT in the  last 72 hours. Sepsis Labs: No results for input(s): PROCALCITON, LATICACIDVEN in the last 168 hours.  No results found for this or any previous visit (from the past 240 hour(s)).   Radiology Studies: No results found.   LOS: 49 days   Lanae Boast, MD [facility]  05/23/2020, 10:09 AM

## 2020-05-24 DIAGNOSIS — F0391 Unspecified dementia with behavioral disturbance: Secondary | ICD-10-CM | POA: Diagnosis not present

## 2020-05-24 NOTE — Plan of Care (Signed)
  Problem: Safety: Goal: Ability to remain free from injury will improve Outcome: Progressing   

## 2020-05-24 NOTE — Progress Notes (Signed)
Unable to complete admission assessment secondary to patient's cognitive impairment.

## 2020-05-24 NOTE — Progress Notes (Signed)
[facility] PROGRESS NOTE  Sherry Hicks:[id number] DOB: 1960/09/07 DOA: 01/27/2020 PCP: [name], DO       03/29/2020                        04/12/2020   Status: The patient will require care spanning > 2 midnights and should be moved to inpatient because: Altered mental status and Unsafe d/c plan -difficult to place secondary to need for skilled nursing facility secondary to severe dementia.   Dispo:               The patient is from: Home              Anticipated d/c is to: SNF              Anticipated d/c date is: > 3 days              Barriers to discharge: Still awaiting SNF bed offer   Difficult to place patient Yes  Level of care: Med-Surg  Code Status: Full Family Communication: 4/1 Spoke w/ dtr [name] DVT prophylaxis: Ambulatory and low risk for DVT Vaccination status: Discussed with daughter [name].  She states on the whole she does not believe in vaccinations but understands that if the facility requires her mother to be vaccinated prior to injury she will allow her mother to receive a vaccination.  I did let her know that some facilities require full vaccination and that with the mRNA vaccines to be fully vaccinated takes up to 6 weeks with a 2-week waiting period after the second vaccine given.  Stated that she preferred the Pfizer vaccine and did not want her mother to have the Janssen vaccine  Foley catheter: No  HPI: 60 y.o.Fwith Alzheimer's dementia with behavioral disturbance who presented with progressively erratic behavior over months.  See longer summary from H&P on 2/9, but in brief, patient had been living in [place] for years until family were informed by neighbors that patient was wandering, appeared unbathed, and she moved to [facility]. Here, she lived with daughter for about 2 years, but in the last few months, she started to behave more impulsively, to at times seem aggressive and have verbal outbursts, severe insomnia, and so APS  recommended the patient be brought to the ER.  In the ER, the patient was seen by Psychiatry and started on new Seroquel. She was held pending geri-psych placement, which was delayed for several months.  Daytime behaviors improved on high dose Seroquel but continues with sundowning behaviors. Added Celexa as mood stabilizer along with Tegretol on 2/14.  Behaviors and nocturnal sleep also improved with the addition of scheduled twice daily Ativan.  Of note patient much more calm after her daughter brought up multiple items from home including a baby doll which the patient "cares for" regularly.   Subjective: Awake and at baseline mentation.  Interaction not as appropriate today as has been in the past.  Objective: Vitals with BMI 05/24/2020 05/23/2020 05/23/2020  Height - - -  Weight - - -  BMI - - -  Systolic 114 124 631  Diastolic 58 68 71  Pulse 62 90 64    Intake/Output Summary (Last 24 hours) at 05/24/2020 0832 Last data filed at 05/24/2020 0630 Gross per 24 hour  Intake 734 ml  Output 0 ml  Net 734 ml   Filed Weights   05/05/20 2100 05/12/20 2043 05/22/20 0302  Weight: 59 kg 59 kg 61.5  kg    Exam:  Constitutional: Awake, calm, no acute distress and appears to be comfortable Respiratory: Lungs are clear, stable on room air Cardiovascular: S1-S2, pulse regular, no peripheral edema Abdomen:  LBM 4/10, mit. bowel sounds present.  Still has not started breakfast tray noting needs assistance from staff. Neurologic: CN 2-12 grossly intact on visual inspection. nonfocal Psychiatric: Awake and oriented to name only.  Flat but pleasant affect today.     Assessment/Plan: Acute problems: Dementia with behavioral disturbance Continue Seroquel-QTC 2/14 <500; 3/3 Qtc 420 ms Continue Tegretol 400 mg 8 am and 2 pm and 400 mg 6 pm. 3/5 LFTs repeated and are within normal limits-we will check LFTs again in a.m. on 3/15.  Patient is on max dose of Tegretol for a patient of her age so no  room to increase 4/1 adjust Neurontin dose.  Will change to 400 mg daily at 3 PM to help address sundowning symptoms. Vistaril without any significant impact on behavior so was discontinued in favor of Ativan 4/4 decreased Ativan to BID with improvement of alertness-cont Celexa 40 mg daily  2/14 reevaluated by PT/OT and given her status of custodial care not appropriate for acute PT/OT  Pressure injury Right anterior hip/Stage 2 -Continue air mattress -Maximize nutrition  Pressure Injury 05/17/20 Left Stage 2 -  Partial thickness loss of dermis presenting as a shallow open injury with a red, pink wound bed without slough. (Active)  Date First Assessed/Time First Assessed: 05/17/20 1236   Location Orientation: Left  Staging: Stage 2 -  Partial thickness loss of dermis presenting as a shallow open injury with a red, pink wound bed without slough.    Assessments 05/17/2020 12:36 PM 05/23/2020  7:47 PM  Dressing Type Foam - Lift dressing to assess site every shift Foam - Lift dressing to assess site every shift  Dressing -- Clean;Dry;Intact     No Linked orders to display   Urinary incontinence -Continue regular pericare  Other problems: Moderate to severe protein calorie malnutrition -Continuefeeding supplements Estimated body mass index is 21.88 kg/m as calculated from the following:   Height as of this encounter: 5\' 6"  (1.676 m).   Weight as of this encounter: 61.5 kg.  Transaminitis -Resolved   Data Reviewed: Basic Metabolic Panel: Recent Labs  Lab 05/22/20 0354  NA 141  K 4.0  CL 105  CO2 25  GLUCOSE 110*  BUN 16  CREATININE 0.68  CALCIUM 9.3   Liver Function Tests: No results for input(s): AST, ALT, ALKPHOS, BILITOT, PROT, ALBUMIN in the last 168 hours. No results for input(s): LIPASE, AMYLASE in the last 168 hours. No results for input(s): AMMONIA in the last 168 hours. CBC: Recent Labs  Lab 05/22/20 0354  WBC 4.2  HGB 11.3*  HCT 34.1*  MCV 95.8  PLT 250    Cardiac Enzymes: No results for input(s): CKTOTAL, CKMB, CKMBINDEX, TROPONINI in the last 168 hours. BNP (last 3 results) No results for input(s): BNP in the last 8760 hours.  ProBNP (last 3 results) No results for input(s): PROBNP in the last 8760 hours.  CBG: No results for input(s): GLUCAP in the last 168 hours.  No results found for this or any previous visit (from the past 240 hour(s)).   Studies: No results found.  Scheduled Meds: . carbamazepine  400 mg Oral BID   And  . carbamazepine  400 mg Oral QPM  . citalopram  40 mg Oral Daily  . enoxaparin (LOVENOX) injection  40 mg  Subcutaneous Q24H  . feeding supplement  237 mL Oral BID BM  . gabapentin  400 mg Oral Daily  . LORazepam  0.5 mg Oral BID  . QUEtiapine  100 mg Oral BID  . QUEtiapine  150 mg Oral QHS   Continuous Infusions:  Principal Problem:   Dementia with behavioral disturbance (HCC) Active Problems:   Major depressive disorder, recurrent episode (HCC)   History of asthma, mild, intermittent   Early Onset Dementia with behavioral problem (HCC)   Protein-calorie malnutrition, moderate (HCC)   Transaminitis   Pressure injury of skin   Alzheimer disease (HCC)   Pressure ulcer of right hip, stage 2 (HCC)   Consultants:  Psychiatry  Procedures:  None  Antibiotics: Anti-infectives (From admission, onward)   None       Time spent: 20 minutes    Junious Silk ANP  [facility] 7 am - 330 pm/M-F for direct patient care and secure chat Please refer to Amion for contact info 106 days

## 2020-05-25 DIAGNOSIS — F0391 Unspecified dementia with behavioral disturbance: Secondary | ICD-10-CM | POA: Diagnosis not present

## 2020-05-25 NOTE — Plan of Care (Signed)
  Problem: Health Behavior/Discharge Planning: Goal: Ability to manage health-related needs will improve Outcome: Progressing   

## 2020-05-25 NOTE — Progress Notes (Addendum)
CSW spoke with Sherry Hicks, Medicaid supervisor at [facility] DSS to request a program change - FL2 was submitted to [facility] for review.  Sherry Hicks is willing to accept the patient under her Medicaid benefits after it is determined that she will qualify for long term benefits.  CSW attempted to reach patient's daughter [name] to update her on plan - no answer, no voicemail option available. CSW communicated with [name] via text - she understands and is agreeable to the discharge plan.  Edwin Dada, MSW, LCSW Transitions of Care  Clinical Social Worker II [phone]

## 2020-05-25 NOTE — Progress Notes (Signed)
[facility] PROGRESS NOTE  Sherry Hicks SPQ:[id number] DOB: 1960-06-15 DOA: 01/27/2020 PCP: [name], DO       03/29/2020                        04/12/2020   Status: The patient will require care spanning > 2 midnights and should be moved to inpatient because: Altered mental status and Unsafe d/c plan -difficult to place secondary to need for skilled nursing facility secondary to severe dementia.   Dispo:               The patient is from: Home              Anticipated d/c is to: SNF              Anticipated d/c date is: > 3 days              Barriers to discharge: Still awaiting SNF bed offer   Difficult to place patient Yes  Level of care: Med-Surg  Code Status: Full Family Communication: 4/1 Spoke w/ dtr [name] DVT prophylaxis: Ambulatory and low risk for DVT Vaccination status: Discussed with daughter [name].  She states on the whole she does not believe in vaccinations but understands that if the facility requires her mother to be vaccinated prior to injury she will allow her mother to receive a vaccination.  I did let her know that some facilities require full vaccination and that with the mRNA vaccines to be fully vaccinated takes up to 6 weeks with a 2-week waiting period after the second vaccine given.  Stated that she preferred the Pfizer vaccine and did not want her mother to have the Janssen vaccine  Foley catheter: No  HPI: 60 y.o.Fwith Alzheimer's dementia with behavioral disturbance who presented with progressively erratic behavior over months.  See longer summary from H&P on 2/9, but in brief, patient had been living in [place] for years until family were informed by neighbors that patient was wandering, appeared unbathed, and she moved to [facility]. Here, she lived with daughter for about 2 years, but in the last few months, she started to behave more impulsively, to at times seem aggressive and have verbal outbursts, severe insomnia, and so APS  recommended the patient be brought to the ER.  In the ER, the patient was seen by Psychiatry and started on new Seroquel. She was held pending geri-psych placement, which was delayed for several months.  Daytime behaviors improved on high dose Seroquel but continues with sundowning behaviors. Added Celexa as mood stabilizer along with Tegretol on 2/14.  Behaviors and nocturnal sleep also improved with the addition of scheduled twice daily Ativan.  Of note patient much more calm after her daughter brought up multiple items from home including a baby doll which the patient "cares for" regularly.   Subjective: Awake.  Pleasant but remains very confused.  Objective: Vitals with BMI 05/25/2020 05/24/2020 05/23/2020  Height - - -  Weight - - -  BMI - - -  Systolic 107 114 333  Diastolic 70 58 68  Pulse 58 62 90    Intake/Output Summary (Last 24 hours) at 05/25/2020 0801 Last data filed at 05/25/2020 0600 Gross per 24 hour  Intake 1060 ml  Output 0 ml  Net 1060 ml   Filed Weights   05/05/20 2100 05/12/20 2043 05/22/20 0302  Weight: 59 kg 59 kg 61.5 kg    Exam:  Constitutional: Alert, no acute  distress, appears comfortable Respiratory: Anterior lung sounds clear.  Room air Cardiovascular: S1-S2.  No JVD or peripheral edema.  Regular pulse Abdomen:  LBM 4/10, soft nontender with normoactive bowel sounds.  Must be assisted with meals. Neurologic: CN 2-12 grossly intact on visual inspection. nonfocal Psychiatric: Alert and oriented times name only.  Severe dementia.  Pleasant affect today.     Assessment/Plan: Acute problems: Dementia with behavioral disturbance Continue Seroquel-QTC 2/14 <500; 3/3 Qtc 420 ms Continue Tegretol 400 mg 8 am and 2 pm and 400 mg 6 pm. 3/5 LFTs repeated and are within normal limits-we will check LFTs again in a.m. on 3/15.  Patient is on max dose of Tegretol for a patient of her age so no room to increase 4/1 adjust Neurontin dose.  Will change to 400  mg daily at 3 PM to help address sundowning symptoms. Vistaril without any significant impact on behavior so was discontinued in favor of Ativan 4/4 decreased Ativan to BID with improvement of alertness-cont Celexa 40 mg daily  2/14 reevaluated by PT/OT and given her status of custodial care not appropriate for acute PT/OT  Pressure injury Right anterior hip/Stage 2 -Continue air mattress -Maximize nutrition  Pressure Injury 05/17/20 Left Stage 2 -  Partial thickness loss of dermis presenting as a shallow open injury with a red, pink wound bed without slough. (Active)  Date First Assessed/Time First Assessed: 05/17/20 1236   Location Orientation: Left  Staging: Stage 2 -  Partial thickness loss of dermis presenting as a shallow open injury with a red, pink wound bed without slough.    Assessments 05/17/2020 12:36 PM 05/24/2020  8:35 PM  Dressing Type Foam - Lift dressing to assess site every shift Foam - Lift dressing to assess site every shift  Dressing -- Clean;Dry;Intact  Dressing Change Frequency -- PRN  Site / Wound Assessment -- Dry;Clean;Red  Treatment -- Off loading     No Linked orders to display   Urinary incontinence -Continue regular pericare  Other problems: Moderate to severe protein calorie malnutrition -Continuefeeding supplements Estimated body mass index is 21.88 kg/m as calculated from the following:   Height as of this encounter: 5\' 6"  (1.676 m).   Weight as of this encounter: 61.5 kg.  Transaminitis -Resolved   Data Reviewed: Basic Metabolic Panel: Recent Labs  Lab 05/22/20 0354  NA 141  K 4.0  CL 105  CO2 25  GLUCOSE 110*  BUN 16  CREATININE 0.68  CALCIUM 9.3   Liver Function Tests: No results for input(s): AST, ALT, ALKPHOS, BILITOT, PROT, ALBUMIN in the last 168 hours. No results for input(s): LIPASE, AMYLASE in the last 168 hours. No results for input(s): AMMONIA in the last 168 hours. CBC: Recent Labs  Lab 05/22/20 0354  WBC 4.2  HGB  11.3*  HCT 34.1*  MCV 95.8  PLT 250   Cardiac Enzymes: No results for input(s): CKTOTAL, CKMB, CKMBINDEX, TROPONINI in the last 168 hours. BNP (last 3 results) No results for input(s): BNP in the last 8760 hours.  ProBNP (last 3 results) No results for input(s): PROBNP in the last 8760 hours.  CBG: No results for input(s): GLUCAP in the last 168 hours.  No results found for this or any previous visit (from the past 240 hour(s)).   Studies: No results found.  Scheduled Meds: . carbamazepine  400 mg Oral BID   And  . carbamazepine  400 mg Oral QPM  . citalopram  40 mg Oral Daily  . enoxaparin (  LOVENOX) injection  40 mg Subcutaneous Q24H  . feeding supplement  237 mL Oral BID BM  . gabapentin  400 mg Oral Daily  . LORazepam  0.5 mg Oral BID  . QUEtiapine  100 mg Oral BID  . QUEtiapine  150 mg Oral QHS   Continuous Infusions:  Principal Problem:   Dementia with behavioral disturbance (HCC) Active Problems:   Major depressive disorder, recurrent episode (HCC)   History of asthma, mild, intermittent   Early Onset Dementia with behavioral problem (HCC)   Protein-calorie malnutrition, moderate (HCC)   Transaminitis   Pressure injury of skin   Alzheimer disease (HCC)   Pressure ulcer of right hip, stage 2 (HCC)   Consultants:  Psychiatry  Procedures:  None  Antibiotics: Anti-infectives (From admission, onward)   None       Time spent: 20 minutes    Junious Silk ANP  [facility] 7 am - 330 pm/M-F for direct patient care and secure chat Please refer to Amion for contact info 106 days

## 2020-05-26 DIAGNOSIS — F0391 Unspecified dementia with behavioral disturbance: Secondary | ICD-10-CM | POA: Diagnosis not present

## 2020-05-26 NOTE — Plan of Care (Signed)
  Problem: Health Behavior/Discharge Planning: Goal: Ability to manage health-related needs will improve Outcome: Progressing   Problem: Coping: Goal: Level of anxiety will decrease Outcome: Progressing   Problem: Safety: Goal: Ability to remain free from injury will improve Outcome: Progressing   Problem: Skin Integrity: Goal: Risk for impaired skin integrity will decrease Outcome: Progressing   

## 2020-05-26 NOTE — Progress Notes (Signed)
CSW spoke with Cindee Salt at [facility] DSS regarding the request for a program change for the switch to long term Medicaid. CSW faxed FL2 to [facility] Leash again for further review - DSS supervisor Marisue Brooklyn was given the [facility] on Monday, 4/11. Lupita Leash states the program change can take up to 45 days to process.  CSW sent e-mail to Wandra Mannan, [facility] Director requesting his involvement with this situation.  Edwin Dada, MSW, LCSW Transitions of Care  Clinical Social Worker II [phone]

## 2020-05-26 NOTE — Progress Notes (Signed)
[facility] PROGRESS NOTE  Sherry Hicks VOH:[id number] DOB: Dec 15, 1960 DOA: 01/27/2020 PCP: [name], DO       03/29/2020                        04/12/2020   Status: The patient will require care spanning > 2 midnights and should be moved to inpatient because: Altered mental status and Unsafe d/c plan -difficult to place secondary to need for skilled nursing facility secondary to severe dementia.   Dispo:               The patient is from: Home              Anticipated d/c is to: SNF              Anticipated d/c date is: > 3 days              Barriers to discharge: Still awaiting SNF bed offer   Difficult to place patient Yes  Level of care: Med-Surg  Code Status: Full Family Communication: 4/1 Spoke w/ dtr [name] DVT prophylaxis: Ambulatory and low risk for DVT Vaccination status: Discussed with daughter [name].  She states on the whole she does not believe in vaccinations but understands that if the facility requires her mother to be vaccinated prior to injury she will allow her mother to receive a vaccination.  I did let her know that some facilities require full vaccination and that with the mRNA vaccines to be fully vaccinated takes up to 6 weeks with a 2-week waiting period after the second vaccine given.  Stated that she preferred the Pfizer vaccine and did not want her mother to have the Janssen vaccine  Foley catheter: No  HPI: 60 y.o.Fwith Alzheimer's dementia with behavioral disturbance who presented with progressively erratic behavior over months.  See longer summary from H&P on 2/9, but in brief, patient had been living in [place] for years until family were informed by neighbors that patient was wandering, appeared unbathed, and she moved to [facility]. Here, she lived with daughter for about 2 years, but in the last few months, she started to behave more impulsively, to at times seem aggressive and have verbal outbursts, severe insomnia, and so APS  recommended the patient be brought to the ER.  In the ER, the patient was seen by Psychiatry and started on new Seroquel. She was held pending geri-psych placement, which was delayed for several months.  Daytime behaviors improved on high dose Seroquel but continues with sundowning behaviors. Added Celexa as mood stabilizer along with Tegretol on 2/14.  Behaviors and nocturnal sleep also improved with the addition of scheduled twice daily Ativan.  Of note patient much more calm after her daughter brought up multiple items from home including a baby doll which the patient "cares for" regularly.   Subjective: Sleeping soundly and therefore did not awaken  Objective: Vitals with BMI 05/26/2020 05/25/2020 05/25/2020  Height - - -  Weight - - -  BMI - - -  Systolic 159 139 694  Diastolic 80 77 70  Pulse 67 79 58    Intake/Output Summary (Last 24 hours) at 05/26/2020 0812 Last data filed at 05/26/2020 0600 Gross per 24 hour  Intake 420 ml  Output 0 ml  Net 420 ml   Filed Weights   05/05/20 2100 05/12/20 2043 05/22/20 0302  Weight: 59 kg 59 kg 61.5 kg    Exam:  Constitutional: Sleeping, appears comfortable  and in no acute distress Respiratory: Anterior lung sounds are clear.  Remains on room air Cardiovascular: S1-S2.  No peripheral edema.  Regular pulse. Abdomen:  LBM 4/10,   Soft and appears to be nontender although patient is sleeping.  Normoactive bowel sounds Neurologic: CN 2-12 grossly intact on visual inspection. nonfocal Psychiatric: Sleeping soundly.  At baseline oriented times name only sniffing and short-term memory deficits to her dementia.     Assessment/Plan: Acute problems: Dementia with behavioral disturbance Continue Seroquel-QTC 2/14 <500; 3/3 Qtc 420 ms Continue Tegretol 400 mg 8 am and 2 pm and 400 mg 6 pm. 3/5 LFTs repeated and are within normal limits-we will check LFTs again in a.m. on 3/15.  Patient is on max dose of Tegretol for a patient of her age so  no room to increase 4/1 adjust Neurontin dose.  Will change to 400 mg daily at 3 PM to help address sundowning symptoms. Vistaril without any significant impact on behavior so was discontinued in favor of Ativan 4/4 decreased Ativan to BID with improvement of alertness-cont Celexa 40 mg daily  2/14 reevaluated by PT/OT and given her status of custodial care not appropriate for acute PT/OT  Pressure injury Right anterior hip/Stage 2 -Continue air mattress -Maximize nutrition  Pressure Injury 05/17/20 Left Stage 2 -  Partial thickness loss of dermis presenting as a shallow open injury with a red, pink wound bed without slough. (Active)  Date First Assessed/Time First Assessed: 05/17/20 1236   Location Orientation: Left  Staging: Stage 2 -  Partial thickness loss of dermis presenting as a shallow open injury with a red, pink wound bed without slough.    Assessments 05/17/2020 12:36 PM 05/25/2020  7:36 PM  Dressing Type Foam - Lift dressing to assess site every shift Foam - Lift dressing to assess site every shift  Dressing -- Clean;Dry;Intact  Dressing Change Frequency -- PRN  Site / Wound Assessment -- Dry;Clean;Red  Peri-wound Assessment -- Intact;Erythema (blanchable)  Drainage Amount -- None  Treatment -- Off loading     No Linked orders to display   Urinary incontinence -Continue regular pericare  Other problems: Moderate to severe protein calorie malnutrition -Continuefeeding supplements Estimated body mass index is 21.88 kg/m as calculated from the following:   Height as of this encounter: 5\' 6"  (1.676 m).   Weight as of this encounter: 61.5 kg.  Transaminitis -Resolved   Data Reviewed: Basic Metabolic Panel: Recent Labs  Lab 05/22/20 0354  NA 141  K 4.0  CL 105  CO2 25  GLUCOSE 110*  BUN 16  CREATININE 0.68  CALCIUM 9.3   Liver Function Tests: No results for input(s): AST, ALT, ALKPHOS, BILITOT, PROT, ALBUMIN in the last 168 hours. No results for input(s):  LIPASE, AMYLASE in the last 168 hours. No results for input(s): AMMONIA in the last 168 hours. CBC: Recent Labs  Lab 05/22/20 0354  WBC 4.2  HGB 11.3*  HCT 34.1*  MCV 95.8  PLT 250   Cardiac Enzymes: No results for input(s): CKTOTAL, CKMB, CKMBINDEX, TROPONINI in the last 168 hours. BNP (last 3 results) No results for input(s): BNP in the last 8760 hours.  ProBNP (last 3 results) No results for input(s): PROBNP in the last 8760 hours.  CBG: No results for input(s): GLUCAP in the last 168 hours.  No results found for this or any previous visit (from the past 240 hour(s)).   Studies: No results found.  Scheduled Meds: . carbamazepine  400 mg Oral BID  And  . carbamazepine  400 mg Oral QPM  . citalopram  40 mg Oral Daily  . enoxaparin (LOVENOX) injection  40 mg Subcutaneous Q24H  . feeding supplement  237 mL Oral BID BM  . gabapentin  400 mg Oral Daily  . LORazepam  0.5 mg Oral BID  . QUEtiapine  100 mg Oral BID  . QUEtiapine  150 mg Oral QHS   Continuous Infusions:  Principal Problem:   Dementia with behavioral disturbance (HCC) Active Problems:   Major depressive disorder, recurrent episode (HCC)   History of asthma, mild, intermittent   Early Onset Dementia with behavioral problem (HCC)   Protein-calorie malnutrition, moderate (HCC)   Transaminitis   Pressure injury of skin   Alzheimer disease (HCC)   Pressure ulcer of right hip, stage 2 (HCC)   Consultants:  Psychiatry  Procedures:  None  Antibiotics: Anti-infectives (From admission, onward)   None       Time spent: 20 minutes    Junious Silk ANP  [facility] 7 am - 330 pm/M-F for direct patient care and secure chat Please refer to Amion for contact info 106 days

## 2020-05-27 DIAGNOSIS — F0391 Unspecified dementia with behavioral disturbance: Secondary | ICD-10-CM | POA: Diagnosis not present

## 2020-05-27 NOTE — Progress Notes (Signed)
CSW spoke with Wandra Mannan, [facility] Director regarding the need for a program change for long term Medicaid - Zack to speak with Dietitian at DSS in attempt to expedite request.  CSW spoke with Antionette at [facility] to provide her with an update - she is to speak with administration to determine if the patient can come to the facility under her community Medicaid temporarily.  Edwin Dada, MSW, LCSW Transitions of Care  Clinical Social Worker II [phone]

## 2020-05-27 NOTE — Progress Notes (Signed)
PROGRESS NOTE    Sherry Hicks  UXL:[id number] DOB: Feb 21, 1960 DOA: 01/27/2020 PCP: [name], DO   Chief Complaint  Patient presents with  . Altered Mental Status  Brief Narrative: 60 year old female with history of Alzheimer's dementia with behavioral disturbance presented with progressively erratic behavior over months.  She was seen on 2/19 and was admitted. As per [id number] had been living in [place] for years until family were informed by neighbors that patient was wandering, appeared unbathed, and she moved to [facility]. Here, she lived with daughter for about 2 years, but in the last few months, she started to behave more impulsively, to at times seem aggressive and have verbal outbursts, severe insomnia, and so APS recommended the patient be brought to the ER. In the ER, the patient was seen by Psychiatry and started on new Seroquel. She was held pending geri-psych placement, which was delayed for several months.   Patient was admitted medication adjusted Daytime behaviors improved on high dose Seroquel but continues with sundowning behaviors, Celexa added as a mood stabilizer along with Tegretol on 2/14. Her behaviors and nocturnal sleep also improved with the addition of scheduled twice daily Ativan.   Subjective: Patient is alert awake continues to speak to herself  On room air.   Assessment & Plan: Dementia with behavioral disturbance/history of depression Continue current high dose Seroquel, Tegretol, Celexa, Ativan and Neurontin.  Monitor QTC intermittently, continue delirium precaution fall precaution supportive care.   Continue PT OT -awaiting on custodial care   History of asthma, mild, intermittent; no wheezing, on room air. Protein-calorie malnutrition, moderate.:  Augment diet.   Transaminitis-monitor LFTs Pressure injury RT ANT HIP STAGE 2-offloading and routine wound care.  Augment nutritional status continue air mattress Urinary incontinence continue  regular.  Care  Diet Order            DIET FINGER FOODS Room service appropriate? Yes; Fluid consistency: Thin  Diet effective now                Patient's Body mass index is 21.88 kg/m.  DVT prophylaxis: enoxaparin (LOVENOX) injection 40 mg Start: 04/28/20 1245 Code Status:   Code Status: Full Code  Family Communication: Plan of care has been discussed with [facility] team caseworker and nursing staff  Status is: Inpatient Remains inpatient appropriate because:Unsafe d/c plan Dispo: Remains inpatient   Patient From: home    Planned Disposition: SNF/NH  Waiting on bed offers  Medically stable for discharge: yes       Unresulted Labs (From admission, onward)         None    Medications reviewed:  Scheduled Meds: . carbamazepine  400 mg Oral BID   And  . carbamazepine  400 mg Oral QPM  . citalopram  40 mg Oral Daily  . enoxaparin (LOVENOX) injection  40 mg Subcutaneous Q24H  . feeding supplement  237 mL Oral BID BM  . gabapentin  400 mg Oral Daily  . LORazepam  0.5 mg Oral BID  . QUEtiapine  100 mg Oral BID  . QUEtiapine  150 mg Oral QHS   Continuous Infusions:  Consultants:see note  Procedures:see note  Antimicrobials: Anti-infectives (From admission, onward)   None     Culture/Microbiology No results found for: SDES, SPECREQUEST, CULT, REPTSTATUS  Other culture-see note  Objective: Vitals: Today's Vitals   05/26/20 2126 05/26/20 2258 05/27/20 0500 05/27/20 0951  BP: 124/70  119/65 121/71  Pulse: 64  62 68  Resp: 18  18 18  Temp: 99.4 F (37.4 C)  98.3 F (36.8 C) 98.6 F (37 C)  TempSrc:    Oral  SpO2: 100%  100% 100%  Weight:      Height:      PainSc:  0-No pain      Intake/Output Summary (Last 24 hours) at 05/27/2020 1121 Last data filed at 05/27/2020 0900 Gross per 24 hour  Intake 940 ml  Output 0 ml  Net 940 ml   Filed Weights   05/05/20 2100 05/12/20 2043 05/22/20 0302  Weight: 59 kg 59 kg 61.5 kg   Weight change:   Intake/Output from  previous day: 04/13 0701 - 04/14 0700 In: 1000 [P.O.:1000] Out: 0  Intake/Output this shift: Total I/O In: 240 [P.O.:240] Out: 0  Filed Weights   05/05/20 2100 05/12/20 2043 05/22/20 0302  Weight: 59 kg 59 kg 61.5 kg    Examination:  General exam: Alert awake, comfortable not in distress speaking to herself. HEENT:Oral mucosa moist, Ear/Nose WNL grossly, dentition normal. Respiratory system: bilaterally clear no wheezing crackles.   Cardiovascular system: S1 & S2 +, No JVD,. Gastrointestinal system: Abdomen soft, NT,ND, BS+ Nervous System:Alert, awake, moving extremities and grossly nonfocal, in fetal position.oriented to name only, Extremities: No edema, distal peripheral pulses palpable.  Skin: No rashes,no icterus.  Bedside as above MSK: Normal muscle bulk,tone, power.  Data Reviewed: I have personally reviewed following labs and imaging studies CBC: Recent Labs  Lab 05/22/20 0354  WBC 4.2  HGB 11.3*  HCT 34.1*  MCV 95.8  PLT 250   Basic Metabolic Panel: Recent Labs  Lab 05/22/20 0354  NA 141  K 4.0  CL 105  CO2 25  GLUCOSE 110*  BUN 16  CREATININE 0.68  CALCIUM 9.3   GFR: Estimated Creatinine Clearance: 70.9 mL/min (by C-G formula based on SCr of 0.68 mg/dL). Liver Function Tests: No results for input(s): AST, ALT, ALKPHOS, BILITOT, PROT, ALBUMIN in the last 168 hours. No results for input(s): LIPASE, AMYLASE in the last 168 hours. No results for input(s): AMMONIA in the last 168 hours. Coagulation Profile: No results for input(s): INR, PROTIME in the last 168 hours. Cardiac Enzymes: No results for input(s): CKTOTAL, CKMB, CKMBINDEX, TROPONINI in the last 168 hours. BNP (last 3 results) No results for input(s): PROBNP in the last 8760 hours. HbA1C: No results for input(s): HGBA1C in the last 72 hours. CBG: No results for input(s): GLUCAP in the last 168 hours. Lipid Profile: No results for input(s): CHOL, HDL, LDLCALC, TRIG, CHOLHDL, LDLDIRECT in  the last 72 hours. Thyroid Function Tests: No results for input(s): TSH, T4TOTAL, FREET4, T3FREE, THYROIDAB in the last 72 hours. Anemia Panel: No results for input(s): VITAMINB12, FOLATE, FERRITIN, TIBC, IRON, RETICCTPCT in the last 72 hours. Sepsis Labs: No results for input(s): PROCALCITON, LATICACIDVEN in the last 168 hours.  No results found for this or any previous visit (from the past 240 hour(s)).   Radiology Studies: No results found.   LOS: 53 days   Lanae Boast, MD [facility]  05/27/2020, 11:21 AM

## 2020-05-28 DIAGNOSIS — F0391 Unspecified dementia with behavioral disturbance: Secondary | ICD-10-CM | POA: Diagnosis not present

## 2020-05-28 MED ORDER — QUETIAPINE FUMARATE 50 MG PO TABS: 150.0000 mg | ORAL_TABLET | Freq: Every day | ORAL | Status: DC

## 2020-05-28 MED ORDER — QUETIAPINE FUMARATE 100 MG PO TABS: 100.0000 mg | ORAL_TABLET | Freq: Two times a day (BID) | ORAL | Status: DC

## 2020-05-28 MED ORDER — CARBAMAZEPINE 200 MG PO TABS: 400.0000 mg | ORAL_TABLET | Freq: Two times a day (BID) | ORAL | Status: DC

## 2020-05-28 MED ORDER — LORAZEPAM 0.5 MG PO TABS: 0.5000 mg | ORAL_TABLET | Freq: Two times a day (BID) | ORAL | 0 refills | Status: DC

## 2020-05-28 MED ORDER — GABAPENTIN 400 MG PO CAPS: 400.0000 mg | ORAL_CAPSULE | Freq: Two times a day (BID) | ORAL | Status: DC

## 2020-05-28 MED ORDER — CARBAMAZEPINE 200 MG PO TABS: 400.0000 mg | ORAL_TABLET | Freq: Every evening | ORAL | Status: DC

## 2020-05-28 MED ORDER — CITALOPRAM HYDROBROMIDE 40 MG PO TABS: 40.0000 mg | ORAL_TABLET | Freq: Every day | ORAL | Status: DC

## 2020-05-28 NOTE — Progress Notes (Signed)
[facility] PROGRESS NOTE  Sherry Hicks [id number]:[id number] DOB: Mar 13, 1960 DOA: 01/27/2020 PCP: [name], DO       03/29/2020                        04/12/2020   Status: The patient will require care spanning > 2 midnights and should be moved to inpatient because: Altered mental status and Unsafe d/c plan -difficult to place secondary to need for skilled nursing facility secondary to severe dementia.   Dispo:               The patient is from: Home              Anticipated d/c is to: SNF              Anticipated d/c date is: > 3 days              Barriers to discharge: Still awaiting SNF bed offer and patient remains medically stable for discharge   Difficult to place patient Yes  Level of care: Med-Surg  Code Status: Full Family Communication: 4/1 Spoke w/ dtr Sherry Hicks DVT prophylaxis: Ambulatory and low risk for DVT Vaccination status: Not vaccinated.  Foley catheter: No  HPI: 60 y.o.Fwith Alzheimer's dementia with behavioral disturbance who presented with progressively erratic behavior over months.  See longer summary from H&P on 2/9, but in brief, patient had been living in [place] for years until family were informed by neighbors that patient was wandering, appeared unbathed, and she moved to [facility]. Here, she lived with daughter for about 2 years, but in the last few months, she started to behave more impulsively, to at times seem aggressive and have verbal outbursts, severe insomnia, and so APS recommended the patient be brought to the ER.  In the ER, the patient was seen by Psychiatry and started on new Seroquel. She was held pending geri-psych placement, which was delayed for several months.  Daytime behaviors improved on high dose Seroquel but continues with sundowning behaviors. Added Celexa as mood stabilizer along with Tegretol on 2/14.  Behaviors and nocturnal sleep also improved with the addition of scheduled twice daily Ativan.  Of note patient much  more calm after her daughter brought up multiple items from home including a baby doll which the patient "cares for" regularly.   Subjective: Awake.  Patient flexed in fetal position.  I asked her if she was cold.  She stated no but she said "I am going to go back to sleep now"  Objective: Vitals with BMI 05/28/2020 05/27/2020 05/27/2020  Height - - -  Weight - - -  BMI - - -  Systolic 126 112 578  Diastolic 89 82 71  Pulse 73 94 68    Intake/Output Summary (Last 24 hours) at 05/28/2020 0814 Last data filed at 05/28/2020 0745 Gross per 24 hour  Intake 1200 ml  Output 0 ml  Net 1200 ml   Filed Weights   05/05/20 2100 05/12/20 2043 05/22/20 0302  Weight: 59 kg 59 kg 61.5 kg    Exam:  Constitutional: Awake, calm.  Appears to be comfortable Respiratory: Anterior lung sounds are clear.  Stable on room air. Cardiovascular: S1-S2, regular pulse, no peripheral edema Abdomen:  LBM 4/13, soft nontender with normoactive bowel sounds. Neurologic: CN 2-12 grossly intact on visual inspection. nonfocal Psychiatric: Awake and oriented times name only.  Not restless or agitated.     Assessment/Plan: Acute problems: Dementia with behavioral  disturbance Continue Seroquel-QTC 2/14 <500; 3/3 Qtc 420 ms Continue Tegretol 400 mg 8 am and 2 pm and 400 mg 6 pm.  Patient is on max dose of Tegretol  Continue Neurontin 400 mg daily at 3 PM to help address sundowning symptoms. Continue low-dose Ativan BID and Celexa 40 mg daily  2/14 reevaluated by PT/OT and given her status of custodial care not appropriate for acute PT/OT  Pressure injury Right anterior hip/Stage 2 -Continue air mattress -Maximize nutrition  Pressure Injury 05/17/20 Left Stage 2 -  Partial thickness loss of dermis presenting as a shallow open injury with a red, pink wound bed without slough. (Active)  Date First Assessed/Time First Assessed: 05/17/20 1236   Location Orientation: Left  Staging: Stage 2 -  Partial thickness loss  of dermis presenting as a shallow open injury with a red, pink wound bed without slough.    Assessments 05/17/2020 12:36 PM 05/27/2020  8:33 PM  Dressing Type Foam - Lift dressing to assess site every shift Foam - Lift dressing to assess site every shift  Dressing -- Clean;Dry;Intact     No Linked orders to display   Urinary incontinence -Continue regular pericare  Other problems: Moderate to severe protein calorie malnutrition -Continuefeeding supplements Estimated body mass index is 21.88 kg/m as calculated from the following:   Height as of this encounter: 5\' 6"  (1.676 m).   Weight as of this encounter: 61.5 kg.  Transaminitis -Resolved   Data Reviewed: Basic Metabolic Panel: Recent Labs  Lab 05/22/20 0354  NA 141  K 4.0  CL 105  CO2 25  GLUCOSE 110*  BUN 16  CREATININE 0.68  CALCIUM 9.3   Liver Function Tests: No results for input(s): AST, ALT, ALKPHOS, BILITOT, PROT, ALBUMIN in the last 168 hours. No results for input(s): LIPASE, AMYLASE in the last 168 hours. No results for input(s): AMMONIA in the last 168 hours. CBC: Recent Labs  Lab 05/22/20 0354  WBC 4.2  HGB 11.3*  HCT 34.1*  MCV 95.8  PLT 250   Cardiac Enzymes: No results for input(s): CKTOTAL, CKMB, CKMBINDEX, TROPONINI in the last 168 hours. BNP (last 3 results) No results for input(s): BNP in the last 8760 hours.  ProBNP (last 3 results) No results for input(s): PROBNP in the last 8760 hours.  CBG: No results for input(s): GLUCAP in the last 168 hours.  No results found for this or any previous visit (from the past 240 hour(s)).   Studies: No results found.  Scheduled Meds: . carbamazepine  400 mg Oral BID   And  . carbamazepine  400 mg Oral QPM  . citalopram  40 mg Oral Daily  . enoxaparin (LOVENOX) injection  40 mg Subcutaneous Q24H  . feeding supplement  237 mL Oral BID BM  . gabapentin  400 mg Oral Daily  . LORazepam  0.5 mg Oral BID  . QUEtiapine  100 mg Oral BID  .  QUEtiapine  150 mg Oral QHS   Continuous Infusions:  Principal Problem:   Dementia with behavioral disturbance (HCC) Active Problems:   Major depressive disorder, recurrent episode (HCC)   History of asthma, mild, intermittent   Early Onset Dementia with behavioral problem (HCC)   Protein-calorie malnutrition, moderate (HCC)   Transaminitis   Pressure injury of skin   Alzheimer disease (HCC)   Pressure ulcer of right hip, stage 2 (HCC)   Consultants:  Psychiatry  Procedures:  None  Antibiotics: Anti-infectives (From admission, onward)   None  Time spent: 20 minutes    Junious Silk ANP  [facility] 7 am - 330 pm/M-F for direct patient care and secure chat Please refer to Amion for contact info 106 days

## 2020-05-28 NOTE — Plan of Care (Signed)
  Problem: Health Behavior/Discharge Planning: Goal: Ability to manage health-related needs will improve Outcome: Progressing   Problem: Coping: Goal: Level of anxiety will decrease Outcome: Progressing   Problem: Safety: Goal: Ability to remain free from injury will improve Outcome: Progressing   Problem: Skin Integrity: Goal: Risk for impaired skin integrity will decrease Outcome: Progressing   

## 2020-05-28 NOTE — Progress Notes (Signed)
CSW spoke with Wandra Mannan who states he is willing to offer an LOG for room and board for placement at [facility] until her long term Medicaid is approved.  CSW notified Antionette at [facility] of information.  Edwin Dada, MSW, LCSW Transitions of Care  Clinical Social Worker II [phone]

## 2020-05-29 DIAGNOSIS — F0391 Unspecified dementia with behavioral disturbance: Secondary | ICD-10-CM | POA: Diagnosis not present

## 2020-05-29 NOTE — Progress Notes (Addendum)
PROGRESS NOTE    Sherry Hicks  VPX:[id number] DOB: 1960-09-12 DOA: 01/27/2020 PCP: [name], DO   Chief Complaint  Patient presents with  . Altered Mental Status  Brief Narrative: 60 year old female with PMH of Chronic Dementia with behavioral disturbances who presented to the ED on 2/19 with altered mental status and behavioral issues including agitation, screaming, and visual hallucinations.  Patient has been seen by Psychiatry multiple times in 01/2020 and 02/2020.  Patient is on new med Seroquel, Tegretol, Celexa, and Ativan per Psychiatry.  Discharge is pending placement to [facility].  Subjective: Patient is doing fine. She becomes agitated easily. She continues to have visual hallucinations.   Assessment & Plan: Chronic Demential with Behavioral Disturbances: - Continue Seroquel 100 mg at 8am, 4pm, and 150 mg at 10pm.  04/15/20 QTc is 420 ms.  Repeat EKG in am. - Continue Tegretol 400 mg at 8am, 2pm, and 8pm. - Continue Celexa 40 mg at 1030am. - Continue Gabapentin 400 mg at 3pm. - Continue Ativan 0.5 mg at 10am and 10pm.  There is room to up-titrate Ativan if needed.  History of asthma, mild, intermittent; no wheezing, on room air. Protein-calorie malnutrition, moderate.:  Augment diet.   Transaminitis-monitor LFTs Pressure injury RT ANT HIP STAGE 2-offloading and routine wound care.  Augment nutritional status continue air mattress Urinary incontinence continue regular.  Care  Diet Order            DIET FINGER FOODS Room service appropriate? Yes; Fluid consistency: Thin  Diet effective now                Patient's Body mass index is 21.88 kg/m.  DVT prophylaxis: enoxaparin (LOVENOX) injection 40 mg Start: 04/28/20 1245 Code Status:   Code Status: Full Code  Family Communication: Plan of care has been discussed with [facility] team caseworker and nursing staff  Status is: Inpatient Remains inpatient appropriate because:Unsafe d/c plan Dispo:  Remains inpatient   Patient From: home    Planned Disposition: Discharge is planned for [facility].  Medically stable for discharge: yes       Unresulted Labs (From admission, onward)         None    Medications reviewed:  Scheduled Meds: . carbamazepine  400 mg Oral BID   And  . carbamazepine  400 mg Oral QPM  . citalopram  40 mg Oral Daily  . enoxaparin (LOVENOX) injection  40 mg Subcutaneous Q24H  . feeding supplement  237 mL Oral BID BM  . gabapentin  400 mg Oral Daily  . LORazepam  0.5 mg Oral BID  . QUEtiapine  100 mg Oral BID  . QUEtiapine  150 mg Oral QHS   Continuous Infusions:  Consultants:see note  Procedures:see note  Antimicrobials: Anti-infectives (From admission, onward)   None     Culture/Microbiology No results found for: SDES, SPECREQUEST, CULT, REPTSTATUS  Other culture-see note  Objective: Vitals: Today's Vitals   05/28/20 1700 05/28/20 2111 05/28/20 2200 05/29/20 0911  BP:  (!) 103/55    Pulse:  68    Resp:  15    Temp:  97.9 F (36.6 C)    TempSrc:  Oral    SpO2:  97%    Weight:      Height:      PainSc: 0-No pain  0-No pain Asleep    Intake/Output Summary (Last 24 hours) at 05/29/2020 1649 Last data filed at 05/29/2020 1200 Gross per 24 hour  Intake  860 ml  Output --  Net 860 ml   Filed Weights   05/05/20 2100 05/12/20 2043 05/22/20 0302  Weight: 59 kg 59 kg 61.5 kg   Weight change:   Intake/Output from previous day: 04/15 0701 - 04/16 0700 In: 1100 [P.O.:1100] Out: -  Intake/Output this shift: Total I/O In: 360 [P.O.:360] Out: -  Filed Weights   05/05/20 2100 05/12/20 2043 05/22/20 0302  Weight: 59 kg 59 kg 61.5 kg    Examination:  General exam: Alert awake, comfortable not in distress speaking to herself. HEENT:Oral mucosa moist, Ear/Nose WNL grossly, dentition normal. Respiratory system: bilaterally clear no wheezing crackles.   Cardiovascular system: S1 & S2 +, No JVD,. Gastrointestinal system:  Abdomen soft, NT,ND, BS+ Nervous System:Alert, awake, moving extremities and grossly nonfocal, in fetal position.oriented to name only, Extremities: No edema, distal peripheral pulses palpable.  Skin: No rashes,no icterus.  Bedside as above MSK: Normal muscle bulk,tone, power.  Data Reviewed: I have personally reviewed following labs and imaging studies CBC: No results for input(s): WBC, NEUTROABS, HGB, HCT, MCV, PLT in the last 168 hours. Basic Metabolic Panel: No results for input(s): NA, K, CL, CO2, GLUCOSE, BUN, CREATININE, CALCIUM, MG, PHOS in the last 168 hours. GFR: Estimated Creatinine Clearance: 70.9 mL/min (by C-G formula based on SCr of 0.68 mg/dL). Liver Function Tests: No results for input(s): AST, ALT, ALKPHOS, BILITOT, PROT, ALBUMIN in the last 168 hours. No results for input(s): LIPASE, AMYLASE in the last 168 hours. No results for input(s): AMMONIA in the last 168 hours. Coagulation Profile: No results for input(s): INR, PROTIME in the last 168 hours. Cardiac Enzymes: No results for input(s): CKTOTAL, CKMB, CKMBINDEX, TROPONINI in the last 168 hours. BNP (last 3 results) No results for input(s): PROBNP in the last 8760 hours. HbA1C: No results for input(s): HGBA1C in the last 72 hours. CBG: No results for input(s): GLUCAP in the last 168 hours. Lipid Profile: No results for input(s): CHOL, HDL, LDLCALC, TRIG, CHOLHDL, LDLDIRECT in the last 72 hours. Thyroid Function Tests: No results for input(s): TSH, T4TOTAL, FREET4, T3FREE, THYROIDAB in the last 72 hours. Anemia Panel: No results for input(s): VITAMINB12, FOLATE, FERRITIN, TIBC, IRON, RETICCTPCT in the last 72 hours. Sepsis Labs: No results for input(s): PROCALCITON, LATICACIDVEN in the last 168 hours.  No results found for this or any previous visit (from the past 240 hour(s)).   Radiology Studies: No results found.   LOS: 55 days   Baldwin Jamaica, MD [facility]  05/29/2020, 4:49 PM

## 2020-05-30 DIAGNOSIS — F0391 Unspecified dementia with behavioral disturbance: Secondary | ICD-10-CM | POA: Diagnosis not present

## 2020-05-30 NOTE — Progress Notes (Signed)
PROGRESS NOTE    Sherry Hicks  [id number] DOB: March 28, 1960 DOA: 01/27/2020 PCP: [name], DO   Chief Complaint  Patient presents with  . Altered Mental Status  Brief Narrative: 60 year old female with PMH of Chronic Dementia with behavioral disturbances who presented to the ED on 2/19 with altered mental status and behavioral issues including agitation, screaming, and visual hallucinations.  Patient has been seen by Psychiatry multiple times in 01/2020 and 02/2020.  Patient is on new med Seroquel, Tegretol, Celexa, and Ativan per Psychiatry.  Discharge is planned for [facility].  Medicaid application is in process.    Subjective: There were no episodes reported overnight. Patient is doing fine this am.   Repeatedly states "I am ready to go to my mama's house."  Assessment & Plan: Chronic Dementia with Behavioral Disturbances: - Continue Seroquel 100 mg at 8am, 4pm, and 150 mg at 10pm.  04/15/20 QTc is 420 ms.  Repeat EKG is pending.  Nursing reports no urinary symptoms or issues with bowel movements, which are common side effects of antipsychotics. - Continue Tegretol 400 mg at 8am, 2pm, and 8pm. - Continue Celexa 40 mg at 1030am. - Continue Gabapentin 400 mg at 3pm. - Continue Ativan 0.5 mg at 10am and 10pm.  There is room to up-titrate Ativan if needed.  History of asthma, mild, intermittent, not in acute exacberation.   - Give Albuterol PRN. Protein-calorie malnutrition, moderate: Albumin is 3.1 g/dL. - Continue dietary supplements. Pressure injury, Stage 2, POA: - Continue offloading and wound care.    Diet Order            DIET FINGER FOODS Room service appropriate? Yes; Fluid consistency: Thin  Diet effective now                Patient's Body mass index is 21.88 kg/m.  DVT prophylaxis: enoxaparin (LOVENOX) injection 40 mg Start: 04/28/20 1245 Code Status:   Code Status: Full Code  Family Communication: Plan of care has been discussed with [facility]  team caseworker and nursing staff  Status is: Inpatient Remains inpatient appropriate because:Unsafe d/c plan Dispo: Remains inpatient   Patient From: home    Planned Disposition: Discharge is planned for [facility].  Medicaid application is in process.  Medically stable for discharge: yes       Unresulted Labs (From admission, onward)         None    Medications reviewed:  Scheduled Meds: . carbamazepine  400 mg Oral BID   And  . carbamazepine  400 mg Oral QPM  . citalopram  40 mg Oral Daily  . enoxaparin (LOVENOX) injection  40 mg Subcutaneous Q24H  . feeding supplement  237 mL Oral BID BM  . gabapentin  400 mg Oral Daily  . LORazepam  0.5 mg Oral BID  . QUEtiapine  100 mg Oral BID  . QUEtiapine  150 mg Oral QHS   Continuous Infusions:  Consultants:see note  Procedures:see note  Antimicrobials: Anti-infectives (From admission, onward)   None     Culture/Microbiology No results found for: SDES, SPECREQUEST, CULT, REPTSTATUS  Other culture-see note  Objective: Vitals: Today's Vitals   05/29/20 2159 05/29/20 2200 05/30/20 0400 05/30/20 0730  BP: 117/67  (!) 159/89   Pulse: 72  (!) 58   Resp: 18  18   Temp: 98.1 F (36.7 C)  97.9 F (36.6 C)   TempSrc: Axillary  Axillary   SpO2: 98%  100%   Weight:  Height:      PainSc:  Asleep  Asleep    Intake/Output Summary (Last 24 hours) at 05/30/2020 1519 Last data filed at 05/30/2020 1201 Gross per 24 hour  Intake 580 ml  Output --  Net 580 ml   Filed Weights   05/05/20 2100 05/12/20 2043 05/22/20 0302  Weight: 59 kg 59 kg 61.5 kg   Weight change:   Intake/Output from previous day: 04/16 0701 - 04/17 0700 In: 600 [P.O.:600] Out: -  Intake/Output this shift: Total I/O In: 340 [P.O.:340] Out: -  Filed Weights   05/05/20 2100 05/12/20 2043 05/22/20 0302  Weight: 59 kg 59 kg 61.5 kg    Examination:  General exam: Alert awake, no in acute distress, thin and malnourished, mumbles phrases  to herself. HEENT:Oral mucosa moist, Ear/Nose WNL grossly, dentition normal. Respiratory system: CTAB, no WRR  Cardiovascular system: did not appreciate a murmur, no JVD. Gastrointestinal system: Abdomen soft, NT,ND, BS+ Nervous System: No focal deficits. Extremities: No edema. Pulses are intact. Skin: No rashes or bruises. MSK: Decreased muscle tone.  Data Reviewed: I have personally reviewed following labs and imaging studies  No results found for this or any previous visit (from the past 240 hour(s)).   Radiology Studies: No results found.   LOS: 56 days   Baldwin Jamaica, MD [facility]  05/30/2020, 3:19 PM

## 2020-05-31 DIAGNOSIS — F0391 Unspecified dementia with behavioral disturbance: Secondary | ICD-10-CM | POA: Diagnosis not present

## 2020-05-31 NOTE — Progress Notes (Signed)
CSW spoke with Antionette at [facility] who states she will speak with administration regarding the LOG for room and board until the patient's long term Medicaid is approved.  CSW sent LOG request for review.  Edwin Dada, MSW, LCSW Transitions of Care  Clinical Social Worker II [phone]

## 2020-05-31 NOTE — Progress Notes (Signed)
PROGRESS NOTE    Sherry Hicks  PNT:[id number] DOB: 11-22-1960 DOA: 01/27/2020 PCP: [name], DO   Chief Complaint  Patient presents with  . Altered Mental Status  Brief Narrative: 60 year old female with PMH of Chronic Dementia with behavioral disturbances who presented to the ED on 2/19 with altered mental status and behavioral issues including agitation, screaming, and visual hallucinations.  Patient has been seen by Psychiatry multiple times in 01/2020 and 02/2020.  Patient is on new med Seroquel, Tegretol, Celexa, and Ativan per Psychiatry.  Discharge is planned for [facility].  Medicaid application is in process (long term process).    Subjective: No acute episodes reported overnight. Patient is talking to a person named "Todd" and repeating the phrase "I got to go" and "Get off my sh**!" She is doing well otherwise. BM and UO are normal.  Assessment & Plan: Chronic Dementia with Behavioral Disturbances: - Continue Seroquel 100 mg at 8am, 4pm, and 150 mg at 10pm.  04/15/20 QTc is 420 ms.  Repeat EKG is pending.  Nursing reports no urinary symptoms or issues with bowel movements, which are common side effects of antipsychotics that we will monitor. - Continue Tegretol 400 mg at 8am, 2pm, and 8pm. - Continue Celexa 40 mg at 1030am. - Continue Gabapentin 400 mg at 3pm. - Continue Ativan 0.5 mg at 10am and 10pm.  There is room to up-titrate Ativan if needed.  History of asthma, mild, intermittent, not in acute exacberation.   - Give Albuterol PRN. Protein-calorie malnutrition, moderate: Albumin is 3.1 g/dL. - Continue dietary supplements. Pressure injury, Stage 2, POA: - Continue offloading and wound care.    Diet Order            DIET FINGER FOODS Room service appropriate? Yes; Fluid consistency: Thin  Diet effective now                Patient's Body mass index is 21.88 kg/m.  DVT prophylaxis: enoxaparin (LOVENOX) injection 40 mg Start: 04/28/20  1245 Code Status:   Code Status: Full Code  Family Communication: Plan of care has been discussed with LLOS team caseworker and nursing staff  Status is: Inpatient Remains inpatient appropriate because:Unsafe d/c plan Dispo: Remains inpatient   Patient From: home    Planned Disposition: Discharge is planned for [facility].  Medicaid application is in process (long term process).  Medically stable for discharge: yes       Unresulted Labs (From admission, onward)         None    Medications reviewed:  Scheduled Meds: . carbamazepine  400 mg Oral BID   And  . carbamazepine  400 mg Oral QPM  . citalopram  40 mg Oral Daily  . enoxaparin (LOVENOX) injection  40 mg Subcutaneous Q24H  . feeding supplement  237 mL Oral BID BM  . gabapentin  400 mg Oral Daily  . LORazepam  0.5 mg Oral BID  . QUEtiapine  100 mg Oral BID  . QUEtiapine  150 mg Oral QHS   Continuous Infusions:  Consultants:see note  Procedures:see note  Antimicrobials: Anti-infectives (From admission, onward)   None     Culture/Microbiology No results found for: SDES, SPECREQUEST, CULT, REPTSTATUS  Other culture-see note  Objective: Vitals: Today's Vitals   05/30/20 0400 05/30/20 0730 05/30/20 2055 05/31/20 0500  BP: (!) 159/89  108/68 110/70  Pulse: (!) 58  89 90  Resp: 18  16 18   Temp: 97.9 F (36.6 C)  97.9 F (36.6 C) 98.2 F (36.8 C)  TempSrc: Axillary  Oral Oral  SpO2: 100%  97%   Weight:      Height:      PainSc:  Asleep Asleep     Intake/Output Summary (Last 24 hours) at 05/31/2020 1433 Last data filed at 05/30/2020 1743 Gross per 24 hour  Intake 120 ml  Output --  Net 120 ml   Filed Weights   05/05/20 2100 05/12/20 2043 05/22/20 0302  Weight: 59 kg 59 kg 61.5 kg   Weight change:   Intake/Output from previous day: 04/17 0701 - 04/18 0700 In: 460 [P.O.:460] Out: -  Intake/Output this shift: No intake/output data recorded. Filed Weights   05/05/20 2100 05/12/20 2043  05/22/20 0302  Weight: 59 kg 59 kg 61.5 kg    Examination:  General exam: Alert awake, no in acute distress, thin and malnourished, mumbles phrases to herself. HEENT:Oral mucosa moist, Ear/Nose WNL grossly, dentition normal. Respiratory system: CTAB, no WRR  Cardiovascular system: did not appreciate a murmur, no JVD. Gastrointestinal system: Abdomen soft, NT,ND, BS+ Nervous System: No focal deficits. Extremities: No edema. Pulses are intact. Skin: No rashes or bruises. MSK: Decreased muscle tone.  Data Reviewed: I have personally reviewed following labs and imaging studies  No results found for this or any previous visit (from the past 240 hour(s)).   Radiology Studies: No results found.   LOS: 57 days   Baldwin Jamaica, MD [facility]  05/31/2020, 2:33 PM

## 2020-06-01 DIAGNOSIS — F0391 Unspecified dementia with behavioral disturbance: Secondary | ICD-10-CM | POA: Diagnosis not present

## 2020-06-01 NOTE — Progress Notes (Signed)
[facility] PROGRESS NOTE  Sherry Hicks VPX:[id number] DOB: 1960/03/09 DOA: 01/27/2020 PCP: [name], DO       03/29/2020                        04/12/2020   Status: The patient will require care spanning > 2 midnights and should be moved to inpatient because: Altered mental status and Unsafe d/c plan -difficult to place secondary to need for skilled nursing facility secondary to severe dementia.   Dispo:               The patient is from: Home              Anticipated d/c is to: SNF              Anticipated d/c date is: > 3 days              Barriers to discharge: Still awaiting SNF bed offer and patient remains medically stable for discharge   Difficult to place patient Yes  Level of care: Med-Surg  Code Status: Full Family Communication: 4/1 Spoke w/ dtr Sherry Hicks DVT prophylaxis: Ambulatory and low risk for DVT Vaccination status: Not vaccinated.  Foley catheter: No  HPI: 60 y.o.Fwith Alzheimer's dementia with behavioral disturbance who presented with progressively erratic behavior over months.  See longer summary from H&P on 2/9, but in brief, patient had been living in [place] for years until family were informed by neighbors that patient was wandering, appeared unbathed, and she moved to [facility]. Here, she lived with daughter for about 2 years, but in the last few months, she started to behave more impulsively, to at times seem aggressive and have verbal outbursts, severe insomnia, and so APS recommended the patient be brought to the ER.  In the ER, the patient was seen by Psychiatry and started on new Seroquel. She was held pending geri-psych placement, which was delayed for several months.  Daytime behaviors improved on high dose Seroquel but continues with sundowning behaviors. Added Celexa as mood stabilizer along with Tegretol on 2/14.  Behaviors and nocturnal sleep also improved with the addition of scheduled twice daily Ativan.  Of note patient much  more calm after her daughter brought up multiple items from home including a baby doll which the patient "cares for" regularly.   Subjective: Alert.  No specific complaints verbalized.  Objective: Vitals with BMI 06/01/2020 05/31/2020 05/31/2020  Height - - -  Weight - - -  BMI - - -  Systolic 110 110 462  Diastolic 60 66 70  Pulse 74 76 85    Intake/Output Summary (Last 24 hours) at 06/01/2020 0820 Last data filed at 06/01/2020 0600 Gross per 24 hour  Intake 600 ml  Output --  Net 600 ml   Filed Weights   05/05/20 2100 05/12/20 2043 05/22/20 0302  Weight: 59 kg 59 kg 61.5 kg    Exam:  Constitutional: Currently calm.  No acute distress.  Appears comfortable. Respiratory: Anterior lung sounds are clear and there is no increased work of breathing.  Stable on room air. Cardiovascular: Normal heart sounds.  Normotensive.  Skin warm and dry. Abdomen:  LBM 4/13, soft nontender with normoactive bowel sounds. Neurologic: CN 2-12 grossly intact on visual inspection. nonfocal Psychiatric: Alert and oriented times name only.  Currently has pleasant interactive affect.  Very confused which is her baseline.     Assessment/Plan: Acute problems: Dementia with behavioral disturbance Continue Seroquel-QTC 2/14 <  500; 3/3 Qtc 420 ms Continue Tegretol 400 mg 8 am and 2 pm and 400 mg 6 pm.  Patient is on max dose of Tegretol  Continue Neurontin 400 mg daily at 3 PM to help address sundowning symptoms. Continue low-dose Ativan BID and Celexa 40 mg daily  2/14 reevaluated by PT/OT and given her status of custodial care not appropriate for acute PT/OT  Pressure injury Right anterior hip/Stage 2 -Continue air mattress -Maximize nutrition  Pressure Injury 05/17/20 Left Stage 2 -  Partial thickness loss of dermis presenting as a shallow open injury with a red, pink wound bed without slough. (Active)  Date First Assessed/Time First Assessed: 05/17/20 1236   Location Orientation: Left  Staging:  Stage 2 -  Partial thickness loss of dermis presenting as a shallow open injury with a red, pink wound bed without slough.    Assessments 05/17/2020 12:36 PM 05/30/2020  9:00 PM  Dressing Type Foam - Lift dressing to assess site every shift --  Site / Wound Assessment -- Clean;Dry  Treatment -- Off loading     No Linked orders to display   Urinary incontinence -Continue regular pericare  Other problems: Moderate to severe protein calorie malnutrition -Continuefeeding supplements Estimated body mass index is 21.88 kg/m as calculated from the following:   Height as of this encounter: 5\' 6"  (1.676 m).   Weight as of this encounter: 61.5 kg.  Transaminitis -Resolved   Data Reviewed: Basic Metabolic Panel: No results for input(s): NA, K, CL, CO2, GLUCOSE, BUN, CREATININE, CALCIUM, MG, PHOS in the last 168 hours. Liver Function Tests: No results for input(s): AST, ALT, ALKPHOS, BILITOT, PROT, ALBUMIN in the last 168 hours. No results for input(s): LIPASE, AMYLASE in the last 168 hours. No results for input(s): AMMONIA in the last 168 hours. CBC: No results for input(s): WBC, NEUTROABS, HGB, HCT, MCV, PLT in the last 168 hours. Cardiac Enzymes: No results for input(s): CKTOTAL, CKMB, CKMBINDEX, TROPONINI in the last 168 hours. BNP (last 3 results) No results for input(s): BNP in the last 8760 hours.  ProBNP (last 3 results) No results for input(s): PROBNP in the last 8760 hours.  CBG: No results for input(s): GLUCAP in the last 168 hours.  No results found for this or any previous visit (from the past 240 hour(s)).   Studies: No results found.  Scheduled Meds: . carbamazepine  400 mg Oral BID   And  . carbamazepine  400 mg Oral QPM  . citalopram  40 mg Oral Daily  . enoxaparin (LOVENOX) injection  40 mg Subcutaneous Q24H  . feeding supplement  237 mL Oral BID BM  . gabapentin  400 mg Oral Daily  . LORazepam  0.5 mg Oral BID  . QUEtiapine  100 mg Oral BID  .  QUEtiapine  150 mg Oral QHS   Continuous Infusions:  Principal Problem:   Dementia with behavioral disturbance (HCC) Active Problems:   Major depressive disorder, recurrent episode (HCC)   History of asthma, mild, intermittent   Early Onset Dementia with behavioral problem (HCC)   Protein-calorie malnutrition, moderate (HCC)   Transaminitis   Pressure injury of skin   Alzheimer disease (HCC)   Pressure ulcer of right hip, stage 2 (HCC)   Consultants:  Psychiatry  Procedures:  None  Antibiotics: Anti-infectives (From admission, onward)   None       Time spent: 20 minutes    ANP  [facility] 7 am - 330 pm/M-F for direct patient  care and secure chat Please refer to Amion for contact info 106 days

## 2020-06-01 NOTE — Progress Notes (Addendum)
CSW attempted to speak with Antionette at [facility] - no answer, a voicemail was left requesting a return call. CSW also sent text message requesting a call.  CSW attempted to reach administrator at [facility] - no answer, a voicemail was left requesting a return call.  Edwin Dada, MSW, LCSW Transitions of Care  Clinical Social Worker II [phone]

## 2020-06-01 NOTE — Progress Notes (Addendum)
PROGRESS NOTE    Sherry Hicks  ZOX:[id number] DOB: 02/10/1961 DOA: 01/27/2020 PCP: [name], DO   Chief Complaint  Patient presents with  . Altered Mental Status  Brief Narrative: 60 year old female with PMH of Chronic Dementia with behavioral disturbances who presented to the ED on 2/19 with altered mental status and behavioral issues including agitation, screaming, and visual hallucinations.  Patient has been seen by Psychiatry multiple times in 01/2020 and 02/2020.  Patient is on new med Seroquel, Tegretol, Celexa, and Ativan per Psychiatry.  Discharge is planned for [facility].  Medicaid application is in process (long term process).    Subjective: No acute episodes reported overnight. Patient is yelling "Get the baby" and "Don't back to my damn house" Otherwise is doing well. BM and UO are normal. Case Management is working on placement at [facility].  Assessment & Plan: Chronic Dementia with Behavioral Disturbances: - Continue Seroquel 100 mg at 8am, 4pm, and 150 mg at 10pm.  04/15/20 QTc is 420 ms.  Repeat EKG is pending.  Nursing reports no urinary symptoms or issues with bowel movements, which are common side effects of antipsychotics that we will monitor. - Continue Tegretol 400 mg at 8am, 2pm, and 8pm. - Continue Celexa 40 mg at 1030am. - Continue Gabapentin 400 mg at 3pm. - Continue Ativan 0.5 mg at 10am and 10pm.  There is room to up-titrate Ativan if needed.  History of asthma, mild, intermittent, not in acute exacberation.   - Give Albuterol PRN. Protein-calorie malnutrition, moderate: Albumin is 3.1 g/dL. - Continue dietary supplements. Pressure injury, Stage 2, POA: - Continue offloading and wound care.    Diet Order            DIET FINGER FOODS Room service appropriate? Yes; Fluid consistency: Thin  Diet effective now                Patient's Body mass index is 21.88 kg/m.  DVT prophylaxis: enoxaparin (LOVENOX) injection 40 mg  Start: 04/28/20 1245 Code Status:   Code Status: Full Code  Family Communication: Plan of care has been discussed with LLOS team caseworker and nursing staff  Status is: Inpatient Remains inpatient appropriate because:Unsafe d/c plan Dispo: Remains inpatient   Patient From: home    Planned Disposition: Discharge is planned for [facility]  Medicaid application is in process (long term process).  Medically stable for discharge: yes       Unresulted Labs (From admission, onward)         None    Medications reviewed:  Scheduled Meds: . carbamazepine  400 mg Oral BID   And  . carbamazepine  400 mg Oral QPM  . citalopram  40 mg Oral Daily  . enoxaparin (LOVENOX) injection  40 mg Subcutaneous Q24H  . feeding supplement  237 mL Oral BID BM  . gabapentin  400 mg Oral Daily  . LORazepam  0.5 mg Oral BID  . QUEtiapine  100 mg Oral BID  . QUEtiapine  150 mg Oral QHS   Continuous Infusions:  Consultants:see note  Procedures:see note  Antimicrobials: Anti-infectives (From admission, onward)   None     Culture/Microbiology No results found for: SDES, SPECREQUEST, CULT, REPTSTATUS  Other culture-see note  Objective: Vitals: Today's Vitals   05/31/20 1830 05/31/20 2105 06/01/20 0600 06/01/20 0937  BP: 112/70 110/66 110/60 116/81  Pulse: 85 76 74 72  Resp: 18 16 18 17   Temp: 98.3 F (36.8 C) 98.5 F (  36.9 C) 98.1 F (36.7 C)   TempSrc: Oral  Oral   SpO2:  94% 95% 100%  Weight:      Height:      PainSc:  Asleep      Intake/Output Summary (Last 24 hours) at 06/01/2020 1647 Last data filed at 06/01/2020 1300 Gross per 24 hour  Intake 960 ml  Output --  Net 960 ml   Filed Weights   05/05/20 2100 05/12/20 2043 05/22/20 0302  Weight: 59 kg 59 kg 61.5 kg   Weight change:   Intake/Output from previous day: 04/18 0701 - 04/19 0700 In: 840 [P.O.:840] Out: -  Intake/Output this shift: Total I/O In: 600 [P.O.:600] Out: -  Filed Weights   05/05/20 2100  05/12/20 2043 05/22/20 0302  Weight: 59 kg 59 kg 61.5 kg    Examination:  General exam: Alert awake, no in acute distress, thin and malnourished, mumbles phrases to herself. HEENT:Oral mucosa moist, Ear/Nose WNL grossly, dentition normal. Respiratory system: CTAB, no WRR  Cardiovascular system: did not appreciate a murmur, no JVD. Gastrointestinal system: Abdomen soft, NT,ND, BS+ Nervous System: No focal deficits. Extremities: No edema. Pulses are intact. Skin: No rashes or bruises. MSK: Decreased muscle tone.  Data Reviewed: I have personally reviewed following labs and imaging studies  No results found for this or any previous visit (from the past 240 hour(s)).   Radiology Studies: No results found.   LOS: 58 days   Baldwin Jamaica, MD [facility]  06/01/2020, 4:47 PM

## 2020-06-02 DIAGNOSIS — F0391 Unspecified dementia with behavioral disturbance: Secondary | ICD-10-CM | POA: Diagnosis not present

## 2020-06-02 NOTE — Progress Notes (Signed)
[facility] PROGRESS NOTE  Sherry Hicks [id number]:[id number] DOB: May 14, 1960 DOA: 01/27/2020 PCP: Sherry Jumbo, DO       03/29/2020                        04/12/2020   Status: The patient will require care spanning > 2 midnights and should be moved to inpatient because: Altered mental status and Unsafe d/c plan -difficult to place secondary to need for skilled nursing facility secondary to severe dementia.   Dispo:               The patient is from: Home              Anticipated d/c is to: SNF              Anticipated d/c date is: > 3 days              Barriers to discharge: Still awaiting SNF bed offer and patient remains medically stable for discharge   Difficult to place patient Yes  Level of care: Med-Surg  Code Status: Full Family Communication: 4/1 Spoke w/ dtr Sherry Hicks DVT prophylaxis: Ambulatory and low risk for DVT Vaccination status: Not vaccinated.  Foley catheter: No  HPI: 60 y.o.Fwith Alzheimer's dementia with behavioral disturbance who presented with progressively erratic behavior over months.  See longer summary from H&P on 2/9, but in brief, patient had been living in [place] for years until family were informed by neighbors that patient was wandering, appeared unbathed, and she moved to [facility]. Here, she lived with daughter for about 2 years, but in the last few months, she started to behave more impulsively, to at times seem aggressive and have verbal outbursts, severe insomnia, and so APS recommended the patient be brought to the ER.  In the ER, the patient was seen by Psychiatry and started on new Seroquel. She was held pending geri-psych placement, which was delayed for several months.  Daytime behaviors improved on high dose Seroquel but continues with sundowning behaviors. Added Celexa as mood stabilizer along with Tegretol on 2/14.  Behaviors and nocturnal sleep also improved with the addition of scheduled twice daily Ativan.  Of note patient much  more calm after her daughter brought up multiple items from home including a baby doll which the patient "cares for" regularly.   Subjective: Awake with eyes open.  Not responding verbally although is tracking with her eyes.  Appears to be frustrated or angry.  Objective: Vitals with BMI 06/02/2020 06/02/2020 06/01/2020  Height - - -  Weight - - -  BMI - - -  Systolic 128 128 106  Diastolic 79 79 74  Pulse 60 60 70    Intake/Output Summary (Last 24 hours) at 06/02/2020 0803 Last data filed at 06/01/2020 1700 Gross per 24 hour  Intake 600 ml  Output --  Net 600 ml   Filed Weights   05/05/20 2100 05/12/20 2043 05/22/20 0302  Weight: 59 kg 59 kg 61.5 kg    Exam:  Constitutional: Awake, alert, not communicative today. Respiratory: Lungs are clear.  Stable on room air Cardiovascular: S1-S2, regular pulse, no peripheral edema Abdomen:  LBM 4/17, soft nontender with normoactive bowel sounds. Neurologic: CN 2-12 grossly intact on visual inspection. nonfocal Psychiatric: Awake.  Responds to name but is not communicating today.  Bland somewhat flat affect.     Assessment/Plan: Acute problems: Dementia with behavioral disturbance Continue Seroquel-QTC 2/14 <500; 3/3 Qtc 420 ms Continue Tegretol  400 mg 8 am and 2 pm and 400 mg 6 pm.  Patient is on max dose of Tegretol  Continue Neurontin 400 mg daily at 3 PM to help address sundowning symptoms. Continue low-dose Ativan BID and Celexa 40 mg daily  2/14 reevaluated by PT/OT and given her status of custodial care not appropriate for acute PT/OT  Pressure injury Right anterior hip/Stage 2 -Continue air mattress -According to nursing documentation wound has healed Pressure Injury 05/17/20 Left Stage 2 -  Partial thickness loss of dermis presenting as a shallow open injury with a red, pink wound bed without slough. (Active)  Date First Assessed/Time First Assessed: 05/17/20 1236   Location Orientation: Left  Staging: Stage 2 -  Partial  thickness loss of dermis presenting as a shallow open injury with a red, pink wound bed without slough.    Assessments 05/17/2020 12:36 PM 06/01/2020  9:30 AM  Dressing Type Foam - Lift dressing to assess site every shift None  State of Healing -- Scar  Peri-wound Assessment -- Intact  Margins -- Attached edges (approximated)  Drainage Amount -- None  Treatment -- Off loading     No Linked orders to display   Urinary incontinence -Continue regular pericare  Other problems: Moderate to severe protein calorie malnutrition -Continuefeeding supplements Estimated body mass index is 21.88 kg/m as calculated from the following:   Height as of this encounter: 5\' 6"  (1.676 m).   Weight as of this encounter: 61.5 kg.  Transaminitis -Resolved   Data Reviewed: Basic Metabolic Panel: No results for input(s): NA, K, CL, CO2, GLUCOSE, BUN, CREATININE, CALCIUM, MG, PHOS in the last 168 hours. Liver Function Tests: No results for input(s): AST, ALT, ALKPHOS, BILITOT, PROT, ALBUMIN in the last 168 hours. No results for input(s): LIPASE, AMYLASE in the last 168 hours. No results for input(s): AMMONIA in the last 168 hours. CBC: No results for input(s): WBC, NEUTROABS, HGB, HCT, MCV, PLT in the last 168 hours. Cardiac Enzymes: No results for input(s): CKTOTAL, CKMB, CKMBINDEX, TROPONINI in the last 168 hours. BNP (last 3 results) No results for input(s): BNP in the last 8760 hours.  ProBNP (last 3 results) No results for input(s): PROBNP in the last 8760 hours.  CBG: No results for input(s): GLUCAP in the last 168 hours.  No results found for this or any previous visit (from the past 240 hour(s)).   Studies: No results found.  Scheduled Meds: . carbamazepine  400 mg Oral BID   And  . carbamazepine  400 mg Oral QPM  . citalopram  40 mg Oral Daily  . enoxaparin (LOVENOX) injection  40 mg Subcutaneous Q24H  . feeding supplement  237 mL Oral BID BM  . gabapentin  400 mg Oral Daily   . LORazepam  0.5 mg Oral BID  . QUEtiapine  100 mg Oral BID  . QUEtiapine  150 mg Oral QHS   Continuous Infusions:  Principal Problem:   Dementia with behavioral disturbance (HCC) Active Problems:   Major depressive disorder, recurrent episode (HCC)   History of asthma, mild, intermittent   Early Onset Dementia with behavioral problem (HCC)   Protein-calorie malnutrition, moderate (HCC)   Transaminitis   Pressure injury of skin   Alzheimer disease (HCC)   Pressure ulcer of right hip, stage 2 (HCC)   Consultants:  Psychiatry  Procedures:  None  Antibiotics: Anti-infectives (From admission, onward)   None       Time spent: 20 minutes     ANP  [facility] 7 am - 330 pm/M-F for direct patient care and secure chat Please refer to Amion for contact info 126 days

## 2020-06-02 NOTE — Progress Notes (Addendum)
CSW spoke with Antionette at [facility] - patient's LOG request and clinical information has been sent to corporate for review. Antionette to return call to CSW with updates as they become available.  Edwin Dada, MSW, LCSW Transitions of Care  Clinical Social Worker II [phone]

## 2020-06-02 NOTE — Plan of Care (Signed)
  Problem: Safety: Goal: Ability to remain free from injury will improve Outcome: Progressing   

## 2020-06-03 DIAGNOSIS — F0391 Unspecified dementia with behavioral disturbance: Secondary | ICD-10-CM | POA: Diagnosis not present

## 2020-06-03 NOTE — Progress Notes (Signed)
[facility] PROGRESS NOTE  LYNLEY KILLILEA ALP:[id number] DOB: 1960-11-16 DOA: 01/27/2020 PCP: [name], DO       03/29/2020                        04/12/2020   Status: The patient will require care spanning > 2 midnights and should be moved to inpatient because: Altered mental status and Unsafe d/c plan -difficult to place secondary to need for skilled nursing facility secondary to severe dementia.   Dispo:               The patient is from: Home              Anticipated d/c is to: SNF              Anticipated d/c date is: > 3 days              Barriers to discharge: Still awaiting SNF bed offer and patient remains medically stable for discharge   Difficult to place patient Yes  Level of care: Med-Surg  Code Status: Full Family Communication: 4/1 Spoke w/ dtr [name] DVT prophylaxis: Ambulatory and low risk for DVT Vaccination status: Not vaccinated.  Foley catheter: No  HPI: 60 y.o.Fwith Alzheimer's dementia with behavioral disturbance who presented with progressively erratic behavior over months.  See longer summary from H&P on 2/9, but in brief, patient had been living in [place] for years until family were informed by neighbors that patient was wandering, appeared unbathed, and she moved to [facility]. Here, she lived with daughter for about 2 years, but in the last few months, she started to behave more impulsively, to at times seem aggressive and have verbal outbursts, severe insomnia, and so APS recommended the patient be brought to the ER.  In the ER, the patient was seen by Psychiatry and started on new Seroquel. She was held pending geri-psych placement, which was delayed for several months.  Daytime behaviors improved on high dose Seroquel but continues with sundowning behaviors. Added Celexa as mood stabilizer along with Tegretol on 2/14.  Behaviors and nocturnal sleep also improved with the addition of scheduled twice daily Ativan.  Of note patient much  more calm after her daughter brought up multiple items from home including a baby doll which the patient "cares for" regularly.   Subjective: Awake.  Nonstop verbalization.  Talking to people that are not in the room.  Pleasant and noncombative  Objective: Vitals with BMI 06/03/2020 06/02/2020 06/02/2020  Height - - -  Weight - - -  BMI - - -  Systolic 126 128 353  Diastolic 64 79 79  Pulse 86 60 60    Intake/Output Summary (Last 24 hours) at 06/03/2020 0826 Last data filed at 06/02/2020 2200 Gross per 24 hour  Intake 1294 ml  Output --  Net 1294 ml   Filed Weights   05/05/20 2100 05/12/20 2043 05/22/20 0302  Weight: 59 kg 59 kg 61.5 kg    Exam:  Constitutional:, Calm and extremely talkative.  No acute distress Respiratory: Lungs are clear to auscultation.  She remained stable on room air. Cardiovascular: Normal heart sounds.  No tachycardia and no peripheral edema Abdomen:  LBM 4/17, soft nontender.  Bowel sounds present.  Variable oral intake Neurologic: CN 2-12 grossly intact on visual inspection. nonfocal Psychiatric: Oriented to self only.  Currently confused and appears to be hallucinating and talking to people not in the room  Assessment/Plan: Acute problems: Dementia with behavioral disturbance Continue Seroquel-QTC 2/14 <500; 3/3 Qtc 420 ms Continue Tegretol 400 mg 8 am and 2 pm and 400 mg 6 pm.  Patient is on max dose of Tegretol  Continue Neurontin 400 mg daily at 3 PM to help address sundowning symptoms. Continue low-dose Ativan BID and Celexa 40 mg daily  2/14 reevaluated by PT/OT and given her status of custodial care not appropriate for acute PT/OT  Pressure injury Right anterior hip/Stage 2 -Continue air mattress -According to nursing documentation wound has healed Pressure Injury 05/17/20 Left Stage 2 -  Partial thickness loss of dermis presenting as a shallow open injury with a red, pink wound bed without slough. (Active)  Date First Assessed/Time  First Assessed: 05/17/20 1236   Location Orientation: Left  Staging: Stage 2 -  Partial thickness loss of dermis presenting as a shallow open injury with a red, pink wound bed without slough.    Assessments 05/17/2020 12:36 PM 06/02/2020  8:00 AM  Dressing Type Foam - Lift dressing to assess site every shift None  Dressing -- Changed  Dressing Change Frequency -- PRN  State of Healing -- Scar  Site / Wound Assessment -- Clean;Dry  Peri-wound Assessment -- Intact  Margins -- Attached edges (approximated)  Drainage Amount -- None  Treatment -- Off loading     No Linked orders to display   Urinary incontinence -Continue regular pericare  Other problems: Moderate to severe protein calorie malnutrition -Continuefeeding supplements Estimated body mass index is 21.88 kg/m as calculated from the following:   Height as of this encounter: 5\' 6"  (1.676 m).   Weight as of this encounter: 61.5 kg.  Transaminitis -Resolved   Data Reviewed: Basic Metabolic Panel: No results for input(s): NA, K, CL, CO2, GLUCOSE, BUN, CREATININE, CALCIUM, MG, PHOS in the last 168 hours. Liver Function Tests: No results for input(s): AST, ALT, ALKPHOS, BILITOT, PROT, ALBUMIN in the last 168 hours. No results for input(s): LIPASE, AMYLASE in the last 168 hours. No results for input(s): AMMONIA in the last 168 hours. CBC: No results for input(s): WBC, NEUTROABS, HGB, HCT, MCV, PLT in the last 168 hours. Cardiac Enzymes: No results for input(s): CKTOTAL, CKMB, CKMBINDEX, TROPONINI in the last 168 hours. BNP (last 3 results) No results for input(s): BNP in the last 8760 hours.  ProBNP (last 3 results) No results for input(s): PROBNP in the last 8760 hours.  CBG: No results for input(s): GLUCAP in the last 168 hours.  No results found for this or any previous visit (from the past 240 hour(s)).   Studies: No results found.  Scheduled Meds: . carbamazepine  400 mg Oral BID   And  . carbamazepine  400  mg Oral QPM  . citalopram  40 mg Oral Daily  . enoxaparin (LOVENOX) injection  40 mg Subcutaneous Q24H  . feeding supplement  237 mL Oral BID BM  . gabapentin  400 mg Oral Daily  . LORazepam  0.5 mg Oral BID  . QUEtiapine  100 mg Oral BID  . QUEtiapine  150 mg Oral QHS   Continuous Infusions:  Principal Problem:   Dementia with behavioral disturbance (HCC) Active Problems:   Major depressive disorder, recurrent episode (HCC)   History of asthma, mild, intermittent   Early Onset Dementia with behavioral problem (HCC)   Protein-calorie malnutrition, moderate (HCC)   Transaminitis   Pressure injury of skin   Alzheimer disease (HCC)   Pressure ulcer of right hip, stage 2 (  [facility])   Consultants:  Psychiatry  Procedures:  None  Antibiotics: Anti-infectives (From admission, onward)   None       Time spent: 20 minutes    Junious Silk ANP  [facility] 7 am - 330 pm/M-F for direct patient care and secure chat Please refer to Amion for contact info 126 days

## 2020-06-03 NOTE — Plan of Care (Signed)
°  Problem: Coping: °Goal: Level of anxiety will decrease °Outcome: Progressing °  °

## 2020-06-04 DIAGNOSIS — F0391 Unspecified dementia with behavioral disturbance: Secondary | ICD-10-CM | POA: Diagnosis not present

## 2020-06-04 NOTE — Plan of Care (Signed)
  Problem: Safety: Goal: Ability to remain free from injury will improve Outcome: Progressing   

## 2020-06-04 NOTE — Progress Notes (Signed)
[facility] PROGRESS NOTE  Sherry Hicks:[id number] DOB: 02-Oct-1960 DOA: 01/27/2020 PCP: [name], DO       03/29/2020                        04/12/2020   Status: The patient will require care spanning > 2 midnights and should be moved to inpatient because: Altered mental status and Unsafe d/c plan -difficult to place secondary to need for skilled nursing facility secondary to severe dementia.   Dispo:               The patient is from: Home              Anticipated d/c is to: SNF              Anticipated d/c date is: > 3 days              Barriers to discharge: Still awaiting SNF bed offer and patient remains medically stable for discharge   Difficult to place patient Yes  Level of care: Med-Surg  Code Status: Full Family Communication: 4/1 Spoke w/ dtr [name] DVT prophylaxis: Ambulatory and low risk for DVT Vaccination status: Not vaccinated.  Foley catheter: No  HPI: 60 y.o.Fwith Alzheimer's dementia with behavioral disturbance who presented with progressively erratic behavior over months.  See longer summary from H&P on 2/9, but in brief, patient had been living in [place] for years until family were informed by neighbors that patient was wandering, appeared unbathed, and she moved to [facility]. Here, she lived with daughter for about 2 years, but in the last few months, she started to behave more impulsively, to at times seem aggressive and have verbal outbursts, severe insomnia, and so APS recommended the patient be brought to the ER.  In the ER, the patient was seen by Psychiatry and started on new Seroquel. She was held pending geri-psych placement, which was delayed for several months.  Daytime behaviors improved on high dose Seroquel but continues with sundowning behaviors. Added Celexa as mood stabilizer along with Tegretol on 2/14.  Behaviors and nocturnal sleep also improved with the addition of scheduled twice daily Ativan.  Of note patient much  more calm after her daughter brought up multiple items from home including a baby doll which the patient "cares for" regularly.   Subjective: Awake, very calm.  Joking with patient and she did smile.  Objective: Vitals with BMI 06/03/2020 06/03/2020 06/03/2020  Height - - -  Weight - - -  BMI - - -  Systolic 129 147 784  Diastolic 72 87 82  Pulse 62 80 83    Intake/Output Summary (Last 24 hours) at 06/04/2020 0747 Last data filed at 06/03/2020 2200 Gross per 24 hour  Intake 1188 ml  Output --  Net 1188 ml   Filed Weights   05/05/20 2100 05/12/20 2043 05/22/20 0302  Weight: 59 kg 59 kg 61.5 kg    Exam:  Constitutional:, Calm and in no acute distress.  Appears to be comfortable Respiratory: Lungs are clear.  She remained stable on room air without any increased work of breathing Cardiovascular: Heart sounds S1-S2, regular pulse, no peripheral edema. Abdomen:  LBM 4/17, variable oral intake.  Normoactive bowel sounds. Neurologic: CN 2-12 grossly intact on visual inspection. nonfocal Psychiatric:.  Oriented times name,currently calm without any verbal outbursts.     Assessment/Plan: Acute problems: Dementia with behavioral disturbance Continue Seroquel-QTC 2/14 <500; 3/3 Qtc 420 ms Continue  Tegretol 400 mg 8 am and 2 pm and 400 mg 6 pm.  Patient is on max dose of Tegretol  Continue Neurontin 400 mg daily at 3 PM to help address sundowning symptoms. Continue low-dose Ativan BID and Celexa 40 mg daily  2/14 reevaluated by PT/OT and given her status of custodial care not appropriate for acute PT/OT  Pressure injury Right anterior hip/Stage 2/resolved -Continue air mattress -According to nursing documentation wound has healed Pressure Injury 05/17/20 Left Stage 2 -  Partial thickness loss of dermis presenting as a shallow open injury with a red, pink wound bed without slough. (Active)  Date First Assessed/Time First Assessed: 05/17/20 1236   Location Orientation: Left   Staging: Stage 2 -  Partial thickness loss of dermis presenting as a shallow open injury with a red, pink wound bed without slough.    Assessments 05/17/2020 12:36 PM 06/02/2020  8:00 AM  Dressing Type Foam - Lift dressing to assess site every shift None  Dressing -- Changed  Dressing Change Frequency -- PRN  State of Healing -- Scar  Site / Wound Assessment -- Clean;Dry  Peri-wound Assessment -- Intact  Margins -- Attached edges (approximated)  Drainage Amount -- None  Treatment -- Off loading     No Linked orders to display   Urinary incontinence -Continue regular pericare  Other problems: Moderate to severe protein calorie malnutrition -Continuefeeding supplements Estimated body mass index is 21.88 kg/m as calculated from the following:   Height as of this encounter: 5\' 6"  (1.676 m).   Weight as of this encounter: 61.5 kg.  Transaminitis -Resolved   Data Reviewed: Basic Metabolic Panel: No results for input(s): NA, K, CL, CO2, GLUCOSE, BUN, CREATININE, CALCIUM, MG, PHOS in the last 168 hours. Liver Function Tests: No results for input(s): AST, ALT, ALKPHOS, BILITOT, PROT, ALBUMIN in the last 168 hours. No results for input(s): LIPASE, AMYLASE in the last 168 hours. No results for input(s): AMMONIA in the last 168 hours. CBC: No results for input(s): WBC, NEUTROABS, HGB, HCT, MCV, PLT in the last 168 hours. Cardiac Enzymes: No results for input(s): CKTOTAL, CKMB, CKMBINDEX, TROPONINI in the last 168 hours. BNP (last 3 results) No results for input(s): BNP in the last 8760 hours.  ProBNP (last 3 results) No results for input(s): PROBNP in the last 8760 hours.  CBG: No results for input(s): GLUCAP in the last 168 hours.  No results found for this or any previous visit (from the past 240 hour(s)).   Studies: No results found.  Scheduled Meds: . carbamazepine  400 mg Oral BID   And  . carbamazepine  400 mg Oral QPM  . citalopram  40 mg Oral Daily  . enoxaparin  (LOVENOX) injection  40 mg Subcutaneous Q24H  . feeding supplement  237 mL Oral BID BM  . gabapentin  400 mg Oral Daily  . LORazepam  0.5 mg Oral BID  . QUEtiapine  100 mg Oral BID  . QUEtiapine  150 mg Oral QHS   Continuous Infusions:  Principal Problem:   Dementia with behavioral disturbance (HCC) Active Problems:   Major depressive disorder, recurrent episode (HCC)   History of asthma, mild, intermittent   Early Onset Dementia with behavioral problem (HCC)   Protein-calorie malnutrition, moderate (HCC)   Transaminitis   Pressure injury of skin   Alzheimer disease (HCC)   Pressure ulcer of right hip, stage 2 (HCC)   Consultants:  Psychiatry  Procedures:  None  Antibiotics: Anti-infectives (From admission, onward)  None       Time spent: 20 minutes    Junious Silk ANP  [facility] 7 am - 330 pm/M-F for direct patient care and secure chat Please refer to Amion for contact info 126 days

## 2020-06-04 NOTE — Progress Notes (Addendum)
11am: CSW spoke with Jacquelin Hawking CAP case manager to provide her with an update regarding placement.   8:20am: CSW spoke with Antionette at [facility], she states administration is still reviewing LOG at this time.  Edwin Dada, MSW, LCSW Transitions of Care  Clinical Social Worker II [phone]

## 2020-06-05 DIAGNOSIS — F0151 Vascular dementia with behavioral disturbance: Secondary | ICD-10-CM | POA: Diagnosis not present

## 2020-06-05 LAB — CBC
HCT: 34.7 % — ABNORMAL LOW (ref 36.0–46.0)
Hemoglobin: 11.5 g/dL — ABNORMAL LOW (ref 12.0–15.0)
MCH: 32 pg (ref 26.0–34.0)
MCHC: 33.1 g/dL (ref 30.0–36.0)
MCV: 96.7 fL (ref 80.0–100.0)
Platelets: 266 10*3/uL (ref 150–400)
RBC: 3.59 MIL/uL — ABNORMAL LOW (ref 3.87–5.11)
RDW: 13.7 % (ref 11.5–15.5)
WBC: 3.2 10*3/uL — ABNORMAL LOW (ref 4.0–10.5)
nRBC: 0.6 % — ABNORMAL HIGH (ref 0.0–0.2)

## 2020-06-05 LAB — COMPREHENSIVE METABOLIC PANEL
ALT: 13 U/L (ref 0–44)
AST: 13 U/L — ABNORMAL LOW (ref 15–41)
Albumin: 3.1 g/dL — ABNORMAL LOW (ref 3.5–5.0)
Alkaline Phosphatase: 54 U/L (ref 38–126)
Anion gap: 5 (ref 5–15)
BUN: 19 mg/dL (ref 6–20)
CO2: 27 mmol/L (ref 22–32)
Calcium: 9.2 mg/dL (ref 8.9–10.3)
Chloride: 104 mmol/L (ref 98–111)
Creatinine, Ser: 0.65 mg/dL (ref 0.44–1.00)
GFR, Estimated: >60 mL/min (ref >60)
Glucose, Bld: 82 mg/dL (ref 70–99)
Potassium: 3.5 mmol/L (ref 3.5–5.1)
Sodium: 136 mmol/L (ref 135–145)
Total Bilirubin: 0.3 mg/dL (ref 0.3–1.2)
Total Protein: 5.3 g/dL — ABNORMAL LOW (ref 6.5–8.1)

## 2020-06-05 NOTE — Progress Notes (Signed)
PROGRESS NOTE    Sherry Hicks  JSH:[id number]  DOB: Jan 25, 1961  DOA: 01/27/2020 PCP: Sherry Jumbo, DO Outpatient Specialists:   Hospital course:  Copied from NP Ellis's note given prolonged hospital stay: 60 y.o.Fwith Alzheimer's dementia with behavioral disturbance who presented with progressively erratic behavior over months.  See longer summary from H&P on 2/9, but in brief, patient had been living in [place] for years until family were informed by neighbors that patient was wandering, appeared unbathed, and she moved to [facility]. Here, she lived with daughter for about 2 years, but in the last few months, she started to behave more impulsively, to at times seem aggressive and have verbal outbursts, severe insomnia, and so APS recommended the patient be brought to the ER.  In the ER, the patient was seen by Psychiatry and started on new Seroquel. She was held pending geri-psych placement, which was delayed for several months.  Daytime behaviors improved on high dose Seroquel but continues with sundowning behaviors. Added Celexa as mood stabilizer along with Tegretol on 2/14.  Behaviors and nocturnal sleep also improved with the addition of scheduled twice daily Ativan.  Of note patient much more calm after her daughter brought up multiple items from home including a baby doll which the patient "cares for" regularly.   Subjective:  Herself was talking to herself holding her baby doll close to her chest.  She was talking fluently and with great gusto however was not making much sense.  I was able to redirect her and she did answer some questions appropriately but then she went back to talking to herself happily.   Objective: Vitals:   06/03/20 2038 06/04/20 0928 06/05/20 0900 06/05/20 1300  BP: 129/72 (!) 142/67 139/70 139/70  Pulse: 62 70 72 79  Resp: 16 18    Temp: 97.7 F (36.5 C) (!) 97.3 F (36.3 C)  98.4 F (36.9 C)  TempSrc: Oral Axillary  Oral  SpO2:  98% 98% 97% 97%  Weight:      Height:        Intake/Output Summary (Last 24 hours) at 06/05/2020 1700 Last data filed at 06/05/2020 1020 Gross per 24 hour  Intake 240 ml  Output --  Net 240 ml   Filed Weights   05/05/20 2100 05/12/20 2043 05/22/20 0302  Weight: 59 kg 59 kg 61.5 kg     Exam:  General: Thin female in good spirits talking to herself in no acute distress Eyes: sclera anicteric, conjuctiva mild injection bilaterally CVS: S1-S2, regular  Respiratory:  decreased air entry bilaterally secondary to decreased inspiratory effort, rales at bases  GI: NABS, soft, NT  LE: No edema.  Neuro:  Moving all extremities equally with normal strength,  Psych: She has advanced dementia  Assessment & Plan:   60 year old female with dementia is awaiting placement after prolonged hospital stay  Dementia Continue Seroquel, Tegretol Neurontin Ativan and Celexa per present regimen Follow QTC closely  Stage II decubitus Apparently resolved, continue air mattress     DVT prophylaxis: Lovenox Code Status: Full Family Communication: None Disposition Plan:   Anticipated Discharge Location: TBD  Barriers to Discharge: Awaiting bed  Is patient medically stable for Discharge: Yes   Consultants:  Psychiatry  Procedures:  None  Antimicrobials:  None at present   Data Reviewed:  Basic Metabolic Panel: Recent Labs  Lab 06/05/20 0156  NA 136  K 3.5  CL 104  CO2 27  GLUCOSE 82  BUN 19  CREATININE 0.65  CALCIUM 9.2   Liver Function Tests: Recent Labs  Lab 06/05/20 0156  AST 13*  ALT 13  ALKPHOS 54  BILITOT 0.3  PROT 5.3*  ALBUMIN 3.1*   No results for input(s): LIPASE, AMYLASE in the last 168 hours. No results for input(s): AMMONIA in the last 168 hours. CBC: Recent Labs  Lab 06/05/20 0156  WBC 3.2*  HGB 11.5*  HCT 34.7*  MCV 96.7  PLT 266   Cardiac Enzymes: No results for input(s): CKTOTAL, CKMB, CKMBINDEX, TROPONINI in the last 168  hours. BNP (last 3 results) No results for input(s): PROBNP in the last 8760 hours. CBG: No results for input(s): GLUCAP in the last 168 hours.  No results found for this or any previous visit (from the past 240 hour(s)).    Studies: No results found.   Scheduled Meds: . carbamazepine  400 mg Oral BID   And  . carbamazepine  400 mg Oral QPM  . citalopram  40 mg Oral Daily  . enoxaparin (LOVENOX) injection  40 mg Subcutaneous Q24H  . feeding supplement  237 mL Oral BID BM  . gabapentin  400 mg Oral Daily  . LORazepam  0.5 mg Oral BID  . QUEtiapine  100 mg Oral BID  . QUEtiapine  150 mg Oral QHS   Continuous Infusions:  Principal Problem:   Dementia with behavioral disturbance (HCC) Active Problems:   Major depressive disorder, recurrent episode (HCC)   History of asthma, mild, intermittent   Early Onset Dementia with behavioral problem (HCC)   Protein-calorie malnutrition, moderate (HCC)   Transaminitis   Pressure injury of skin   Alzheimer disease (HCC)   Pressure ulcer of right hip, stage 2 (HCC)     Sherry Hicks, [facility]  If 7PM-7AM, please contact night-coverage [email or web address]   LOS: 62 days

## 2020-06-06 DIAGNOSIS — F0151 Vascular dementia with behavioral disturbance: Secondary | ICD-10-CM | POA: Diagnosis not present

## 2020-06-06 NOTE — Progress Notes (Signed)
PROGRESS NOTE    Sherry Hicks  UXN:[id number]  DOB: 11-Sep-1960  DOA: 01/27/2020 PCP: [name], DO Outpatient Specialists:   Hospital course:  Copied from NP Ellis's note given prolonged hospital stay: 60 y.o.Fwith Alzheimer's dementia with behavioral disturbance who presented with progressively erratic behavior over months.  See longer summary from H&P on 2/9, but in brief, patient had been living in [place] for years until family were informed by neighbors that patient was wandering, appeared unbathed, and she moved to [facility]. Here, she lived with daughter for about 2 years, but in the last few months, she started to behave more impulsively, to at times seem aggressive and have verbal outbursts, severe insomnia, and so APS recommended the patient be brought to the ER.  In the ER, the patient was seen by Psychiatry and started on new Seroquel. She was held pending geri-psych placement, which was delayed for several months.  Daytime behaviors improved on high dose Seroquel but continues with sundowning behaviors. Added Celexa as mood stabilizer along with Tegretol on 2/14.  Behaviors and nocturnal sleep also improved with the addition of scheduled twice daily Ativan.  Of note patient much more calm after her daughter brought up multiple items from home including a baby doll which the patient "cares for" regularly.   Subjective:  Patient sleeping comfortably.  I did not wake her up.  Nurses note she has been at baseline.   Objective: Vitals:   06/05/20 2048 06/06/20 0517 06/06/20 0919 06/06/20 1625  BP: 112/77 (!) 152/95 124/64 133/79  Pulse: 78 (!) 55 71 81  Resp: 18 18 20 18   Temp: 98.4 F (36.9 C) 98.4 F (36.9 C)    TempSrc:      SpO2:      Weight:      Height:        Intake/Output Summary (Last 24 hours) at 06/06/2020 1636 Last data filed at 06/06/2020 1256 Gross per 24 hour  Intake 600 ml  Output 0 ml  Net 600 ml   Filed Weights   05/05/20  2100 05/12/20 2043 05/22/20 0302  Weight: 59 kg 59 kg 61.5 kg     Exam:  General: Thin female lying flat in bed sleeping comfortably. Eyes: sclera anicteric, conjuctiva mild injection bilaterally CVS: S1-S2, regular  Respiratory:  decreased air entry bilaterally secondary to decreased inspiratory effort, rales at bases  GI: NABS, soft, NT  LE: No edema.   Assessment & Plan:   60 year old female with dementia is awaiting placement after prolonged hospital stay  Dementia Continue Seroquel, Tegretol Neurontin Ativan and Celexa per present regimen Follow QTC closely  Stage II decubitus Apparently resolved, continue air mattress     DVT prophylaxis: Lovenox Code Status: Full Family Communication: None Disposition Plan:   Anticipated Discharge Location: TBD  Barriers to Discharge: Awaiting bed  Is patient medically stable for Discharge: Yes   Consultants:  Psychiatry  Procedures:  None  Antimicrobials:  None at present   Data Reviewed:  Basic Metabolic Panel: Recent Labs  Lab 06/05/20 0156  NA 136  K 3.5  CL 104  CO2 27  GLUCOSE 82  BUN 19  CREATININE 0.65  CALCIUM 9.2   Liver Function Tests: Recent Labs  Lab 06/05/20 0156  AST 13*  ALT 13  ALKPHOS 54  BILITOT 0.3  PROT 5.3*  ALBUMIN 3.1*   No results for input(s): LIPASE, AMYLASE in the last 168 hours. No results for input(s): AMMONIA in the last 168 hours. CBC:  Recent Labs  Lab 06/05/20 0156  WBC 3.2*  HGB 11.5*  HCT 34.7*  MCV 96.7  PLT 266   Cardiac Enzymes: No results for input(s): CKTOTAL, CKMB, CKMBINDEX, TROPONINI in the last 168 hours. BNP (last 3 results) No results for input(s): PROBNP in the last 8760 hours. CBG: No results for input(s): GLUCAP in the last 168 hours.  No results found for this or any previous visit (from the past 240 hour(s)).    Studies: No results found.   Scheduled Meds: . carbamazepine  400 mg Oral BID   And  . carbamazepine  400 mg  Oral QPM  . citalopram  40 mg Oral Daily  . enoxaparin (LOVENOX) injection  40 mg Subcutaneous Q24H  . feeding supplement  237 mL Oral BID BM  . gabapentin  400 mg Oral Daily  . LORazepam  0.5 mg Oral BID  . QUEtiapine  100 mg Oral BID  . QUEtiapine  150 mg Oral QHS   Continuous Infusions:  Principal Problem:   Dementia with behavioral disturbance (HCC) Active Problems:   Major depressive disorder, recurrent episode (HCC)   History of asthma, mild, intermittent   Early Onset Dementia with behavioral problem (HCC)   Protein-calorie malnutrition, moderate (HCC)   Transaminitis   Pressure injury of skin   Alzheimer disease (HCC)   Pressure ulcer of right hip, stage 2 (HCC)     Sherry Hicks, [facility]  If 7PM-7AM, please contact night-coverage [email or web address]   LOS: 63 days

## 2020-06-07 DIAGNOSIS — F0391 Unspecified dementia with behavioral disturbance: Secondary | ICD-10-CM | POA: Diagnosis not present

## 2020-06-07 NOTE — Progress Notes (Signed)
[facility] PROGRESS NOTE  Sherry TIPPETTS IDP:[id number] DOB: 04-18-1960 DOA: 01/27/2020 PCP: [name], DO       03/29/2020                        04/12/2020   Status: The patient will require care spanning > 2 midnights and should be moved to inpatient because: Altered mental status and Unsafe d/c plan -difficult to place secondary to need for skilled nursing facility secondary to severe dementia.   Dispo:               The patient is from: Home              Anticipated d/c is to: SNF              Anticipated d/c date is: > 3 days              Barriers to discharge: Still awaiting SNF bed offer and patient remains medically stable for discharge   Difficult to place patient Yes  Level of care: Med-Surg  Code Status: Full Family Communication: 4/1 Spoke w/ dtr [name] DVT prophylaxis: Ambulatory and low risk for DVT Vaccination status: Not vaccinated.  Foley catheter: No  HPI: 60 y.o.Fwith Alzheimer's dementia with behavioral disturbance who presented with progressively erratic behavior over months.  See longer summary from H&P on 2/9, but in brief, patient had been living in [place] for years until family were informed by neighbors that patient was wandering, appeared unbathed, and she moved to [facility]. Here, she lived with daughter for about 2 years, but in the last few months, she started to behave more impulsively, to at times seem aggressive and have verbal outbursts, severe insomnia, and so APS recommended the patient be brought to the ER.  In the ER, the patient was seen by Psychiatry and started on new Seroquel. She was held pending geri-psych placement, which was delayed for several months.  Daytime behaviors improved on high dose Seroquel but continues with sundowning behaviors. Added Celexa as mood stabilizer along with Tegretol on 2/14.  Behaviors and nocturnal sleep also improved with the addition of scheduled twice daily Ativan.  Of note patient much  more calm after her daughter brought up multiple items from home including a baby doll which the patient "cares for" regularly.   Subjective: And calm.  Does not appear to be hallucinating or arguing with people who were not in the room.  Objective: Vitals with BMI 06/07/2020 06/06/2020 06/06/2020  Height - - -  Weight - - -  BMI - - -  Systolic 132 107 98  Diastolic 82 56 63  Pulse 53 67 69    Intake/Output Summary (Last 24 hours) at 06/07/2020 0752 Last data filed at 06/07/2020 0200 Gross per 24 hour  Intake 960 ml  Output 0 ml  Net 960 ml   Filed Weights   05/05/20 2100 05/12/20 2043 05/22/20 0302  Weight: 59 kg 59 kg 61.5 kg    Exam:  Constitutional: Awake, calm, no acute distress and appears to be comfortable Respiratory: Lung sounds are clear, room air Cardiovascular: S1-S2, no peripheral edema, regular pulse. Abdomen:  LBM 4/19,  Neurologic: CN 2-12 grossly intact on visual inspection. nonfocal Psychiatric: Awake and currently present.  Oriented times name only.  Significant short-term memory deficits secondary to underlying dementia     Assessment/Plan: Acute problems: Dementia with behavioral disturbance Continue Seroquel-QTC 2/14 <500; 3/3 Qtc 420 ms Continue Tegretol  400 mg 8 am and 2 pm and 400 mg 6 pm.  Patient is on max dose of Tegretol  Continue Neurontin 400 mg daily at 3 PM to help address sundowning symptoms. Continue low-dose Ativan BID and Celexa 40 mg daily  2/14 reevaluated by PT/OT and given her status of custodial care not appropriate for acute PT/OT  Pressure injury Right anterior hip/Stage 2/resolved -Continue air mattress -According to nursing documentation wound has healed Pressure Injury 05/17/20 Left Stage 2 -  Partial thickness loss of dermis presenting as a shallow open injury with a red, pink wound bed without slough. (Active)  Date First Assessed/Time First Assessed: 05/17/20 1236   Location Orientation: Left  Staging: Stage 2 -   Partial thickness loss of dermis presenting as a shallow open injury with a red, pink wound bed without slough.    Assessments 05/17/2020 12:36 PM 06/06/2020  8:00 PM  Dressing Type Foam - Lift dressing to assess site every shift Foam - Lift dressing to assess site every shift  Dressing -- Clean;Dry;Intact     No Linked orders to display   Urinary incontinence -Continue regular pericare  Other problems: Moderate to severe protein calorie malnutrition -Continuefeeding supplements Estimated body mass index is 21.88 kg/m as calculated from the following:   Height as of this encounter: 5\' 6"  (1.676 m).   Weight as of this encounter: 61.5 kg.  Transaminitis -Resolved   Data Reviewed: Basic Metabolic Panel: Recent Labs  Lab 06/05/20 0156  NA 136  K 3.5  CL 104  CO2 27  GLUCOSE 82  BUN 19  CREATININE 0.65  CALCIUM 9.2   Liver Function Tests: Recent Labs  Lab 06/05/20 0156  AST 13*  ALT 13  ALKPHOS 54  BILITOT 0.3  PROT 5.3*  ALBUMIN 3.1*   No results for input(s): LIPASE, AMYLASE in the last 168 hours. No results for input(s): AMMONIA in the last 168 hours. CBC: Recent Labs  Lab 06/05/20 0156  WBC 3.2*  HGB 11.5*  HCT 34.7*  MCV 96.7  PLT 266   Cardiac Enzymes: No results for input(s): CKTOTAL, CKMB, CKMBINDEX, TROPONINI in the last 168 hours. BNP (last 3 results) No results for input(s): BNP in the last 8760 hours.  ProBNP (last 3 results) No results for input(s): PROBNP in the last 8760 hours.  CBG: No results for input(s): GLUCAP in the last 168 hours.  No results found for this or any previous visit (from the past 240 hour(s)).   Studies: No results found.  Scheduled Meds: . carbamazepine  400 mg Oral BID   And  . carbamazepine  400 mg Oral QPM  . citalopram  40 mg Oral Daily  . enoxaparin (LOVENOX) injection  40 mg Subcutaneous Q24H  . feeding supplement  237 mL Oral BID BM  . gabapentin  400 mg Oral Daily  . LORazepam  0.5 mg Oral BID   . QUEtiapine  100 mg Oral BID  . QUEtiapine  150 mg Oral QHS   Continuous Infusions:  Principal Problem:   Dementia with behavioral disturbance (HCC) Active Problems:   Major depressive disorder, recurrent episode (HCC)   History of asthma, mild, intermittent   Early Onset Dementia with behavioral problem (HCC)   Protein-calorie malnutrition, moderate (HCC)   Transaminitis   Pressure injury of skin   Alzheimer disease (HCC)   Pressure ulcer of right hip, stage 2 (HCC)   Consultants:  Psychiatry  Procedures:  None  Antibiotics: Anti-infectives (From admission, onward)  None       Time spent: 20 minutes    Junious Silk ANP  [facility] 7 am - 330 pm/M-F for direct patient care and secure chat Please refer to Amion for contact info 126 days

## 2020-06-07 NOTE — Progress Notes (Addendum)
1:30pm: CSW and RN CM spoke with patient's daughter [name] and brother Peyton Najjar at bedside to provide them with updated discharge information. [name] agreeable to discharge to [place] once the facility is ready to receive her.  TOC staff updated Eugenie Norrie, unit AD of information.  1pm: CSW spoke with Antionette at [facility] who states the facility is will  10:45am: CSW spoke with Antionette at [facility] who states the LOG request is still under review by corporate at this time.  CSW attempted to reach Cindee Salt at [facility] without success - a voicemail was left requesting a return call.  Edwin Dada, MSW, LCSW Transitions of Care  Clinical Social Worker II [phone]

## 2020-06-08 DIAGNOSIS — F0391 Unspecified dementia with behavioral disturbance: Secondary | ICD-10-CM | POA: Diagnosis not present

## 2020-06-08 NOTE — Progress Notes (Signed)
[facility] PROGRESS NOTE  Sherry Hicks JHE:[id number] DOB: 08/25/1960 DOA: 01/27/2020 PCP: [name], DO       03/29/2020                        04/12/2020   Status: The patient will require care spanning > 2 midnights and should be moved to inpatient because: Altered mental status and Unsafe d/c plan -difficult to place secondary to need for skilled nursing facility secondary to severe dementia.   Dispo:               The patient is from: Home              Anticipated d/c is to: SNF              Anticipated d/c date is: > 3 days              Barriers to discharge: Has bed offer at [facility], SNF but waiting for formal allergy approval while we transition from community to long-term Medicaid-later clarified that unable to transition until patient has actually been admitted to an SNF   Difficult to place patient Yes  Level of care: Med-Surg  Code Status: Full Family Communication: 4/1 Spoke w/ dtr Sherry Hicks DVT prophylaxis: Ambulatory and low risk for DVT Vaccination status: Not vaccinated.  Foley catheter: No  HPI: 60 y.o.Fwith Alzheimer's dementia with behavioral disturbance who presented with progressively erratic behavior over months.  See longer summary from H&P on 2/9, but in brief, patient had been living in [place] for years until family were informed by neighbors that patient was wandering, appeared unbathed, and she moved to [facility]. Here, she lived with daughter for about 2 years, but in the last few months, she started to behave more impulsively, to at times seem aggressive and have verbal outbursts, severe insomnia, and so APS recommended the patient be brought to the ER.  In the ER, the patient was seen by Psychiatry and started on new Seroquel. She was held pending geri-psych placement, which was delayed for several months.  Daytime behaviors improved on high dose Seroquel but continues with sundowning behaviors. Added Celexa as mood stabilizer  along with Tegretol on 2/14.  Behaviors and nocturnal sleep also improved with the addition of scheduled twice daily Ativan.  Of note patient much more calm after her daughter brought up multiple items from home including a baby doll which the patient "cares for" regularly.   Subjective: Sleeping soundly and did not awaken during my physical examination.  Objective: Vitals with BMI 06/07/2020 06/07/2020 06/06/2020  Height - - -  Weight - - -  BMI - - -  Systolic 149 132 185  Diastolic 80 82 56  Pulse 67 53 67    Intake/Output Summary (Last 24 hours) at 06/08/2020 0753 Last data filed at 06/07/2020 2000 Gross per 24 hour  Intake 840 ml  Output --  Net 840 ml   Filed Weights   05/05/20 2100 05/12/20 2043 05/22/20 0302  Weight: 59 kg 59 kg 61.5 kg    Exam:  Constitutional: Sleeping calmly.  Appears to be comfortable Respiratory: Lung sounds are clear to auscultation anteriorly.  Room air Cardiovascular: S1-S2.  No peripheral edema.  Regular pulse Abdomen:  LBM 4/24, tender with normoactive bowel sounds. Neurologic: CN 2-12 grossly intact on visual inspection. nonfocal Psychiatric: Sleeping but at baseline oriented times name only.    Assessment/Plan: Acute problems: Dementia with behavioral disturbance Continue  Seroquel-QTC 2/14 <500; 3/3 Qtc 420 ms Continue Tegretol 400 mg 8 am and 2 pm and 400 mg 6 pm.  Patient is on max dose of Tegretol  Continue Neurontin 400 mg daily at 3 PM to help address sundowning symptoms. Continue low-dose Ativan BID and Celexa 40 mg daily  2/14 reevaluated by PT/OT and given her status of custodial care not appropriate for acute PT/OT  Pressure injury Right anterior hip/Stage 2/resolved -Continue air mattress -According to nursing documentation wound has healed Pressure Injury 05/17/20 Left Stage 2 -  Partial thickness loss of dermis presenting as a shallow open injury with a red, pink wound bed without slough. (Active)  Date First  Assessed/Time First Assessed: 05/17/20 1236   Location Orientation: Left  Staging: Stage 2 -  Partial thickness loss of dermis presenting as a shallow open injury with a red, pink wound bed without slough.    Assessments 05/17/2020 12:36 PM 06/07/2020  8:00 PM  Dressing Type Foam - Lift dressing to assess site every shift Foam - Lift dressing to assess site every shift     No Linked orders to display   Urinary incontinence -Continue regular pericare  Other problems: Moderate to severe protein calorie malnutrition -Continuefeeding supplements Estimated body mass index is 21.88 kg/m as calculated from the following:   Height as of this encounter: 5\' 6"  (1.676 m).   Weight as of this encounter: 61.5 kg.  Transaminitis -Resolved   Data Reviewed: Basic Metabolic Panel: Recent Labs  Lab 06/05/20 0156  NA 136  K 3.5  CL 104  CO2 27  GLUCOSE 82  BUN 19  CREATININE 0.65  CALCIUM 9.2   Liver Function Tests: Recent Labs  Lab 06/05/20 0156  AST 13*  ALT 13  ALKPHOS 54  BILITOT 0.3  PROT 5.3*  ALBUMIN 3.1*   No results for input(s): LIPASE, AMYLASE in the last 168 hours. No results for input(s): AMMONIA in the last 168 hours. CBC: Recent Labs  Lab 06/05/20 0156  WBC 3.2*  HGB 11.5*  HCT 34.7*  MCV 96.7  PLT 266   Cardiac Enzymes: No results for input(s): CKTOTAL, CKMB, CKMBINDEX, TROPONINI in the last 168 hours. BNP (last 3 results) No results for input(s): BNP in the last 8760 hours.  ProBNP (last 3 results) No results for input(s): PROBNP in the last 8760 hours.  CBG: No results for input(s): GLUCAP in the last 168 hours.  No results found for this or any previous visit (from the past 240 hour(s)).   Studies: No results found.  Scheduled Meds: . carbamazepine  400 mg Oral BID   And  . carbamazepine  400 mg Oral QPM  . citalopram  40 mg Oral Daily  . enoxaparin (LOVENOX) injection  40 mg Subcutaneous Q24H  . feeding supplement  237 mL Oral BID BM  .  gabapentin  400 mg Oral Daily  . LORazepam  0.5 mg Oral BID  . QUEtiapine  100 mg Oral BID  . QUEtiapine  150 mg Oral QHS   Continuous Infusions:  Principal Problem:   Dementia with behavioral disturbance (HCC) Active Problems:   Major depressive disorder, recurrent episode (HCC)   History of asthma, mild, intermittent   Early Onset Dementia with behavioral problem (HCC)   Protein-calorie malnutrition, moderate (HCC)   Transaminitis   Pressure injury of skin   Alzheimer disease (HCC)   Pressure ulcer of right hip, stage 2 (HCC)   Consultants:  Psychiatry  Procedures:  None  Antibiotics:  Anti-infectives (From admission, onward)   None       Time spent: 20 minutes    Junious Silk ANP  [facility] 7 am - 330 pm/M-F for direct patient care and secure chat Please refer to Amion for contact info 126 days

## 2020-06-08 NOTE — Progress Notes (Addendum)
10:20am: CSW spoke with [place] at [facility] who states the authorization was denied.  CSW attempted to reach Antionette at [facility] without success, a voicemail was left requesting a return call.  7:45am: CSW uploaded clinicals to [facility] portal in attempts to obtain insurance authorization for skilled services.  CSW sent LOG contract to Antionette at [facility].  Edwin Dada, MSW, LCSW Transitions of Care  Clinical Social Worker II [phone]

## 2020-06-08 NOTE — Plan of Care (Signed)
  Problem: Safety: Goal: Ability to remain free from injury will improve Outcome: Progressing   Problem: Skin Integrity: Goal: Risk for impaired skin integrity will decrease Outcome: Progressing   

## 2020-06-09 DIAGNOSIS — F0281 Dementia in other diseases classified elsewhere with behavioral disturbance: Secondary | ICD-10-CM

## 2020-06-09 DIAGNOSIS — G309 Alzheimer's disease, unspecified: Secondary | ICD-10-CM

## 2020-06-09 NOTE — Plan of Care (Signed)
  Problem: Safety: Goal: Ability to remain free from injury will improve Outcome: Progressing   Problem: Skin Integrity: Goal: Risk for impaired skin integrity will decrease Outcome: Progressing   

## 2020-06-09 NOTE — Evaluation (Signed)
Occupational Therapy Evaluation/Discharge Patient Details Name: Sherry Hicks MRN: [id number] DOB: 1960-12-07 Today's Date: 06/09/2020    History of Present Illness Sherry Hicks is a 60 y.o. F with hx dementia, suspected psychiatric disorder NOS, and suspected substance use history who presented with progressively erratic behavior over months. APS recommended pt be brought in to ER and admitted, where she has been since 01/25/20   Clinical Impression   PTA, pt lived with daughter and was ambulatory per chart. Pt presents now requiring Total A for all ADLs and bed mobility. Pt with inconsistent following of commands but intermittent appropriate responses noted during session (see cognition section). Assessed B UE ROM with some difficulty due to pt resistance felt, but suspect elbow/shoulder contractures present. Pt repositioned in bed for increased comfort and skin integrity. Recommend SNF at DC due to pt below previous functional baseline. Will sign off at acute level and defer therapy needs to next venue of care.    Follow Up Recommendations  SNF;Supervision/Assistance - 24 hour    Equipment Recommendations  Wheelchair (measurements OT);Wheelchair cushion (measurements OT);Hospital bed    Recommendations for Other Services       Precautions / Restrictions Precautions Precautions: Fall;Other (comment) Precaution Comments: dementia Restrictions Weight Bearing Restrictions: No      Mobility Bed Mobility Overal bed mobility: Needs Assistance             General bed mobility comments: Total A x 2 for repositioning efforts bed level    Transfers                 General transfer comment: will need maximove at this time    Balance                                           ADL either performed or assessed with clinical judgement   ADL Overall ADL's : Needs assistance/impaired Eating/Feeding: Total assistance;Bed level   Grooming: Total  assistance   Upper Body Bathing: Total assistance   Lower Body Bathing: Total assistance   Upper Body Dressing : Total assistance   Lower Body Dressing: Total assistance       Toileting- Clothing Manipulation and Hygiene: Total assistance         General ADL Comments: Pt currently Total A for all ADLs due to cognition     Vision Patient Visual Report: No change from baseline Vision Assessment?: No apparent visual deficits     Perception     Praxis      Pertinent Vitals/Pain Pain Assessment: Faces Faces Pain Scale: Hurts little more Pain Location: generalized with PROM Pain Descriptors / Indicators: Grimacing;Guarding Pain Intervention(s): Monitored during session;Repositioned;Limited activity within patient's tolerance     Hand Dominance Right   Extremity/Trunk Assessment Upper Extremity Assessment Upper Extremity Assessment: RUE deficits/detail;LUE deficits/detail;Difficult to assess due to impaired cognition RUE Deficits / Details: Difficult to assess due to cognition and noted resistance with PROM efforts. hand/wrist PROM WFL. Possible elbow flexion contractures and difficulty with shoulder movement due to resistance. RUE Coordination: decreased fine motor;decreased gross motor LUE Deficits / Details: Difficult to assess due to cognition and noted resistance with PROM efforts. hand/wrist PROM WFL. Possible elbow flexion contractures and difficulty with shoulder movement due to resistance. LUE Coordination: decreased fine motor;decreased gross motor   Lower Extremity Assessment Lower Extremity Assessment: Defer to PT evaluation   Cervical / Trunk  Assessment Cervical / Trunk Assessment: Kyphotic   Communication Communication Communication: Expressive difficulties;Other (comment) (tangential speech and varying appropriateness of answers)   Cognition Arousal/Alertness: Awake/alert Behavior During Therapy: Flat affect Overall Cognitive Status: History of  cognitive impairments - at baseline Area of Impairment: Orientation;Attention;Memory;Following commands;Safety/judgement;Awareness;Problem solving                 Orientation Level: Disoriented to;Place;Time;Situation Current Attention Level: Focused Memory: Decreased short-term memory Following Commands: Follows one step commands inconsistently Safety/Judgement: Decreased awareness of safety;Decreased awareness of deficits Awareness: Intellectual Problem Solving: Slow processing;Decreased initiation;Difficulty sequencing;Requires verbal cues;Requires tactile cues General Comments: H/o Alzheimer's dementia. Pt with inconsistent following of commands, but noted with some appropriate responses (says "no" when asked for repositioning assist). Pt unable to tell therapists her last name, but when asked if last name is "Kysar", pt nods her head yes. Unable to verbalize where she is, even when given choices. During session, pt asked "what's that?" referring to TV noise demo some awareness of surroundings   General Comments       Exercises     Shoulder Instructions      Home Living Family/patient expects to be discharged to:: Skilled nursing facility                                 Additional Comments: From home with daughter but plans to DC to SNF      Prior Functioning/Environment Level of Independence: Needs assistance        Comments: Pt unable to answer PLOF questions but per chart, was ambulatory prior to admission with increasing confusion and erratic behavior. Here at hospital, pt requires Total A for ADLs and unable to ambulate        OT Problem List: Decreased activity tolerance;Decreased cognition;Decreased safety awareness;Decreased coordination;Decreased range of motion;Decreased strength      OT Treatment/Interventions:      OT Goals(Current goals can be found in the care plan section) Acute Rehab OT Goals Patient Stated Goal: none stated OT Goal  Formulation: Patient unable to participate in goal setting  OT Frequency:     Barriers to D/C:            Co-evaluation              AM-PAC OT "6 Clicks" Daily Activity     Outcome Measure Help from another person eating meals?: Total Help from another person taking care of personal grooming?: Total Help from another person toileting, which includes using toliet, bedpan, or urinal?: Total Help from another person bathing (including washing, rinsing, drying)?: Total Help from another person to put on and taking off regular upper body clothing?: Total Help from another person to put on and taking off regular lower body clothing?: Total 6 Click Score: 6   End of Session Nurse Communication: Mobility status  Activity Tolerance: Other (comment) (limited by cognition) Patient left: in bed;with call bell/phone within reach  OT Visit Diagnosis: Other symptoms and signs involving cognitive function;Muscle weakness (generalized) (M62.81)                Time: 1018-1030 OT Time Calculation (min): 12 min Charges:  OT General Charges $OT Visit: 1 Visit OT Evaluation $OT Eval Moderate Complexity: 1 Mod  Bradd Canary, OTR/L Acute Rehab Services Office: [phone]  Lorre Munroe 06/09/2020, 10:47 AM

## 2020-06-09 NOTE — Progress Notes (Signed)
06/09/20 1119  PT Visit Information  Assistance Needed +2  History of Present Illness Sherry Hicks is a 60 y.o. F with hx dementia, suspected psychiatric disorder NOS, and suspected substance use history who presented with progressively erratic behavior over months. APS recommended pt be brought in to ER and admitted, where she has been since 01/25/20  Precautions  Precautions Fall;Other (comment)  Precaution Comments dementia  Restrictions  Weight Bearing Restrictions No  Home Living  Family/patient expects to be discharged to: Skilled nursing facility  Additional Comments From home with daughter but plans to DC to SNF  Prior Function  Level of Independence Needs assistance  Comments Pt unable to answer PLOF questions but per chart, was ambulatory prior to admission with increasing confusion and erratic behavior. Here at hospital, pt requires Total A for ADLs and unable to ambulate  Communication  Communication Expressive difficulties;Other (comment) (tangential speech and varying appropriateness of answers)  Pain Assessment  Pain Assessment Faces  Faces Pain Scale 4  Pain Location generalized with PROM  Pain Descriptors / Indicators Grimacing;Guarding  Pain Intervention(s) Monitored during session;Limited activity within patient's tolerance;Repositioned  Cognition  Arousal/Alertness Awake/alert  Behavior During Therapy Flat affect  Overall Cognitive Status History of cognitive impairments - at baseline  Area of Impairment Orientation;Attention;Memory;Following commands;Safety/judgement;Awareness;Problem solving  Orientation Level Disoriented to;Place;Time;Situation  Current Attention Level Focused  Memory Decreased short-term memory  Following Commands Follows one step commands inconsistently  Safety/Judgement Decreased awareness of safety;Decreased awareness of deficits  Awareness Intellectual  Problem Solving Slow processing;Decreased initiation;Difficulty  sequencing;Requires verbal cues;Requires tactile cues  General Comments H/o Alzheimer's dementia. Pt with inconsistent following of commands, but noted with some appropriate responses (says "no" when asked for repositioning assist). Pt unable to tell therapists her last name, but when asked if last name is "Donaldson", pt nods her head yes. Unable to verbalize where she is, even when given choices. During session, pt asked "what's that?" referring to TV noise demo some awareness of surroundings  Upper Extremity Assessment  Upper Extremity Assessment Defer to OT evaluation  Lower Extremity Assessment  Lower Extremity Assessment RLE deficits/detail;LLE deficits/detail;Difficult to assess due to impaired cognition  RLE Deficits / Details Difficult to assess secondary to resistiveness. Limited ROM at ankles, however, feet in neutral position. Likely hamstring contractures.  LLE Deficits / Details Difficult to assess secondary to resistiveness. Limited ROM at ankles, however, feet in neutral position. Likely hamstring contractures.  Cervical / Trunk Assessment  Cervical / Trunk Assessment Kyphotic  Bed Mobility  Overal bed mobility Needs Assistance  Bed Mobility Rolling  Rolling Total assist;+2 for physical assistance  General bed mobility comments Total A x 2 for repositioning efforts bed level  Transfers  General transfer comment will need maximove at this time  Exercises  Exercises General Lower Extremity  General Exercises - Lower Extremity  Heel Slides PROM;Both;5 reps  PT - End of Session  Activity Tolerance Patient tolerated treatment well  Patient left in bed;with call bell/phone within reach  Nurse Communication Mobility status  PT Assessment  PT Recommendation/Assessment All further PT needs can be met in the next venue of care  PT Visit Diagnosis Difficulty in walking, not elsewhere classified (R26.2);Muscle weakness (generalized) (M62.81)  PT Problem List Decreased range of  motion;Decreased strength;Decreased mobility;Decreased balance;Decreased cognition;Decreased knowledge of use of DME;Decreased knowledge of precautions;Decreased safety awareness  AM-PAC PT "6 Clicks" Mobility Outcome Measure (Version 2)  Help needed turning from your back to your side while in a flat bed  without using bedrails? 1  Help needed moving from lying on your back to sitting on the side of a flat bed without using bedrails? 1  Help needed moving to and from a bed to a chair (including a wheelchair)? 1  Help needed standing up from a chair using your arms (e.g., wheelchair or bedside chair)? 1  Help needed to walk in hospital room? 1  Help needed climbing 3-5 steps with a railing?  1  6 Click Score 6  Consider Recommendation of Discharge To: CIR/SNF/LTACH  PT Recommendation  Follow Up Recommendations SNF;Supervision/Assistance - 24 hour  PT equipment None recommended by PT  Individuals Consulted  Consulted and Agree with Results and Recommendations Patient unable/family or caregiver not available  Acute Rehab PT Goals  Patient Stated Goal none stated  PT Goal Formulation Patient unable to participate in goal setting  Time For Goal Achievement 06/09/20  Potential to Achieve Goals Fair  PT Time Calculation  PT Start Time (ACUTE ONLY) 1018  PT Stop Time (ACUTE ONLY) 1032  PT Time Calculation (min) (ACUTE ONLY) 14 min  PT General Charges  $$ ACUTE PT VISIT 1 Visit  PT Evaluation  $PT Eval Moderate Complexity 1 Mod  Written Expression  Dominant Hand Right    Pt seen and evaluated with OT for safety this session. Slightly resistive during ROM on BLE, however, allowed PT to perform some ROM on BLE. Pt requiring total A +2 for repositioning in bed. Appropriately answering some questions, however, remains confused. Per previous notes, pt was ambulatory at a min A- min guard A level. Given current decline in mobility, feel she would benefit from SNF to increased independence and  safety.Further skilled PT needs to be deferred to SNF. Please re-consult if needs change.    G, PT, DPT  Acute Rehabilitation Services  Pager: [phone] Office: [phone]  

## 2020-06-09 NOTE — Progress Notes (Signed)
[facility] PROGRESS NOTE  Sherry Hicks LOV:[id number] DOB: 1960-05-07 DOA: 01/27/2020 PCP: Sherry Jumbo, DO       03/29/2020                        04/12/2020   Status: The patient will require care spanning > 2 midnights and should be moved to inpatient because: Altered mental status and Unsafe d/c plan -difficult to place secondary to need for skilled nursing facility secondary to severe dementia.   Dispo:               The patient is from: Home              Anticipated d/c is to: SNF              Anticipated d/c date is: > 3 days              Barriers to discharge: Has bed offer at [facility], SNF but waiting for formal allergy approval while we transition from community to long-term Medicaid-later clarified that unable to transition until patient has actually been admitted to an SNF. CSW conversation with insurance carrier they are requesting repeat PT/OT as well as nutrition evaluation to assist with qualifying patient for insurance coverage for initial placement at SNF.   Difficult to place patient Yes  Level of care: Med-Surg  Code Status: Full Family Communication: 4/1 Spoke w/ dtr Sherry Hicks DVT prophylaxis: Ambulatory and low risk for DVT Vaccination status: Not vaccinated.  Foley catheter: No  HPI: 60 y.o.Fwith Alzheimer's dementia with behavioral disturbance who presented with progressively erratic behavior over months.  See longer summary from H&P on 2/9, but in brief, patient had been living in [place] for years until family were informed by neighbors that patient was wandering, appeared unbathed, and she moved to [facility]. Here, she lived with daughter for about 2 years, but in the last few months, she started to behave more impulsively, to at times seem aggressive and have verbal outbursts, severe insomnia, and so APS recommended the patient be brought to the ER.  In the ER, the patient was seen by Psychiatry and started on new Seroquel. She was  held pending geri-psych placement, which was delayed for several months.  Daytime behaviors improved on high dose Seroquel but continues with sundowning behaviors. Added Celexa as mood stabilizer along with Tegretol on 2/14.  Behaviors and nocturnal sleep also improved with the addition of scheduled twice daily Ativan.  Of note patient much more calm after her daughter brought up multiple items from home including a baby doll which the patient "cares for" regularly.   Subjective: Very awake.  Restless and agitated.  Hallucinating and talking to people who were not in the room.  Objective: Vitals with BMI 06/09/2020 06/08/2020 06/08/2020  Height - - -  Weight - - -  BMI - - -  Systolic 115 105 884  Diastolic 72 60 68  Pulse 65 77 82    Intake/Output Summary (Last 24 hours) at 06/09/2020 0807 Last data filed at 06/08/2020 1700 Gross per 24 hour  Intake 1200 ml  Output --  Net 1200 ml   Filed Weights   05/05/20 2100 05/12/20 2043 05/22/20 0302  Weight: 59 kg 59 kg 61.5 kg    Exam:  Constitutional: Awake, restless, appears to be comfortable Respiratory: Lungs are clear.  Room air Cardiovascular: S1-S2.  Regular pulse.  No peripheral edema Abdomen:  LBM 4/24, variable oral intake.  Prefers sweets.  Abdomen nontender with normoactive bowel sounds Neurologic: CN 2-12 grossly intact on visual inspection. nonfocal Psychiatric: Awake and restless.  Actively hallucinating and talking to people who were not in the room.  Oriented times name only.    Assessment/Plan: Acute problems: Dementia with behavioral disturbance Continue Seroquel-QTC 2/14 <500; 3/3 Qtc 420 ms Continue Tegretol 400 mg 8 am and 2 pm and 400 mg 6 pm.  Patient is on max dose of Tegretol  Continue Neurontin 400 mg daily at 3 PM to help address sundowning symptoms. Continue low-dose Ativan BID and Celexa 40 mg daily  2/14 reevaluated by PT/OT and given her status of custodial care not appropriate for acute  PT/OT @@Patient 's insurance company has requested repeat PT/OT/nutrition evaluations to assist clarify skilled needs in anticipation of discharge to skilled nursing facility. PT evaluation 4/27: Pt seen and evaluated with OT for safety this session. Slightly resistive during ROM on BLE, however, allowed PT to perform some ROM on BLE. Pt requiring total A +2 for repositioning in bed. Appropriately answering some questions, however, remains confused. Per previous notes, pt was ambulatory at a min A- min guard A level. Given current decline in mobility, feel she would benefit from SNF to increased independence and safety.Further skilled PT needs to be deferred to SNF.  OT evaluation 4/27: PTA, pt lived with daughter and was ambulatory per chart. Pt presents now requiring Total A for all ADLs and bed mobility. Pt with inconsistent following of commands but intermittent appropriate responses noted during session (see cognition section). Assessed B UE ROM with some difficulty due to pt resistance felt, but suspect elbow/shoulder contractures present. Pt repositioned in bed for increased comfort and skin integrity. Recommend SNF at DC due to pt below previous functional baseline. Will sign off at acute level and defer therapy needs to next venue of care.  Pressure injury Right anterior hip/Stage 2/resolved -Continue air mattress -According to nursing documentation wound has healed Pressure Injury 05/17/20 Left Stage 2 -  Partial thickness loss of dermis presenting as a shallow open injury with a red, pink wound bed without slough. (Active)  Date First Assessed/Time First Assessed: 05/17/20 1236   Location Orientation: Left  Staging: Stage 2 -  Partial thickness loss of dermis presenting as a shallow open injury with a red, pink wound bed without slough.    Assessments 05/17/2020 12:36 PM 06/08/2020 10:00 PM  Dressing Type Foam - Lift dressing to assess site every shift Foam - Lift dressing to assess site every  shift  Dressing -- Clean;Dry;Intact  Dressing Change Frequency -- PRN     No Linked orders to display   Urinary incontinence -Continue regular pericare  Other problems: Moderate to severe protein calorie malnutrition -Continuefeeding supplements Estimated body mass index is 21.88 kg/m as calculated from the following:   Height as of this encounter: 5\' 6"  (1.676 m).   Weight as of this encounter: 61.5 kg.  Transaminitis -Resolved   Data Reviewed: Basic Metabolic Panel: Recent Labs  Lab 06/05/20 0156  NA 136  K 3.5  CL 104  CO2 27  GLUCOSE 82  BUN 19  CREATININE 0.65  CALCIUM 9.2   Liver Function Tests: Recent Labs  Lab 06/05/20 0156  AST 13*  ALT 13  ALKPHOS 54  BILITOT 0.3  PROT 5.3*  ALBUMIN 3.1*   No results for input(s): LIPASE, AMYLASE in the last 168 hours. No results for input(s): AMMONIA in the last 168 hours. CBC: Recent Labs  Lab  06/05/20 0156  WBC 3.2*  HGB 11.5*  HCT 34.7*  MCV 96.7  PLT 266   Cardiac Enzymes: No results for input(s): CKTOTAL, CKMB, CKMBINDEX, TROPONINI in the last 168 hours. BNP (last 3 results) No results for input(s): BNP in the last 8760 hours.  ProBNP (last 3 results) No results for input(s): PROBNP in the last 8760 hours.  CBG: No results for input(s): GLUCAP in the last 168 hours.  No results found for this or any previous visit (from the past 240 hour(s)).   Studies: No results found.  Scheduled Meds: . carbamazepine  400 mg Oral BID   And  . carbamazepine  400 mg Oral QPM  . citalopram  40 mg Oral Daily  . enoxaparin (LOVENOX) injection  40 mg Subcutaneous Q24H  . feeding supplement  237 mL Oral BID BM  . gabapentin  400 mg Oral Daily  . LORazepam  0.5 mg Oral BID  . QUEtiapine  100 mg Oral BID  . QUEtiapine  150 mg Oral QHS   Continuous Infusions:  Principal Problem:   Dementia with behavioral disturbance (HCC) Active Problems:   Major depressive disorder, recurrent episode (HCC)    History of asthma, mild, intermittent   Early Onset Dementia with behavioral problem (HCC)   Protein-calorie malnutrition, moderate (HCC)   Transaminitis   Pressure injury of skin   Alzheimer disease (HCC)   Pressure ulcer of right hip, stage 2 (HCC)   Consultants:  Psychiatry  Procedures:  None  Antibiotics: Anti-infectives (From admission, onward)   None       Time spent: 20 minutes    Junious Silk ANP  [facility] 7 am - 330 pm/M-F for direct patient care and secure chat Please refer to Amion for contact info 126 days

## 2020-06-09 NOTE — Progress Notes (Addendum)
CSW spoke with Amy, RN CM of [employer] who provided CSW with information for appealing the denial for SNF from [facility].  NP consulted PT, OT, and nutrition for new assessments for submission to [facility] for review.   CSW will create and submit narrative document to support the patient's need for SNF prior to going into long term care, along with clinical documentation.  Edwin Dada, MSW, LCSW Transitions of Care  Clinical Social Worker II [phone]

## 2020-06-10 DIAGNOSIS — R400 Somnolence: Secondary | ICD-10-CM | POA: Diagnosis not present

## 2020-06-10 DIAGNOSIS — R296 Repeated falls: Secondary | ICD-10-CM | POA: Diagnosis not present

## 2020-06-10 DIAGNOSIS — S0121XA Laceration without foreign body of nose, initial encounter: Secondary | ICD-10-CM | POA: Diagnosis not present

## 2020-06-10 DIAGNOSIS — I10 Essential (primary) hypertension: Secondary | ICD-10-CM | POA: Diagnosis not present

## 2020-06-10 DIAGNOSIS — R5381 Other malaise: Secondary | ICD-10-CM | POA: Diagnosis not present

## 2020-06-10 DIAGNOSIS — S0181XA Laceration without foreign body of other part of head, initial encounter: Secondary | ICD-10-CM | POA: Diagnosis not present

## 2020-06-10 DIAGNOSIS — R6889 Other general symptoms and signs: Secondary | ICD-10-CM | POA: Diagnosis not present

## 2020-06-10 DIAGNOSIS — G319 Degenerative disease of nervous system, unspecified: Secondary | ICD-10-CM | POA: Diagnosis not present

## 2020-06-10 DIAGNOSIS — F0391 Unspecified dementia with behavioral disturbance: Secondary | ICD-10-CM | POA: Diagnosis not present

## 2020-06-10 DIAGNOSIS — Z79899 Other long term (current) drug therapy: Secondary | ICD-10-CM | POA: Diagnosis not present

## 2020-06-10 DIAGNOSIS — L89202 Pressure ulcer of unspecified hip, stage 2: Secondary | ICD-10-CM | POA: Diagnosis not present

## 2020-06-10 DIAGNOSIS — Z7401 Bed confinement status: Secondary | ICD-10-CM | POA: Diagnosis not present

## 2020-06-10 DIAGNOSIS — M6389 Disorders of muscle in diseases classified elsewhere, multiple sites: Secondary | ICD-10-CM | POA: Diagnosis not present

## 2020-06-10 DIAGNOSIS — W06XXXA Fall from bed, initial encounter: Secondary | ICD-10-CM | POA: Diagnosis not present

## 2020-06-10 DIAGNOSIS — E440 Moderate protein-calorie malnutrition: Secondary | ICD-10-CM | POA: Diagnosis not present

## 2020-06-10 DIAGNOSIS — F0281 Dementia in other diseases classified elsewhere with behavioral disturbance: Secondary | ICD-10-CM | POA: Diagnosis not present

## 2020-06-10 DIAGNOSIS — S0990XA Unspecified injury of head, initial encounter: Secondary | ICD-10-CM | POA: Diagnosis not present

## 2020-06-10 DIAGNOSIS — G309 Alzheimer's disease, unspecified: Secondary | ICD-10-CM | POA: Diagnosis not present

## 2020-06-10 DIAGNOSIS — L89212 Pressure ulcer of right hip, stage 2: Secondary | ICD-10-CM | POA: Diagnosis not present

## 2020-06-10 DIAGNOSIS — J4520 Mild intermittent asthma, uncomplicated: Secondary | ICD-10-CM | POA: Diagnosis not present

## 2020-06-10 DIAGNOSIS — S0083XA Contusion of other part of head, initial encounter: Secondary | ICD-10-CM | POA: Diagnosis not present

## 2020-06-10 DIAGNOSIS — Z743 Need for continuous supervision: Secondary | ICD-10-CM | POA: Diagnosis not present

## 2020-06-10 DIAGNOSIS — W19XXXA Unspecified fall, initial encounter: Secondary | ICD-10-CM | POA: Diagnosis not present

## 2020-06-10 DIAGNOSIS — I959 Hypotension, unspecified: Secondary | ICD-10-CM | POA: Diagnosis not present

## 2020-06-10 DIAGNOSIS — M2550 Pain in unspecified joint: Secondary | ICD-10-CM | POA: Diagnosis not present

## 2020-06-10 DIAGNOSIS — F17200 Nicotine dependence, unspecified, uncomplicated: Secondary | ICD-10-CM | POA: Diagnosis not present

## 2020-06-10 DIAGNOSIS — R451 Restlessness and agitation: Secondary | ICD-10-CM | POA: Diagnosis not present

## 2020-06-10 DIAGNOSIS — R58 Hemorrhage, not elsewhere classified: Secondary | ICD-10-CM | POA: Diagnosis not present

## 2020-06-10 DIAGNOSIS — R404 Transient alteration of awareness: Secondary | ICD-10-CM | POA: Diagnosis not present

## 2020-06-10 DIAGNOSIS — S199XXA Unspecified injury of neck, initial encounter: Secondary | ICD-10-CM | POA: Diagnosis not present

## 2020-06-10 DIAGNOSIS — F0390 Unspecified dementia without behavioral disturbance: Secondary | ICD-10-CM | POA: Diagnosis not present

## 2020-06-10 DIAGNOSIS — G300 Alzheimer's disease with early onset: Secondary | ICD-10-CM | POA: Diagnosis not present

## 2020-06-10 DIAGNOSIS — G4701 Insomnia due to medical condition: Secondary | ICD-10-CM | POA: Diagnosis not present

## 2020-06-10 DIAGNOSIS — Y9289 Other specified places as the place of occurrence of the external cause: Secondary | ICD-10-CM | POA: Diagnosis not present

## 2020-06-10 DIAGNOSIS — R279 Unspecified lack of coordination: Secondary | ICD-10-CM | POA: Diagnosis not present

## 2020-06-10 MED ORDER — ENSURE ENLIVE PO LIQD: 237.0000 mL | Freq: Three times a day (TID) | ORAL | Status: DC

## 2020-06-10 MED ORDER — ADULT MULTIVITAMIN W/MINERALS CH
1.0000 | ORAL_TABLET | Freq: Every day | ORAL | Status: DC
  Administered 2020-06-10: 1 via ORAL
  Filled 2020-06-10: qty 1

## 2020-06-10 MED ORDER — GABAPENTIN 400 MG PO CAPS: 400.0000 mg | ORAL_CAPSULE | Freq: Every day | ORAL | Status: DC

## 2020-06-10 NOTE — Progress Notes (Signed)
DISCHARGE NOTE SNF Sherry Hicks to be discharged Skilled nursing facility per MD order. Patient verbalized understanding.  Skin clean, dry and intact without evidence of skin break down, no evidence of skin tears noted. IV catheter discontinued intact. Site without signs and symptoms of complications. Dressing and pressure applied. Pt denies pain at the site currently. No complaints noted.  Patient free of lines, drains, and wounds.   Discharge packet assembled. An After Visit Summary (AVS) was printed and given to the EMS personnel. Patient escorted via stretcher and discharged to [facility] via ambulance. Report called to accepting facility; all questions and concerns addressed.   Carmin Muskrat, RN DISCHARGE NOTE SNF Sherry Hicks to be discharged Skilled nursing facility per MD order. Patient verbalized understanding.  Skin clean, dry and intact without evidence of skin break down, no evidence of skin tears noted. IV catheter discontinued intact. Site without signs and symptoms of complications. Dressing and pressure applied. Pt denies pain at the site currently. No complaints noted.  Patient free of lines, drains, and wounds.   Discharge packet assembled. An After Visit Summary (AVS) was printed and given to the EMS personnel. Patient escorted via stretcher and discharged to [facility] via ambulance. Report called to accepting facility; all questions and concerns addressed.   Carmin Muskrat, RN

## 2020-06-10 NOTE — Plan of Care (Signed)
  Problem: Malnutrition  (NI-5.2) Goal: Food and/or nutrient delivery Description: Individualized approach for food/nutrient provision. Outcome: Adequate for Discharge   

## 2020-06-10 NOTE — Progress Notes (Signed)
This RN has attempted to call report to [place] 3 times already. I have spoken to [place] and karen (receptiponist) , and every time I get transferred to a voicemail. Will attempt again.

## 2020-06-10 NOTE — Progress Notes (Addendum)
Initial Nutrition Assessment  DOCUMENTATION CODES:   Severe malnutrition in context of chronic illness  INTERVENTION:  -Ensure Enlive po TID, each supplement provides 350 kcal and 20 grams of protein -MVI with minerals daily -Continue meal assistance  NUTRITION DIAGNOSIS:   Severe Malnutrition related to chronic illness (dementia) as evidenced by severe muscle depletion,severe fat depletion.  GOAL:   Patient will meet greater than or equal to 90% of their needs  MONITOR:   PO intake,Supplement acceptance,Weight trends,Labs,I & O's,Skin  REASON FOR ASSESSMENT:   Consult Assessment of nutrition requirement/status,Other (Comment) ("insurance asking to clarify nutrition needs")  ASSESSMENT:   60 y.o. F with Alzheimer's dementia with behavioral disturbance who presented with progressively erratic behavior over months. Pt has been pending discharge to geriatric psychiatric facility.   Pt did not provide any meaningful responses to RD questions, and often provided no response at all. Per RN, pt has intermittent episodes of agitation in which she will yell out and appear to be having an argument with no one else present in the room. RN notes pt has had no issues with appetite and that pt often eats whatever is offered; last 8 meal completions documented as 100%. Pt on finger foods diet and is being assisted by staff for each meal. Pt also receiving Ensure BID and consumes supplement well per RN.   No wt loss noted upon review of weight history. Pt weighed 52.8 kg on 01/14/20 and 58.8 kg on 03/26/20 when admitted. Pt then noted to weigh 61.5 kg when weight was last updated on 05/22/20.  No UOP documented   Labs and medications reviewed (labs last updated 06/05/20)  NUTRITION - FOCUSED PHYSICAL EXAM:  Flowsheet Row Most Recent Value  Orbital Region Severe depletion  Upper Arm Region Severe depletion  Thoracic and Lumbar Region Severe depletion  Buccal Region Severe depletion  Temple  Region Severe depletion  Clavicle Bone Region Severe depletion  Clavicle and Acromion Bone Region Severe depletion  Scapular Bone Region Severe depletion  Dorsal Hand Severe depletion  Patellar Region Severe depletion  Anterior Thigh Region Severe depletion  Posterior Calf Region Severe depletion  Edema (RD Assessment) None  Hair Reviewed  Eyes Reviewed  Mouth Reviewed  Skin Reviewed  Nails Reviewed       Diet Order:   Diet Order            DIET FINGER FOODS Room service appropriate? Yes; Fluid consistency: Thin  Diet effective now                 EDUCATION NEEDS:   Not appropriate for education at this time  Skin:  Skin Assessment: Reviewed RN Assessment (per RN assessment, pt with stage 2 pressure injury but location is not properly documented)  Last BM:  4/26  Height:   Ht Readings from Last 1 Encounters:  03/26/20 5\' 6"  (1.676 m)    Weight:   Wt Readings from Last 1 Encounters:  05/22/20 61.5 kg    BMI:  Body mass index is 21.88 kg/m.  Estimated Nutritional Needs:   Kcal:  1900-2100  Protein:  95-105 grams  Fluid:  >1.9L/d    07/22/20, MS, RD, LDN RD pager number and weekend/on-call pager number located in Amion.

## 2020-06-10 NOTE — Progress Notes (Addendum)
11:50am: CSW was notified by Sherry Hicks that the patient can come to the facility today.   Patient will go to [facility] via Sherry Hicks - [facility]AR has been scheduled for pick up at 4pm. The number to call for report is [phone].   Patient's daughter and Sherry Norrie, RN aware of discharge plan.  10am: CSW received confirmation from Sherry Hicks at [facility] who states the facility can accept the patient with an LOG.  CSW spoke with patient's daughter Sherry Hicks to inform her of plan - she is agreeable.  Sherry Hicks, MSW, LCSW Transitions of Care  Clinical Social Worker II [phone]

## 2020-06-10 NOTE — Progress Notes (Addendum)
Subjective: Extremely verbose, talking continuously to self  Exam: Vital signs are stable Gen: Awake alert, with active hallucinations, talking continuously to people who were not in the room CVS: S1-S2, regular rate rhythm Lungs: Clear bilaterally Abdomen: Soft, nontender Extremities: No edema, contracted lower extremities  Assessment and plan -60 year old female with advanced dementia with behavioral disorder, Psych disorder NOS,  bedbound and almost total care -Needs long-term placement -TOC, difficult to place team following  Zannie Cove, MD

## 2020-06-10 NOTE — Progress Notes (Signed)
Report given to [place] at [facility].

## 2020-06-10 NOTE — Discharge Summary (Addendum)
Physician Discharge Summary  Sherry Hicks HYW:[id number] DOB: Feb 20, 1960 DOA: 01/27/2020  PCP: Sherry Jumbo, DO  Admit date: 01/27/2020 Discharge date: 06/10/2020  Time spent: 35 minutes  Recommendations for Outpatient Follow-up:  1. SNF for long-term care 2. Recommend palliative care evaluation at SNF   Discharge Diagnoses:  Principal Problem:   Dementia with behavioral disturbance (HCC) Active Problems:   Major depressive disorder, recurrent episode (HCC)   History of asthma, mild, intermittent   Early Onset Dementia with behavioral problem (HCC)   Protein-calorie malnutrition, moderate (HCC)   Transaminitis   Pressure injury of skin   Alzheimer disease (HCC)   Pressure ulcer of right hip, stage 2 (HCC)   Discharge Condition: Stable  Diet recommendation: Regular  Filed Weights   05/05/20 2100 05/12/20 2043 05/22/20 0302  Weight: 59 kg 59 kg 61.5 kg    History of present illness:  Sherry Hicks is a 60 y.o. F with hx dementia, suspected psychiatric disorder NOS, and suspected substance use history who presented with progressively erratic behavior over months.  Caveat that patient has advanced cognitive impairment, and all history collected from daughter by phone and from chart.  For excellent Neurological history and assessment, please see office note from Dr. Karel Jarvis from 10/14/2019.    Patient was living independently in [place] for many years until about 4-5 years ago, daughter started to notice she was more "distracted" on the phone and then 2 years ago family received reports she was behaving erratically (wandering outside, not bathing, speaking aggressively).  Family brought her to [place], and she lived briefly with sister and then moved in with daughter here in [place] for the last ~18 months.  Since being here, she has continued to have cognitive decline.  On [facility] evaluation in Dec 2020 she had a MOCA 3/30, MRI brain that showed generalized atrophy, was  diagnosed with Alzheimer's with an underlying untreated psychiatric disorder in the setting of prior drug use.  Although completely sober from illicit substances obviously since moving in with her daughter, her cognitive impairment has gradually worsened in the last year to the point that she can now no longer answer really any questions coherently.   Eventually, in the last few months, she was working with LCSW at her clinic for placement.  Unfortunately, APS were called, and they recommended to family that she be brought to the ER, because otherwise daughter might be liable in some manner if the patient were injured or injured someone else.  Patient was brought to the ER on Jan 27, 2020.  She has been here since then, in psychiatric holding, awaiting placement.  She has been started on Seroquel, and since then awaiting placement.  Hospital Course:  60 y.o.Fwith Alzheimer's dementia with behavioral disturbance who presented with progressively erratic behavior over months.  See longer summary from H&P on 2/9, but in brief, patient had been living in [place] for years until family were informed by neighbors that patient was wandering, appeared unbathed, and she moved to [facility]. Here, she lived with daughter for about 2 years, but in the last few months, she started to behave more impulsively, to at times seem aggressive and have verbal outbursts, severe insomnia, and so APS recommended the patient be brought to the ER.  In the ER, the patient was seen by Psychiatry and started on Seroquel. She was held pending geri-psych placement, which was delayed for several months. Daytime behaviors improved on higher dose Seroquel but continued with sundowning behaviors.  Subsequently  started on Celexa as mood stabilizer along with Tegretol on 2/14.  Behaviors and nocturnal sleep also improved with the addition of scheduled twice daily Ativan.  Of note patient much more calm after her daughter brought up  multiple items from home including a baby doll which the patient "cares for" regularly.  Dementia with behavioral disturbance Continue Seroquel-QTC 2/14 <500; 3/3 Qtc 420 ms Continue Tegretol 400 mg 8 am and 2 pm and 400 mg 6 pm.  Patient is on max dose of Tegretol  Continue Neurontin 400 mg daily at 3 PM to help address sundowning symptoms. Continue low-dose Ativan BID and Celexa 40 mg daily  2/14 reevaluated by PT/OT and given her status of custodial care not appropriate for acute PT/OT -Discharge to SNF for long-term care -Please recheck EKG for QTC periodically  Pressure injury Right anterior hip/Stage 2 -Continue air mattress -According to nursing documentation wound has healed  Urinary incontinence -Continue regular pericare  Moderate to severe protein calorie malnutrition -Continuefeeding supplements Estimated body mass index is 21  Transaminitis -Resolved     Consultations:  Psychiatry  Discharge Exam: Vitals:   06/10/20 0503 06/10/20 1007  BP: 128/81 (!) 148/77  Pulse: 61 74  Resp: 18 16  Temp: 98.1 F (36.7 C) 98.9 F (37.2 C)  SpO2: 97% 92%    General: Awake alert, oriented to self only, extremely verbose Cardiovascular: S1-S2, regular rate rhythm Respiratory: Clear  Discharge Instructions    Allergies as of 06/10/2020      Reactions   Hydrocodone-acetaminophen Nausea And Vomiting   Donepezil Other (See Comments)   Urinary Incontinence      Medication List    STOP taking these medications   traZODone 100 MG tablet Commonly known as: DESYREL     TAKE these medications   acetaminophen 500 MG tablet Commonly known as: TYLENOL Take 500 mg by mouth every 6 (six) hours as needed for moderate pain.   carbamazepine 200 MG tablet Commonly known as: TEGRETOL Take 2 tablets (400 mg total) by mouth 2 (two) times daily.   carbamazepine 200 MG tablet Commonly known as: TEGRETOL Take 2 tablets (400 mg total) by mouth every evening.    citalopram 40 MG tablet Commonly known as: CELEXA Take 1 tablet (40 mg total) by mouth daily.   Ensure Plus Liqd Take 1 Can by mouth 2 (two) times daily between meals.   gabapentin 400 MG capsule Commonly known as: NEURONTIN Take 1 capsule (400 mg total) by mouth daily after lunch.   LORazepam 0.5 MG tablet Commonly known as: ATIVAN Take 1 tablet (0.5 mg total) by mouth 2 (two) times daily.   QUEtiapine 100 MG tablet Commonly known as: SEROQUEL Take 1 tablet (100 mg total) by mouth 2 (two) times daily.   QUEtiapine 50 MG tablet Commonly known as: SEROQUEL Take 3 tablets (150 mg total) by mouth at bedtime.      Allergies  Allergen Reactions  . Hydrocodone-Acetaminophen Nausea And Vomiting  . Donepezil Other (See Comments)    Urinary Incontinence      The results of significant diagnostics from this hospitalization (including imaging, microbiology, ancillary and laboratory) are listed below for reference.    Significant Diagnostic Studies: No results found.  Microbiology: No results found for this or any previous visit (from the past 240 hour(s)).   Labs: Basic Metabolic Panel: Recent Labs  Lab 06/05/20 0156  NA 136  K 3.5  CL 104  CO2 27  GLUCOSE 82  BUN 19  CREATININE 0.65  CALCIUM 9.2   Liver Function Tests: Recent Labs  Lab 06/05/20 0156  AST 13*  ALT 13  ALKPHOS 54  BILITOT 0.3  PROT 5.3*  ALBUMIN 3.1*   No results for input(s): LIPASE, AMYLASE in the last 168 hours. No results for input(s): AMMONIA in the last 168 hours. CBC: Recent Labs  Lab 06/05/20 0156  WBC 3.2*  HGB 11.5*  HCT 34.7*  MCV 96.7  PLT 266   Cardiac Enzymes: No results for input(s): CKTOTAL, CKMB, CKMBINDEX, TROPONINI in the last 168 hours. BNP: BNP (last 3 results) No results for input(s): BNP in the last 8760 hours.  ProBNP (last 3 results) No results for input(s): PROBNP in the last 8760 hours.  CBG: No results for input(s): GLUCAP in the last 168  hours.     Signed:  Zannie Cove MD.  [facility] 06/10/2020, 12:05 PM

## 2020-06-12 ENCOUNTER — Emergency Department (HOSPITAL_COMMUNITY)
Admission: EM | Admit: 2020-06-12 | Discharge: 2020-06-12 | Disposition: A | Discharge status: Home / Self Care | Payer: Medicare Other | Attending: Emergency Medicine | Admitting: Emergency Medicine

## 2020-06-12 ENCOUNTER — Emergency Department (HOSPITAL_COMMUNITY): Payer: Medicare Other

## 2020-06-12 ENCOUNTER — Other Ambulatory Visit: Payer: Self-pay

## 2020-06-12 DIAGNOSIS — G319 Degenerative disease of nervous system, unspecified: Secondary | ICD-10-CM | POA: Diagnosis not present

## 2020-06-12 DIAGNOSIS — F0390 Unspecified dementia without behavioral disturbance: Secondary | ICD-10-CM | POA: Diagnosis not present

## 2020-06-12 DIAGNOSIS — I10 Essential (primary) hypertension: Secondary | ICD-10-CM | POA: Diagnosis not present

## 2020-06-12 DIAGNOSIS — M6389 Disorders of muscle in diseases classified elsewhere, multiple sites: Secondary | ICD-10-CM | POA: Diagnosis not present

## 2020-06-12 DIAGNOSIS — J4520 Mild intermittent asthma, uncomplicated: Secondary | ICD-10-CM | POA: Insufficient documentation

## 2020-06-12 DIAGNOSIS — W06XXXA Fall from bed, initial encounter: Secondary | ICD-10-CM | POA: Insufficient documentation

## 2020-06-12 DIAGNOSIS — S0121XA Laceration without foreign body of nose, initial encounter: Secondary | ICD-10-CM | POA: Insufficient documentation

## 2020-06-12 DIAGNOSIS — W19XXXA Unspecified fall, initial encounter: Secondary | ICD-10-CM

## 2020-06-12 DIAGNOSIS — I959 Hypotension, unspecified: Secondary | ICD-10-CM | POA: Diagnosis not present

## 2020-06-12 DIAGNOSIS — Z743 Need for continuous supervision: Secondary | ICD-10-CM | POA: Diagnosis not present

## 2020-06-12 DIAGNOSIS — F17200 Nicotine dependence, unspecified, uncomplicated: Secondary | ICD-10-CM | POA: Insufficient documentation

## 2020-06-12 DIAGNOSIS — R58 Hemorrhage, not elsewhere classified: Secondary | ICD-10-CM | POA: Diagnosis not present

## 2020-06-12 DIAGNOSIS — R279 Unspecified lack of coordination: Secondary | ICD-10-CM | POA: Diagnosis not present

## 2020-06-12 DIAGNOSIS — R404 Transient alteration of awareness: Secondary | ICD-10-CM | POA: Diagnosis not present

## 2020-06-12 DIAGNOSIS — R5381 Other malaise: Secondary | ICD-10-CM | POA: Diagnosis not present

## 2020-06-12 DIAGNOSIS — S0181XA Laceration without foreign body of other part of head, initial encounter: Secondary | ICD-10-CM | POA: Diagnosis not present

## 2020-06-12 DIAGNOSIS — S0990XA Unspecified injury of head, initial encounter: Secondary | ICD-10-CM

## 2020-06-12 DIAGNOSIS — S199XXA Unspecified injury of neck, initial encounter: Secondary | ICD-10-CM | POA: Diagnosis not present

## 2020-06-12 MED ORDER — LORAZEPAM 2 MG/ML IJ SOLN
1.0000 mg | Freq: Once | INTRAMUSCULAR | Status: AC
  Administered 2020-06-12: 1 mg via INTRAMUSCULAR
  Filled 2020-06-12: qty 1

## 2020-06-12 MED ORDER — LORAZEPAM 2 MG/ML IJ SOLN: 1.0000 mg | Freq: Once | INTRAMUSCULAR | Status: DC

## 2020-06-12 NOTE — ED Provider Notes (Signed)
She was seen by Dr. Rodena Medin.  She was found appropriate for discharge.  Did need Ativan earlier due to history of Alzheimer's with behavioral disturbances.  Apparently nursing went to roll patient to change her and started to became agitated.  She appears to be at her baseline.  We will order some additional Ativan.   Doris Mcgilvery A, PA-C 06/12/20 1638    Melene Plan, DO 06/12/20 1706

## 2020-06-12 NOTE — ED Notes (Signed)
Patient resting comfortably. Update provided to daughter.

## 2020-06-12 NOTE — ED Notes (Signed)
Patient became agitated after changing bed while waiting for transport. PA Britni made aware.

## 2020-06-12 NOTE — ED Provider Notes (Signed)
[facility]-EMERGENCY DEPT Provider Note   CSN: [id number] Arrival date & time: 06/12/20  1142     History Chief Complaint  Patient presents with  . Fall    Sherry Hicks is a 60 y.o. female.  60 year old female with prior medical history as detailed below presents for evaluation.  Patient arrives by EMS.  Patient with history of advanced dementia.  Patient had a fall from her bed to the floor.  She struck her right forehead against the floor.  She was on the floor for approximately 10 minutes.  Upon arrival she is quite agitated secondary to her dementia.  She is without evidence of significant trauma other than to the right forehead and nose.  The history is provided by the patient and medical records.  Fall This is a new problem. The current episode started less than 1 hour ago. The problem occurs constantly. The problem has not changed since onset.Pertinent negatives include no chest pain and no abdominal pain. Nothing aggravates the symptoms. Nothing relieves the symptoms.       Past Medical History:  Diagnosis Date  . Alteration in performance of activities of daily living 08/29/2019  . Apraxia 08/29/2019  . Arthritis   . Dementia (HCC)   . Dementia with behavioral problem (HCC)   . History of asthma, mild, intermittent 02/14/2015  . Hypertension   . Smoker     Patient Active Problem List   Diagnosis Date Noted  . Pressure ulcer of right hip, stage 2 (HCC)   . Alzheimer disease (HCC) 04/04/2020  . Pressure injury of skin 04/02/2020  . Protein-calorie malnutrition, moderate (HCC)   . Transaminitis   . Dementia with behavioral disturbance (HCC) 03/24/2020  . History of mood disorder 08/29/2019  . Apraxia 08/29/2019  . Alteration in performance of activities of daily living 08/29/2019  . Caregiver stress 08/29/2019  . Early Onset Dementia with behavioral problem (HCC)   . Hyperlipidemia 07/30/2019  . Weight loss, unintentional 07/06/2019  .  Hypernatremia 07/06/2019  . History of asthma, mild, intermittent 2017  . Major depressive disorder, recurrent episode (HCC) 04/12/2006  . HYPERTENSION, BENIGN SYSTEMIC 04/12/2006    Past Surgical History:  Procedure Laterality Date  . ANKLE SURGERY    . SALPINGOOPHORECTOMY    . TOTAL ABDOMINAL HYSTERECTOMY W/ BILATERAL SALPINGOOPHORECTOMY     for menorrhagia and pregnancy loss) and no longer requires GYN screening     OB History   No obstetric history on file.     Family History  Problem Relation Age of Onset  . Hypertension Mother   . Dementia Mother   . Asthma Father   . Cancer Father   . Canavan disease Sister   . Cancer Brother   . Diabetes Maternal Grandmother   . Hypertension Maternal Grandmother   . Asthma Paternal Grandfather   . Cancer Paternal Grandfather   . Cancer Other   . Diabetes Other   . Dementia Paternal Grandmother     Social History   Tobacco Use  . Smoking status: Current Every Day Smoker  . Smokeless tobacco: Never Used  Vaping Use  . Vaping Use: Never used  Substance Use Topics  . Alcohol use: No  . Drug use: No    Home Medications Prior to Admission medications   Medication Sig Start Date End Date Taking? Authorizing Provider  acetaminophen (TYLENOL) 500 MG tablet Take 500 mg by mouth every 6 (six) hours as needed for moderate pain.    [provider]  carbamazepine (TEGRETOL) 200 MG tablet Take 2 tablets (400 mg total) by mouth 2 (two) times daily. 05/28/20   Russella Dar, NP  carbamazepine (TEGRETOL) 200 MG tablet Take 2 tablets (400 mg total) by mouth every evening. 05/28/20   Russella Dar, NP  citalopram (CELEXA) 40 MG tablet Take 1 tablet (40 mg total) by mouth daily. 05/28/20   Russella Dar, NP  Ensure Plus (ENSURE PLUS) LIQD Take 1 Can by mouth 2 (two) times daily between meals. Patient not taking: No sig reported 11/18/19   [name], [name], DO  gabapentin (NEURONTIN) 400 MG capsule Take 1 capsule (400 mg  total) by mouth daily after lunch. 06/10/20   Zannie Cove, MD  LORazepam (ATIVAN) 0.5 MG tablet Take 1 tablet (0.5 mg total) by mouth 2 (two) times daily. 05/28/20   Russella Dar, NP  QUEtiapine (SEROQUEL) 100 MG tablet Take 1 tablet (100 mg total) by mouth 2 (two) times daily. 05/28/20   Russella Dar, NP  QUEtiapine (SEROQUEL) 50 MG tablet Take 3 tablets (150 mg total) by mouth at bedtime. 05/28/20   Russella Dar, NP    Allergies    Hydrocodone-acetaminophen and Donepezil  Review of Systems   Review of Systems  Cardiovascular: Negative for chest pain.  Gastrointestinal: Negative for abdominal pain.  All other systems reviewed and are negative.   Physical Exam Updated Vital Signs BP 106/81 (BP Location: Right Arm) Comment: Simultaneous filing. User may not have seen previous data. Comment (BP Location): Simultaneous filing. User may not have seen previous data.  Pulse 84 Comment: Simultaneous filing. User may not have seen previous data.  Temp 98.3 F (36.8 C) (Oral)   Resp 16 Comment: Simultaneous filing. User may not have seen previous data.  Ht 5\' 6"  (1.676 m)   Wt 45.4 kg   SpO2 100% Comment: Simultaneous filing. User may not have seen previous data.  BMI 16.14 kg/m   Physical Exam Vitals and nursing note reviewed.  Constitutional:      General: She is not in acute distress.    Appearance: Normal appearance. She is well-developed.  HENT:     Head: Normocephalic.     Comments: Superficial laceration approximately 0.5 cm just above the right eyebrow on the right forehead.    Superficial laceration - also 0.5 cm -  to the right medial aspect of the nasal bridge.   Eyes:     Conjunctiva/sclera: Conjunctivae normal.     Pupils: Pupils are equal, round, and reactive to light.  Cardiovascular:     Rate and Rhythm: Normal rate and regular rhythm.     Heart sounds: Normal heart sounds.  Pulmonary:     Effort: Pulmonary effort is normal. No respiratory distress.      Breath sounds: Normal breath sounds.  Abdominal:     General: There is no distension.     Palpations: Abdomen is soft.     Tenderness: There is no abdominal tenderness.  Musculoskeletal:        General: No deformity. Normal range of motion.     Cervical back: Normal range of motion and neck supple.  Skin:    General: Skin is warm and dry.  Neurological:     General: No focal deficit present.     Mental Status: She is alert and oriented to person, place, and time. Mental status is at baseline.     Comments: Dementia - significant agitation on arrival  ED Results / Procedures / Treatments   Labs (all labs ordered are listed, but only abnormal results are displayed) Labs Reviewed - No data to display  EKG None  Radiology CT Head Wo Contrast  Result Date: 06/12/2020 CLINICAL DATA:  60 year old female with head and neck injury from fall today. History of dementia. EXAM: CT HEAD WITHOUT CONTRAST CT CERVICAL SPINE WITHOUT CONTRAST TECHNIQUE: Multidetector CT imaging of the head and cervical spine was performed following the standard protocol without intravenous contrast. Multiplanar CT image reconstructions of the cervical spine were also generated. COMPARISON:  11/18/2018 prior CTs FINDINGS: CT HEAD FINDINGS Brain: No evidence of acute infarction, hemorrhage, hydrocephalus, extra-axial collection or mass lesion/mass effect. Atrophy and probable chronic small-vessel white matter ischemic changes are again noted. Ventriculomegaly is not significantly changed from 2020. Vascular: No hyperdense vessel or unexpected calcification. Skull: Normal. Negative for fracture or focal lesion. Sinuses/Orbits: No acute finding. Other: None CT CERVICAL SPINE FINDINGS Alignment: Normal. Skull base and vertebrae: No acute fracture. No primary bone lesion or focal pathologic process. Soft tissues and spinal canal: No prevertebral fluid or swelling. No visible canal hematoma. Disc levels:  Unremarkable  Upper chest: Negative. Other: None IMPRESSION: 1. No evidence of acute intracranial abnormality. Atrophy and probable chronic small-vessel white matter ischemic changes. 2. No static evidence of acute injury to the cervical spine. Electronically Signed   By: Harmon Pier M.D.   On: 06/12/2020 13:25   CT Cervical Spine Wo Contrast  Result Date: 06/12/2020 CLINICAL DATA:  60 year old female with head and neck injury from fall today. History of dementia. EXAM: CT HEAD WITHOUT CONTRAST CT CERVICAL SPINE WITHOUT CONTRAST TECHNIQUE: Multidetector CT imaging of the head and cervical spine was performed following the standard protocol without intravenous contrast. Multiplanar CT image reconstructions of the cervical spine were also generated. COMPARISON:  11/18/2018 prior CTs FINDINGS: CT HEAD FINDINGS Brain: No evidence of acute infarction, hemorrhage, hydrocephalus, extra-axial collection or mass lesion/mass effect. Atrophy and probable chronic small-vessel white matter ischemic changes are again noted. Ventriculomegaly is not significantly changed from 2020. Vascular: No hyperdense vessel or unexpected calcification. Skull: Normal. Negative for fracture or focal lesion. Sinuses/Orbits: No acute finding. Other: None CT CERVICAL SPINE FINDINGS Alignment: Normal. Skull base and vertebrae: No acute fracture. No primary bone lesion or focal pathologic process. Soft tissues and spinal canal: No prevertebral fluid or swelling. No visible canal hematoma. Disc levels:  Unremarkable Upper chest: Negative. Other: None IMPRESSION: 1. No evidence of acute intracranial abnormality. Atrophy and probable chronic small-vessel white matter ischemic changes. 2. No static evidence of acute injury to the cervical spine. Electronically Signed   By: Harmon Pier M.D.   On: 06/12/2020 13:25    Procedures .Marland KitchenLaceration Repair  Date/Time: 06/12/2020 1:09 PM Performed by: Wynetta Fines, MD Authorized by: Wynetta Fines, MD    Consent:    Consent obtained:  Verbal   Consent given by:  Patient   Risks, benefits, and alternatives were discussed: yes     Risks discussed:  Need for additional repair, nerve damage, infection, pain, poor cosmetic result, poor wound healing, retained foreign body, tendon damage and vascular damage   Alternatives discussed:  No treatment Universal protocol:    Procedure explained and questions answered to patient or proxy's satisfaction: yes     Relevant documents present and verified: yes     Test results available: yes     Imaging studies available: yes     Required blood products, implants, devices, and  special equipment available: yes     Site/side marked: yes     Immediately prior to procedure, a time out was called: yes     Patient identity confirmed:  Verbally with patient Anesthesia:    Anesthesia method:  None Laceration details:    Location: right forehead and right nare of nose.   Length (cm):  0.5 Exploration:    Limited defect created (wound extended): no     Hemostasis achieved with:  Direct pressure Treatment:    Amount of cleaning:  Standard   Irrigation solution:  Sterile saline Skin repair:    Repair method:  Tissue adhesive Post-procedure details:    Dressing:  Open (no dressing)   Procedure completion:  Tolerated     Medications Ordered in ED Medications  LORazepam (ATIVAN) injection 1 mg (1 mg Intramuscular Given 06/12/20 1223)    ED Course  I have reviewed the triage vital signs and the nursing notes.  Pertinent labs & imaging results that were available during my care of the patient were reviewed by me and considered in my medical decision making (see chart for details).    MDM Rules/Calculators/A&P                          MDM  MSE complete   Sherry Hicks was evaluated in Emergency Department on 06/12/2020 for the symptoms described in the history of present illness. She was evaluated in the context of the global COVID-19 pandemic,  which necessitated consideration that the patient might be at risk for infection with the SARS-CoV-2 virus that causes COVID-19. Institutional protocols and algorithms that pertain to the evaluation of patients at risk for COVID-19 are in a state of rapid change based on information released by regulatory bodies including the CDC and federal and state organizations. These policies and algorithms were followed during the patient's care in the ED.  Patient fell from bed.    She had superficial lacerations to the right forehead.  These were repaired without difficulty with dermabond.  Imaging was without evidence of significant traumatic injury.  Patient is appropriate for discharge.  Importance of close follow-up is stressed.  Strict return precautions given and understood.    Final Clinical Impression(s) / ED Diagnoses Final diagnoses:  Fall, initial encounter  Injury of head, initial encounter  Laceration of forehead, initial encounter    Rx / DC Orders ED Discharge Orders    None       Wynetta FinesMessick, Sincere Berlanga C, MD 06/12/20 1414

## 2020-06-12 NOTE — ED Notes (Signed)
Patient resting comfortably with eyes closed. Equal chest rise and fall noted. 

## 2020-06-12 NOTE — ED Notes (Signed)
[facility] called for transport.  

## 2020-06-12 NOTE — ED Notes (Signed)
Patient resting comfortably

## 2020-06-12 NOTE — Discharge Instructions (Signed)
Please return for any problem.  °

## 2020-06-12 NOTE — ED Triage Notes (Signed)
Pt came from [facility] via EMS. Pt fell out of bed, unwitnessed. Pt was on the ground approx. 10 minutes. Lac and hematoma above right eyebrow. Pt may also have abrasion on right side of bridge of nose. PMH of dementia with behavioral disturbances, constant inappropriate words, uncooperative. No obvious injury or pt complaints otherwise. Pt @ baseline according to staff at [facility] and daughter. Daughter bedside. EMS picked up pt using blanket pt was laying on. 5 mg of haldol IM  134/74 74 bpm 161 CBG

## 2020-06-12 NOTE — ED Notes (Signed)
Daughter made aware patient is being transported back to [place] at this time. [facility] made aware patient is being transported at this time.

## 2020-06-14 DIAGNOSIS — R296 Repeated falls: Secondary | ICD-10-CM | POA: Diagnosis not present

## 2020-06-14 DIAGNOSIS — E440 Moderate protein-calorie malnutrition: Secondary | ICD-10-CM | POA: Diagnosis not present

## 2020-06-14 DIAGNOSIS — G300 Alzheimer's disease with early onset: Secondary | ICD-10-CM | POA: Diagnosis not present

## 2020-06-14 DIAGNOSIS — L89202 Pressure ulcer of unspecified hip, stage 2: Secondary | ICD-10-CM | POA: Diagnosis not present

## 2020-06-17 ENCOUNTER — Emergency Department (HOSPITAL_COMMUNITY): Payer: Medicare Other

## 2020-06-17 ENCOUNTER — Emergency Department (HOSPITAL_COMMUNITY)
Admission: EM | Admit: 2020-06-17 | Discharge: 2020-06-17 | Disposition: A | Discharge status: Home / Self Care | Payer: Medicare Other | Attending: Emergency Medicine | Admitting: Emergency Medicine

## 2020-06-17 DIAGNOSIS — J4520 Mild intermittent asthma, uncomplicated: Secondary | ICD-10-CM | POA: Diagnosis not present

## 2020-06-17 DIAGNOSIS — F17200 Nicotine dependence, unspecified, uncomplicated: Secondary | ICD-10-CM | POA: Insufficient documentation

## 2020-06-17 DIAGNOSIS — I10 Essential (primary) hypertension: Secondary | ICD-10-CM | POA: Diagnosis not present

## 2020-06-17 DIAGNOSIS — F0281 Dementia in other diseases classified elsewhere with behavioral disturbance: Secondary | ICD-10-CM | POA: Diagnosis not present

## 2020-06-17 DIAGNOSIS — R451 Restlessness and agitation: Secondary | ICD-10-CM | POA: Diagnosis not present

## 2020-06-17 DIAGNOSIS — R5381 Other malaise: Secondary | ICD-10-CM | POA: Diagnosis not present

## 2020-06-17 DIAGNOSIS — G309 Alzheimer's disease, unspecified: Secondary | ICD-10-CM | POA: Insufficient documentation

## 2020-06-17 DIAGNOSIS — S0990XA Unspecified injury of head, initial encounter: Secondary | ICD-10-CM | POA: Diagnosis present

## 2020-06-17 DIAGNOSIS — Y9289 Other specified places as the place of occurrence of the external cause: Secondary | ICD-10-CM | POA: Insufficient documentation

## 2020-06-17 DIAGNOSIS — W06XXXA Fall from bed, initial encounter: Secondary | ICD-10-CM | POA: Insufficient documentation

## 2020-06-17 DIAGNOSIS — R279 Unspecified lack of coordination: Secondary | ICD-10-CM | POA: Diagnosis not present

## 2020-06-17 DIAGNOSIS — W19XXXA Unspecified fall, initial encounter: Secondary | ICD-10-CM

## 2020-06-17 DIAGNOSIS — S0083XA Contusion of other part of head, initial encounter: Secondary | ICD-10-CM | POA: Diagnosis not present

## 2020-06-17 DIAGNOSIS — R400 Somnolence: Secondary | ICD-10-CM | POA: Diagnosis not present

## 2020-06-17 DIAGNOSIS — Z743 Need for continuous supervision: Secondary | ICD-10-CM | POA: Diagnosis not present

## 2020-06-17 MED ORDER — LORAZEPAM 2 MG/ML IJ SOLN
1.0000 mg | Freq: Once | INTRAMUSCULAR | Status: AC
  Administered 2020-06-17: 1 mg via INTRAMUSCULAR
  Filled 2020-06-17: qty 1

## 2020-06-17 MED ORDER — LORAZEPAM 2 MG/ML IJ SOLN
1.0000 mg | Freq: Once | INTRAMUSCULAR | Status: AC
  Administered 2020-06-17: 1 mg via INTRAMUSCULAR

## 2020-06-17 MED ORDER — LORAZEPAM 2 MG/ML IJ SOLN
1.0000 mg | Freq: Once | INTRAMUSCULAR | Status: DC
  Filled 2020-06-17: qty 1

## 2020-06-17 NOTE — ED Notes (Signed)
Pt transferred into room 34 as per Dr Rosalia Hammers request for exam of pt. Pt is uncooperative again at this time

## 2020-06-17 NOTE — ED Notes (Signed)
Pt is allowing pulse ox placement at this time

## 2020-06-17 NOTE — ED Notes (Addendum)
Pt increasingly agitated, yelling out nonsensical statements, pulling off blankets and monitoring leads. Attempts to redirect unsuccessful.  MD made aware.

## 2020-06-17 NOTE — ED Provider Notes (Addendum)
[facility] EMERGENCY DEPARTMENT Provider Note   CSN: [id number] Arrival date & time: 06/17/20  1247     History Chief Complaint  Patient presents with  . Fall    Sherry Hicks is a 60 y.o. female.  HPI 5 caveat secondary to dementia 60 year old female transported by EMS with reports that she was found on the ground beside her bed and had a bump to her head.  Patient's baseline status is dementia with agitation.  Per report from the facility and her daughter patient is at her baseline mental status.  I was unable to elicit from staff how far the fall was.  She was found on the mat beside her bed which was reported to be set in the lowest position.  They noted that she had a bump to her head saying it was on the front left hand side of her head.  They report that they got her to a gurney chair and that was per EMS received her.  No other injuries were noted.  I also discussed the patient care with her daughter    Past Medical History:  Diagnosis Date  . Alteration in performance of activities of daily living 08/29/2019  . Apraxia 08/29/2019  . Arthritis   . Dementia (HCC)   . Dementia with behavioral problem (HCC)   . History of asthma, mild, intermittent 02/14/2015  . Hypertension   . Smoker     Patient Active Problem List   Diagnosis Date Noted  . Pressure ulcer of right hip, stage 2 (HCC)   . Alzheimer disease (HCC) 04/04/2020  . Pressure injury of skin 04/02/2020  . Protein-calorie malnutrition, moderate (HCC)   . Transaminitis   . Dementia with behavioral disturbance (HCC) 03/24/2020  . History of mood disorder 08/29/2019  . Apraxia 08/29/2019  . Alteration in performance of activities of daily living 08/29/2019  . Caregiver stress 08/29/2019  . Early Onset Dementia with behavioral problem (HCC)   . Hyperlipidemia 07/30/2019  . Weight loss, unintentional 07/06/2019  . Hypernatremia 07/06/2019  . History of asthma, mild, intermittent 2017  . Major  depressive disorder, recurrent episode (HCC) 04/12/2006  . HYPERTENSION, BENIGN SYSTEMIC 04/12/2006    Past Surgical History:  Procedure Laterality Date  . ANKLE SURGERY    . SALPINGOOPHORECTOMY    . TOTAL ABDOMINAL HYSTERECTOMY W/ BILATERAL SALPINGOOPHORECTOMY     for menorrhagia and pregnancy loss) and no longer requires GYN screening     OB History   No obstetric history on file.     Family History  Problem Relation Age of Onset  . Hypertension Mother   . Dementia Mother   . Asthma Father   . Cancer Father   . Canavan disease Sister   . Cancer Brother   . Diabetes Maternal Grandmother   . Hypertension Maternal Grandmother   . Asthma Paternal Grandfather   . Cancer Paternal Grandfather   . Cancer Other   . Diabetes Other   . Dementia Paternal Grandmother     Social History   Tobacco Use  . Smoking status: Current Every Day Smoker  . Smokeless tobacco: Never Used  Vaping Use  . Vaping Use: Never used  Substance Use Topics  . Alcohol use: No  . Drug use: No    Home Medications Prior to Admission medications   Medication Sig Start Date End Date Taking? Authorizing Provider  acetaminophen (TYLENOL) 500 MG tablet Take 500 mg by mouth every 6 (six) hours as needed for  moderate pain.    [provider]  carbamazepine (TEGRETOL) 200 MG tablet Take 2 tablets (400 mg total) by mouth 2 (two) times daily. 05/28/20   Russella Dar, NP  carbamazepine (TEGRETOL) 200 MG tablet Take 2 tablets (400 mg total) by mouth every evening. 05/28/20   Russella Dar, NP  citalopram (CELEXA) 40 MG tablet Take 1 tablet (40 mg total) by mouth daily. 05/28/20   Russella Dar, NP  Ensure Plus (ENSURE PLUS) LIQD Take 1 Can by mouth 2 (two) times daily between meals. Patient not taking: No sig reported 11/18/19   [name], [name], DO  gabapentin (NEURONTIN) 400 MG capsule Take 1 capsule (400 mg total) by mouth daily after lunch. 06/10/20   Zannie Cove, MD  LORazepam (ATIVAN)  0.5 MG tablet Take 1 tablet (0.5 mg total) by mouth 2 (two) times daily. 05/28/20   Russella Dar, NP  QUEtiapine (SEROQUEL) 100 MG tablet Take 1 tablet (100 mg total) by mouth 2 (two) times daily. 05/28/20   Russella Dar, NP  QUEtiapine (SEROQUEL) 50 MG tablet Take 3 tablets (150 mg total) by mouth at bedtime. 05/28/20   Russella Dar, NP    Allergies    Hydrocodone-acetaminophen and Donepezil  Review of Systems   Review of Systems  Unable to perform ROS: Dementia    Physical Exam Updated Vital Signs SpO2 98%   Physical Exam Vitals and nursing note reviewed.  Constitutional:      Appearance: She is ill-appearing.  HENT:     Head: Normocephalic.     Comments: ?mild swelling left eyebrow    Right Ear: External ear normal.     Left Ear: External ear normal.     Nose: Nose normal.     Mouth/Throat:     Pharynx: Oropharynx is clear.  Eyes:     Pupils: Pupils are equal, round, and reactive to light.  Cardiovascular:     Rate and Rhythm: Normal rate.     Pulses: Normal pulses.  Pulmonary:     Effort: Pulmonary effort is normal.  Abdominal:     Palpations: Abdomen is soft.  Musculoskeletal:     Comments: Diffuse contractures noted.  No obvious signs of trauma, contusion, or deformity on extremities, pelvis, thoracic, cervical, or lumbar spine.     ED Results / Procedures / Treatments   Labs (all labs ordered are listed, but only abnormal results are displayed) Labs Reviewed - No data to display  EKG None  Radiology CT Head Wo Contrast  Result Date: 06/17/2020 CLINICAL DATA:  Larey Seat, altered level of consciousness EXAM: CT HEAD WITHOUT CONTRAST TECHNIQUE: Contiguous axial images were obtained from the base of the skull through the vertex without intravenous contrast. COMPARISON:  06/12/2020 FINDINGS: Brain: No acute infarct or hemorrhage. Lateral ventricles and midline structures are stable. No acute extra-axial fluid collections. No mass effect. Vascular: No  hyperdense vessel or unexpected calcification. Skull: Normal. Negative for fracture or focal lesion. Sinuses/Orbits: No acute finding. Other: None. IMPRESSION: 1. Stable head CT, no acute intracranial process. Electronically Signed   By: Sharlet Salina M.D.   On: 06/17/2020 16:14    Procedures Procedures   Medications Ordered in ED Medications  LORazepam (ATIVAN) injection 1 mg (has no administration in time range)    ED Course  I have reviewed the triage vital signs and the nursing notes.  Pertinent labs & imaging results that were available during my care of the patient were reviewed by me and  considered in my medical decision making (see chart for details).    MDM Rules/Calculators/A&P                          Patient with history of dementia presents today from facility with report that she had a fall out of bed.  They noted a contusion to her head.  There is a possible area of contusion by her left eyebrow but difficult for me to tell if this is normal for her or not.  Patient is fairly contractured and agitated, but no obvious signs of fracture or abnormality are noted on her exam.  Plan head CT discharged back to facility if no acute abnormalities Discussed with Dr. Fredderick Phenix who will follow up with ct results  Final Clinical Impression(s) / ED Diagnoses Final diagnoses:  Fall, initial encounter  Contusion of face, initial encounter    Rx / DC Orders ED Discharge Orders    None       Margarita Grizzle, MD 06/17/20 1550    Margarita Grizzle, MD 06/17/20 [phone]

## 2020-06-17 NOTE — ED Notes (Signed)
Pt discharged and transferred to a stretcher by ambulance crew and transported back to facility. Report was called to the facility by previous RN.

## 2020-06-17 NOTE — ED Triage Notes (Signed)
Pt arrived to ED via EMS from [facility]. Pt was witnessed fall out of bed onto protective mat . Fall was 6 inches to mat , did hit head, no loc, small hematoma to left forehead. Pt is at her baseline  Alert, but disoriented and yells with hx of dementia . Pt is uncooperative and unable to obtain vital signs

## 2020-06-17 NOTE — ED Notes (Signed)
Pt currently has stopped yelling and was able to obtain some vitals

## 2020-06-18 DIAGNOSIS — F02818 Dementia in other diseases classified elsewhere, unspecified severity, with other behavioral disturbance: Secondary | ICD-10-CM | POA: Insufficient documentation

## 2020-06-18 DIAGNOSIS — F0281 Dementia in other diseases classified elsewhere with behavioral disturbance: Secondary | ICD-10-CM | POA: Insufficient documentation

## 2020-07-05 DIAGNOSIS — L89202 Pressure ulcer of unspecified hip, stage 2: Secondary | ICD-10-CM | POA: Diagnosis not present

## 2020-07-05 DIAGNOSIS — G300 Alzheimer's disease with early onset: Secondary | ICD-10-CM | POA: Diagnosis not present

## 2020-07-05 DIAGNOSIS — E440 Moderate protein-calorie malnutrition: Secondary | ICD-10-CM | POA: Diagnosis not present

## 2020-07-14 DIAGNOSIS — R296 Repeated falls: Secondary | ICD-10-CM | POA: Diagnosis not present

## 2020-07-14 DIAGNOSIS — E440 Moderate protein-calorie malnutrition: Secondary | ICD-10-CM | POA: Diagnosis not present

## 2020-07-14 DIAGNOSIS — G300 Alzheimer's disease with early onset: Secondary | ICD-10-CM | POA: Diagnosis not present

## 2020-07-14 DIAGNOSIS — L89202 Pressure ulcer of unspecified hip, stage 2: Secondary | ICD-10-CM | POA: Diagnosis not present

## 2020-07-15 DIAGNOSIS — R296 Repeated falls: Secondary | ICD-10-CM | POA: Diagnosis not present

## 2020-07-15 DIAGNOSIS — E440 Moderate protein-calorie malnutrition: Secondary | ICD-10-CM | POA: Diagnosis not present

## 2020-07-15 DIAGNOSIS — G300 Alzheimer's disease with early onset: Secondary | ICD-10-CM | POA: Diagnosis not present

## 2020-07-15 DIAGNOSIS — L89202 Pressure ulcer of unspecified hip, stage 2: Secondary | ICD-10-CM | POA: Diagnosis not present

## 2020-07-21 DIAGNOSIS — L89202 Pressure ulcer of unspecified hip, stage 2: Secondary | ICD-10-CM | POA: Diagnosis not present

## 2020-07-21 DIAGNOSIS — E440 Moderate protein-calorie malnutrition: Secondary | ICD-10-CM | POA: Diagnosis not present

## 2020-07-21 DIAGNOSIS — G300 Alzheimer's disease with early onset: Secondary | ICD-10-CM | POA: Diagnosis not present

## 2020-08-02 DIAGNOSIS — G300 Alzheimer's disease with early onset: Secondary | ICD-10-CM | POA: Diagnosis not present

## 2020-08-02 DIAGNOSIS — G4701 Insomnia due to medical condition: Secondary | ICD-10-CM | POA: Diagnosis not present

## 2020-08-18 DIAGNOSIS — L89202 Pressure ulcer of unspecified hip, stage 2: Secondary | ICD-10-CM | POA: Diagnosis not present

## 2020-08-18 DIAGNOSIS — E440 Moderate protein-calorie malnutrition: Secondary | ICD-10-CM | POA: Diagnosis not present

## 2020-08-18 DIAGNOSIS — G300 Alzheimer's disease with early onset: Secondary | ICD-10-CM | POA: Diagnosis not present

## 2020-08-19 DIAGNOSIS — L89202 Pressure ulcer of unspecified hip, stage 2: Secondary | ICD-10-CM | POA: Diagnosis not present

## 2020-08-19 DIAGNOSIS — E440 Moderate protein-calorie malnutrition: Secondary | ICD-10-CM | POA: Diagnosis not present

## 2020-08-19 DIAGNOSIS — G300 Alzheimer's disease with early onset: Secondary | ICD-10-CM | POA: Diagnosis not present

## 2020-08-27 DIAGNOSIS — U071 COVID-19: Secondary | ICD-10-CM | POA: Diagnosis not present

## 2020-08-28 DIAGNOSIS — I10 Essential (primary) hypertension: Secondary | ICD-10-CM | POA: Diagnosis not present

## 2020-08-30 DIAGNOSIS — G4701 Insomnia due to medical condition: Secondary | ICD-10-CM | POA: Diagnosis not present

## 2020-08-30 DIAGNOSIS — G300 Alzheimer's disease with early onset: Secondary | ICD-10-CM | POA: Diagnosis not present

## 2020-09-01 DIAGNOSIS — U071 COVID-19: Secondary | ICD-10-CM | POA: Diagnosis not present

## 2020-09-15 DIAGNOSIS — L89202 Pressure ulcer of unspecified hip, stage 2: Secondary | ICD-10-CM | POA: Diagnosis not present

## 2020-09-15 DIAGNOSIS — G300 Alzheimer's disease with early onset: Secondary | ICD-10-CM | POA: Diagnosis not present

## 2020-09-15 DIAGNOSIS — E440 Moderate protein-calorie malnutrition: Secondary | ICD-10-CM | POA: Diagnosis not present

## 2020-09-16 DIAGNOSIS — U071 COVID-19: Secondary | ICD-10-CM | POA: Diagnosis not present

## 2020-09-16 DIAGNOSIS — G300 Alzheimer's disease with early onset: Secondary | ICD-10-CM | POA: Diagnosis not present

## 2020-09-16 DIAGNOSIS — E440 Moderate protein-calorie malnutrition: Secondary | ICD-10-CM | POA: Diagnosis not present

## 2020-09-27 DIAGNOSIS — G4701 Insomnia due to medical condition: Secondary | ICD-10-CM | POA: Diagnosis not present

## 2020-09-27 DIAGNOSIS — G300 Alzheimer's disease with early onset: Secondary | ICD-10-CM | POA: Diagnosis not present

## 2020-10-14 DIAGNOSIS — E440 Moderate protein-calorie malnutrition: Secondary | ICD-10-CM | POA: Diagnosis not present

## 2020-10-14 DIAGNOSIS — R296 Repeated falls: Secondary | ICD-10-CM | POA: Diagnosis not present

## 2020-10-14 DIAGNOSIS — G300 Alzheimer's disease with early onset: Secondary | ICD-10-CM | POA: Diagnosis not present

## 2020-10-15 DIAGNOSIS — L89202 Pressure ulcer of unspecified hip, stage 2: Secondary | ICD-10-CM | POA: Diagnosis not present

## 2020-10-15 DIAGNOSIS — E440 Moderate protein-calorie malnutrition: Secondary | ICD-10-CM | POA: Diagnosis not present

## 2020-11-01 DIAGNOSIS — G300 Alzheimer's disease with early onset: Secondary | ICD-10-CM | POA: Diagnosis not present

## 2020-11-01 DIAGNOSIS — G4701 Insomnia due to medical condition: Secondary | ICD-10-CM | POA: Diagnosis not present

## 2020-11-08 DIAGNOSIS — G300 Alzheimer's disease with early onset: Secondary | ICD-10-CM | POA: Diagnosis not present

## 2020-11-11 DIAGNOSIS — L89202 Pressure ulcer of unspecified hip, stage 2: Secondary | ICD-10-CM | POA: Diagnosis not present

## 2020-11-11 DIAGNOSIS — G300 Alzheimer's disease with early onset: Secondary | ICD-10-CM | POA: Diagnosis not present

## 2020-11-11 DIAGNOSIS — E440 Moderate protein-calorie malnutrition: Secondary | ICD-10-CM | POA: Diagnosis not present

## 2020-11-12 DIAGNOSIS — R296 Repeated falls: Secondary | ICD-10-CM | POA: Diagnosis not present

## 2020-11-12 DIAGNOSIS — L89202 Pressure ulcer of unspecified hip, stage 2: Secondary | ICD-10-CM | POA: Diagnosis not present

## 2020-11-12 DIAGNOSIS — G300 Alzheimer's disease with early onset: Secondary | ICD-10-CM | POA: Diagnosis not present

## 2020-11-12 DIAGNOSIS — E440 Moderate protein-calorie malnutrition: Secondary | ICD-10-CM | POA: Diagnosis not present

## 2020-11-21 DIAGNOSIS — G4701 Insomnia due to medical condition: Secondary | ICD-10-CM | POA: Diagnosis not present

## 2020-11-21 DIAGNOSIS — F02C2 Dementia in other diseases classified elsewhere, severe, with psychotic disturbance: Secondary | ICD-10-CM | POA: Diagnosis not present

## 2020-11-21 DIAGNOSIS — G300 Alzheimer's disease with early onset: Secondary | ICD-10-CM | POA: Diagnosis not present

## 2020-11-23 DIAGNOSIS — G300 Alzheimer's disease with early onset: Secondary | ICD-10-CM | POA: Diagnosis not present

## 2020-12-06 DIAGNOSIS — F02C2 Dementia in other diseases classified elsewhere, severe, with psychotic disturbance: Secondary | ICD-10-CM | POA: Diagnosis not present

## 2020-12-06 DIAGNOSIS — G300 Alzheimer's disease with early onset: Secondary | ICD-10-CM | POA: Diagnosis not present

## 2020-12-10 DIAGNOSIS — Z79899 Other long term (current) drug therapy: Secondary | ICD-10-CM | POA: Diagnosis not present

## 2020-12-10 DIAGNOSIS — E440 Moderate protein-calorie malnutrition: Secondary | ICD-10-CM | POA: Diagnosis not present

## 2020-12-10 DIAGNOSIS — G300 Alzheimer's disease with early onset: Secondary | ICD-10-CM | POA: Diagnosis not present

## 2020-12-16 DIAGNOSIS — E440 Moderate protein-calorie malnutrition: Secondary | ICD-10-CM | POA: Diagnosis not present

## 2020-12-16 DIAGNOSIS — G300 Alzheimer's disease with early onset: Secondary | ICD-10-CM | POA: Diagnosis not present

## 2020-12-16 DIAGNOSIS — Z79899 Other long term (current) drug therapy: Secondary | ICD-10-CM | POA: Diagnosis not present

## 2021-02-14 DIAGNOSIS — F02C2 Dementia in other diseases classified elsewhere, severe, with psychotic disturbance: Secondary | ICD-10-CM | POA: Diagnosis not present

## 2021-02-14 DIAGNOSIS — G4701 Insomnia due to medical condition: Secondary | ICD-10-CM | POA: Diagnosis not present

## 2021-02-14 DIAGNOSIS — G300 Alzheimer's disease with early onset: Secondary | ICD-10-CM | POA: Diagnosis not present

## 2021-02-21 DIAGNOSIS — G300 Alzheimer's disease with early onset: Secondary | ICD-10-CM | POA: Diagnosis not present

## 2021-02-21 DIAGNOSIS — F02C2 Dementia in other diseases classified elsewhere, severe, with psychotic disturbance: Secondary | ICD-10-CM | POA: Diagnosis not present

## 2021-02-21 DIAGNOSIS — G4701 Insomnia due to medical condition: Secondary | ICD-10-CM | POA: Diagnosis not present

## 2021-02-28 DIAGNOSIS — E440 Moderate protein-calorie malnutrition: Secondary | ICD-10-CM | POA: Diagnosis not present

## 2021-02-28 DIAGNOSIS — G300 Alzheimer's disease with early onset: Secondary | ICD-10-CM | POA: Diagnosis not present

## 2021-02-28 DIAGNOSIS — R5381 Other malaise: Secondary | ICD-10-CM | POA: Diagnosis not present

## 2021-02-28 DIAGNOSIS — I959 Hypotension, unspecified: Secondary | ICD-10-CM | POA: Diagnosis not present

## 2021-02-28 DIAGNOSIS — R451 Restlessness and agitation: Secondary | ICD-10-CM | POA: Diagnosis not present

## 2021-03-10 DIAGNOSIS — R451 Restlessness and agitation: Secondary | ICD-10-CM | POA: Diagnosis not present

## 2021-03-10 DIAGNOSIS — R5381 Other malaise: Secondary | ICD-10-CM | POA: Diagnosis not present

## 2021-03-10 DIAGNOSIS — G300 Alzheimer's disease with early onset: Secondary | ICD-10-CM | POA: Diagnosis not present

## 2021-03-10 DIAGNOSIS — E440 Moderate protein-calorie malnutrition: Secondary | ICD-10-CM | POA: Diagnosis not present

## 2021-03-14 DIAGNOSIS — F02C2 Dementia in other diseases classified elsewhere, severe, with psychotic disturbance: Secondary | ICD-10-CM | POA: Diagnosis not present

## 2021-03-14 DIAGNOSIS — G300 Alzheimer's disease with early onset: Secondary | ICD-10-CM | POA: Diagnosis not present

## 2021-03-14 DIAGNOSIS — G4701 Insomnia due to medical condition: Secondary | ICD-10-CM | POA: Diagnosis not present

## 2021-03-31 DIAGNOSIS — G300 Alzheimer's disease with early onset: Secondary | ICD-10-CM | POA: Diagnosis not present

## 2021-04-04 DIAGNOSIS — G300 Alzheimer's disease with early onset: Secondary | ICD-10-CM | POA: Diagnosis not present

## 2021-04-04 DIAGNOSIS — E440 Moderate protein-calorie malnutrition: Secondary | ICD-10-CM | POA: Diagnosis not present

## 2021-04-04 DIAGNOSIS — R5381 Other malaise: Secondary | ICD-10-CM | POA: Diagnosis not present

## 2021-04-04 DIAGNOSIS — G8929 Other chronic pain: Secondary | ICD-10-CM | POA: Diagnosis not present

## 2021-04-04 DIAGNOSIS — L89202 Pressure ulcer of unspecified hip, stage 2: Secondary | ICD-10-CM | POA: Diagnosis not present

## 2021-04-04 DIAGNOSIS — I959 Hypotension, unspecified: Secondary | ICD-10-CM | POA: Diagnosis not present

## 2021-04-11 DIAGNOSIS — G4701 Insomnia due to medical condition: Secondary | ICD-10-CM | POA: Diagnosis not present

## 2021-04-11 DIAGNOSIS — F02C2 Dementia in other diseases classified elsewhere, severe, with psychotic disturbance: Secondary | ICD-10-CM | POA: Diagnosis not present

## 2021-04-11 DIAGNOSIS — G300 Alzheimer's disease with early onset: Secondary | ICD-10-CM | POA: Diagnosis not present

## 2021-04-13 DIAGNOSIS — D538 Other specified nutritional anemias: Secondary | ICD-10-CM | POA: Diagnosis not present

## 2021-04-13 DIAGNOSIS — E538 Deficiency of other specified B group vitamins: Secondary | ICD-10-CM | POA: Diagnosis not present

## 2021-04-13 DIAGNOSIS — E559 Vitamin D deficiency, unspecified: Secondary | ICD-10-CM | POA: Diagnosis not present

## 2021-04-13 DIAGNOSIS — E119 Type 2 diabetes mellitus without complications: Secondary | ICD-10-CM | POA: Diagnosis not present

## 2021-04-14 DIAGNOSIS — R5381 Other malaise: Secondary | ICD-10-CM | POA: Diagnosis not present

## 2021-04-14 DIAGNOSIS — L89202 Pressure ulcer of unspecified hip, stage 2: Secondary | ICD-10-CM | POA: Diagnosis not present

## 2021-04-14 DIAGNOSIS — E440 Moderate protein-calorie malnutrition: Secondary | ICD-10-CM | POA: Diagnosis not present

## 2021-04-14 DIAGNOSIS — G300 Alzheimer's disease with early onset: Secondary | ICD-10-CM | POA: Diagnosis not present

## 2021-04-14 DIAGNOSIS — I959 Hypotension, unspecified: Secondary | ICD-10-CM | POA: Diagnosis not present

## 2021-04-14 DIAGNOSIS — G8929 Other chronic pain: Secondary | ICD-10-CM | POA: Diagnosis not present

## 2021-05-02 DIAGNOSIS — E440 Moderate protein-calorie malnutrition: Secondary | ICD-10-CM | POA: Diagnosis not present

## 2021-05-02 DIAGNOSIS — R5381 Other malaise: Secondary | ICD-10-CM | POA: Diagnosis not present

## 2021-05-02 DIAGNOSIS — G629 Polyneuropathy, unspecified: Secondary | ICD-10-CM | POA: Diagnosis not present

## 2021-05-02 DIAGNOSIS — I959 Hypotension, unspecified: Secondary | ICD-10-CM | POA: Diagnosis not present

## 2021-05-02 DIAGNOSIS — G300 Alzheimer's disease with early onset: Secondary | ICD-10-CM | POA: Diagnosis not present

## 2021-05-02 DIAGNOSIS — R451 Restlessness and agitation: Secondary | ICD-10-CM | POA: Diagnosis not present

## 2021-05-05 DIAGNOSIS — G300 Alzheimer's disease with early onset: Secondary | ICD-10-CM | POA: Diagnosis not present

## 2021-05-09 DIAGNOSIS — F02C2 Dementia in other diseases classified elsewhere, severe, with psychotic disturbance: Secondary | ICD-10-CM | POA: Diagnosis not present

## 2021-05-09 DIAGNOSIS — G300 Alzheimer's disease with early onset: Secondary | ICD-10-CM | POA: Diagnosis not present

## 2021-05-09 DIAGNOSIS — G4701 Insomnia due to medical condition: Secondary | ICD-10-CM | POA: Diagnosis not present

## 2021-05-12 DIAGNOSIS — R451 Restlessness and agitation: Secondary | ICD-10-CM | POA: Diagnosis not present

## 2021-05-12 DIAGNOSIS — R5381 Other malaise: Secondary | ICD-10-CM | POA: Diagnosis not present

## 2021-05-12 DIAGNOSIS — G300 Alzheimer's disease with early onset: Secondary | ICD-10-CM | POA: Diagnosis not present

## 2021-05-12 DIAGNOSIS — I959 Hypotension, unspecified: Secondary | ICD-10-CM | POA: Diagnosis not present

## 2021-05-12 DIAGNOSIS — G629 Polyneuropathy, unspecified: Secondary | ICD-10-CM | POA: Diagnosis not present

## 2021-05-30 DIAGNOSIS — G629 Polyneuropathy, unspecified: Secondary | ICD-10-CM | POA: Diagnosis not present

## 2021-05-30 DIAGNOSIS — E440 Moderate protein-calorie malnutrition: Secondary | ICD-10-CM | POA: Diagnosis not present

## 2021-05-30 DIAGNOSIS — R451 Restlessness and agitation: Secondary | ICD-10-CM | POA: Diagnosis not present

## 2021-05-30 DIAGNOSIS — R5381 Other malaise: Secondary | ICD-10-CM | POA: Diagnosis not present

## 2021-05-30 DIAGNOSIS — I959 Hypotension, unspecified: Secondary | ICD-10-CM | POA: Diagnosis not present

## 2021-05-30 DIAGNOSIS — R296 Repeated falls: Secondary | ICD-10-CM | POA: Diagnosis not present

## 2021-05-30 DIAGNOSIS — G8929 Other chronic pain: Secondary | ICD-10-CM | POA: Diagnosis not present

## 2021-05-30 DIAGNOSIS — L89202 Pressure ulcer of unspecified hip, stage 2: Secondary | ICD-10-CM | POA: Diagnosis not present

## 2021-05-31 DIAGNOSIS — J4520 Mild intermittent asthma, uncomplicated: Secondary | ICD-10-CM | POA: Diagnosis not present

## 2021-06-02 DIAGNOSIS — G300 Alzheimer's disease with early onset: Secondary | ICD-10-CM | POA: Diagnosis not present

## 2021-06-06 DIAGNOSIS — G309 Alzheimer's disease, unspecified: Secondary | ICD-10-CM | POA: Diagnosis not present

## 2021-06-06 DIAGNOSIS — G4701 Insomnia due to medical condition: Secondary | ICD-10-CM | POA: Diagnosis not present

## 2021-06-06 DIAGNOSIS — F02C2 Dementia in other diseases classified elsewhere, severe, with psychotic disturbance: Secondary | ICD-10-CM | POA: Diagnosis not present

## 2021-06-06 DIAGNOSIS — G300 Alzheimer's disease with early onset: Secondary | ICD-10-CM | POA: Diagnosis not present

## 2021-06-27 DIAGNOSIS — G300 Alzheimer's disease with early onset: Secondary | ICD-10-CM | POA: Diagnosis not present

## 2021-06-27 DIAGNOSIS — G4701 Insomnia due to medical condition: Secondary | ICD-10-CM | POA: Diagnosis not present

## 2021-06-27 DIAGNOSIS — F02C2 Dementia in other diseases classified elsewhere, severe, with psychotic disturbance: Secondary | ICD-10-CM | POA: Diagnosis not present

## 2021-06-28 DIAGNOSIS — G300 Alzheimer's disease with early onset: Secondary | ICD-10-CM | POA: Diagnosis not present

## 2021-06-28 DIAGNOSIS — I959 Hypotension, unspecified: Secondary | ICD-10-CM | POA: Diagnosis not present

## 2021-06-29 DIAGNOSIS — G309 Alzheimer's disease, unspecified: Secondary | ICD-10-CM | POA: Diagnosis not present

## 2021-06-29 DIAGNOSIS — F03B3 Unspecified dementia, moderate, with mood disturbance: Secondary | ICD-10-CM | POA: Diagnosis not present

## 2021-07-05 DIAGNOSIS — L603 Nail dystrophy: Secondary | ICD-10-CM | POA: Diagnosis not present

## 2021-07-05 DIAGNOSIS — M958 Other specified acquired deformities of musculoskeletal system: Secondary | ICD-10-CM | POA: Diagnosis not present

## 2021-07-05 DIAGNOSIS — I739 Peripheral vascular disease, unspecified: Secondary | ICD-10-CM | POA: Diagnosis not present

## 2021-07-07 DIAGNOSIS — R5381 Other malaise: Secondary | ICD-10-CM | POA: Diagnosis not present

## 2021-07-07 DIAGNOSIS — G300 Alzheimer's disease with early onset: Secondary | ICD-10-CM | POA: Diagnosis not present

## 2021-07-15 ENCOUNTER — Telehealth: Payer: Self-pay | Admitting: *Deleted

## 2021-07-15 DIAGNOSIS — F0280 Dementia in other diseases classified elsewhere without behavioral disturbance: Secondary | ICD-10-CM

## 2021-07-15 DIAGNOSIS — E440 Moderate protein-calorie malnutrition: Secondary | ICD-10-CM

## 2021-07-15 DIAGNOSIS — F02818 Dementia in other diseases classified elsewhere, unspecified severity, with other behavioral disturbance: Secondary | ICD-10-CM

## 2021-07-15 DIAGNOSIS — F03918 Unspecified dementia, unspecified severity, with other behavioral disturbance: Secondary | ICD-10-CM

## 2021-07-15 DIAGNOSIS — Z789 Other specified health status: Secondary | ICD-10-CM

## 2021-07-15 NOTE — Telephone Encounter (Signed)
Pt daughter calling in wanting a rx for a new wheelchair for her mom. She hasnt had luck with reaching the nurse at the facility. Was hoping that we could her with this matter because its been a challenging time. Please give her a call at [phone]. Please advise. Kimani Hovis Bruna Potter, CMA

## 2021-07-16 NOTE — Addendum Note (Signed)
Addended by: Valetta Close on: 07/16/2021 04:17 PM   Modules accepted: Orders

## 2021-07-16 NOTE — Telephone Encounter (Signed)
Covering for PCP Dr. Salvadore Dom. Will send script for DME wheelchair.   Fayette Pho, MD

## 2021-07-19 NOTE — Telephone Encounter (Signed)
Two copied of wheelchair order printed. One to mail, one placed in front desk filing cabinet for pick up if needed.   Ezequiel Essex, MD

## 2021-07-19 NOTE — Addendum Note (Signed)
Addended by: Valetta Close on: 07/19/2021 02:02 PM   Modules accepted: Orders

## 2021-07-22 ENCOUNTER — Telehealth: Payer: Self-pay | Admitting: Family Medicine

## 2021-07-22 NOTE — Telephone Encounter (Signed)
Notified by [place] that patient had not yet received wheelchair order. Previous phone note states Dr. Larita Fife placed form for patient to pick up. Attempted to call daughter of patient but no answer. Left voicemail message to call office back to see if form still needs to be picked up.   [name], DO 07/22/2021, 5:21 PM PGY-3, [facility]

## 2021-07-26 DIAGNOSIS — E440 Moderate protein-calorie malnutrition: Secondary | ICD-10-CM | POA: Diagnosis not present

## 2021-07-26 DIAGNOSIS — G300 Alzheimer's disease with early onset: Secondary | ICD-10-CM | POA: Diagnosis not present

## 2021-07-28 DIAGNOSIS — G300 Alzheimer's disease with early onset: Secondary | ICD-10-CM | POA: Diagnosis not present

## 2021-08-01 DIAGNOSIS — F03B3 Unspecified dementia, moderate, with mood disturbance: Secondary | ICD-10-CM | POA: Diagnosis not present

## 2021-08-04 DIAGNOSIS — G629 Polyneuropathy, unspecified: Secondary | ICD-10-CM | POA: Diagnosis not present

## 2021-08-04 DIAGNOSIS — E440 Moderate protein-calorie malnutrition: Secondary | ICD-10-CM | POA: Diagnosis not present

## 2021-08-04 DIAGNOSIS — G300 Alzheimer's disease with early onset: Secondary | ICD-10-CM | POA: Diagnosis not present

## 2021-08-08 DIAGNOSIS — G4701 Insomnia due to medical condition: Secondary | ICD-10-CM | POA: Diagnosis not present

## 2021-08-08 DIAGNOSIS — F02C2 Dementia in other diseases classified elsewhere, severe, with psychotic disturbance: Secondary | ICD-10-CM | POA: Diagnosis not present

## 2021-08-08 DIAGNOSIS — G300 Alzheimer's disease with early onset: Secondary | ICD-10-CM | POA: Diagnosis not present

## 2021-08-27 DIAGNOSIS — G300 Alzheimer's disease with early onset: Secondary | ICD-10-CM | POA: Diagnosis not present

## 2021-09-01 DIAGNOSIS — G629 Polyneuropathy, unspecified: Secondary | ICD-10-CM | POA: Diagnosis not present

## 2021-09-01 DIAGNOSIS — F03B3 Unspecified dementia, moderate, with mood disturbance: Secondary | ICD-10-CM | POA: Diagnosis not present

## 2021-09-01 DIAGNOSIS — E440 Moderate protein-calorie malnutrition: Secondary | ICD-10-CM | POA: Diagnosis not present

## 2021-09-12 DIAGNOSIS — G300 Alzheimer's disease with early onset: Secondary | ICD-10-CM | POA: Diagnosis not present

## 2021-09-12 DIAGNOSIS — G4701 Insomnia due to medical condition: Secondary | ICD-10-CM | POA: Diagnosis not present

## 2021-09-12 DIAGNOSIS — F02C2 Dementia in other diseases classified elsewhere, severe, with psychotic disturbance: Secondary | ICD-10-CM | POA: Diagnosis not present

## 2021-09-22 DIAGNOSIS — B351 Tinea unguium: Secondary | ICD-10-CM | POA: Diagnosis not present

## 2021-09-22 DIAGNOSIS — I739 Peripheral vascular disease, unspecified: Secondary | ICD-10-CM | POA: Diagnosis not present

## 2021-09-23 DIAGNOSIS — E785 Hyperlipidemia, unspecified: Secondary | ICD-10-CM | POA: Diagnosis not present

## 2021-09-23 DIAGNOSIS — F03B3 Unspecified dementia, moderate, with mood disturbance: Secondary | ICD-10-CM | POA: Diagnosis not present

## 2021-09-23 DIAGNOSIS — I10 Essential (primary) hypertension: Secondary | ICD-10-CM | POA: Diagnosis not present

## 2021-09-23 DIAGNOSIS — G300 Alzheimer's disease with early onset: Secondary | ICD-10-CM | POA: Diagnosis not present

## 2021-09-23 DIAGNOSIS — E440 Moderate protein-calorie malnutrition: Secondary | ICD-10-CM | POA: Diagnosis not present

## 2021-09-23 DIAGNOSIS — J45909 Unspecified asthma, uncomplicated: Secondary | ICD-10-CM | POA: Diagnosis not present

## 2021-10-07 DIAGNOSIS — L209 Atopic dermatitis, unspecified: Secondary | ICD-10-CM | POA: Diagnosis not present

## 2021-10-07 DIAGNOSIS — L853 Xerosis cutis: Secondary | ICD-10-CM | POA: Diagnosis not present

## 2021-10-07 DIAGNOSIS — L981 Factitial dermatitis: Secondary | ICD-10-CM | POA: Diagnosis not present

## 2021-10-08 DIAGNOSIS — G300 Alzheimer's disease with early onset: Secondary | ICD-10-CM | POA: Diagnosis not present

## 2021-10-08 DIAGNOSIS — E782 Mixed hyperlipidemia: Secondary | ICD-10-CM | POA: Diagnosis not present

## 2021-10-08 DIAGNOSIS — I10 Essential (primary) hypertension: Secondary | ICD-10-CM | POA: Diagnosis not present

## 2021-10-08 DIAGNOSIS — J45909 Unspecified asthma, uncomplicated: Secondary | ICD-10-CM | POA: Diagnosis not present

## 2021-10-10 DIAGNOSIS — G4701 Insomnia due to medical condition: Secondary | ICD-10-CM | POA: Diagnosis not present

## 2021-10-10 DIAGNOSIS — F02C2 Dementia in other diseases classified elsewhere, severe, with psychotic disturbance: Secondary | ICD-10-CM | POA: Diagnosis not present

## 2021-10-10 DIAGNOSIS — G300 Alzheimer's disease with early onset: Secondary | ICD-10-CM | POA: Diagnosis not present

## 2021-10-13 DIAGNOSIS — E785 Hyperlipidemia, unspecified: Secondary | ICD-10-CM | POA: Diagnosis not present

## 2021-10-13 DIAGNOSIS — G300 Alzheimer's disease with early onset: Secondary | ICD-10-CM | POA: Diagnosis not present

## 2021-10-13 DIAGNOSIS — I10 Essential (primary) hypertension: Secondary | ICD-10-CM | POA: Diagnosis not present

## 2021-10-13 DIAGNOSIS — J45909 Unspecified asthma, uncomplicated: Secondary | ICD-10-CM | POA: Diagnosis not present

## 2021-10-13 DIAGNOSIS — E440 Moderate protein-calorie malnutrition: Secondary | ICD-10-CM | POA: Diagnosis not present

## 2021-10-21 DIAGNOSIS — F03B3 Unspecified dementia, moderate, with mood disturbance: Secondary | ICD-10-CM | POA: Diagnosis not present

## 2021-10-24 DIAGNOSIS — E440 Moderate protein-calorie malnutrition: Secondary | ICD-10-CM | POA: Diagnosis not present

## 2021-10-24 DIAGNOSIS — J45909 Unspecified asthma, uncomplicated: Secondary | ICD-10-CM | POA: Diagnosis not present

## 2021-10-24 DIAGNOSIS — I10 Essential (primary) hypertension: Secondary | ICD-10-CM | POA: Diagnosis not present

## 2021-10-24 DIAGNOSIS — G8929 Other chronic pain: Secondary | ICD-10-CM | POA: Diagnosis not present

## 2021-10-24 DIAGNOSIS — G300 Alzheimer's disease with early onset: Secondary | ICD-10-CM | POA: Diagnosis not present

## 2021-11-01 DIAGNOSIS — E782 Mixed hyperlipidemia: Secondary | ICD-10-CM | POA: Diagnosis not present

## 2021-11-01 DIAGNOSIS — G300 Alzheimer's disease with early onset: Secondary | ICD-10-CM | POA: Diagnosis not present

## 2021-11-01 DIAGNOSIS — I10 Essential (primary) hypertension: Secondary | ICD-10-CM | POA: Diagnosis not present

## 2021-11-01 DIAGNOSIS — J45909 Unspecified asthma, uncomplicated: Secondary | ICD-10-CM | POA: Diagnosis not present

## 2021-11-07 DIAGNOSIS — F02C2 Dementia in other diseases classified elsewhere, severe, with psychotic disturbance: Secondary | ICD-10-CM | POA: Diagnosis not present

## 2021-11-07 DIAGNOSIS — G300 Alzheimer's disease with early onset: Secondary | ICD-10-CM | POA: Diagnosis not present

## 2021-11-07 DIAGNOSIS — G4701 Insomnia due to medical condition: Secondary | ICD-10-CM | POA: Diagnosis not present

## 2021-11-17 DIAGNOSIS — E785 Hyperlipidemia, unspecified: Secondary | ICD-10-CM | POA: Diagnosis not present

## 2021-11-17 DIAGNOSIS — I10 Essential (primary) hypertension: Secondary | ICD-10-CM | POA: Diagnosis not present

## 2021-11-17 DIAGNOSIS — J45909 Unspecified asthma, uncomplicated: Secondary | ICD-10-CM | POA: Diagnosis not present

## 2021-11-17 DIAGNOSIS — G8929 Other chronic pain: Secondary | ICD-10-CM | POA: Diagnosis not present

## 2021-11-17 DIAGNOSIS — R5381 Other malaise: Secondary | ICD-10-CM | POA: Diagnosis not present

## 2021-11-17 DIAGNOSIS — E440 Moderate protein-calorie malnutrition: Secondary | ICD-10-CM | POA: Diagnosis not present

## 2021-11-17 DIAGNOSIS — F03B3 Unspecified dementia, moderate, with mood disturbance: Secondary | ICD-10-CM | POA: Diagnosis not present

## 2021-11-28 DIAGNOSIS — E785 Hyperlipidemia, unspecified: Secondary | ICD-10-CM | POA: Diagnosis not present

## 2021-11-28 DIAGNOSIS — I10 Essential (primary) hypertension: Secondary | ICD-10-CM | POA: Diagnosis not present

## 2021-11-28 DIAGNOSIS — E440 Moderate protein-calorie malnutrition: Secondary | ICD-10-CM | POA: Diagnosis not present

## 2021-11-28 DIAGNOSIS — G309 Alzheimer's disease, unspecified: Secondary | ICD-10-CM | POA: Diagnosis not present

## 2021-11-28 DIAGNOSIS — J45909 Unspecified asthma, uncomplicated: Secondary | ICD-10-CM | POA: Diagnosis not present

## 2021-12-01 DIAGNOSIS — J45909 Unspecified asthma, uncomplicated: Secondary | ICD-10-CM | POA: Diagnosis not present

## 2021-12-01 DIAGNOSIS — I10 Essential (primary) hypertension: Secondary | ICD-10-CM | POA: Diagnosis not present

## 2021-12-01 DIAGNOSIS — E785 Hyperlipidemia, unspecified: Secondary | ICD-10-CM | POA: Diagnosis not present

## 2021-12-05 DIAGNOSIS — I739 Peripheral vascular disease, unspecified: Secondary | ICD-10-CM | POA: Diagnosis not present

## 2021-12-06 DIAGNOSIS — Z23 Encounter for immunization: Secondary | ICD-10-CM | POA: Diagnosis not present

## 2021-12-15 DIAGNOSIS — F03B3 Unspecified dementia, moderate, with mood disturbance: Secondary | ICD-10-CM | POA: Diagnosis not present

## 2021-12-22 DIAGNOSIS — R5381 Other malaise: Secondary | ICD-10-CM | POA: Diagnosis not present

## 2021-12-22 DIAGNOSIS — E785 Hyperlipidemia, unspecified: Secondary | ICD-10-CM | POA: Diagnosis not present

## 2021-12-22 DIAGNOSIS — E440 Moderate protein-calorie malnutrition: Secondary | ICD-10-CM | POA: Diagnosis not present

## 2021-12-22 DIAGNOSIS — I10 Essential (primary) hypertension: Secondary | ICD-10-CM | POA: Diagnosis not present

## 2021-12-22 DIAGNOSIS — J45909 Unspecified asthma, uncomplicated: Secondary | ICD-10-CM | POA: Diagnosis not present

## 2021-12-24 DIAGNOSIS — J45909 Unspecified asthma, uncomplicated: Secondary | ICD-10-CM | POA: Diagnosis not present

## 2021-12-24 DIAGNOSIS — I10 Essential (primary) hypertension: Secondary | ICD-10-CM | POA: Diagnosis not present

## 2021-12-24 DIAGNOSIS — E785 Hyperlipidemia, unspecified: Secondary | ICD-10-CM | POA: Diagnosis not present

## 2021-12-24 DIAGNOSIS — G300 Alzheimer's disease with early onset: Secondary | ICD-10-CM | POA: Diagnosis not present

## 2022-01-02 DIAGNOSIS — F02C2 Dementia in other diseases classified elsewhere, severe, with psychotic disturbance: Secondary | ICD-10-CM | POA: Diagnosis not present

## 2022-01-02 DIAGNOSIS — E440 Moderate protein-calorie malnutrition: Secondary | ICD-10-CM | POA: Diagnosis not present

## 2022-01-02 DIAGNOSIS — G8929 Other chronic pain: Secondary | ICD-10-CM | POA: Diagnosis not present

## 2022-01-02 DIAGNOSIS — G300 Alzheimer's disease with early onset: Secondary | ICD-10-CM | POA: Diagnosis not present

## 2022-01-02 DIAGNOSIS — E785 Hyperlipidemia, unspecified: Secondary | ICD-10-CM | POA: Diagnosis not present

## 2022-01-02 DIAGNOSIS — J45909 Unspecified asthma, uncomplicated: Secondary | ICD-10-CM | POA: Diagnosis not present

## 2022-01-02 DIAGNOSIS — G4701 Insomnia due to medical condition: Secondary | ICD-10-CM | POA: Diagnosis not present

## 2022-01-11 DIAGNOSIS — E119 Type 2 diabetes mellitus without complications: Secondary | ICD-10-CM | POA: Diagnosis not present

## 2022-01-11 DIAGNOSIS — I10 Essential (primary) hypertension: Secondary | ICD-10-CM | POA: Diagnosis not present

## 2022-01-16 DIAGNOSIS — F03B3 Unspecified dementia, moderate, with mood disturbance: Secondary | ICD-10-CM | POA: Diagnosis not present

## 2022-01-19 DIAGNOSIS — I10 Essential (primary) hypertension: Secondary | ICD-10-CM | POA: Diagnosis not present

## 2022-01-19 DIAGNOSIS — E440 Moderate protein-calorie malnutrition: Secondary | ICD-10-CM | POA: Diagnosis not present

## 2022-01-19 DIAGNOSIS — E785 Hyperlipidemia, unspecified: Secondary | ICD-10-CM | POA: Diagnosis not present

## 2022-01-19 DIAGNOSIS — R5381 Other malaise: Secondary | ICD-10-CM | POA: Diagnosis not present

## 2022-01-19 DIAGNOSIS — G300 Alzheimer's disease with early onset: Secondary | ICD-10-CM | POA: Diagnosis not present

## 2022-01-19 DIAGNOSIS — J45909 Unspecified asthma, uncomplicated: Secondary | ICD-10-CM | POA: Diagnosis not present

## 2022-01-19 DIAGNOSIS — G8929 Other chronic pain: Secondary | ICD-10-CM | POA: Diagnosis not present

## 2022-01-25 DIAGNOSIS — G300 Alzheimer's disease with early onset: Secondary | ICD-10-CM | POA: Diagnosis not present

## 2022-01-25 DIAGNOSIS — J45909 Unspecified asthma, uncomplicated: Secondary | ICD-10-CM | POA: Diagnosis not present

## 2022-01-25 DIAGNOSIS — I10 Essential (primary) hypertension: Secondary | ICD-10-CM | POA: Diagnosis not present

## 2022-01-25 DIAGNOSIS — E785 Hyperlipidemia, unspecified: Secondary | ICD-10-CM | POA: Diagnosis not present

## 2022-01-26 DIAGNOSIS — F03B3 Unspecified dementia, moderate, with mood disturbance: Secondary | ICD-10-CM | POA: Diagnosis not present

## 2022-01-30 DIAGNOSIS — G4701 Insomnia due to medical condition: Secondary | ICD-10-CM | POA: Diagnosis not present

## 2022-01-30 DIAGNOSIS — G300 Alzheimer's disease with early onset: Secondary | ICD-10-CM | POA: Diagnosis not present

## 2022-01-30 DIAGNOSIS — F02C2 Dementia in other diseases classified elsewhere, severe, with psychotic disturbance: Secondary | ICD-10-CM | POA: Diagnosis not present

## 2022-02-01 DIAGNOSIS — E440 Moderate protein-calorie malnutrition: Secondary | ICD-10-CM | POA: Diagnosis not present

## 2022-02-01 DIAGNOSIS — I10 Essential (primary) hypertension: Secondary | ICD-10-CM | POA: Diagnosis not present

## 2022-02-01 DIAGNOSIS — G300 Alzheimer's disease with early onset: Secondary | ICD-10-CM | POA: Diagnosis not present

## 2022-02-01 DIAGNOSIS — J45909 Unspecified asthma, uncomplicated: Secondary | ICD-10-CM | POA: Diagnosis not present

## 2022-02-01 DIAGNOSIS — E785 Hyperlipidemia, unspecified: Secondary | ICD-10-CM | POA: Diagnosis not present

## 2022-02-08 DIAGNOSIS — S70211A Abrasion, right hip, initial encounter: Secondary | ICD-10-CM | POA: Diagnosis not present

## 2022-02-08 DIAGNOSIS — M6281 Muscle weakness (generalized): Secondary | ICD-10-CM | POA: Diagnosis not present

## 2022-02-08 DIAGNOSIS — R239 Unspecified skin changes: Secondary | ICD-10-CM | POA: Diagnosis not present

## 2022-02-08 DIAGNOSIS — E441 Mild protein-calorie malnutrition: Secondary | ICD-10-CM | POA: Diagnosis not present

## 2022-02-15 DIAGNOSIS — R627 Adult failure to thrive: Secondary | ICD-10-CM | POA: Diagnosis not present

## 2022-02-15 DIAGNOSIS — E441 Mild protein-calorie malnutrition: Secondary | ICD-10-CM | POA: Diagnosis not present

## 2022-02-15 DIAGNOSIS — M6281 Muscle weakness (generalized): Secondary | ICD-10-CM | POA: Diagnosis not present

## 2022-02-15 DIAGNOSIS — S70211A Abrasion, right hip, initial encounter: Secondary | ICD-10-CM | POA: Diagnosis not present

## 2022-02-15 DIAGNOSIS — R239 Unspecified skin changes: Secondary | ICD-10-CM | POA: Diagnosis not present

## 2022-02-20 DIAGNOSIS — G4700 Insomnia, unspecified: Secondary | ICD-10-CM | POA: Diagnosis not present

## 2022-02-22 ENCOUNTER — Encounter (HOSPITAL_COMMUNITY): Payer: Self-pay | Admitting: Emergency Medicine

## 2022-02-22 ENCOUNTER — Emergency Department (HOSPITAL_COMMUNITY): Payer: 59

## 2022-02-22 ENCOUNTER — Emergency Department (HOSPITAL_COMMUNITY)
Admission: EM | Admit: 2022-02-22 | Discharge: 2022-02-23 | Disposition: A | Discharge status: Home / Self Care | Payer: 59 | Attending: Emergency Medicine | Admitting: Emergency Medicine

## 2022-02-22 DIAGNOSIS — M6281 Muscle weakness (generalized): Secondary | ICD-10-CM | POA: Diagnosis not present

## 2022-02-22 DIAGNOSIS — L89210 Pressure ulcer of right hip, unstageable: Secondary | ICD-10-CM | POA: Diagnosis not present

## 2022-02-22 DIAGNOSIS — Z1152 Encounter for screening for COVID-19: Secondary | ICD-10-CM | POA: Insufficient documentation

## 2022-02-22 DIAGNOSIS — R4701 Aphasia: Secondary | ICD-10-CM | POA: Diagnosis not present

## 2022-02-22 DIAGNOSIS — R239 Unspecified skin changes: Secondary | ICD-10-CM | POA: Diagnosis not present

## 2022-02-22 DIAGNOSIS — F0280 Dementia in other diseases classified elsewhere without behavioral disturbance: Secondary | ICD-10-CM | POA: Diagnosis not present

## 2022-02-22 DIAGNOSIS — R627 Adult failure to thrive: Secondary | ICD-10-CM | POA: Diagnosis not present

## 2022-02-22 DIAGNOSIS — G309 Alzheimer's disease, unspecified: Secondary | ICD-10-CM | POA: Diagnosis not present

## 2022-02-22 DIAGNOSIS — E860 Dehydration: Secondary | ICD-10-CM | POA: Diagnosis not present

## 2022-02-22 DIAGNOSIS — R5381 Other malaise: Secondary | ICD-10-CM | POA: Diagnosis not present

## 2022-02-22 DIAGNOSIS — G300 Alzheimer's disease with early onset: Secondary | ICD-10-CM | POA: Diagnosis not present

## 2022-02-22 DIAGNOSIS — R6889 Other general symptoms and signs: Secondary | ICD-10-CM | POA: Diagnosis not present

## 2022-02-22 DIAGNOSIS — Z743 Need for continuous supervision: Secondary | ICD-10-CM | POA: Diagnosis not present

## 2022-02-22 DIAGNOSIS — E441 Mild protein-calorie malnutrition: Secondary | ICD-10-CM | POA: Diagnosis not present

## 2022-02-22 DIAGNOSIS — I10 Essential (primary) hypertension: Secondary | ICD-10-CM | POA: Diagnosis not present

## 2022-02-22 DIAGNOSIS — R531 Weakness: Secondary | ICD-10-CM | POA: Diagnosis not present

## 2022-02-22 LAB — URINALYSIS, ROUTINE W REFLEX MICROSCOPIC
Glucose, UA: NEGATIVE mg/dL
Hgb urine dipstick: NEGATIVE
Ketones, ur: NEGATIVE mg/dL
Leukocytes,Ua: NEGATIVE
Nitrite: NEGATIVE
Protein, ur: 100 mg/dL — AB
Specific Gravity, Urine: 1.03 (ref 1.005–1.030)
pH: 5 (ref 5.0–8.0)

## 2022-02-22 LAB — CBC WITH DIFFERENTIAL/PLATELET
Abs Immature Granulocytes: 0.01 10*3/uL (ref 0.00–0.07)
Basophils Absolute: 0 10*3/uL (ref 0.0–0.1)
Basophils Relative: 1 %
Eosinophils Absolute: 0 10*3/uL (ref 0.0–0.5)
Eosinophils Relative: 0 %
HCT: 33.8 % — ABNORMAL LOW (ref 36.0–46.0)
Hemoglobin: 10.7 g/dL — ABNORMAL LOW (ref 12.0–15.0)
Immature Granulocytes: 0 %
Lymphocytes Relative: 33 %
Lymphs Abs: 1.7 10*3/uL (ref 0.7–4.0)
MCH: 30.5 pg (ref 26.0–34.0)
MCHC: 31.7 g/dL (ref 30.0–36.0)
MCV: 96.3 fL (ref 80.0–100.0)
Monocytes Absolute: 0.3 10*3/uL (ref 0.1–1.0)
Monocytes Relative: 6 %
Neutro Abs: 3.1 10*3/uL (ref 1.7–7.7)
Neutrophils Relative %: 60 %
Platelets: 438 10*3/uL — ABNORMAL HIGH (ref 150–400)
RBC: 3.51 MIL/uL — ABNORMAL LOW (ref 3.87–5.11)
RDW: 14 % (ref 11.5–15.5)
WBC: 5.1 10*3/uL (ref 4.0–10.5)
nRBC: 0 % (ref 0.0–0.2)

## 2022-02-22 LAB — BLOOD GAS, VENOUS
Acid-Base Excess: 4.4 mmol/L — ABNORMAL HIGH (ref 0.0–2.0)
Bicarbonate: 29.2 mmol/L — ABNORMAL HIGH (ref 20.0–28.0)
O2 Saturation: 96.8 %
Patient temperature: 37
pCO2, Ven: 43 mmHg — ABNORMAL LOW (ref 44–60)
pH, Ven: 7.44 — ABNORMAL HIGH (ref 7.25–7.43)
pO2, Ven: 81 mmHg — ABNORMAL HIGH (ref 32–45)

## 2022-02-22 LAB — COMPREHENSIVE METABOLIC PANEL
ALT: 28 U/L (ref 0–44)
AST: 29 U/L (ref 15–41)
Albumin: 2.7 g/dL — ABNORMAL LOW (ref 3.5–5.0)
Alkaline Phosphatase: 83 U/L (ref 38–126)
Anion gap: 8 (ref 5–15)
BUN: 10 mg/dL (ref 6–20)
CO2: 25 mmol/L (ref 22–32)
Calcium: 8.7 mg/dL — ABNORMAL LOW (ref 8.9–10.3)
Chloride: 109 mmol/L (ref 98–111)
Creatinine, Ser: 0.34 mg/dL — ABNORMAL LOW (ref 0.44–1.00)
GFR, Estimated: >60 mL/min (ref >60)
Glucose, Bld: 104 mg/dL — ABNORMAL HIGH (ref 70–99)
Potassium: 3.2 mmol/L — ABNORMAL LOW (ref 3.5–5.1)
Sodium: 142 mmol/L (ref 135–145)
Total Bilirubin: 0.4 mg/dL (ref 0.3–1.2)
Total Protein: 6.1 g/dL — ABNORMAL LOW (ref 6.5–8.1)

## 2022-02-22 LAB — RESP PANEL BY RT-PCR (RSV, FLU A&B, COVID)  RVPGX2
Influenza A by PCR: NEGATIVE
Influenza B by PCR: NEGATIVE
Resp Syncytial Virus by PCR: NEGATIVE
SARS Coronavirus 2 by RT PCR: NEGATIVE

## 2022-02-22 MED ORDER — SODIUM CHLORIDE 0.9 % IV BOLUS
1000.0000 mL | Freq: Once | INTRAVENOUS | Status: AC
  Administered 2022-02-22: 1000 mL via INTRAVENOUS

## 2022-02-22 MED ORDER — SODIUM CHLORIDE 0.9 % IV BOLUS: 1000.0000 mL | Freq: Once | INTRAVENOUS | Status: DC

## 2022-02-22 NOTE — Discharge Instructions (Signed)
Your labs show mild dehydration.  You are given IV fluids in the ED.  Your COVID test was negative and your urinalysis did not show an infection.  If you are still not eating or drinking much, I recommend that you talk to your doctor about goals of care.  You may benefit from a G-tube if that is consistent with your values  Return to ER if you have worsening lethargy, vomiting, severe pain, fever

## 2022-02-22 NOTE — ED Notes (Signed)
[facility] called and transportation requested.  [facility] also called and report given to [facility].

## 2022-02-22 NOTE — ED Provider Notes (Signed)
[facility] DEPT Provider Note   CSN: [id number] Arrival date & time: 02/22/22  1935     History  Chief Complaint  Patient presents with   Dehydration    CARLISLE TORGESON is a 62 y.o. female history of dementia who is bedbound here presenting with decreased appetite and possible dehydration.  Patient is bedbound at baseline.  She is from [facility] facility.  Patient has a daughter who saw her about a week ago.  She states that mother has been drinking less and less.  She is also concerned for the sacral decub ulcer.  Daughter called the facility and requested IV fluids and evaluation in the ED.  Patient is unable to give me any history.  She is demented and contracted at baseline.  Patient is nonverbal and bedbound.  EMS noticed that her CO2 level is elevated as well.  The history is provided by the EMS personnel and a significant other.       Home Medications Prior to Admission medications   Medication Sig Start Date End Date Taking? Authorizing Provider  acetaminophen (TYLENOL) 500 MG tablet Take 500 mg by mouth every 6 (six) hours as needed for moderate pain.    [provider]  carbamazepine (TEGRETOL) 200 MG tablet Take 2 tablets (400 mg total) by mouth 2 (two) times daily. 05/28/20   Samella Parr, NP  carbamazepine (TEGRETOL) 200 MG tablet Take 2 tablets (400 mg total) by mouth every evening. 05/28/20   Samella Parr, NP  citalopram (CELEXA) 40 MG tablet Take 1 tablet (40 mg total) by mouth daily. 05/28/20   Samella Parr, NP  Ensure Plus (ENSURE PLUS) LIQD Take 1 Can by mouth 2 (two) times daily between meals. Patient not taking: No sig reported 11/18/19   [name], [name], DO  gabapentin (NEURONTIN) 400 MG capsule Take 1 capsule (400 mg total) by mouth daily after lunch. 06/10/20   Domenic Polite, MD  LORazepam (ATIVAN) 0.5 MG tablet Take 1 tablet (0.5 mg total) by mouth 2 (two) times daily. 05/28/20   Samella Parr, NP   QUEtiapine (SEROQUEL) 100 MG tablet Take 1 tablet (100 mg total) by mouth 2 (two) times daily. 05/28/20   Samella Parr, NP  QUEtiapine (SEROQUEL) 50 MG tablet Take 3 tablets (150 mg total) by mouth at bedtime. 05/28/20   Samella Parr, NP      Allergies    Hydrocodone-acetaminophen and Donepezil    Review of Systems   Review of Systems  Psychiatric/Behavioral:  Positive for confusion.   All other systems reviewed and are negative.   Physical Exam Updated Vital Signs BP 97/71   Pulse 92   Temp 99.1 F (37.3 C) (Rectal)   Resp 13   SpO2 96%  Physical Exam Vitals and nursing note reviewed.  Constitutional:      Comments: Contracted and confused and demented  HENT:     Head: Normocephalic.     Nose: Nose normal.     Mouth/Throat:     Mouth: Mucous membranes are dry.  Eyes:     Extraocular Movements: Extraocular movements intact.     Pupils: Pupils are equal, round, and reactive to light.  Cardiovascular:     Rate and Rhythm: Normal rate.     Pulses: Normal pulses.  Pulmonary:     Effort: Pulmonary effort is normal.     Comments: Diminished Abdominal:     General: Abdomen is flat.     Palpations:  Abdomen is soft.  Musculoskeletal:     Cervical back: Normal range of motion and neck supple.     Comments: Contracted and patient has stage I R hip ulcer.  Skin:    General: Skin is warm.     Capillary Refill: Capillary refill takes less than 2 seconds.  Neurological:     Comments: Demented and contracted.  Not following commands  Psychiatric:     Comments: Unable     ED Results / Procedures / Treatments   Labs (all labs ordered are listed, but only abnormal results are displayed) Labs Reviewed  CBC WITH DIFFERENTIAL/PLATELET - Abnormal; Notable for the following components:      Result Value   RBC 3.51 (*)    Hemoglobin 10.7 (*)    HCT 33.8 (*)    Platelets 438 (*)    All other components within normal limits  BLOOD GAS, VENOUS - Abnormal; Notable for the  following components:   pH, Ven 7.44 (*)    pCO2, Ven 43 (*)    pO2, Ven 81 (*)    Bicarbonate 29.2 (*)    Acid-Base Excess 4.4 (*)    All other components within normal limits  RESP PANEL BY RT-PCR (RSV, FLU A&B, COVID)  RVPGX2  URINE CULTURE  COMPREHENSIVE METABOLIC PANEL  URINALYSIS, ROUTINE W REFLEX MICROSCOPIC    EKG EKG Interpretation  Date/Time:  Wednesday February 22 2022 20:20:59 EST Ventricular Rate:  93 PR Interval:  147 QRS Duration: 81 QT Interval:  352 QTC Calculation: 438 R Axis:   59 Text Interpretation: Sinus rhythm Low voltage, precordial leads Borderline T abnormalities, anterior leads No significant change since last tracing Confirmed by Wandra Arthurs [phone]) on 02/22/2022 9:31:40 PM  Radiology No results found.  Procedures Procedures    Medications Ordered in ED Medications  sodium chloride 0.9 % bolus 1,000 mL (1,000 mLs Intravenous New Bag/Given 02/22/22 2134)    ED Course/ Medical Decision Making/ A&P                           Medical Decision Making TAMBERLY POMPLUN is a 62 y.o. female here presenting with failure to thrive.  Patient demented and contracted at baseline.  Daughter visited her a week ago and was concerned that her skin turgor was decreased and wanted her evaluated and get labs and IV fluids.  Patient unable to give me much history and it appears that she is demented and this is close to her baseline.  She does appear dehydrated.  She also has low-grade temperature 99.  Plan to get CBC and CMP and UA and COVID and flu test and chest x-ray.  11:02 PM Labs unremarkable and UA showed no UTI.  VBG is reassuring and CO2 is 43 but her pH is normal.  COVID and flu test are negative.  Chest x-ray is clear.  Patient given 2 L normal saline bolus.  I updated daughter and told her that she is stable for discharge back to facility.  If she is still not eating, they may want to consider getting a G-tube.   Amount and/or Complexity of Data  Reviewed Labs: ordered. Decision-making details documented in ED Course. Radiology: ordered and independent interpretation performed. Decision-making details documented in ED Course.    Final Clinical Impression(s) / ED Diagnoses Final diagnoses:  None    Rx / DC Orders ED Discharge Orders     None  Charlynne Pander, MD 02/22/22 [phone]

## 2022-02-22 NOTE — ED Triage Notes (Signed)
Per EMS, pt from [place], family called to transport to the hospital b/c they felt that she was dehydrated.  Staff says she is at her baseline, with sever dementia, alzheimer's disease and is usually non verbal and bed bound. EMS found her only response to pain, but noted her CO2 level as 22 and after placing on 2L [place] her CO2 decreased to 11.  Note: no body else at the facility had a high CO2 reading.  EMS reports she has become much more alert after the O2.

## 2022-02-23 DIAGNOSIS — R404 Transient alteration of awareness: Secondary | ICD-10-CM | POA: Diagnosis not present

## 2022-02-23 DIAGNOSIS — Z7401 Bed confinement status: Secondary | ICD-10-CM | POA: Diagnosis not present

## 2022-02-23 DIAGNOSIS — E860 Dehydration: Secondary | ICD-10-CM | POA: Diagnosis not present

## 2022-02-23 DIAGNOSIS — Z743 Need for continuous supervision: Secondary | ICD-10-CM | POA: Diagnosis not present

## 2022-02-24 DIAGNOSIS — L89512 Pressure ulcer of right ankle, stage 2: Secondary | ICD-10-CM | POA: Diagnosis not present

## 2022-02-24 DIAGNOSIS — M24542 Contracture, left hand: Secondary | ICD-10-CM | POA: Diagnosis not present

## 2022-02-24 DIAGNOSIS — R627 Adult failure to thrive: Secondary | ICD-10-CM | POA: Diagnosis not present

## 2022-02-24 DIAGNOSIS — F03B3 Unspecified dementia, moderate, with mood disturbance: Secondary | ICD-10-CM | POA: Diagnosis not present

## 2022-02-24 DIAGNOSIS — R634 Abnormal weight loss: Secondary | ICD-10-CM | POA: Diagnosis not present

## 2022-02-24 DIAGNOSIS — M62462 Contracture of muscle, left lower leg: Secondary | ICD-10-CM | POA: Diagnosis not present

## 2022-02-24 DIAGNOSIS — L89222 Pressure ulcer of left hip, stage 2: Secondary | ICD-10-CM | POA: Diagnosis not present

## 2022-02-24 DIAGNOSIS — E440 Moderate protein-calorie malnutrition: Secondary | ICD-10-CM | POA: Diagnosis not present

## 2022-02-24 DIAGNOSIS — M62461 Contracture of muscle, right lower leg: Secondary | ICD-10-CM | POA: Diagnosis not present

## 2022-02-24 LAB — URINE CULTURE

## 2022-03-01 DIAGNOSIS — E785 Hyperlipidemia, unspecified: Secondary | ICD-10-CM | POA: Diagnosis not present

## 2022-03-01 DIAGNOSIS — E441 Mild protein-calorie malnutrition: Secondary | ICD-10-CM | POA: Diagnosis not present

## 2022-03-01 DIAGNOSIS — R627 Adult failure to thrive: Secondary | ICD-10-CM | POA: Diagnosis not present

## 2022-03-01 DIAGNOSIS — J45909 Unspecified asthma, uncomplicated: Secondary | ICD-10-CM | POA: Diagnosis not present

## 2022-03-01 DIAGNOSIS — M6281 Muscle weakness (generalized): Secondary | ICD-10-CM | POA: Diagnosis not present

## 2022-03-01 DIAGNOSIS — L89202 Pressure ulcer of unspecified hip, stage 2: Secondary | ICD-10-CM | POA: Diagnosis not present

## 2022-03-01 DIAGNOSIS — I10 Essential (primary) hypertension: Secondary | ICD-10-CM | POA: Diagnosis not present

## 2022-03-01 DIAGNOSIS — L89892 Pressure ulcer of other site, stage 2: Secondary | ICD-10-CM | POA: Diagnosis not present

## 2022-03-01 DIAGNOSIS — E440 Moderate protein-calorie malnutrition: Secondary | ICD-10-CM | POA: Diagnosis not present

## 2022-03-01 DIAGNOSIS — R5381 Other malaise: Secondary | ICD-10-CM | POA: Diagnosis not present

## 2022-03-01 DIAGNOSIS — R239 Unspecified skin changes: Secondary | ICD-10-CM | POA: Diagnosis not present

## 2022-03-01 DIAGNOSIS — L89210 Pressure ulcer of right hip, unstageable: Secondary | ICD-10-CM | POA: Diagnosis not present

## 2022-03-02 DIAGNOSIS — I10 Essential (primary) hypertension: Secondary | ICD-10-CM | POA: Diagnosis not present

## 2022-03-02 DIAGNOSIS — E785 Hyperlipidemia, unspecified: Secondary | ICD-10-CM | POA: Diagnosis not present

## 2022-03-02 DIAGNOSIS — J45909 Unspecified asthma, uncomplicated: Secondary | ICD-10-CM | POA: Diagnosis not present

## 2022-03-02 DIAGNOSIS — G300 Alzheimer's disease with early onset: Secondary | ICD-10-CM | POA: Diagnosis not present

## 2022-03-03 DIAGNOSIS — J4520 Mild intermittent asthma, uncomplicated: Secondary | ICD-10-CM | POA: Diagnosis not present

## 2022-03-06 DIAGNOSIS — R5381 Other malaise: Secondary | ICD-10-CM | POA: Diagnosis not present

## 2022-03-06 DIAGNOSIS — G300 Alzheimer's disease with early onset: Secondary | ICD-10-CM | POA: Diagnosis not present

## 2022-03-06 DIAGNOSIS — I10 Essential (primary) hypertension: Secondary | ICD-10-CM | POA: Diagnosis not present

## 2022-03-06 DIAGNOSIS — R627 Adult failure to thrive: Secondary | ICD-10-CM | POA: Diagnosis not present

## 2022-03-06 DIAGNOSIS — F02C2 Dementia in other diseases classified elsewhere, severe, with psychotic disturbance: Secondary | ICD-10-CM | POA: Diagnosis not present

## 2022-03-06 DIAGNOSIS — E785 Hyperlipidemia, unspecified: Secondary | ICD-10-CM | POA: Diagnosis not present

## 2022-03-06 DIAGNOSIS — J45909 Unspecified asthma, uncomplicated: Secondary | ICD-10-CM | POA: Diagnosis not present

## 2022-03-07 ENCOUNTER — Telehealth: Payer: Self-pay

## 2022-03-07 DIAGNOSIS — L89623 Pressure ulcer of left heel, stage 3: Secondary | ICD-10-CM | POA: Diagnosis not present

## 2022-03-07 DIAGNOSIS — L89890 Pressure ulcer of other site, unstageable: Secondary | ICD-10-CM | POA: Diagnosis not present

## 2022-03-07 DIAGNOSIS — L89213 Pressure ulcer of right hip, stage 3: Secondary | ICD-10-CM | POA: Diagnosis not present

## 2022-03-07 NOTE — Telephone Encounter (Signed)
        Patient  visited [facility] on 1/11     Telephone encounter attempt :  1st  Missing or Invalid Number     [place], [employer] [employer]  [phone] [address], [place], [place] [address] Phone: [phone] Email: [email or web address]

## 2022-03-08 ENCOUNTER — Telehealth: Payer: Self-pay

## 2022-03-08 NOTE — Telephone Encounter (Signed)
        Patient  visited [facility] on 1/11    Telephone encounter attempt :  2nd  Unable to [place], [facility] [employer]  [phone] [address], [place], [place] [address] Phone: [phone] Email: [email or web address]

## 2022-03-09 DIAGNOSIS — E785 Hyperlipidemia, unspecified: Secondary | ICD-10-CM | POA: Diagnosis not present

## 2022-03-09 DIAGNOSIS — I10 Essential (primary) hypertension: Secondary | ICD-10-CM | POA: Diagnosis not present

## 2022-03-09 DIAGNOSIS — J45909 Unspecified asthma, uncomplicated: Secondary | ICD-10-CM | POA: Diagnosis not present

## 2022-03-09 DIAGNOSIS — R5381 Other malaise: Secondary | ICD-10-CM | POA: Diagnosis not present

## 2022-03-09 DIAGNOSIS — R627 Adult failure to thrive: Secondary | ICD-10-CM | POA: Diagnosis not present

## 2022-03-10 DIAGNOSIS — R627 Adult failure to thrive: Secondary | ICD-10-CM | POA: Diagnosis not present

## 2022-03-10 DIAGNOSIS — L89210 Pressure ulcer of right hip, unstageable: Secondary | ICD-10-CM | POA: Diagnosis not present

## 2022-03-10 DIAGNOSIS — M6281 Muscle weakness (generalized): Secondary | ICD-10-CM | POA: Diagnosis not present

## 2022-03-10 DIAGNOSIS — R239 Unspecified skin changes: Secondary | ICD-10-CM | POA: Diagnosis not present

## 2022-03-10 DIAGNOSIS — E441 Mild protein-calorie malnutrition: Secondary | ICD-10-CM | POA: Diagnosis not present

## 2022-03-12 ENCOUNTER — Emergency Department (HOSPITAL_COMMUNITY): Payer: 59

## 2022-03-12 ENCOUNTER — Emergency Department (HOSPITAL_COMMUNITY)
Admission: EM | Admit: 2022-03-12 | Discharge: 2022-03-12 | Disposition: A | Discharge status: Skilled Nursing Facility | Payer: 59 | Attending: Emergency Medicine | Admitting: Emergency Medicine

## 2022-03-12 ENCOUNTER — Other Ambulatory Visit: Payer: Self-pay

## 2022-03-12 DIAGNOSIS — R609 Edema, unspecified: Secondary | ICD-10-CM | POA: Diagnosis not present

## 2022-03-12 DIAGNOSIS — S0990XA Unspecified injury of head, initial encounter: Secondary | ICD-10-CM

## 2022-03-12 DIAGNOSIS — R404 Transient alteration of awareness: Secondary | ICD-10-CM | POA: Diagnosis not present

## 2022-03-12 DIAGNOSIS — S0003XA Contusion of scalp, initial encounter: Secondary | ICD-10-CM | POA: Diagnosis not present

## 2022-03-12 DIAGNOSIS — W06XXXA Fall from bed, initial encounter: Secondary | ICD-10-CM | POA: Insufficient documentation

## 2022-03-12 DIAGNOSIS — Z743 Need for continuous supervision: Secondary | ICD-10-CM | POA: Diagnosis not present

## 2022-03-12 DIAGNOSIS — W19XXXA Unspecified fall, initial encounter: Secondary | ICD-10-CM | POA: Diagnosis not present

## 2022-03-12 DIAGNOSIS — Z7401 Bed confinement status: Secondary | ICD-10-CM | POA: Diagnosis not present

## 2022-03-12 DIAGNOSIS — S199XXA Unspecified injury of neck, initial encounter: Secondary | ICD-10-CM | POA: Diagnosis not present

## 2022-03-12 NOTE — Discharge Instructions (Signed)
Return for any problem.  ?

## 2022-03-12 NOTE — ED Provider Notes (Signed)
[facility] Provider Note   CSN: [id number] Arrival date & time: 03/12/22  1524     History  Chief Complaint  Patient presents with   Fall   Head Injury    Sherry Hicks is a 62 y.o. female.  62 year old female with prior medical history as detailed below presents from [place] for evaluation.  Patient fell from her bed.  Patient is nonambulatory at baseline.  Patient did strike her head with the fall.  Patient with superficial abrasion to forehead and superficial small hematoma to forehead noted.  Patient with advanced dementia.  Patient is essentially nonverbal.  She is contracted and bedbound at baseline.  Tetanus is up-to-date.  The history is provided by the patient, medical records and the EMS personnel.  Head Injury      Home Medications Prior to Admission medications   Medication Sig Start Date End Date Taking? Authorizing Provider  acetaminophen (TYLENOL) 500 MG tablet Take 500 mg by mouth every 6 (six) hours as needed for moderate pain.    [provider]  carbamazepine (TEGRETOL) 200 MG tablet Take 2 tablets (400 mg total) by mouth 2 (two) times daily. 05/28/20   Samella Parr, NP  carbamazepine (TEGRETOL) 200 MG tablet Take 2 tablets (400 mg total) by mouth every evening. 05/28/20   Samella Parr, NP  citalopram (CELEXA) 40 MG tablet Take 1 tablet (40 mg total) by mouth daily. 05/28/20   Samella Parr, NP  Ensure Plus (ENSURE PLUS) LIQD Take 1 Can by mouth 2 (two) times daily between meals. Patient not taking: No sig reported 11/18/19   [name], [name], DO  gabapentin (NEURONTIN) 400 MG capsule Take 1 capsule (400 mg total) by mouth daily after lunch. 06/10/20   Domenic Polite, MD  LORazepam (ATIVAN) 0.5 MG tablet Take 1 tablet (0.5 mg total) by mouth 2 (two) times daily. 05/28/20   Samella Parr, NP  QUEtiapine (SEROQUEL) 100 MG tablet Take 1 tablet (100 mg total) by mouth 2 (two) times  daily. 05/28/20   Samella Parr, NP  QUEtiapine (SEROQUEL) 50 MG tablet Take 3 tablets (150 mg total) by mouth at bedtime. 05/28/20   Samella Parr, NP      Allergies    Hydrocodone-acetaminophen and Donepezil    Review of Systems   Review of Systems  All other systems reviewed and are negative.   Physical Exam Updated Vital Signs SpO2 98%  Physical Exam Vitals and nursing note reviewed.  Constitutional:      General: She is not in acute distress.    Appearance: She is well-developed.  HENT:     Head: Normocephalic.     Comments: Superficial abrasion noted to mid forehead with small surrounding hematoma.  No lacerations appreciated. Eyes:     Conjunctiva/sclera: Conjunctivae normal.     Pupils: Pupils are equal, round, and reactive to light.  Cardiovascular:     Rate and Rhythm: Normal rate and regular rhythm.     Heart sounds: Normal heart sounds.  Pulmonary:     Effort: Pulmonary effort is normal. No respiratory distress.     Breath sounds: Normal breath sounds.  Abdominal:     General: There is no distension.     Palpations: Abdomen is soft.     Tenderness: There is no abdominal tenderness.  Musculoskeletal:        General: No deformity. Normal range of motion.     Comments: Contracted upper  and lower extremities chronically.  Skin:    General: Skin is warm and dry.  Neurological:     General: No focal deficit present.     Mental Status: She is alert and oriented to person, place, and time.     ED Results / Procedures / Treatments   Labs (all labs ordered are listed, but only abnormal results are displayed) Labs Reviewed - No data to display  EKG None  Radiology CT Cervical Spine Wo Contrast  Result Date: 03/12/2022 CLINICAL DATA:  Fall.  Head and neck trauma. EXAM: CT HEAD WITHOUT CONTRAST CT CERVICAL SPINE WITHOUT CONTRAST TECHNIQUE: Multidetector CT imaging of the head and cervical spine was performed following the standard protocol without  intravenous contrast. Multiplanar CT image reconstructions of the cervical spine were also generated. RADIATION DOSE REDUCTION: This exam was performed according to the departmental dose-optimization program which includes automated exposure control, adjustment of the mA and/or kV according to patient size and/or use of iterative reconstruction technique. COMPARISON:  06/12/2020 FINDINGS: CT HEAD FINDINGS Brain: No evidence of intracranial hemorrhage, acute infarction, extra-axial collection, or mass lesion/mass effect. Stable chronic ventriculomegaly and cerebral atrophy. Vascular:  No hyperdense vessel or other acute findings. Skull: No evidence of fracture or other significant bone abnormality. Sinuses/Orbits:  No acute findings. Other: Mild frontal scalp hematoma noted. CT CERVICAL SPINE FINDINGS Alignment: Normal. Skull base and vertebrae: No acute fracture. No primary bone lesion or focal pathologic process. Soft tissues and spinal canal: No prevertebral fluid or swelling. No visible canal hematoma. Disc levels: Mild degenerative disc disease noted at C3-4. No significant facet arthropathy. Upper chest: No acute findings. Other: None. IMPRESSION: Mild frontal scalp hematoma. No evidence of skull fracture or acute intracranial abnormality. No evidence of acute cervical spine fracture or subluxation. Mild degenerative disc disease . Electronically Signed   By: Danae Orleans M.D.   On: 03/12/2022 16:56   CT Head Wo Contrast  Result Date: 03/12/2022 CLINICAL DATA:  Fall.  Head and neck trauma. EXAM: CT HEAD WITHOUT CONTRAST CT CERVICAL SPINE WITHOUT CONTRAST TECHNIQUE: Multidetector CT imaging of the head and cervical spine was performed following the standard protocol without intravenous contrast. Multiplanar CT image reconstructions of the cervical spine were also generated. RADIATION DOSE REDUCTION: This exam was performed according to the departmental dose-optimization program which includes automated  exposure control, adjustment of the mA and/or kV according to patient size and/or use of iterative reconstruction technique. COMPARISON:  06/12/2020 FINDINGS: CT HEAD FINDINGS Brain: No evidence of intracranial hemorrhage, acute infarction, extra-axial collection, or mass lesion/mass effect. Stable chronic ventriculomegaly and cerebral atrophy. Vascular:  No hyperdense vessel or other acute findings. Skull: No evidence of fracture or other significant bone abnormality. Sinuses/Orbits:  No acute findings. Other: Mild frontal scalp hematoma noted. CT CERVICAL SPINE FINDINGS Alignment: Normal. Skull base and vertebrae: No acute fracture. No primary bone lesion or focal pathologic process. Soft tissues and spinal canal: No prevertebral fluid or swelling. No visible canal hematoma. Disc levels: Mild degenerative disc disease noted at C3-4. No significant facet arthropathy. Upper chest: No acute findings. Other: None. IMPRESSION: Mild frontal scalp hematoma. No evidence of skull fracture or acute intracranial abnormality. No evidence of acute cervical spine fracture or subluxation. Mild degenerative disc disease . Electronically Signed   By: Danae Orleans M.D.   On: 03/12/2022 16:56    Procedures Procedures    Medications Ordered in ED Medications - No data to display  ED Course/ Medical Decision Making/ A&P  Medical Decision Making Amount and/or Complexity of Data Reviewed Radiology: ordered.    Medical Screen Complete  This patient presented to the ED with complaint of fall, head injury.  This complaint involves an extensive number of treatment options. The initial differential diagnosis includes, but is not limited to, trauma related to fall  This presentation is: Acute, Chronic, Self-Limited, Previously Undiagnosed, Uncertain Prognosis, Complicated, Systemic Symptoms, and Threat to Life/Bodily Function  Patient with advanced dementia, bedbound, presents after fall  from her bed.  She struck her head.  She is at baseline mental status.  Patient with superficial abrasion to forehead.  Tetanus is up-to-date  Imaging is not suggestive of acute pathology.  Patient's daughter is aware of ED findings.  She agrees with plan for discharge back to patient's facility.  Patient's daughter understands need for close outpatient follow-up.  Additional history obtained:  Additional history obtained from EMS External records from outside sources obtained and reviewed including prior ED visits and prior Inpatient records.   Imaging Studies ordered:  I ordered imaging studies including CT head, CT cervical spine I independently visualized and interpreted obtained imaging which showed NAD I agree with the radiologist interpretation.   Cardiac Monitoring:  The patient was maintained on a cardiac monitor.  I personally viewed and interpreted the cardiac monitor which showed an underlying rhythm of: NSR    Problem List / ED Course:  Fall, head injury   Reevaluation:  After the interventions noted above, I reevaluated the patient and found that they have: improved Disposition:  After consideration of the diagnostic results and the patients response to treatment, I feel that the patent would benefit from close outpatient follow-up.          Final Clinical Impression(s) / ED Diagnoses Final diagnoses:  Injury of head, initial encounter    Rx / DC Orders ED Discharge Orders     None         Valarie Merino, MD 03/12/22 [phone]

## 2022-03-12 NOTE — ED Notes (Signed)
[facility] called. Pt on transport list 

## 2022-03-12 NOTE — ED Triage Notes (Signed)
Pt BIBA from [place]. Pt had unwitnessed fall from bed. Was on floor less than 5 min. Pt has hematoma on forehead.  Not on blood thinners.  Hx dementia. Bedbound. Contracted. Pt at baseline.   Wearing C-collar on arrival to ED.  BP: 112/78 HR: 88 SPO2: 97 RA

## 2022-03-13 DIAGNOSIS — M6281 Muscle weakness (generalized): Secondary | ICD-10-CM | POA: Diagnosis not present

## 2022-03-13 DIAGNOSIS — Z741 Need for assistance with personal care: Secondary | ICD-10-CM | POA: Diagnosis not present

## 2022-03-14 DIAGNOSIS — Z741 Need for assistance with personal care: Secondary | ICD-10-CM | POA: Diagnosis not present

## 2022-03-14 DIAGNOSIS — M6281 Muscle weakness (generalized): Secondary | ICD-10-CM | POA: Diagnosis not present

## 2022-03-15 DIAGNOSIS — R627 Adult failure to thrive: Secondary | ICD-10-CM | POA: Diagnosis not present

## 2022-03-15 DIAGNOSIS — E441 Mild protein-calorie malnutrition: Secondary | ICD-10-CM | POA: Diagnosis not present

## 2022-03-15 DIAGNOSIS — M6281 Muscle weakness (generalized): Secondary | ICD-10-CM | POA: Diagnosis not present

## 2022-03-15 DIAGNOSIS — R239 Unspecified skin changes: Secondary | ICD-10-CM | POA: Diagnosis not present

## 2022-03-15 DIAGNOSIS — L89210 Pressure ulcer of right hip, unstageable: Secondary | ICD-10-CM | POA: Diagnosis not present

## 2022-03-15 DIAGNOSIS — Z741 Need for assistance with personal care: Secondary | ICD-10-CM | POA: Diagnosis not present

## 2022-03-16 DIAGNOSIS — Z741 Need for assistance with personal care: Secondary | ICD-10-CM | POA: Diagnosis not present

## 2022-03-16 DIAGNOSIS — M6281 Muscle weakness (generalized): Secondary | ICD-10-CM | POA: Diagnosis not present

## 2022-03-17 DIAGNOSIS — M6281 Muscle weakness (generalized): Secondary | ICD-10-CM | POA: Diagnosis not present

## 2022-03-17 DIAGNOSIS — Z741 Need for assistance with personal care: Secondary | ICD-10-CM | POA: Diagnosis not present

## 2022-03-20 DIAGNOSIS — M6281 Muscle weakness (generalized): Secondary | ICD-10-CM | POA: Diagnosis not present

## 2022-03-20 DIAGNOSIS — Z741 Need for assistance with personal care: Secondary | ICD-10-CM | POA: Diagnosis not present

## 2022-03-21 ENCOUNTER — Telehealth: Payer: Self-pay

## 2022-03-21 DIAGNOSIS — Z741 Need for assistance with personal care: Secondary | ICD-10-CM | POA: Diagnosis not present

## 2022-03-21 DIAGNOSIS — M6281 Muscle weakness (generalized): Secondary | ICD-10-CM | POA: Diagnosis not present

## 2022-03-21 DIAGNOSIS — L89513 Pressure ulcer of right ankle, stage 3: Secondary | ICD-10-CM | POA: Diagnosis not present

## 2022-03-21 NOTE — Telephone Encounter (Signed)
        Patient  visited [facility] on 1/28   Telephone encounter attempt :  1st  A HIPAA compliant voice message was left requesting a return call.  Instructed patient to call back     [facility] [phone] [address], [place], [place] [address] Phone: [phone] Email: [email or web address]

## 2022-03-22 ENCOUNTER — Telehealth: Payer: Self-pay

## 2022-03-22 DIAGNOSIS — L89893 Pressure ulcer of other site, stage 3: Secondary | ICD-10-CM | POA: Diagnosis not present

## 2022-03-22 DIAGNOSIS — M6281 Muscle weakness (generalized): Secondary | ICD-10-CM | POA: Diagnosis not present

## 2022-03-22 DIAGNOSIS — E441 Mild protein-calorie malnutrition: Secondary | ICD-10-CM | POA: Diagnosis not present

## 2022-03-22 DIAGNOSIS — R239 Unspecified skin changes: Secondary | ICD-10-CM | POA: Diagnosis not present

## 2022-03-22 DIAGNOSIS — L89513 Pressure ulcer of right ankle, stage 3: Secondary | ICD-10-CM | POA: Diagnosis not present

## 2022-03-22 DIAGNOSIS — R627 Adult failure to thrive: Secondary | ICD-10-CM | POA: Diagnosis not present

## 2022-03-22 DIAGNOSIS — Z741 Need for assistance with personal care: Secondary | ICD-10-CM | POA: Diagnosis not present

## 2022-03-22 DIAGNOSIS — L89213 Pressure ulcer of right hip, stage 3: Secondary | ICD-10-CM | POA: Diagnosis not present

## 2022-03-22 NOTE — Telephone Encounter (Signed)
         Patient  visited [facility] on 1/28    Telephone encounter attempt :  2nd  A HIPAA compliant voice message was left requesting a return call.  Instructed patient to call back     [facility], [place] [phone] [address], [place], [place] [address] Phone: [phone] Email: [email or web address]       

## 2022-03-23 DIAGNOSIS — M6281 Muscle weakness (generalized): Secondary | ICD-10-CM | POA: Diagnosis not present

## 2022-03-23 DIAGNOSIS — Z741 Need for assistance with personal care: Secondary | ICD-10-CM | POA: Diagnosis not present

## 2022-03-24 DIAGNOSIS — Z741 Need for assistance with personal care: Secondary | ICD-10-CM | POA: Diagnosis not present

## 2022-03-24 DIAGNOSIS — M6281 Muscle weakness (generalized): Secondary | ICD-10-CM | POA: Diagnosis not present

## 2022-03-28 DIAGNOSIS — Z741 Need for assistance with personal care: Secondary | ICD-10-CM | POA: Diagnosis not present

## 2022-03-28 DIAGNOSIS — M6281 Muscle weakness (generalized): Secondary | ICD-10-CM | POA: Diagnosis not present

## 2022-03-29 DIAGNOSIS — Z741 Need for assistance with personal care: Secondary | ICD-10-CM | POA: Diagnosis not present

## 2022-03-29 DIAGNOSIS — M6281 Muscle weakness (generalized): Secondary | ICD-10-CM | POA: Diagnosis not present

## 2022-03-30 DIAGNOSIS — R239 Unspecified skin changes: Secondary | ICD-10-CM | POA: Diagnosis not present

## 2022-03-30 DIAGNOSIS — M6281 Muscle weakness (generalized): Secondary | ICD-10-CM | POA: Diagnosis not present

## 2022-03-30 DIAGNOSIS — L89513 Pressure ulcer of right ankle, stage 3: Secondary | ICD-10-CM | POA: Diagnosis not present

## 2022-03-30 DIAGNOSIS — Z741 Need for assistance with personal care: Secondary | ICD-10-CM | POA: Diagnosis not present

## 2022-03-30 DIAGNOSIS — R627 Adult failure to thrive: Secondary | ICD-10-CM | POA: Diagnosis not present

## 2022-03-30 DIAGNOSIS — L89213 Pressure ulcer of right hip, stage 3: Secondary | ICD-10-CM | POA: Diagnosis not present

## 2022-03-30 DIAGNOSIS — L89893 Pressure ulcer of other site, stage 3: Secondary | ICD-10-CM | POA: Diagnosis not present

## 2022-03-30 DIAGNOSIS — E441 Mild protein-calorie malnutrition: Secondary | ICD-10-CM | POA: Diagnosis not present

## 2022-03-31 DIAGNOSIS — J45909 Unspecified asthma, uncomplicated: Secondary | ICD-10-CM | POA: Diagnosis not present

## 2022-03-31 DIAGNOSIS — Z741 Need for assistance with personal care: Secondary | ICD-10-CM | POA: Diagnosis not present

## 2022-03-31 DIAGNOSIS — G300 Alzheimer's disease with early onset: Secondary | ICD-10-CM | POA: Diagnosis not present

## 2022-03-31 DIAGNOSIS — I10 Essential (primary) hypertension: Secondary | ICD-10-CM | POA: Diagnosis not present

## 2022-03-31 DIAGNOSIS — M6281 Muscle weakness (generalized): Secondary | ICD-10-CM | POA: Diagnosis not present

## 2022-03-31 DIAGNOSIS — E785 Hyperlipidemia, unspecified: Secondary | ICD-10-CM | POA: Diagnosis not present

## 2022-04-01 DIAGNOSIS — M6281 Muscle weakness (generalized): Secondary | ICD-10-CM | POA: Diagnosis not present

## 2022-04-01 DIAGNOSIS — Z741 Need for assistance with personal care: Secondary | ICD-10-CM | POA: Diagnosis not present

## 2022-04-03 DIAGNOSIS — M6281 Muscle weakness (generalized): Secondary | ICD-10-CM | POA: Diagnosis not present

## 2022-04-03 DIAGNOSIS — G300 Alzheimer's disease with early onset: Secondary | ICD-10-CM | POA: Diagnosis not present

## 2022-04-03 DIAGNOSIS — J4520 Mild intermittent asthma, uncomplicated: Secondary | ICD-10-CM | POA: Diagnosis not present

## 2022-04-03 DIAGNOSIS — E440 Moderate protein-calorie malnutrition: Secondary | ICD-10-CM | POA: Diagnosis not present

## 2022-04-03 DIAGNOSIS — Z741 Need for assistance with personal care: Secondary | ICD-10-CM | POA: Diagnosis not present

## 2022-04-03 DIAGNOSIS — R5381 Other malaise: Secondary | ICD-10-CM | POA: Diagnosis not present

## 2022-04-03 DIAGNOSIS — L89892 Pressure ulcer of other site, stage 2: Secondary | ICD-10-CM | POA: Diagnosis not present

## 2022-04-04 DIAGNOSIS — L89893 Pressure ulcer of other site, stage 3: Secondary | ICD-10-CM | POA: Diagnosis not present

## 2022-04-04 DIAGNOSIS — Z741 Need for assistance with personal care: Secondary | ICD-10-CM | POA: Diagnosis not present

## 2022-04-04 DIAGNOSIS — L89213 Pressure ulcer of right hip, stage 3: Secondary | ICD-10-CM | POA: Diagnosis not present

## 2022-04-04 DIAGNOSIS — M6281 Muscle weakness (generalized): Secondary | ICD-10-CM | POA: Diagnosis not present

## 2022-04-05 DIAGNOSIS — M6281 Muscle weakness (generalized): Secondary | ICD-10-CM | POA: Diagnosis not present

## 2022-04-05 DIAGNOSIS — Z741 Need for assistance with personal care: Secondary | ICD-10-CM | POA: Diagnosis not present

## 2022-04-06 DIAGNOSIS — Z741 Need for assistance with personal care: Secondary | ICD-10-CM | POA: Diagnosis not present

## 2022-04-06 DIAGNOSIS — M6281 Muscle weakness (generalized): Secondary | ICD-10-CM | POA: Diagnosis not present

## 2022-04-07 DIAGNOSIS — E441 Mild protein-calorie malnutrition: Secondary | ICD-10-CM | POA: Diagnosis not present

## 2022-04-07 DIAGNOSIS — L89213 Pressure ulcer of right hip, stage 3: Secondary | ICD-10-CM | POA: Diagnosis not present

## 2022-04-07 DIAGNOSIS — R239 Unspecified skin changes: Secondary | ICD-10-CM | POA: Diagnosis not present

## 2022-04-07 DIAGNOSIS — L89893 Pressure ulcer of other site, stage 3: Secondary | ICD-10-CM | POA: Diagnosis not present

## 2022-04-07 DIAGNOSIS — M6281 Muscle weakness (generalized): Secondary | ICD-10-CM | POA: Diagnosis not present

## 2022-04-07 DIAGNOSIS — L89513 Pressure ulcer of right ankle, stage 3: Secondary | ICD-10-CM | POA: Diagnosis not present

## 2022-04-07 DIAGNOSIS — Z741 Need for assistance with personal care: Secondary | ICD-10-CM | POA: Diagnosis not present

## 2022-04-07 DIAGNOSIS — R627 Adult failure to thrive: Secondary | ICD-10-CM | POA: Diagnosis not present

## 2022-04-10 DIAGNOSIS — G300 Alzheimer's disease with early onset: Secondary | ICD-10-CM | POA: Diagnosis not present

## 2022-04-10 DIAGNOSIS — Z741 Need for assistance with personal care: Secondary | ICD-10-CM | POA: Diagnosis not present

## 2022-04-10 DIAGNOSIS — F02C2 Dementia in other diseases classified elsewhere, severe, with psychotic disturbance: Secondary | ICD-10-CM | POA: Diagnosis not present

## 2022-04-10 DIAGNOSIS — M6281 Muscle weakness (generalized): Secondary | ICD-10-CM | POA: Diagnosis not present

## 2022-04-11 DIAGNOSIS — Z741 Need for assistance with personal care: Secondary | ICD-10-CM | POA: Diagnosis not present

## 2022-04-11 DIAGNOSIS — M6281 Muscle weakness (generalized): Secondary | ICD-10-CM | POA: Diagnosis not present

## 2022-04-12 DIAGNOSIS — E441 Mild protein-calorie malnutrition: Secondary | ICD-10-CM | POA: Diagnosis not present

## 2022-04-12 DIAGNOSIS — R239 Unspecified skin changes: Secondary | ICD-10-CM | POA: Diagnosis not present

## 2022-04-12 DIAGNOSIS — L89513 Pressure ulcer of right ankle, stage 3: Secondary | ICD-10-CM | POA: Diagnosis not present

## 2022-04-12 DIAGNOSIS — R627 Adult failure to thrive: Secondary | ICD-10-CM | POA: Diagnosis not present

## 2022-04-12 DIAGNOSIS — L89893 Pressure ulcer of other site, stage 3: Secondary | ICD-10-CM | POA: Diagnosis not present

## 2022-04-12 DIAGNOSIS — M6281 Muscle weakness (generalized): Secondary | ICD-10-CM | POA: Diagnosis not present

## 2022-04-12 DIAGNOSIS — L89213 Pressure ulcer of right hip, stage 3: Secondary | ICD-10-CM | POA: Diagnosis not present

## 2022-04-12 DIAGNOSIS — Z741 Need for assistance with personal care: Secondary | ICD-10-CM | POA: Diagnosis not present

## 2022-04-13 DIAGNOSIS — Z741 Need for assistance with personal care: Secondary | ICD-10-CM | POA: Diagnosis not present

## 2022-04-13 DIAGNOSIS — M6281 Muscle weakness (generalized): Secondary | ICD-10-CM | POA: Diagnosis not present

## 2022-04-14 DIAGNOSIS — M6281 Muscle weakness (generalized): Secondary | ICD-10-CM | POA: Diagnosis not present

## 2022-04-14 DIAGNOSIS — F03B3 Unspecified dementia, moderate, with mood disturbance: Secondary | ICD-10-CM | POA: Diagnosis not present

## 2022-04-14 DIAGNOSIS — R293 Abnormal posture: Secondary | ICD-10-CM | POA: Diagnosis not present

## 2022-04-14 DIAGNOSIS — Z741 Need for assistance with personal care: Secondary | ICD-10-CM | POA: Diagnosis not present

## 2022-04-17 DIAGNOSIS — R293 Abnormal posture: Secondary | ICD-10-CM | POA: Diagnosis not present

## 2022-04-17 DIAGNOSIS — M6281 Muscle weakness (generalized): Secondary | ICD-10-CM | POA: Diagnosis not present

## 2022-04-17 DIAGNOSIS — F03B3 Unspecified dementia, moderate, with mood disturbance: Secondary | ICD-10-CM | POA: Diagnosis not present

## 2022-04-17 DIAGNOSIS — Z741 Need for assistance with personal care: Secondary | ICD-10-CM | POA: Diagnosis not present

## 2022-04-18 DIAGNOSIS — M6281 Muscle weakness (generalized): Secondary | ICD-10-CM | POA: Diagnosis not present

## 2022-04-18 DIAGNOSIS — F03B3 Unspecified dementia, moderate, with mood disturbance: Secondary | ICD-10-CM | POA: Diagnosis not present

## 2022-04-18 DIAGNOSIS — R293 Abnormal posture: Secondary | ICD-10-CM | POA: Diagnosis not present

## 2022-04-18 DIAGNOSIS — Z741 Need for assistance with personal care: Secondary | ICD-10-CM | POA: Diagnosis not present

## 2022-04-19 DIAGNOSIS — E441 Mild protein-calorie malnutrition: Secondary | ICD-10-CM | POA: Diagnosis not present

## 2022-04-19 DIAGNOSIS — R239 Unspecified skin changes: Secondary | ICD-10-CM | POA: Diagnosis not present

## 2022-04-19 DIAGNOSIS — R627 Adult failure to thrive: Secondary | ICD-10-CM | POA: Diagnosis not present

## 2022-04-19 DIAGNOSIS — F03B3 Unspecified dementia, moderate, with mood disturbance: Secondary | ICD-10-CM | POA: Diagnosis not present

## 2022-04-19 DIAGNOSIS — R293 Abnormal posture: Secondary | ICD-10-CM | POA: Diagnosis not present

## 2022-04-19 DIAGNOSIS — M6281 Muscle weakness (generalized): Secondary | ICD-10-CM | POA: Diagnosis not present

## 2022-04-19 DIAGNOSIS — L89893 Pressure ulcer of other site, stage 3: Secondary | ICD-10-CM | POA: Diagnosis not present

## 2022-04-19 DIAGNOSIS — Z741 Need for assistance with personal care: Secondary | ICD-10-CM | POA: Diagnosis not present

## 2022-04-19 DIAGNOSIS — L89513 Pressure ulcer of right ankle, stage 3: Secondary | ICD-10-CM | POA: Diagnosis not present

## 2022-04-19 DIAGNOSIS — L89213 Pressure ulcer of right hip, stage 3: Secondary | ICD-10-CM | POA: Diagnosis not present

## 2022-04-20 DIAGNOSIS — G300 Alzheimer's disease with early onset: Secondary | ICD-10-CM | POA: Diagnosis not present

## 2022-04-20 DIAGNOSIS — M6281 Muscle weakness (generalized): Secondary | ICD-10-CM | POA: Diagnosis not present

## 2022-04-20 DIAGNOSIS — E440 Moderate protein-calorie malnutrition: Secondary | ICD-10-CM | POA: Diagnosis not present

## 2022-04-20 DIAGNOSIS — F03B3 Unspecified dementia, moderate, with mood disturbance: Secondary | ICD-10-CM | POA: Diagnosis not present

## 2022-04-20 DIAGNOSIS — R293 Abnormal posture: Secondary | ICD-10-CM | POA: Diagnosis not present

## 2022-04-20 DIAGNOSIS — L89892 Pressure ulcer of other site, stage 2: Secondary | ICD-10-CM | POA: Diagnosis not present

## 2022-04-20 DIAGNOSIS — R5381 Other malaise: Secondary | ICD-10-CM | POA: Diagnosis not present

## 2022-04-20 DIAGNOSIS — Z741 Need for assistance with personal care: Secondary | ICD-10-CM | POA: Diagnosis not present

## 2022-04-21 DIAGNOSIS — M6281 Muscle weakness (generalized): Secondary | ICD-10-CM | POA: Diagnosis not present

## 2022-04-21 DIAGNOSIS — F03B3 Unspecified dementia, moderate, with mood disturbance: Secondary | ICD-10-CM | POA: Diagnosis not present

## 2022-04-21 DIAGNOSIS — R293 Abnormal posture: Secondary | ICD-10-CM | POA: Diagnosis not present

## 2022-04-21 DIAGNOSIS — Z741 Need for assistance with personal care: Secondary | ICD-10-CM | POA: Diagnosis not present

## 2022-04-24 DIAGNOSIS — M6281 Muscle weakness (generalized): Secondary | ICD-10-CM | POA: Diagnosis not present

## 2022-04-24 DIAGNOSIS — F03B3 Unspecified dementia, moderate, with mood disturbance: Secondary | ICD-10-CM | POA: Diagnosis not present

## 2022-04-24 DIAGNOSIS — Z741 Need for assistance with personal care: Secondary | ICD-10-CM | POA: Diagnosis not present

## 2022-04-24 DIAGNOSIS — R293 Abnormal posture: Secondary | ICD-10-CM | POA: Diagnosis not present

## 2022-04-25 DIAGNOSIS — Z741 Need for assistance with personal care: Secondary | ICD-10-CM | POA: Diagnosis not present

## 2022-04-25 DIAGNOSIS — R293 Abnormal posture: Secondary | ICD-10-CM | POA: Diagnosis not present

## 2022-04-25 DIAGNOSIS — F03B3 Unspecified dementia, moderate, with mood disturbance: Secondary | ICD-10-CM | POA: Diagnosis not present

## 2022-04-25 DIAGNOSIS — M6281 Muscle weakness (generalized): Secondary | ICD-10-CM | POA: Diagnosis not present

## 2022-04-26 DIAGNOSIS — L89202 Pressure ulcer of unspecified hip, stage 2: Secondary | ICD-10-CM | POA: Diagnosis not present

## 2022-04-26 DIAGNOSIS — E441 Mild protein-calorie malnutrition: Secondary | ICD-10-CM | POA: Diagnosis not present

## 2022-04-26 DIAGNOSIS — F03B3 Unspecified dementia, moderate, with mood disturbance: Secondary | ICD-10-CM | POA: Diagnosis not present

## 2022-04-26 DIAGNOSIS — R239 Unspecified skin changes: Secondary | ICD-10-CM | POA: Diagnosis not present

## 2022-04-26 DIAGNOSIS — Z741 Need for assistance with personal care: Secondary | ICD-10-CM | POA: Diagnosis not present

## 2022-04-26 DIAGNOSIS — R627 Adult failure to thrive: Secondary | ICD-10-CM | POA: Diagnosis not present

## 2022-04-26 DIAGNOSIS — E785 Hyperlipidemia, unspecified: Secondary | ICD-10-CM | POA: Diagnosis not present

## 2022-04-26 DIAGNOSIS — I10 Essential (primary) hypertension: Secondary | ICD-10-CM | POA: Diagnosis not present

## 2022-04-26 DIAGNOSIS — M6281 Muscle weakness (generalized): Secondary | ICD-10-CM | POA: Diagnosis not present

## 2022-04-26 DIAGNOSIS — L89893 Pressure ulcer of other site, stage 3: Secondary | ICD-10-CM | POA: Diagnosis not present

## 2022-04-26 DIAGNOSIS — S70211A Abrasion, right hip, initial encounter: Secondary | ICD-10-CM | POA: Diagnosis not present

## 2022-04-26 DIAGNOSIS — R293 Abnormal posture: Secondary | ICD-10-CM | POA: Diagnosis not present

## 2022-04-26 DIAGNOSIS — J45909 Unspecified asthma, uncomplicated: Secondary | ICD-10-CM | POA: Diagnosis not present

## 2022-04-26 DIAGNOSIS — G300 Alzheimer's disease with early onset: Secondary | ICD-10-CM | POA: Diagnosis not present

## 2022-04-27 DIAGNOSIS — F03B3 Unspecified dementia, moderate, with mood disturbance: Secondary | ICD-10-CM | POA: Diagnosis not present

## 2022-04-27 DIAGNOSIS — M6281 Muscle weakness (generalized): Secondary | ICD-10-CM | POA: Diagnosis not present

## 2022-04-27 DIAGNOSIS — R293 Abnormal posture: Secondary | ICD-10-CM | POA: Diagnosis not present

## 2022-04-27 DIAGNOSIS — Z741 Need for assistance with personal care: Secondary | ICD-10-CM | POA: Diagnosis not present

## 2022-04-28 DIAGNOSIS — Z741 Need for assistance with personal care: Secondary | ICD-10-CM | POA: Diagnosis not present

## 2022-04-28 DIAGNOSIS — R293 Abnormal posture: Secondary | ICD-10-CM | POA: Diagnosis not present

## 2022-04-28 DIAGNOSIS — M6281 Muscle weakness (generalized): Secondary | ICD-10-CM | POA: Diagnosis not present

## 2022-04-28 DIAGNOSIS — F03B3 Unspecified dementia, moderate, with mood disturbance: Secondary | ICD-10-CM | POA: Diagnosis not present

## 2022-05-01 DIAGNOSIS — R293 Abnormal posture: Secondary | ICD-10-CM | POA: Diagnosis not present

## 2022-05-01 DIAGNOSIS — Z741 Need for assistance with personal care: Secondary | ICD-10-CM | POA: Diagnosis not present

## 2022-05-01 DIAGNOSIS — G300 Alzheimer's disease with early onset: Secondary | ICD-10-CM | POA: Diagnosis not present

## 2022-05-01 DIAGNOSIS — F03B3 Unspecified dementia, moderate, with mood disturbance: Secondary | ICD-10-CM | POA: Diagnosis not present

## 2022-05-01 DIAGNOSIS — L89202 Pressure ulcer of unspecified hip, stage 2: Secondary | ICD-10-CM | POA: Diagnosis not present

## 2022-05-01 DIAGNOSIS — E440 Moderate protein-calorie malnutrition: Secondary | ICD-10-CM | POA: Diagnosis not present

## 2022-05-01 DIAGNOSIS — M6281 Muscle weakness (generalized): Secondary | ICD-10-CM | POA: Diagnosis not present

## 2022-05-02 DIAGNOSIS — M6281 Muscle weakness (generalized): Secondary | ICD-10-CM | POA: Diagnosis not present

## 2022-05-02 DIAGNOSIS — R293 Abnormal posture: Secondary | ICD-10-CM | POA: Diagnosis not present

## 2022-05-02 DIAGNOSIS — Z741 Need for assistance with personal care: Secondary | ICD-10-CM | POA: Diagnosis not present

## 2022-05-02 DIAGNOSIS — G309 Alzheimer's disease, unspecified: Secondary | ICD-10-CM | POA: Diagnosis not present

## 2022-05-02 DIAGNOSIS — L89893 Pressure ulcer of other site, stage 3: Secondary | ICD-10-CM | POA: Diagnosis not present

## 2022-05-02 DIAGNOSIS — J4520 Mild intermittent asthma, uncomplicated: Secondary | ICD-10-CM | POA: Diagnosis not present

## 2022-05-02 DIAGNOSIS — F03B3 Unspecified dementia, moderate, with mood disturbance: Secondary | ICD-10-CM | POA: Diagnosis not present

## 2022-05-03 DIAGNOSIS — Z741 Need for assistance with personal care: Secondary | ICD-10-CM | POA: Diagnosis not present

## 2022-05-03 DIAGNOSIS — M6281 Muscle weakness (generalized): Secondary | ICD-10-CM | POA: Diagnosis not present

## 2022-05-03 DIAGNOSIS — S70211A Abrasion, right hip, initial encounter: Secondary | ICD-10-CM | POA: Diagnosis not present

## 2022-05-03 DIAGNOSIS — F03B3 Unspecified dementia, moderate, with mood disturbance: Secondary | ICD-10-CM | POA: Diagnosis not present

## 2022-05-03 DIAGNOSIS — R627 Adult failure to thrive: Secondary | ICD-10-CM | POA: Diagnosis not present

## 2022-05-03 DIAGNOSIS — L89893 Pressure ulcer of other site, stage 3: Secondary | ICD-10-CM | POA: Diagnosis not present

## 2022-05-03 DIAGNOSIS — R239 Unspecified skin changes: Secondary | ICD-10-CM | POA: Diagnosis not present

## 2022-05-03 DIAGNOSIS — R293 Abnormal posture: Secondary | ICD-10-CM | POA: Diagnosis not present

## 2022-05-03 DIAGNOSIS — E441 Mild protein-calorie malnutrition: Secondary | ICD-10-CM | POA: Diagnosis not present

## 2022-05-04 ENCOUNTER — Ambulatory Visit (INDEPENDENT_AMBULATORY_CARE_PROVIDER_SITE_OTHER): Payer: 59 | Admitting: Podiatry

## 2022-05-04 DIAGNOSIS — L97519 Non-pressure chronic ulcer of other part of right foot with unspecified severity: Secondary | ICD-10-CM

## 2022-05-04 DIAGNOSIS — R293 Abnormal posture: Secondary | ICD-10-CM | POA: Diagnosis not present

## 2022-05-04 DIAGNOSIS — Z741 Need for assistance with personal care: Secondary | ICD-10-CM | POA: Diagnosis not present

## 2022-05-04 DIAGNOSIS — M6281 Muscle weakness (generalized): Secondary | ICD-10-CM | POA: Diagnosis not present

## 2022-05-04 DIAGNOSIS — F03B3 Unspecified dementia, moderate, with mood disturbance: Secondary | ICD-10-CM | POA: Diagnosis not present

## 2022-05-04 NOTE — Progress Notes (Signed)
Subjective:   Patient ID: Sherry Hicks, female   DOB: 62 y.o.   MRN: [id number]   HPI Patient presents with caregiver with no communication skill and not sure as to why they are here with the assumption that it has to do with nail disease.  Patient's does not appear to smoke has had a stroke and contracture   Review of Systems  All other systems reviewed and are negative.       Objective:  Physical Exam Vitals and nursing note reviewed.  Constitutional:      Appearance: She is well-developed.  Pulmonary:     Effort: Pulmonary effort is normal.  Musculoskeletal:        General: Normal range of motion.  Skin:    General: Skin is warm.  Neurological:     Mental Status: She is alert.     Neurovascular status difficult to ascertain significant contracture no ability to move the limbs with no muscle strength I was able to evaluate.  There is a dressing on the right first metatarsal it appears to be stable I did check it and found it to be superficial I did not note any deepness and I did not note nail disease currently     Assessment:  Localized ulceration right that is being treated by wound care nurse that appears to be doing well with severe mental condition and stroke     Plan:  Reviewed condition with caregiver do not recommend treatment except what she is doing they will continue with the same treatment plan with wound care nurse and patient will be seen back if any worsening of condition were to occur

## 2022-05-05 DIAGNOSIS — F03B3 Unspecified dementia, moderate, with mood disturbance: Secondary | ICD-10-CM | POA: Diagnosis not present

## 2022-05-05 DIAGNOSIS — R293 Abnormal posture: Secondary | ICD-10-CM | POA: Diagnosis not present

## 2022-05-05 DIAGNOSIS — M6281 Muscle weakness (generalized): Secondary | ICD-10-CM | POA: Diagnosis not present

## 2022-05-05 DIAGNOSIS — Z741 Need for assistance with personal care: Secondary | ICD-10-CM | POA: Diagnosis not present

## 2022-05-06 IMAGING — CT CT HEAD W/O CM
3 series · 14 of 47 positions shown, 16 images · non-contrast
Comparison: 11/18/2018 prior CTs

CLINICAL DATA: 60-year-old female with head and neck injury from
fall today. History of dementia.

EXAM:
CT HEAD WITHOUT CONTRAST
CT CERVICAL SPINE WITHOUT CONTRAST
TECHNIQUE: Multidetector CT imaging of the head and cervical spine was
performed following the standard protocol without intravenous
contrast. Multiplanar CT image reconstructions of the cervical spine
were also generated.

[Series 3: head wo · axial · 0.48mm/px · z∈[-409,-269]mm · 8 of 34 slices shown, 10 images]
[im 3/34  brain]
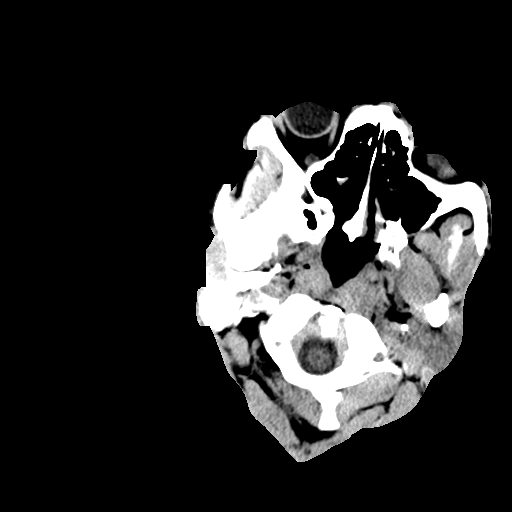
[im 3/34  bone]
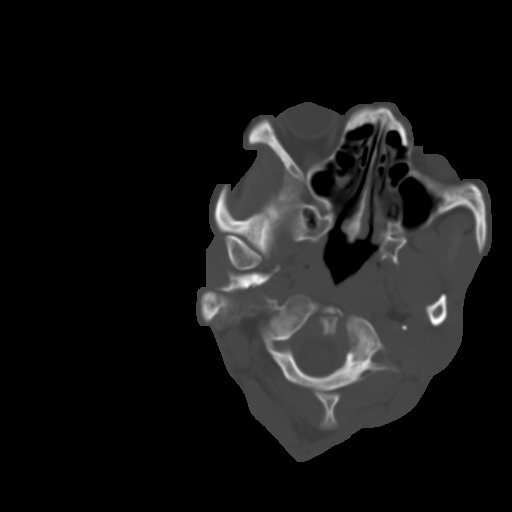
[im 7/34  brain]
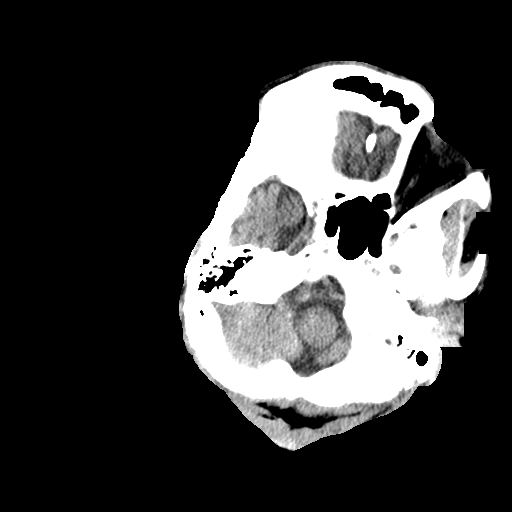
[im 11/34  brain]
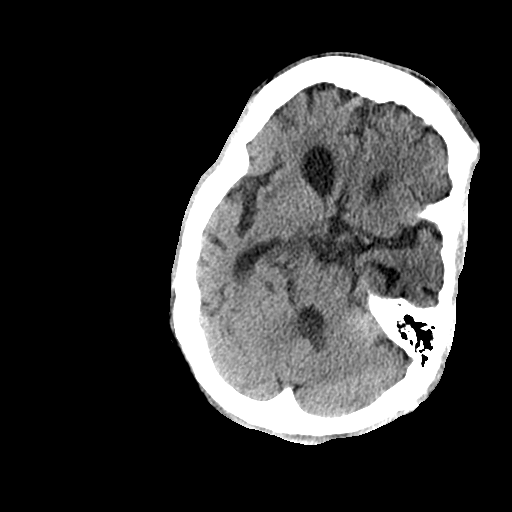
[im 15/34  brain]
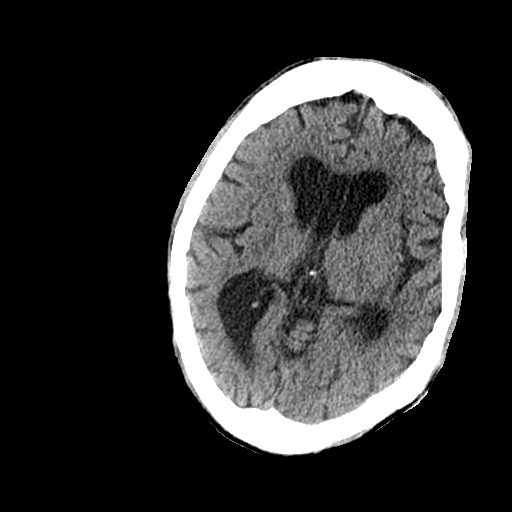
[im 19/34  brain]
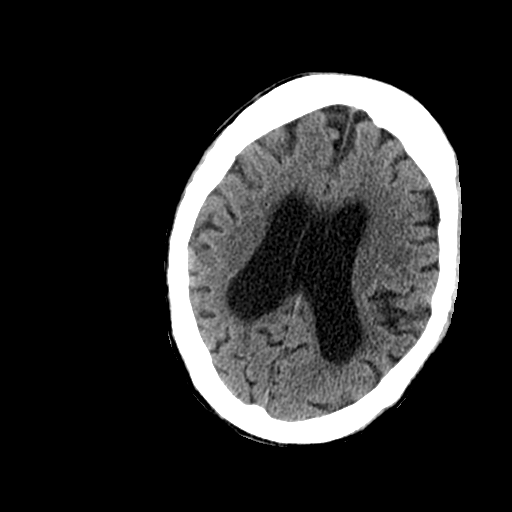
[im 19/34  bone]
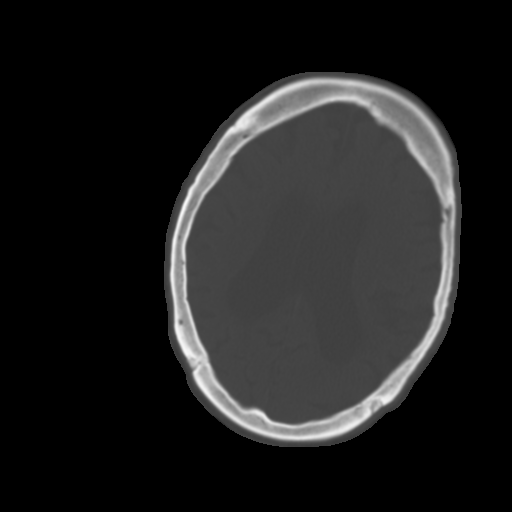
[im 23/34  brain]
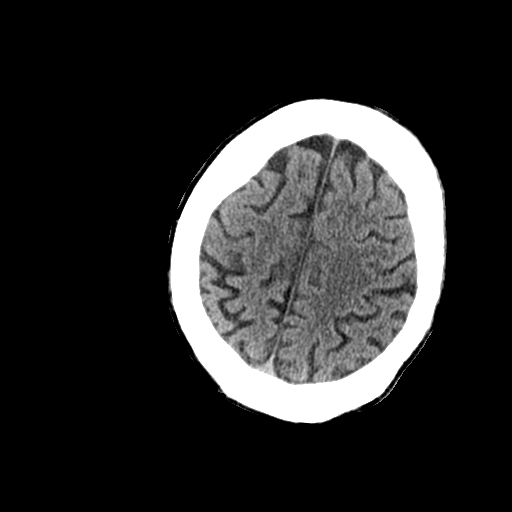
[im 27/34  brain]
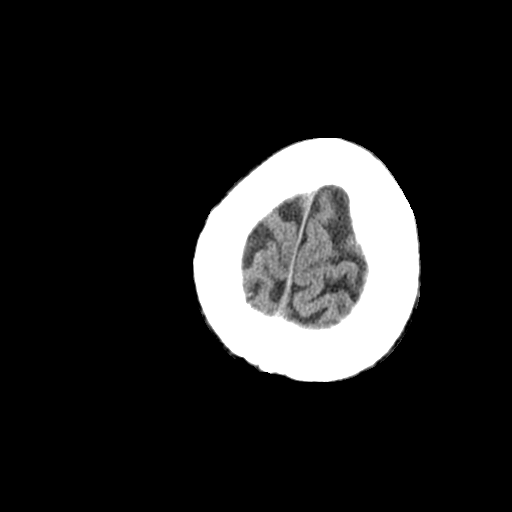
[im 31/34  brain]
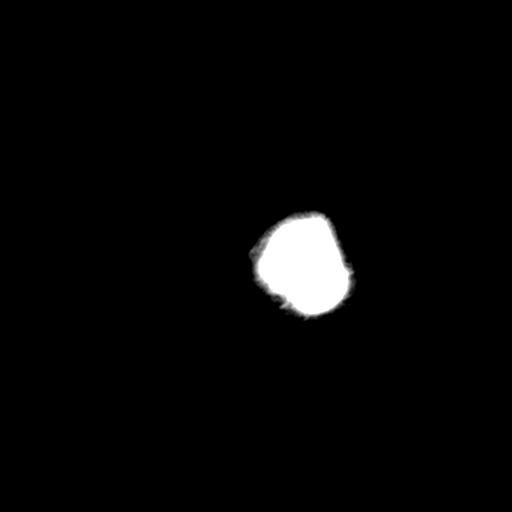

[Series 6: coronal soft tissue · coronal · 0.35mm/px · 3 of 75 slices shown]
[im 25/75  brain]
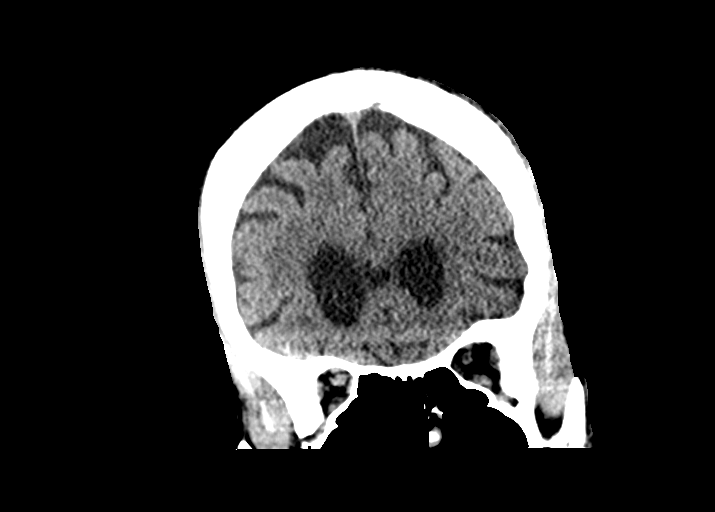
[im 33/75  brain]
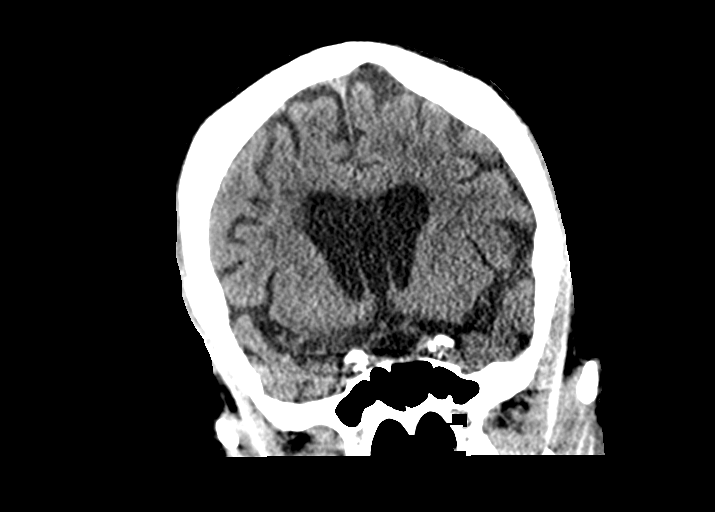
[im 42/75  brain]
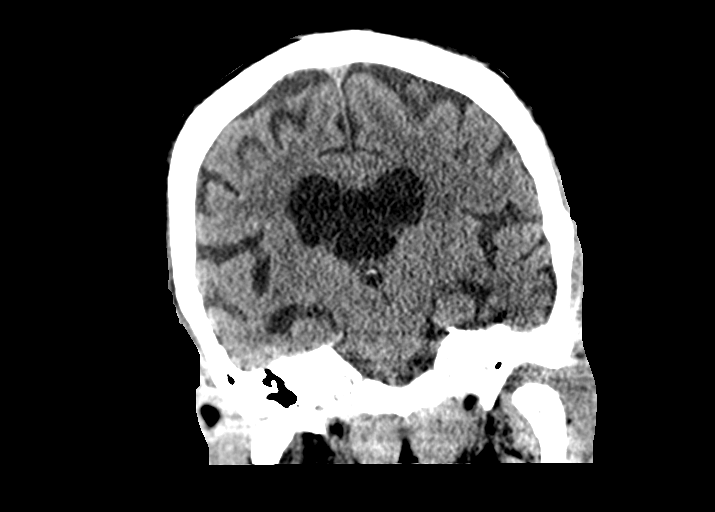

[Series 7: sagittal soft tissue · sagittal · 0.35mm/px · 3 of 60 slices shown]
[im 20/60  brain]
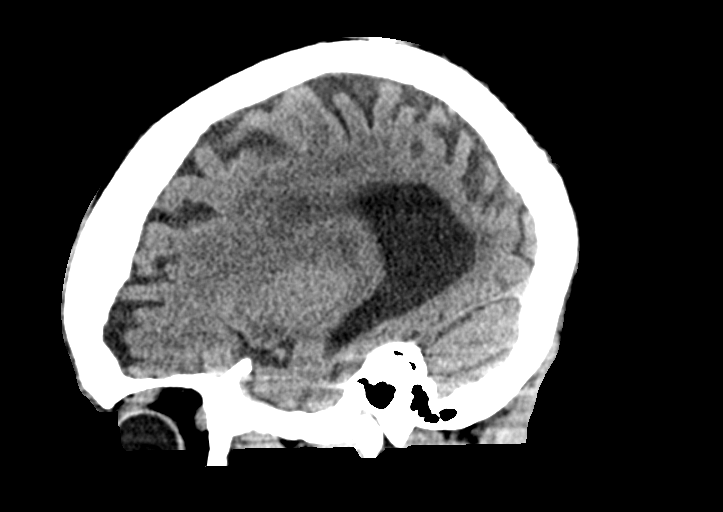
[im 30/60  brain]
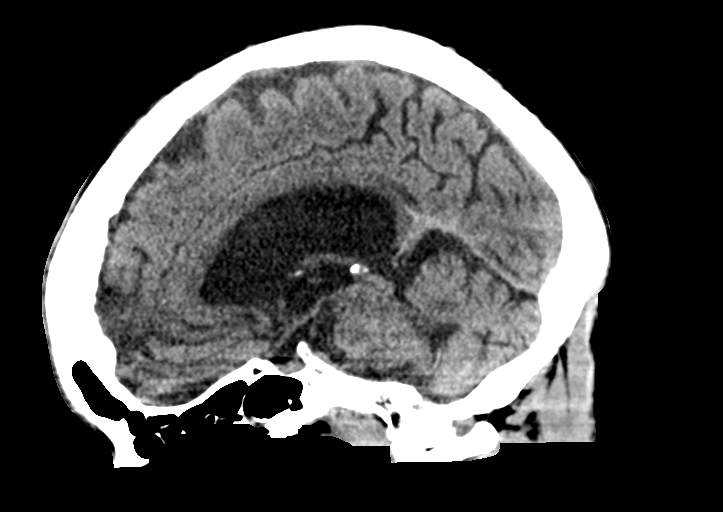
[im 40/60  brain]
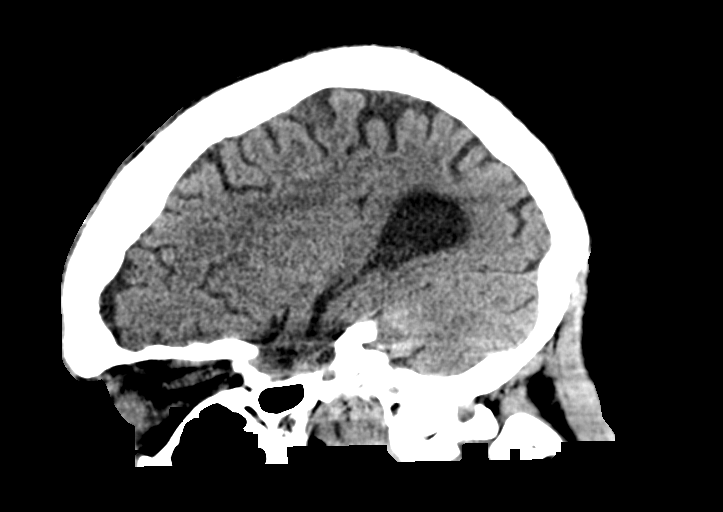

[14 of 47 positions shown; findings below may reference images not displayed]

FINDINGS: CT HEAD FINDINGS

Brain: No evidence of acute infarction, hemorrhage, hydrocephalus,
extra-axial collection or mass lesion/mass effect.

Atrophy and probable chronic small-vessel white matter ischemic
changes are again noted. Ventriculomegaly is not significantly
changed from 2222.

Vascular: No hyperdense vessel or unexpected calcification.

Skull: Normal. Negative for fracture or focal lesion.

Sinuses/Orbits: No acute finding.

Other: None

CT CERVICAL SPINE FINDINGS

Alignment: Normal.

Skull base and vertebrae: No acute fracture. No primary bone lesion
or focal pathologic process.

Soft tissues and spinal canal: No prevertebral fluid or swelling. No
visible canal hematoma.

Disc levels:  Unremarkable

Upper chest: Negative.

Other: None
IMPRESSION: 1. No evidence of acute intracranial abnormality. Atrophy and
probable chronic small-vessel white matter ischemic changes.
2. No static evidence of acute injury to the cervical spine.

## 2022-05-09 DIAGNOSIS — J4520 Mild intermittent asthma, uncomplicated: Secondary | ICD-10-CM | POA: Diagnosis not present

## 2022-05-10 DIAGNOSIS — M6281 Muscle weakness (generalized): Secondary | ICD-10-CM | POA: Diagnosis not present

## 2022-05-10 DIAGNOSIS — R239 Unspecified skin changes: Secondary | ICD-10-CM | POA: Diagnosis not present

## 2022-05-10 DIAGNOSIS — L89893 Pressure ulcer of other site, stage 3: Secondary | ICD-10-CM | POA: Diagnosis not present

## 2022-05-10 DIAGNOSIS — S70211A Abrasion, right hip, initial encounter: Secondary | ICD-10-CM | POA: Diagnosis not present

## 2022-05-10 DIAGNOSIS — E441 Mild protein-calorie malnutrition: Secondary | ICD-10-CM | POA: Diagnosis not present

## 2022-05-10 DIAGNOSIS — R627 Adult failure to thrive: Secondary | ICD-10-CM | POA: Diagnosis not present

## 2022-05-17 DIAGNOSIS — L89893 Pressure ulcer of other site, stage 3: Secondary | ICD-10-CM | POA: Diagnosis not present

## 2022-05-17 DIAGNOSIS — E441 Mild protein-calorie malnutrition: Secondary | ICD-10-CM | POA: Diagnosis not present

## 2022-05-17 DIAGNOSIS — S70211D Abrasion, right hip, subsequent encounter: Secondary | ICD-10-CM | POA: Diagnosis not present

## 2022-05-17 DIAGNOSIS — M6281 Muscle weakness (generalized): Secondary | ICD-10-CM | POA: Diagnosis not present

## 2022-05-17 DIAGNOSIS — R627 Adult failure to thrive: Secondary | ICD-10-CM | POA: Diagnosis not present

## 2022-05-17 DIAGNOSIS — R239 Unspecified skin changes: Secondary | ICD-10-CM | POA: Diagnosis not present

## 2022-05-24 DIAGNOSIS — S70211D Abrasion, right hip, subsequent encounter: Secondary | ICD-10-CM | POA: Diagnosis not present

## 2022-05-24 DIAGNOSIS — R627 Adult failure to thrive: Secondary | ICD-10-CM | POA: Diagnosis not present

## 2022-05-24 DIAGNOSIS — R239 Unspecified skin changes: Secondary | ICD-10-CM | POA: Diagnosis not present

## 2022-05-24 DIAGNOSIS — E441 Mild protein-calorie malnutrition: Secondary | ICD-10-CM | POA: Diagnosis not present

## 2022-05-24 DIAGNOSIS — M6281 Muscle weakness (generalized): Secondary | ICD-10-CM | POA: Diagnosis not present

## 2022-05-24 DIAGNOSIS — L89893 Pressure ulcer of other site, stage 3: Secondary | ICD-10-CM | POA: Diagnosis not present

## 2022-05-29 DIAGNOSIS — F03B3 Unspecified dementia, moderate, with mood disturbance: Secondary | ICD-10-CM | POA: Diagnosis not present

## 2022-05-30 DIAGNOSIS — L89893 Pressure ulcer of other site, stage 3: Secondary | ICD-10-CM | POA: Diagnosis not present

## 2022-05-31 DIAGNOSIS — S70211D Abrasion, right hip, subsequent encounter: Secondary | ICD-10-CM | POA: Diagnosis not present

## 2022-05-31 DIAGNOSIS — L89893 Pressure ulcer of other site, stage 3: Secondary | ICD-10-CM | POA: Diagnosis not present

## 2022-05-31 DIAGNOSIS — E441 Mild protein-calorie malnutrition: Secondary | ICD-10-CM | POA: Diagnosis not present

## 2022-05-31 DIAGNOSIS — R627 Adult failure to thrive: Secondary | ICD-10-CM | POA: Diagnosis not present

## 2022-05-31 DIAGNOSIS — R239 Unspecified skin changes: Secondary | ICD-10-CM | POA: Diagnosis not present

## 2022-05-31 DIAGNOSIS — M6281 Muscle weakness (generalized): Secondary | ICD-10-CM | POA: Diagnosis not present

## 2022-06-05 DIAGNOSIS — R627 Adult failure to thrive: Secondary | ICD-10-CM | POA: Diagnosis not present

## 2022-06-05 DIAGNOSIS — L89893 Pressure ulcer of other site, stage 3: Secondary | ICD-10-CM | POA: Diagnosis not present

## 2022-06-05 DIAGNOSIS — E440 Moderate protein-calorie malnutrition: Secondary | ICD-10-CM | POA: Diagnosis not present

## 2022-06-05 DIAGNOSIS — R5381 Other malaise: Secondary | ICD-10-CM | POA: Diagnosis not present

## 2022-06-07 DIAGNOSIS — M6281 Muscle weakness (generalized): Secondary | ICD-10-CM | POA: Diagnosis not present

## 2022-06-07 DIAGNOSIS — R627 Adult failure to thrive: Secondary | ICD-10-CM | POA: Diagnosis not present

## 2022-06-07 DIAGNOSIS — R239 Unspecified skin changes: Secondary | ICD-10-CM | POA: Diagnosis not present

## 2022-06-07 DIAGNOSIS — E441 Mild protein-calorie malnutrition: Secondary | ICD-10-CM | POA: Diagnosis not present

## 2022-06-07 DIAGNOSIS — L89893 Pressure ulcer of other site, stage 3: Secondary | ICD-10-CM | POA: Diagnosis not present

## 2022-06-07 DIAGNOSIS — S70211D Abrasion, right hip, subsequent encounter: Secondary | ICD-10-CM | POA: Diagnosis not present

## 2022-06-13 DIAGNOSIS — J45909 Unspecified asthma, uncomplicated: Secondary | ICD-10-CM | POA: Diagnosis not present

## 2022-06-13 DIAGNOSIS — G300 Alzheimer's disease with early onset: Secondary | ICD-10-CM | POA: Diagnosis not present

## 2022-06-13 DIAGNOSIS — G8929 Other chronic pain: Secondary | ICD-10-CM | POA: Diagnosis not present

## 2022-06-13 DIAGNOSIS — I10 Essential (primary) hypertension: Secondary | ICD-10-CM | POA: Diagnosis not present

## 2022-06-13 DIAGNOSIS — E785 Hyperlipidemia, unspecified: Secondary | ICD-10-CM | POA: Diagnosis not present

## 2022-06-14 DIAGNOSIS — R239 Unspecified skin changes: Secondary | ICD-10-CM | POA: Diagnosis not present

## 2022-06-14 DIAGNOSIS — E441 Mild protein-calorie malnutrition: Secondary | ICD-10-CM | POA: Diagnosis not present

## 2022-06-14 DIAGNOSIS — R627 Adult failure to thrive: Secondary | ICD-10-CM | POA: Diagnosis not present

## 2022-06-14 DIAGNOSIS — M6281 Muscle weakness (generalized): Secondary | ICD-10-CM | POA: Diagnosis not present

## 2022-06-14 DIAGNOSIS — S70211D Abrasion, right hip, subsequent encounter: Secondary | ICD-10-CM | POA: Diagnosis not present

## 2022-06-14 DIAGNOSIS — L89893 Pressure ulcer of other site, stage 3: Secondary | ICD-10-CM | POA: Diagnosis not present

## 2022-06-21 DIAGNOSIS — S70211D Abrasion, right hip, subsequent encounter: Secondary | ICD-10-CM | POA: Diagnosis not present

## 2022-06-21 DIAGNOSIS — R627 Adult failure to thrive: Secondary | ICD-10-CM | POA: Diagnosis not present

## 2022-06-21 DIAGNOSIS — E441 Mild protein-calorie malnutrition: Secondary | ICD-10-CM | POA: Diagnosis not present

## 2022-06-21 DIAGNOSIS — L89893 Pressure ulcer of other site, stage 3: Secondary | ICD-10-CM | POA: Diagnosis not present

## 2022-06-21 DIAGNOSIS — R239 Unspecified skin changes: Secondary | ICD-10-CM | POA: Diagnosis not present

## 2022-06-21 DIAGNOSIS — M6281 Muscle weakness (generalized): Secondary | ICD-10-CM | POA: Diagnosis not present

## 2022-06-29 DIAGNOSIS — R5381 Other malaise: Secondary | ICD-10-CM | POA: Diagnosis not present

## 2022-06-29 DIAGNOSIS — L89893 Pressure ulcer of other site, stage 3: Secondary | ICD-10-CM | POA: Diagnosis not present

## 2022-06-29 DIAGNOSIS — E440 Moderate protein-calorie malnutrition: Secondary | ICD-10-CM | POA: Diagnosis not present

## 2022-06-29 DIAGNOSIS — R627 Adult failure to thrive: Secondary | ICD-10-CM | POA: Diagnosis not present

## 2022-07-03 DIAGNOSIS — G300 Alzheimer's disease with early onset: Secondary | ICD-10-CM | POA: Diagnosis not present

## 2022-07-03 DIAGNOSIS — E440 Moderate protein-calorie malnutrition: Secondary | ICD-10-CM | POA: Diagnosis not present

## 2022-07-03 DIAGNOSIS — R5381 Other malaise: Secondary | ICD-10-CM | POA: Diagnosis not present

## 2022-07-05 DIAGNOSIS — I10 Essential (primary) hypertension: Secondary | ICD-10-CM | POA: Diagnosis not present

## 2022-07-05 DIAGNOSIS — E785 Hyperlipidemia, unspecified: Secondary | ICD-10-CM | POA: Diagnosis not present

## 2022-07-05 DIAGNOSIS — G300 Alzheimer's disease with early onset: Secondary | ICD-10-CM | POA: Diagnosis not present

## 2022-07-05 DIAGNOSIS — J45909 Unspecified asthma, uncomplicated: Secondary | ICD-10-CM | POA: Diagnosis not present

## 2022-07-05 DIAGNOSIS — G8929 Other chronic pain: Secondary | ICD-10-CM | POA: Diagnosis not present

## 2022-07-26 DIAGNOSIS — E559 Vitamin D deficiency, unspecified: Secondary | ICD-10-CM | POA: Diagnosis not present

## 2022-07-26 DIAGNOSIS — I10 Essential (primary) hypertension: Secondary | ICD-10-CM | POA: Diagnosis not present

## 2022-07-26 DIAGNOSIS — D649 Anemia, unspecified: Secondary | ICD-10-CM | POA: Diagnosis not present

## 2022-07-26 DIAGNOSIS — E1159 Type 2 diabetes mellitus with other circulatory complications: Secondary | ICD-10-CM | POA: Diagnosis not present

## 2022-07-27 DIAGNOSIS — M6281 Muscle weakness (generalized): Secondary | ICD-10-CM | POA: Diagnosis not present

## 2022-07-27 DIAGNOSIS — G300 Alzheimer's disease with early onset: Secondary | ICD-10-CM | POA: Diagnosis not present

## 2022-07-27 DIAGNOSIS — E440 Moderate protein-calorie malnutrition: Secondary | ICD-10-CM | POA: Diagnosis not present

## 2022-07-27 DIAGNOSIS — F03B3 Unspecified dementia, moderate, with mood disturbance: Secondary | ICD-10-CM | POA: Diagnosis not present

## 2022-07-27 DIAGNOSIS — R5381 Other malaise: Secondary | ICD-10-CM | POA: Diagnosis not present

## 2022-07-28 DIAGNOSIS — F03B3 Unspecified dementia, moderate, with mood disturbance: Secondary | ICD-10-CM | POA: Diagnosis not present

## 2022-07-28 DIAGNOSIS — M6281 Muscle weakness (generalized): Secondary | ICD-10-CM | POA: Diagnosis not present

## 2022-07-31 DIAGNOSIS — M6281 Muscle weakness (generalized): Secondary | ICD-10-CM | POA: Diagnosis not present

## 2022-07-31 DIAGNOSIS — F03B3 Unspecified dementia, moderate, with mood disturbance: Secondary | ICD-10-CM | POA: Diagnosis not present

## 2022-08-01 DIAGNOSIS — M6281 Muscle weakness (generalized): Secondary | ICD-10-CM | POA: Diagnosis not present

## 2022-08-01 DIAGNOSIS — F03B3 Unspecified dementia, moderate, with mood disturbance: Secondary | ICD-10-CM | POA: Diagnosis not present

## 2022-08-02 DIAGNOSIS — M6281 Muscle weakness (generalized): Secondary | ICD-10-CM | POA: Diagnosis not present

## 2022-08-02 DIAGNOSIS — G300 Alzheimer's disease with early onset: Secondary | ICD-10-CM | POA: Diagnosis not present

## 2022-08-02 DIAGNOSIS — F03B3 Unspecified dementia, moderate, with mood disturbance: Secondary | ICD-10-CM | POA: Diagnosis not present

## 2022-08-02 DIAGNOSIS — R296 Repeated falls: Secondary | ICD-10-CM | POA: Diagnosis not present

## 2022-08-03 DIAGNOSIS — F03B3 Unspecified dementia, moderate, with mood disturbance: Secondary | ICD-10-CM | POA: Diagnosis not present

## 2022-08-03 DIAGNOSIS — M6281 Muscle weakness (generalized): Secondary | ICD-10-CM | POA: Diagnosis not present

## 2022-08-05 DIAGNOSIS — F03B3 Unspecified dementia, moderate, with mood disturbance: Secondary | ICD-10-CM | POA: Diagnosis not present

## 2022-08-05 DIAGNOSIS — M6281 Muscle weakness (generalized): Secondary | ICD-10-CM | POA: Diagnosis not present

## 2022-08-06 DIAGNOSIS — F03B3 Unspecified dementia, moderate, with mood disturbance: Secondary | ICD-10-CM | POA: Diagnosis not present

## 2022-08-06 DIAGNOSIS — M6281 Muscle weakness (generalized): Secondary | ICD-10-CM | POA: Diagnosis not present

## 2022-08-07 DIAGNOSIS — F03B3 Unspecified dementia, moderate, with mood disturbance: Secondary | ICD-10-CM | POA: Diagnosis not present

## 2022-08-07 DIAGNOSIS — M6281 Muscle weakness (generalized): Secondary | ICD-10-CM | POA: Diagnosis not present

## 2022-08-09 DIAGNOSIS — G8929 Other chronic pain: Secondary | ICD-10-CM | POA: Diagnosis not present

## 2022-08-09 DIAGNOSIS — E785 Hyperlipidemia, unspecified: Secondary | ICD-10-CM | POA: Diagnosis not present

## 2022-08-09 DIAGNOSIS — G300 Alzheimer's disease with early onset: Secondary | ICD-10-CM | POA: Diagnosis not present

## 2022-08-09 DIAGNOSIS — J45909 Unspecified asthma, uncomplicated: Secondary | ICD-10-CM | POA: Diagnosis not present

## 2022-08-09 DIAGNOSIS — F03B3 Unspecified dementia, moderate, with mood disturbance: Secondary | ICD-10-CM | POA: Diagnosis not present

## 2022-08-09 DIAGNOSIS — I10 Essential (primary) hypertension: Secondary | ICD-10-CM | POA: Diagnosis not present

## 2022-08-09 DIAGNOSIS — M6281 Muscle weakness (generalized): Secondary | ICD-10-CM | POA: Diagnosis not present

## 2022-08-10 DIAGNOSIS — M6281 Muscle weakness (generalized): Secondary | ICD-10-CM | POA: Diagnosis not present

## 2022-08-10 DIAGNOSIS — F03B3 Unspecified dementia, moderate, with mood disturbance: Secondary | ICD-10-CM | POA: Diagnosis not present

## 2022-08-12 DIAGNOSIS — F03B3 Unspecified dementia, moderate, with mood disturbance: Secondary | ICD-10-CM | POA: Diagnosis not present

## 2022-08-12 DIAGNOSIS — M6281 Muscle weakness (generalized): Secondary | ICD-10-CM | POA: Diagnosis not present

## 2022-08-13 DIAGNOSIS — F03B3 Unspecified dementia, moderate, with mood disturbance: Secondary | ICD-10-CM | POA: Diagnosis not present

## 2022-08-13 DIAGNOSIS — M6281 Muscle weakness (generalized): Secondary | ICD-10-CM | POA: Diagnosis not present

## 2022-08-14 DIAGNOSIS — F03B3 Unspecified dementia, moderate, with mood disturbance: Secondary | ICD-10-CM | POA: Diagnosis not present

## 2022-08-14 DIAGNOSIS — M6281 Muscle weakness (generalized): Secondary | ICD-10-CM | POA: Diagnosis not present

## 2022-08-15 DIAGNOSIS — F03B3 Unspecified dementia, moderate, with mood disturbance: Secondary | ICD-10-CM | POA: Diagnosis not present

## 2022-08-15 DIAGNOSIS — M6281 Muscle weakness (generalized): Secondary | ICD-10-CM | POA: Diagnosis not present

## 2022-08-16 DIAGNOSIS — F03B3 Unspecified dementia, moderate, with mood disturbance: Secondary | ICD-10-CM | POA: Diagnosis not present

## 2022-08-16 DIAGNOSIS — M6281 Muscle weakness (generalized): Secondary | ICD-10-CM | POA: Diagnosis not present

## 2022-08-18 DIAGNOSIS — M6281 Muscle weakness (generalized): Secondary | ICD-10-CM | POA: Diagnosis not present

## 2022-08-18 DIAGNOSIS — L89892 Pressure ulcer of other site, stage 2: Secondary | ICD-10-CM | POA: Diagnosis not present

## 2022-08-18 DIAGNOSIS — G300 Alzheimer's disease with early onset: Secondary | ICD-10-CM | POA: Diagnosis not present

## 2022-08-18 DIAGNOSIS — R5381 Other malaise: Secondary | ICD-10-CM | POA: Diagnosis not present

## 2022-08-18 DIAGNOSIS — E440 Moderate protein-calorie malnutrition: Secondary | ICD-10-CM | POA: Diagnosis not present

## 2022-08-18 DIAGNOSIS — F03B3 Unspecified dementia, moderate, with mood disturbance: Secondary | ICD-10-CM | POA: Diagnosis not present

## 2022-08-19 DIAGNOSIS — M6281 Muscle weakness (generalized): Secondary | ICD-10-CM | POA: Diagnosis not present

## 2022-08-19 DIAGNOSIS — F03B3 Unspecified dementia, moderate, with mood disturbance: Secondary | ICD-10-CM | POA: Diagnosis not present

## 2022-08-20 DIAGNOSIS — F03B3 Unspecified dementia, moderate, with mood disturbance: Secondary | ICD-10-CM | POA: Diagnosis not present

## 2022-08-20 DIAGNOSIS — M6281 Muscle weakness (generalized): Secondary | ICD-10-CM | POA: Diagnosis not present

## 2022-08-22 DIAGNOSIS — F03B3 Unspecified dementia, moderate, with mood disturbance: Secondary | ICD-10-CM | POA: Diagnosis not present

## 2022-08-22 DIAGNOSIS — M6281 Muscle weakness (generalized): Secondary | ICD-10-CM | POA: Diagnosis not present

## 2022-08-23 DIAGNOSIS — F03B3 Unspecified dementia, moderate, with mood disturbance: Secondary | ICD-10-CM | POA: Diagnosis not present

## 2022-08-23 DIAGNOSIS — M6281 Muscle weakness (generalized): Secondary | ICD-10-CM | POA: Diagnosis not present

## 2022-08-27 DIAGNOSIS — M6281 Muscle weakness (generalized): Secondary | ICD-10-CM | POA: Diagnosis not present

## 2022-08-27 DIAGNOSIS — F03B3 Unspecified dementia, moderate, with mood disturbance: Secondary | ICD-10-CM | POA: Diagnosis not present

## 2022-08-30 DIAGNOSIS — M6281 Muscle weakness (generalized): Secondary | ICD-10-CM | POA: Diagnosis not present

## 2022-08-30 DIAGNOSIS — F03B3 Unspecified dementia, moderate, with mood disturbance: Secondary | ICD-10-CM | POA: Diagnosis not present

## 2022-08-31 DIAGNOSIS — F03B3 Unspecified dementia, moderate, with mood disturbance: Secondary | ICD-10-CM | POA: Diagnosis not present

## 2022-08-31 DIAGNOSIS — M6281 Muscle weakness (generalized): Secondary | ICD-10-CM | POA: Diagnosis not present

## 2022-09-06 DIAGNOSIS — I10 Essential (primary) hypertension: Secondary | ICD-10-CM | POA: Diagnosis not present

## 2022-09-06 DIAGNOSIS — E785 Hyperlipidemia, unspecified: Secondary | ICD-10-CM | POA: Diagnosis not present

## 2022-09-06 DIAGNOSIS — J45909 Unspecified asthma, uncomplicated: Secondary | ICD-10-CM | POA: Diagnosis not present

## 2022-09-06 DIAGNOSIS — G8929 Other chronic pain: Secondary | ICD-10-CM | POA: Diagnosis not present

## 2022-09-06 DIAGNOSIS — G300 Alzheimer's disease with early onset: Secondary | ICD-10-CM | POA: Diagnosis not present

## 2022-09-14 DIAGNOSIS — E440 Moderate protein-calorie malnutrition: Secondary | ICD-10-CM | POA: Diagnosis not present

## 2022-09-14 DIAGNOSIS — R5381 Other malaise: Secondary | ICD-10-CM | POA: Diagnosis not present

## 2022-09-14 DIAGNOSIS — G300 Alzheimer's disease with early onset: Secondary | ICD-10-CM | POA: Diagnosis not present

## 2022-09-14 DIAGNOSIS — L89892 Pressure ulcer of other site, stage 2: Secondary | ICD-10-CM | POA: Diagnosis not present

## 2022-09-15 DIAGNOSIS — R5381 Other malaise: Secondary | ICD-10-CM | POA: Diagnosis not present

## 2022-10-04 DIAGNOSIS — G8929 Other chronic pain: Secondary | ICD-10-CM | POA: Diagnosis not present

## 2022-10-04 DIAGNOSIS — G300 Alzheimer's disease with early onset: Secondary | ICD-10-CM | POA: Diagnosis not present

## 2022-10-04 DIAGNOSIS — J45909 Unspecified asthma, uncomplicated: Secondary | ICD-10-CM | POA: Diagnosis not present

## 2022-10-04 DIAGNOSIS — L89202 Pressure ulcer of unspecified hip, stage 2: Secondary | ICD-10-CM | POA: Diagnosis not present

## 2022-10-04 DIAGNOSIS — I10 Essential (primary) hypertension: Secondary | ICD-10-CM | POA: Diagnosis not present

## 2022-10-04 DIAGNOSIS — E785 Hyperlipidemia, unspecified: Secondary | ICD-10-CM | POA: Diagnosis not present

## 2022-10-12 DIAGNOSIS — D649 Anemia, unspecified: Secondary | ICD-10-CM | POA: Diagnosis not present

## 2022-10-13 DIAGNOSIS — E440 Moderate protein-calorie malnutrition: Secondary | ICD-10-CM | POA: Diagnosis not present

## 2022-10-13 DIAGNOSIS — G300 Alzheimer's disease with early onset: Secondary | ICD-10-CM | POA: Diagnosis not present

## 2022-10-13 DIAGNOSIS — R5381 Other malaise: Secondary | ICD-10-CM | POA: Diagnosis not present

## 2022-10-19 DIAGNOSIS — R5381 Other malaise: Secondary | ICD-10-CM | POA: Diagnosis not present

## 2022-10-19 DIAGNOSIS — G300 Alzheimer's disease with early onset: Secondary | ICD-10-CM | POA: Diagnosis not present

## 2022-10-19 DIAGNOSIS — E440 Moderate protein-calorie malnutrition: Secondary | ICD-10-CM | POA: Diagnosis not present

## 2022-10-26 DIAGNOSIS — R296 Repeated falls: Secondary | ICD-10-CM | POA: Diagnosis not present

## 2022-10-26 DIAGNOSIS — L98429 Non-pressure chronic ulcer of back with unspecified severity: Secondary | ICD-10-CM | POA: Diagnosis not present

## 2022-10-26 DIAGNOSIS — M6281 Muscle weakness (generalized): Secondary | ICD-10-CM | POA: Diagnosis not present

## 2022-10-27 ENCOUNTER — Other Ambulatory Visit: Payer: Self-pay

## 2022-10-27 ENCOUNTER — Emergency Department (HOSPITAL_COMMUNITY)
Admission: EM | Admit: 2022-10-27 | Discharge: 2022-10-28 | Disposition: A | Discharge status: Home / Self Care | Payer: 59 | Attending: Emergency Medicine | Admitting: Emergency Medicine

## 2022-10-27 ENCOUNTER — Emergency Department (HOSPITAL_COMMUNITY): Payer: 59

## 2022-10-27 ENCOUNTER — Encounter (HOSPITAL_COMMUNITY): Payer: Self-pay | Admitting: Pharmacy Technician

## 2022-10-27 DIAGNOSIS — K5900 Constipation, unspecified: Secondary | ICD-10-CM | POA: Diagnosis not present

## 2022-10-27 DIAGNOSIS — W06XXXA Fall from bed, initial encounter: Secondary | ICD-10-CM | POA: Diagnosis not present

## 2022-10-27 DIAGNOSIS — G300 Alzheimer's disease with early onset: Secondary | ICD-10-CM | POA: Diagnosis not present

## 2022-10-27 DIAGNOSIS — S0990XA Unspecified injury of head, initial encounter: Secondary | ICD-10-CM | POA: Diagnosis not present

## 2022-10-27 DIAGNOSIS — F0390 Unspecified dementia without behavioral disturbance: Secondary | ICD-10-CM | POA: Diagnosis not present

## 2022-10-27 DIAGNOSIS — R531 Weakness: Secondary | ICD-10-CM | POA: Diagnosis not present

## 2022-10-27 DIAGNOSIS — R404 Transient alteration of awareness: Secondary | ICD-10-CM | POA: Diagnosis not present

## 2022-10-27 DIAGNOSIS — Z043 Encounter for examination and observation following other accident: Secondary | ICD-10-CM | POA: Insufficient documentation

## 2022-10-27 DIAGNOSIS — G9389 Other specified disorders of brain: Secondary | ICD-10-CM | POA: Diagnosis not present

## 2022-10-27 DIAGNOSIS — S199XXA Unspecified injury of neck, initial encounter: Secondary | ICD-10-CM | POA: Diagnosis not present

## 2022-10-27 DIAGNOSIS — R296 Repeated falls: Secondary | ICD-10-CM | POA: Diagnosis not present

## 2022-10-27 DIAGNOSIS — I10 Essential (primary) hypertension: Secondary | ICD-10-CM | POA: Diagnosis not present

## 2022-10-27 DIAGNOSIS — M160 Bilateral primary osteoarthritis of hip: Secondary | ICD-10-CM | POA: Diagnosis not present

## 2022-10-27 DIAGNOSIS — Z743 Need for continuous supervision: Secondary | ICD-10-CM | POA: Diagnosis not present

## 2022-10-27 DIAGNOSIS — W19XXXA Unspecified fall, initial encounter: Secondary | ICD-10-CM

## 2022-10-27 NOTE — ED Triage Notes (Signed)
Pt bib ems [facility] with unwitnessed fall out of bed. Unknown LOC. Not on anticoagulants. Per staff pt at mental baseline, oriented X0. No visible injury.  VSS with ems.

## 2022-10-27 NOTE — ED Provider Notes (Signed)
[place] EMERGENCY DEPARTMENT AT [facility] Provider Note   CSN: [id number] Arrival date & time: 10/27/22  1713     History HTN, Dementia  Chief Complaint  Patient presents with   Sherry Hicks is a 62 y.o. female.  62 year old female with a past medical history of HTN, dementia, presents to the ED via EMS status post fall.  According to staffing at [facility], patient is here s/p fall from her bed. Not ambulatory at baseline, alert but does not follow commands. LEVEL 5 CAVEAT. Full Code  The history is provided by the patient.  Fall Pertinent negatives include no headaches and no shortness of breath.       Home Medications Prior to Admission medications   Medication Sig Start Date End Date Taking? Authorizing Provider  acetaminophen (TYLENOL) 500 MG tablet Take 500 mg by mouth every 6 (six) hours as needed for moderate pain.    [provider]  carbamazepine (TEGRETOL) 200 MG tablet Take 2 tablets (400 mg total) by mouth 2 (two) times daily. 05/28/20   Russella Dar, NP  carbamazepine (TEGRETOL) 200 MG tablet Take 2 tablets (400 mg total) by mouth every evening. 05/28/20   Russella Dar, NP  citalopram (CELEXA) 40 MG tablet Take 1 tablet (40 mg total) by mouth daily. 05/28/20   Russella Dar, NP  Ensure Plus (ENSURE PLUS) LIQD Take 1 Can by mouth 2 (two) times daily between meals. Patient not taking: No sig reported 11/18/19   [name], [name], DO  gabapentin (NEURONTIN) 400 MG capsule Take 1 capsule (400 mg total) by mouth daily after lunch. 06/10/20   Zannie Cove, MD  LORazepam (ATIVAN) 0.5 MG tablet Take 1 tablet (0.5 mg total) by mouth 2 (two) times daily. 05/28/20   Russella Dar, NP  QUEtiapine (SEROQUEL) 100 MG tablet Take 1 tablet (100 mg total) by mouth 2 (two) times daily. 05/28/20   Russella Dar, NP  QUEtiapine (SEROQUEL) 50 MG tablet Take 3 tablets (150 mg total) by mouth at bedtime. 05/28/20   Russella Dar, NP       Allergies    Hydrocodone-acetaminophen and Donepezil    Review of Systems   Review of Systems  Unable to perform ROS: Mental status change  Constitutional:  Negative for fever.  Respiratory:  Negative for shortness of breath.   Neurological:  Negative for headaches.    Physical Exam Updated Vital Signs BP 121/74 (BP Location: Right Arm)   Pulse (!) 57   Temp 98.8 F (37.1 C)   Resp 15   SpO2 100%  Physical Exam Vitals and nursing note reviewed.  HENT:     Head: Normocephalic.     Comments: No signs of obvious trauma.     Mouth/Throat:     Mouth: Mucous membranes are dry.  Eyes:     Pupils: Pupils are equal, round, and reactive to light.     Comments: Pupils are equal.   Neck:     Comments: No ttp along the cervical spine.  Cardiovascular:     Rate and Rhythm: Normal rate.  Pulmonary:     Effort: Pulmonary effort is normal.     Breath sounds: No wheezing.  Abdominal:     General: Abdomen is flat.  Musculoskeletal:     Comments: Contracted at baseline.   Skin:    General: Skin is warm and dry.  Neurological:     Mental Status: She is alert.  Mental status is at baseline.     Comments: At baseline per facility.      ED Results / Procedures / Treatments   Labs (all labs ordered are listed, but only abnormal results are displayed) Labs Reviewed - No data to display  EKG None  Radiology CT Cervical Spine Wo Contrast  Result Date: 10/27/2022 CLINICAL DATA:  Neck trauma EXAM: CT CERVICAL SPINE WITHOUT CONTRAST TECHNIQUE: Multidetector CT imaging of the cervical spine was performed without intravenous contrast. Multiplanar CT image reconstructions were also generated. RADIATION DOSE REDUCTION: This exam was performed according to the departmental dose-optimization program which includes automated exposure control, adjustment of the mA and/or kV according to patient size and/or use of iterative reconstruction technique. COMPARISON:  03/12/2022 FINDINGS:  Alignment: Alignment is anatomic. Skull base and vertebrae: No acute fracture. No primary bone lesion or focal pathologic process. Soft tissues and spinal canal: No prevertebral fluid or swelling. No visible canal hematoma. Disc levels:  No significant spondylosis or facet hypertrophy. Upper chest: Airway is patent. Visualized portions of the lung apices are clear. Other: Reconstructed images demonstrate no additional findings. IMPRESSION: 1. No acute cervical spine fracture. Electronically Signed   By: Sharlet Salina M.D.   On: 10/27/2022 20:26   CT HEAD WO CONTRAST ( )  Result Date: 10/27/2022 CLINICAL DATA:  Head trauma, altered level of consciousness EXAM: CT HEAD WITHOUT CONTRAST TECHNIQUE: Contiguous axial images were obtained from the base of the skull through the vertex without intravenous contrast. RADIATION DOSE REDUCTION: This exam was performed according to the departmental dose-optimization program which includes automated exposure control, adjustment of the mA and/or kV according to patient size and/or use of iterative reconstruction technique. COMPARISON:  03/12/2022 FINDINGS: Brain: No acute infarct or hemorrhage. Chronic ventriculomegaly unchanged. Stable diffuse cerebral atrophy. Midline structures are unremarkable. No acute extra-axial fluid collections. No mass effect. Vascular: No hyperdense vessel or unexpected calcification. Skull: Normal. Negative for fracture or focal lesion. Sinuses/Orbits: No acute finding. Other: None. IMPRESSION: 1. No acute intracranial process. Electronically Signed   By: Sharlet Salina M.D.   On: 10/27/2022 20:24   DG Pelvis 1-2 Views  Result Date: 10/27/2022 CLINICAL DATA:  Larey Seat out of bed EXAM: PELVIS - 1-2 VIEW COMPARISON:  None Available. FINDINGS: Frontal view of the pelvis was performed, limited due to patient contractures. No evidence of acute displaced fracture. Bilateral hip osteoarthritis. Large amount of retained stool throughout the colon.  IMPRESSION: 1. No evidence of acute pelvic fracture on this exam limited by patient positioning. Electronically Signed   By: Sharlet Salina M.D.   On: 10/27/2022 19:31   DG Chest Port 1 View  Result Date: 10/27/2022 CLINICAL DATA:  Larey Seat out of bed EXAM: PORTABLE CHEST 1 VIEW COMPARISON:  02/22/2022 FINDINGS: Single frontal view of the chest demonstrates an unremarkable cardiac silhouette. No acute airspace disease, effusion, or pneumothorax. No acute displaced fracture. IMPRESSION: 1. No acute intrathoracic process. Electronically Signed   By: Sharlet Salina M.D.   On: 10/27/2022 19:30    Procedures Procedures    Medications Ordered in ED Medications - No data to display  ED Course/ Medical Decision Making/ A&P                                 Medical Decision Making Amount and/or Complexity of Data Reviewed Radiology: ordered.   Patient presents to the ED status post fall which occurred while she was at [facility]  health facility, she is contracted at baseline, does not follow commands or answer questions however is alert on my evaluation.  She is a full code at this time.  She is hemodynamically stable.  On exam there is no sign of bruising to her face, neck.  She does not move her upper or lower extremities and according to staff she is at baseline today.  Imaging was obtained such as CT head, cervical spine, pelvis, portable chest.   CT Head/Cervical spine showed no acute findings.  DG pelvis without any acute findings DG portable chest without any acute findings.    These results were discussed with her daughter [name] at [phone], she is aware of all the findings.  Patient will go home via [facility] back to [facility].  Hemodynamically stable for discharge.   Portions of this note were generated with Scientist, clinical (histocompatibility and immunogenetics). Dictation errors may occur despite best attempts at proofreading.  Final Clinical Impression(s) / ED Diagnoses Final diagnoses:  Fall, initial  encounter    Rx / DC Orders ED Discharge Orders     None         Claude Manges, Cordelia Poche 10/27/22 2049    Charlynne Pander, MD 10/28/22 [phone]

## 2022-10-27 NOTE — ED Notes (Signed)
Unable to take Pt. Temp. Attempted orally and axillary.

## 2022-10-27 NOTE — ED Notes (Signed)
Pt returned from CT scan.

## 2022-10-27 NOTE — ED Notes (Signed)
[facility] transport setup for pt

## 2022-10-27 NOTE — Discharge Instructions (Addendum)
The CT of your head, cervical spine were all within normal limits.

## 2022-10-28 DIAGNOSIS — Z7401 Bed confinement status: Secondary | ICD-10-CM | POA: Diagnosis not present

## 2022-10-28 DIAGNOSIS — R404 Transient alteration of awareness: Secondary | ICD-10-CM | POA: Diagnosis not present

## 2022-10-29 DIAGNOSIS — R5381 Other malaise: Secondary | ICD-10-CM | POA: Diagnosis not present

## 2022-10-29 DIAGNOSIS — M6281 Muscle weakness (generalized): Secondary | ICD-10-CM | POA: Diagnosis not present

## 2022-10-29 DIAGNOSIS — E440 Moderate protein-calorie malnutrition: Secondary | ICD-10-CM | POA: Diagnosis not present

## 2022-10-29 DIAGNOSIS — G309 Alzheimer's disease, unspecified: Secondary | ICD-10-CM | POA: Diagnosis not present

## 2022-10-29 DIAGNOSIS — Z9181 History of falling: Secondary | ICD-10-CM | POA: Diagnosis not present

## 2022-11-02 DIAGNOSIS — Z9181 History of falling: Secondary | ICD-10-CM | POA: Diagnosis not present

## 2022-11-02 DIAGNOSIS — E440 Moderate protein-calorie malnutrition: Secondary | ICD-10-CM | POA: Diagnosis not present

## 2022-11-02 DIAGNOSIS — M6281 Muscle weakness (generalized): Secondary | ICD-10-CM | POA: Diagnosis not present

## 2022-11-02 DIAGNOSIS — R5381 Other malaise: Secondary | ICD-10-CM | POA: Diagnosis not present

## 2022-11-02 DIAGNOSIS — G309 Alzheimer's disease, unspecified: Secondary | ICD-10-CM | POA: Diagnosis not present

## 2022-11-03 DIAGNOSIS — G309 Alzheimer's disease, unspecified: Secondary | ICD-10-CM | POA: Diagnosis not present

## 2022-11-03 DIAGNOSIS — G300 Alzheimer's disease with early onset: Secondary | ICD-10-CM | POA: Diagnosis not present

## 2022-11-03 DIAGNOSIS — E785 Hyperlipidemia, unspecified: Secondary | ICD-10-CM | POA: Diagnosis not present

## 2022-11-03 DIAGNOSIS — R5381 Other malaise: Secondary | ICD-10-CM | POA: Diagnosis not present

## 2022-11-03 DIAGNOSIS — I10 Essential (primary) hypertension: Secondary | ICD-10-CM | POA: Diagnosis not present

## 2022-11-03 DIAGNOSIS — M6281 Muscle weakness (generalized): Secondary | ICD-10-CM | POA: Diagnosis not present

## 2022-11-03 DIAGNOSIS — E440 Moderate protein-calorie malnutrition: Secondary | ICD-10-CM | POA: Diagnosis not present

## 2022-11-03 DIAGNOSIS — J45909 Unspecified asthma, uncomplicated: Secondary | ICD-10-CM | POA: Diagnosis not present

## 2022-11-03 DIAGNOSIS — G8929 Other chronic pain: Secondary | ICD-10-CM | POA: Diagnosis not present

## 2022-11-03 DIAGNOSIS — Z9181 History of falling: Secondary | ICD-10-CM | POA: Diagnosis not present

## 2022-11-05 DIAGNOSIS — R5381 Other malaise: Secondary | ICD-10-CM | POA: Diagnosis not present

## 2022-11-05 DIAGNOSIS — E440 Moderate protein-calorie malnutrition: Secondary | ICD-10-CM | POA: Diagnosis not present

## 2022-11-05 DIAGNOSIS — Z9181 History of falling: Secondary | ICD-10-CM | POA: Diagnosis not present

## 2022-11-05 DIAGNOSIS — G309 Alzheimer's disease, unspecified: Secondary | ICD-10-CM | POA: Diagnosis not present

## 2022-11-05 DIAGNOSIS — M6281 Muscle weakness (generalized): Secondary | ICD-10-CM | POA: Diagnosis not present

## 2022-11-06 DIAGNOSIS — Z9181 History of falling: Secondary | ICD-10-CM | POA: Diagnosis not present

## 2022-11-06 DIAGNOSIS — E440 Moderate protein-calorie malnutrition: Secondary | ICD-10-CM | POA: Diagnosis not present

## 2022-11-06 DIAGNOSIS — G309 Alzheimer's disease, unspecified: Secondary | ICD-10-CM | POA: Diagnosis not present

## 2022-11-06 DIAGNOSIS — M6281 Muscle weakness (generalized): Secondary | ICD-10-CM | POA: Diagnosis not present

## 2022-11-06 DIAGNOSIS — R5381 Other malaise: Secondary | ICD-10-CM | POA: Diagnosis not present

## 2022-11-07 DIAGNOSIS — Z9181 History of falling: Secondary | ICD-10-CM | POA: Diagnosis not present

## 2022-11-07 DIAGNOSIS — R5381 Other malaise: Secondary | ICD-10-CM | POA: Diagnosis not present

## 2022-11-07 DIAGNOSIS — G309 Alzheimer's disease, unspecified: Secondary | ICD-10-CM | POA: Diagnosis not present

## 2022-11-07 DIAGNOSIS — M6281 Muscle weakness (generalized): Secondary | ICD-10-CM | POA: Diagnosis not present

## 2022-11-07 DIAGNOSIS — E440 Moderate protein-calorie malnutrition: Secondary | ICD-10-CM | POA: Diagnosis not present

## 2022-11-10 DIAGNOSIS — G300 Alzheimer's disease with early onset: Secondary | ICD-10-CM | POA: Diagnosis not present

## 2022-11-10 DIAGNOSIS — E440 Moderate protein-calorie malnutrition: Secondary | ICD-10-CM | POA: Diagnosis not present

## 2022-11-10 DIAGNOSIS — R5381 Other malaise: Secondary | ICD-10-CM | POA: Diagnosis not present

## 2022-11-13 DIAGNOSIS — R5381 Other malaise: Secondary | ICD-10-CM | POA: Diagnosis not present

## 2022-11-13 DIAGNOSIS — G309 Alzheimer's disease, unspecified: Secondary | ICD-10-CM | POA: Diagnosis not present

## 2022-11-13 DIAGNOSIS — M6281 Muscle weakness (generalized): Secondary | ICD-10-CM | POA: Diagnosis not present

## 2022-11-13 DIAGNOSIS — Z9181 History of falling: Secondary | ICD-10-CM | POA: Diagnosis not present

## 2022-11-13 DIAGNOSIS — E440 Moderate protein-calorie malnutrition: Secondary | ICD-10-CM | POA: Diagnosis not present

## 2022-11-15 DIAGNOSIS — Z9181 History of falling: Secondary | ICD-10-CM | POA: Diagnosis not present

## 2022-11-15 DIAGNOSIS — R5381 Other malaise: Secondary | ICD-10-CM | POA: Diagnosis not present

## 2022-11-15 DIAGNOSIS — E440 Moderate protein-calorie malnutrition: Secondary | ICD-10-CM | POA: Diagnosis not present

## 2022-11-15 DIAGNOSIS — M6281 Muscle weakness (generalized): Secondary | ICD-10-CM | POA: Diagnosis not present

## 2022-11-15 DIAGNOSIS — G309 Alzheimer's disease, unspecified: Secondary | ICD-10-CM | POA: Diagnosis not present

## 2022-11-16 DIAGNOSIS — Z9181 History of falling: Secondary | ICD-10-CM | POA: Diagnosis not present

## 2022-11-16 DIAGNOSIS — M6281 Muscle weakness (generalized): Secondary | ICD-10-CM | POA: Diagnosis not present

## 2022-11-16 DIAGNOSIS — E440 Moderate protein-calorie malnutrition: Secondary | ICD-10-CM | POA: Diagnosis not present

## 2022-11-16 DIAGNOSIS — G309 Alzheimer's disease, unspecified: Secondary | ICD-10-CM | POA: Diagnosis not present

## 2022-11-16 DIAGNOSIS — R5381 Other malaise: Secondary | ICD-10-CM | POA: Diagnosis not present

## 2022-11-19 DIAGNOSIS — M6281 Muscle weakness (generalized): Secondary | ICD-10-CM | POA: Diagnosis not present

## 2022-11-19 DIAGNOSIS — E440 Moderate protein-calorie malnutrition: Secondary | ICD-10-CM | POA: Diagnosis not present

## 2022-11-19 DIAGNOSIS — G309 Alzheimer's disease, unspecified: Secondary | ICD-10-CM | POA: Diagnosis not present

## 2022-11-19 DIAGNOSIS — Z9181 History of falling: Secondary | ICD-10-CM | POA: Diagnosis not present

## 2022-11-19 DIAGNOSIS — R5381 Other malaise: Secondary | ICD-10-CM | POA: Diagnosis not present

## 2022-11-21 DIAGNOSIS — R5381 Other malaise: Secondary | ICD-10-CM | POA: Diagnosis not present

## 2022-11-21 DIAGNOSIS — G309 Alzheimer's disease, unspecified: Secondary | ICD-10-CM | POA: Diagnosis not present

## 2022-11-21 DIAGNOSIS — E440 Moderate protein-calorie malnutrition: Secondary | ICD-10-CM | POA: Diagnosis not present

## 2022-11-21 DIAGNOSIS — Z9181 History of falling: Secondary | ICD-10-CM | POA: Diagnosis not present

## 2022-11-21 DIAGNOSIS — M6281 Muscle weakness (generalized): Secondary | ICD-10-CM | POA: Diagnosis not present

## 2022-11-23 DIAGNOSIS — G309 Alzheimer's disease, unspecified: Secondary | ICD-10-CM | POA: Diagnosis not present

## 2022-11-23 DIAGNOSIS — R5381 Other malaise: Secondary | ICD-10-CM | POA: Diagnosis not present

## 2022-11-23 DIAGNOSIS — Z9181 History of falling: Secondary | ICD-10-CM | POA: Diagnosis not present

## 2022-11-23 DIAGNOSIS — E440 Moderate protein-calorie malnutrition: Secondary | ICD-10-CM | POA: Diagnosis not present

## 2022-11-23 DIAGNOSIS — M6281 Muscle weakness (generalized): Secondary | ICD-10-CM | POA: Diagnosis not present

## 2022-11-26 DIAGNOSIS — Z9181 History of falling: Secondary | ICD-10-CM | POA: Diagnosis not present

## 2022-11-26 DIAGNOSIS — M6281 Muscle weakness (generalized): Secondary | ICD-10-CM | POA: Diagnosis not present

## 2022-11-26 DIAGNOSIS — R5381 Other malaise: Secondary | ICD-10-CM | POA: Diagnosis not present

## 2022-11-26 DIAGNOSIS — G309 Alzheimer's disease, unspecified: Secondary | ICD-10-CM | POA: Diagnosis not present

## 2022-11-26 DIAGNOSIS — E440 Moderate protein-calorie malnutrition: Secondary | ICD-10-CM | POA: Diagnosis not present

## 2022-11-27 DIAGNOSIS — R5381 Other malaise: Secondary | ICD-10-CM | POA: Diagnosis not present

## 2022-11-27 DIAGNOSIS — G309 Alzheimer's disease, unspecified: Secondary | ICD-10-CM | POA: Diagnosis not present

## 2022-11-27 DIAGNOSIS — M6281 Muscle weakness (generalized): Secondary | ICD-10-CM | POA: Diagnosis not present

## 2022-11-27 DIAGNOSIS — E440 Moderate protein-calorie malnutrition: Secondary | ICD-10-CM | POA: Diagnosis not present

## 2022-11-27 DIAGNOSIS — Z9181 History of falling: Secondary | ICD-10-CM | POA: Diagnosis not present

## 2022-11-29 DIAGNOSIS — I10 Essential (primary) hypertension: Secondary | ICD-10-CM | POA: Diagnosis not present

## 2022-11-29 DIAGNOSIS — J45909 Unspecified asthma, uncomplicated: Secondary | ICD-10-CM | POA: Diagnosis not present

## 2022-11-29 DIAGNOSIS — E440 Moderate protein-calorie malnutrition: Secondary | ICD-10-CM | POA: Diagnosis not present

## 2022-11-30 ENCOUNTER — Telehealth: Payer: Self-pay

## 2022-11-30 DIAGNOSIS — G300 Alzheimer's disease with early onset: Secondary | ICD-10-CM | POA: Diagnosis not present

## 2022-11-30 DIAGNOSIS — E440 Moderate protein-calorie malnutrition: Secondary | ICD-10-CM | POA: Diagnosis not present

## 2022-11-30 DIAGNOSIS — Z9181 History of falling: Secondary | ICD-10-CM | POA: Diagnosis not present

## 2022-11-30 DIAGNOSIS — R5381 Other malaise: Secondary | ICD-10-CM | POA: Diagnosis not present

## 2022-11-30 DIAGNOSIS — G309 Alzheimer's disease, unspecified: Secondary | ICD-10-CM | POA: Diagnosis not present

## 2022-11-30 DIAGNOSIS — M6281 Muscle weakness (generalized): Secondary | ICD-10-CM | POA: Diagnosis not present

## 2022-11-30 NOTE — Patient Outreach (Signed)
[facility] Assistant attempted to call patient on today regarding preventative mammogram screening. No answer from patient after multiple rings. Assistant left confidential voicemail for patient to return call.  Will call back patient back for final attempt.  Baruch Gouty [facility] Assistant [facility] [phone]

## 2022-12-02 DIAGNOSIS — L89202 Pressure ulcer of unspecified hip, stage 2: Secondary | ICD-10-CM | POA: Diagnosis not present

## 2022-12-02 DIAGNOSIS — I10 Essential (primary) hypertension: Secondary | ICD-10-CM | POA: Diagnosis not present

## 2022-12-02 DIAGNOSIS — G8929 Other chronic pain: Secondary | ICD-10-CM | POA: Diagnosis not present

## 2022-12-02 DIAGNOSIS — E785 Hyperlipidemia, unspecified: Secondary | ICD-10-CM | POA: Diagnosis not present

## 2022-12-02 DIAGNOSIS — G300 Alzheimer's disease with early onset: Secondary | ICD-10-CM | POA: Diagnosis not present

## 2022-12-02 DIAGNOSIS — J45909 Unspecified asthma, uncomplicated: Secondary | ICD-10-CM | POA: Diagnosis not present

## 2022-12-03 DIAGNOSIS — Z9181 History of falling: Secondary | ICD-10-CM | POA: Diagnosis not present

## 2022-12-03 DIAGNOSIS — E440 Moderate protein-calorie malnutrition: Secondary | ICD-10-CM | POA: Diagnosis not present

## 2022-12-03 DIAGNOSIS — R5381 Other malaise: Secondary | ICD-10-CM | POA: Diagnosis not present

## 2022-12-03 DIAGNOSIS — G309 Alzheimer's disease, unspecified: Secondary | ICD-10-CM | POA: Diagnosis not present

## 2022-12-03 DIAGNOSIS — M6281 Muscle weakness (generalized): Secondary | ICD-10-CM | POA: Diagnosis not present

## 2022-12-04 DIAGNOSIS — E440 Moderate protein-calorie malnutrition: Secondary | ICD-10-CM | POA: Diagnosis not present

## 2022-12-04 DIAGNOSIS — M6281 Muscle weakness (generalized): Secondary | ICD-10-CM | POA: Diagnosis not present

## 2022-12-04 DIAGNOSIS — G309 Alzheimer's disease, unspecified: Secondary | ICD-10-CM | POA: Diagnosis not present

## 2022-12-04 DIAGNOSIS — R5381 Other malaise: Secondary | ICD-10-CM | POA: Diagnosis not present

## 2022-12-04 DIAGNOSIS — Z9181 History of falling: Secondary | ICD-10-CM | POA: Diagnosis not present

## 2022-12-05 DIAGNOSIS — Z9181 History of falling: Secondary | ICD-10-CM | POA: Diagnosis not present

## 2022-12-05 DIAGNOSIS — E440 Moderate protein-calorie malnutrition: Secondary | ICD-10-CM | POA: Diagnosis not present

## 2022-12-05 DIAGNOSIS — R5381 Other malaise: Secondary | ICD-10-CM | POA: Diagnosis not present

## 2022-12-05 DIAGNOSIS — G309 Alzheimer's disease, unspecified: Secondary | ICD-10-CM | POA: Diagnosis not present

## 2022-12-05 DIAGNOSIS — M6281 Muscle weakness (generalized): Secondary | ICD-10-CM | POA: Diagnosis not present

## 2022-12-10 DIAGNOSIS — Z9181 History of falling: Secondary | ICD-10-CM | POA: Diagnosis not present

## 2022-12-10 DIAGNOSIS — M6281 Muscle weakness (generalized): Secondary | ICD-10-CM | POA: Diagnosis not present

## 2022-12-10 DIAGNOSIS — R5381 Other malaise: Secondary | ICD-10-CM | POA: Diagnosis not present

## 2022-12-10 DIAGNOSIS — G309 Alzheimer's disease, unspecified: Secondary | ICD-10-CM | POA: Diagnosis not present

## 2022-12-10 DIAGNOSIS — E440 Moderate protein-calorie malnutrition: Secondary | ICD-10-CM | POA: Diagnosis not present

## 2022-12-11 DIAGNOSIS — Z9181 History of falling: Secondary | ICD-10-CM | POA: Diagnosis not present

## 2022-12-11 DIAGNOSIS — R5381 Other malaise: Secondary | ICD-10-CM | POA: Diagnosis not present

## 2022-12-11 DIAGNOSIS — E440 Moderate protein-calorie malnutrition: Secondary | ICD-10-CM | POA: Diagnosis not present

## 2022-12-11 DIAGNOSIS — G309 Alzheimer's disease, unspecified: Secondary | ICD-10-CM | POA: Diagnosis not present

## 2022-12-11 DIAGNOSIS — M6281 Muscle weakness (generalized): Secondary | ICD-10-CM | POA: Diagnosis not present

## 2022-12-12 DIAGNOSIS — M6281 Muscle weakness (generalized): Secondary | ICD-10-CM | POA: Diagnosis not present

## 2022-12-12 DIAGNOSIS — R5381 Other malaise: Secondary | ICD-10-CM | POA: Diagnosis not present

## 2022-12-12 DIAGNOSIS — Z9181 History of falling: Secondary | ICD-10-CM | POA: Diagnosis not present

## 2022-12-12 DIAGNOSIS — G309 Alzheimer's disease, unspecified: Secondary | ICD-10-CM | POA: Diagnosis not present

## 2022-12-12 DIAGNOSIS — E440 Moderate protein-calorie malnutrition: Secondary | ICD-10-CM | POA: Diagnosis not present

## 2022-12-18 DIAGNOSIS — R5381 Other malaise: Secondary | ICD-10-CM | POA: Diagnosis not present

## 2022-12-18 DIAGNOSIS — Z9181 History of falling: Secondary | ICD-10-CM | POA: Diagnosis not present

## 2022-12-18 DIAGNOSIS — E440 Moderate protein-calorie malnutrition: Secondary | ICD-10-CM | POA: Diagnosis not present

## 2022-12-18 DIAGNOSIS — M6281 Muscle weakness (generalized): Secondary | ICD-10-CM | POA: Diagnosis not present

## 2022-12-18 DIAGNOSIS — G309 Alzheimer's disease, unspecified: Secondary | ICD-10-CM | POA: Diagnosis not present

## 2022-12-19 DIAGNOSIS — R5381 Other malaise: Secondary | ICD-10-CM | POA: Diagnosis not present

## 2022-12-19 DIAGNOSIS — Z9181 History of falling: Secondary | ICD-10-CM | POA: Diagnosis not present

## 2022-12-19 DIAGNOSIS — M6281 Muscle weakness (generalized): Secondary | ICD-10-CM | POA: Diagnosis not present

## 2022-12-19 DIAGNOSIS — G309 Alzheimer's disease, unspecified: Secondary | ICD-10-CM | POA: Diagnosis not present

## 2022-12-19 DIAGNOSIS — E440 Moderate protein-calorie malnutrition: Secondary | ICD-10-CM | POA: Diagnosis not present

## 2022-12-20 DIAGNOSIS — R5381 Other malaise: Secondary | ICD-10-CM | POA: Diagnosis not present

## 2022-12-20 DIAGNOSIS — Z9181 History of falling: Secondary | ICD-10-CM | POA: Diagnosis not present

## 2022-12-20 DIAGNOSIS — M6281 Muscle weakness (generalized): Secondary | ICD-10-CM | POA: Diagnosis not present

## 2022-12-20 DIAGNOSIS — E440 Moderate protein-calorie malnutrition: Secondary | ICD-10-CM | POA: Diagnosis not present

## 2022-12-20 DIAGNOSIS — G309 Alzheimer's disease, unspecified: Secondary | ICD-10-CM | POA: Diagnosis not present

## 2022-12-21 DIAGNOSIS — I10 Essential (primary) hypertension: Secondary | ICD-10-CM | POA: Diagnosis not present

## 2022-12-21 DIAGNOSIS — E440 Moderate protein-calorie malnutrition: Secondary | ICD-10-CM | POA: Diagnosis not present

## 2022-12-21 DIAGNOSIS — G309 Alzheimer's disease, unspecified: Secondary | ICD-10-CM | POA: Diagnosis not present

## 2022-12-21 DIAGNOSIS — J45909 Unspecified asthma, uncomplicated: Secondary | ICD-10-CM | POA: Diagnosis not present

## 2022-12-21 DIAGNOSIS — G63 Polyneuropathy in diseases classified elsewhere: Secondary | ICD-10-CM | POA: Diagnosis not present

## 2022-12-24 DIAGNOSIS — Z9181 History of falling: Secondary | ICD-10-CM | POA: Diagnosis not present

## 2022-12-24 DIAGNOSIS — G309 Alzheimer's disease, unspecified: Secondary | ICD-10-CM | POA: Diagnosis not present

## 2022-12-24 DIAGNOSIS — R5381 Other malaise: Secondary | ICD-10-CM | POA: Diagnosis not present

## 2022-12-24 DIAGNOSIS — M6281 Muscle weakness (generalized): Secondary | ICD-10-CM | POA: Diagnosis not present

## 2022-12-24 DIAGNOSIS — E440 Moderate protein-calorie malnutrition: Secondary | ICD-10-CM | POA: Diagnosis not present

## 2022-12-28 DIAGNOSIS — Z9181 History of falling: Secondary | ICD-10-CM | POA: Diagnosis not present

## 2022-12-28 DIAGNOSIS — E440 Moderate protein-calorie malnutrition: Secondary | ICD-10-CM | POA: Diagnosis not present

## 2022-12-28 DIAGNOSIS — M6281 Muscle weakness (generalized): Secondary | ICD-10-CM | POA: Diagnosis not present

## 2022-12-28 DIAGNOSIS — G309 Alzheimer's disease, unspecified: Secondary | ICD-10-CM | POA: Diagnosis not present

## 2022-12-28 DIAGNOSIS — R5381 Other malaise: Secondary | ICD-10-CM | POA: Diagnosis not present

## 2022-12-30 DIAGNOSIS — Z9181 History of falling: Secondary | ICD-10-CM | POA: Diagnosis not present

## 2022-12-30 DIAGNOSIS — E440 Moderate protein-calorie malnutrition: Secondary | ICD-10-CM | POA: Diagnosis not present

## 2022-12-30 DIAGNOSIS — G309 Alzheimer's disease, unspecified: Secondary | ICD-10-CM | POA: Diagnosis not present

## 2022-12-30 DIAGNOSIS — R5381 Other malaise: Secondary | ICD-10-CM | POA: Diagnosis not present

## 2022-12-30 DIAGNOSIS — M6281 Muscle weakness (generalized): Secondary | ICD-10-CM | POA: Diagnosis not present

## 2022-12-31 DIAGNOSIS — Z9181 History of falling: Secondary | ICD-10-CM | POA: Diagnosis not present

## 2022-12-31 DIAGNOSIS — R5381 Other malaise: Secondary | ICD-10-CM | POA: Diagnosis not present

## 2022-12-31 DIAGNOSIS — M6281 Muscle weakness (generalized): Secondary | ICD-10-CM | POA: Diagnosis not present

## 2022-12-31 DIAGNOSIS — E440 Moderate protein-calorie malnutrition: Secondary | ICD-10-CM | POA: Diagnosis not present

## 2022-12-31 DIAGNOSIS — G309 Alzheimer's disease, unspecified: Secondary | ICD-10-CM | POA: Diagnosis not present

## 2023-01-01 DIAGNOSIS — G309 Alzheimer's disease, unspecified: Secondary | ICD-10-CM | POA: Diagnosis not present

## 2023-01-01 DIAGNOSIS — M6281 Muscle weakness (generalized): Secondary | ICD-10-CM | POA: Diagnosis not present

## 2023-01-01 DIAGNOSIS — E440 Moderate protein-calorie malnutrition: Secondary | ICD-10-CM | POA: Diagnosis not present

## 2023-01-01 DIAGNOSIS — R5381 Other malaise: Secondary | ICD-10-CM | POA: Diagnosis not present

## 2023-01-01 DIAGNOSIS — Z9181 History of falling: Secondary | ICD-10-CM | POA: Diagnosis not present

## 2023-01-02 DIAGNOSIS — Z9181 History of falling: Secondary | ICD-10-CM | POA: Diagnosis not present

## 2023-01-02 DIAGNOSIS — G309 Alzheimer's disease, unspecified: Secondary | ICD-10-CM | POA: Diagnosis not present

## 2023-01-02 DIAGNOSIS — R5381 Other malaise: Secondary | ICD-10-CM | POA: Diagnosis not present

## 2023-01-02 DIAGNOSIS — M6281 Muscle weakness (generalized): Secondary | ICD-10-CM | POA: Diagnosis not present

## 2023-01-02 DIAGNOSIS — E440 Moderate protein-calorie malnutrition: Secondary | ICD-10-CM | POA: Diagnosis not present

## 2023-01-04 DIAGNOSIS — E440 Moderate protein-calorie malnutrition: Secondary | ICD-10-CM | POA: Diagnosis not present

## 2023-01-04 DIAGNOSIS — R5381 Other malaise: Secondary | ICD-10-CM | POA: Diagnosis not present

## 2023-01-04 DIAGNOSIS — G300 Alzheimer's disease with early onset: Secondary | ICD-10-CM | POA: Diagnosis not present

## 2023-01-07 DIAGNOSIS — M6281 Muscle weakness (generalized): Secondary | ICD-10-CM | POA: Diagnosis not present

## 2023-01-07 DIAGNOSIS — E440 Moderate protein-calorie malnutrition: Secondary | ICD-10-CM | POA: Diagnosis not present

## 2023-01-07 DIAGNOSIS — Z9181 History of falling: Secondary | ICD-10-CM | POA: Diagnosis not present

## 2023-01-07 DIAGNOSIS — G309 Alzheimer's disease, unspecified: Secondary | ICD-10-CM | POA: Diagnosis not present

## 2023-01-07 DIAGNOSIS — R5381 Other malaise: Secondary | ICD-10-CM | POA: Diagnosis not present

## 2023-01-08 DIAGNOSIS — E440 Moderate protein-calorie malnutrition: Secondary | ICD-10-CM | POA: Diagnosis not present

## 2023-01-08 DIAGNOSIS — M6281 Muscle weakness (generalized): Secondary | ICD-10-CM | POA: Diagnosis not present

## 2023-01-08 DIAGNOSIS — R5381 Other malaise: Secondary | ICD-10-CM | POA: Diagnosis not present

## 2023-01-08 DIAGNOSIS — G309 Alzheimer's disease, unspecified: Secondary | ICD-10-CM | POA: Diagnosis not present

## 2023-01-08 DIAGNOSIS — Z9181 History of falling: Secondary | ICD-10-CM | POA: Diagnosis not present

## 2023-01-09 DIAGNOSIS — Z9181 History of falling: Secondary | ICD-10-CM | POA: Diagnosis not present

## 2023-01-09 DIAGNOSIS — E440 Moderate protein-calorie malnutrition: Secondary | ICD-10-CM | POA: Diagnosis not present

## 2023-01-09 DIAGNOSIS — G300 Alzheimer's disease with early onset: Secondary | ICD-10-CM | POA: Diagnosis not present

## 2023-01-09 DIAGNOSIS — G8929 Other chronic pain: Secondary | ICD-10-CM | POA: Diagnosis not present

## 2023-01-09 DIAGNOSIS — M6281 Muscle weakness (generalized): Secondary | ICD-10-CM | POA: Diagnosis not present

## 2023-01-09 DIAGNOSIS — R5381 Other malaise: Secondary | ICD-10-CM | POA: Diagnosis not present

## 2023-01-09 DIAGNOSIS — G309 Alzheimer's disease, unspecified: Secondary | ICD-10-CM | POA: Diagnosis not present

## 2023-01-09 DIAGNOSIS — E785 Hyperlipidemia, unspecified: Secondary | ICD-10-CM | POA: Diagnosis not present

## 2023-01-09 DIAGNOSIS — J45909 Unspecified asthma, uncomplicated: Secondary | ICD-10-CM | POA: Diagnosis not present

## 2023-01-09 DIAGNOSIS — I10 Essential (primary) hypertension: Secondary | ICD-10-CM | POA: Diagnosis not present

## 2023-01-18 DIAGNOSIS — I10 Essential (primary) hypertension: Secondary | ICD-10-CM | POA: Diagnosis not present

## 2023-01-18 DIAGNOSIS — Z9181 History of falling: Secondary | ICD-10-CM | POA: Diagnosis not present

## 2023-01-18 DIAGNOSIS — E440 Moderate protein-calorie malnutrition: Secondary | ICD-10-CM | POA: Diagnosis not present

## 2023-01-18 DIAGNOSIS — M6281 Muscle weakness (generalized): Secondary | ICD-10-CM | POA: Diagnosis not present

## 2023-01-18 DIAGNOSIS — R5381 Other malaise: Secondary | ICD-10-CM | POA: Diagnosis not present

## 2023-01-18 DIAGNOSIS — J45909 Unspecified asthma, uncomplicated: Secondary | ICD-10-CM | POA: Diagnosis not present

## 2023-01-18 DIAGNOSIS — G63 Polyneuropathy in diseases classified elsewhere: Secondary | ICD-10-CM | POA: Diagnosis not present

## 2023-01-18 DIAGNOSIS — G309 Alzheimer's disease, unspecified: Secondary | ICD-10-CM | POA: Diagnosis not present

## 2023-01-19 DIAGNOSIS — G309 Alzheimer's disease, unspecified: Secondary | ICD-10-CM | POA: Diagnosis not present

## 2023-01-19 DIAGNOSIS — M6281 Muscle weakness (generalized): Secondary | ICD-10-CM | POA: Diagnosis not present

## 2023-01-19 DIAGNOSIS — R5381 Other malaise: Secondary | ICD-10-CM | POA: Diagnosis not present

## 2023-01-19 DIAGNOSIS — E440 Moderate protein-calorie malnutrition: Secondary | ICD-10-CM | POA: Diagnosis not present

## 2023-01-19 DIAGNOSIS — Z9181 History of falling: Secondary | ICD-10-CM | POA: Diagnosis not present

## 2023-01-20 DIAGNOSIS — G309 Alzheimer's disease, unspecified: Secondary | ICD-10-CM | POA: Diagnosis not present

## 2023-01-20 DIAGNOSIS — M6281 Muscle weakness (generalized): Secondary | ICD-10-CM | POA: Diagnosis not present

## 2023-01-20 DIAGNOSIS — R5381 Other malaise: Secondary | ICD-10-CM | POA: Diagnosis not present

## 2023-01-20 DIAGNOSIS — Z9181 History of falling: Secondary | ICD-10-CM | POA: Diagnosis not present

## 2023-01-20 DIAGNOSIS — E440 Moderate protein-calorie malnutrition: Secondary | ICD-10-CM | POA: Diagnosis not present

## 2023-01-21 DIAGNOSIS — E440 Moderate protein-calorie malnutrition: Secondary | ICD-10-CM | POA: Diagnosis not present

## 2023-01-21 DIAGNOSIS — G309 Alzheimer's disease, unspecified: Secondary | ICD-10-CM | POA: Diagnosis not present

## 2023-01-21 DIAGNOSIS — M6281 Muscle weakness (generalized): Secondary | ICD-10-CM | POA: Diagnosis not present

## 2023-01-21 DIAGNOSIS — Z9181 History of falling: Secondary | ICD-10-CM | POA: Diagnosis not present

## 2023-01-21 DIAGNOSIS — R5381 Other malaise: Secondary | ICD-10-CM | POA: Diagnosis not present

## 2023-01-22 DIAGNOSIS — E440 Moderate protein-calorie malnutrition: Secondary | ICD-10-CM | POA: Diagnosis not present

## 2023-01-22 DIAGNOSIS — R5381 Other malaise: Secondary | ICD-10-CM | POA: Diagnosis not present

## 2023-01-22 DIAGNOSIS — M6281 Muscle weakness (generalized): Secondary | ICD-10-CM | POA: Diagnosis not present

## 2023-01-22 DIAGNOSIS — G309 Alzheimer's disease, unspecified: Secondary | ICD-10-CM | POA: Diagnosis not present

## 2023-01-22 DIAGNOSIS — Z9181 History of falling: Secondary | ICD-10-CM | POA: Diagnosis not present

## 2023-01-23 DIAGNOSIS — Z9181 History of falling: Secondary | ICD-10-CM | POA: Diagnosis not present

## 2023-01-23 DIAGNOSIS — R5381 Other malaise: Secondary | ICD-10-CM | POA: Diagnosis not present

## 2023-01-23 DIAGNOSIS — M6281 Muscle weakness (generalized): Secondary | ICD-10-CM | POA: Diagnosis not present

## 2023-01-23 DIAGNOSIS — G309 Alzheimer's disease, unspecified: Secondary | ICD-10-CM | POA: Diagnosis not present

## 2023-01-23 DIAGNOSIS — E440 Moderate protein-calorie malnutrition: Secondary | ICD-10-CM | POA: Diagnosis not present

## 2023-02-01 DIAGNOSIS — G300 Alzheimer's disease with early onset: Secondary | ICD-10-CM | POA: Diagnosis not present

## 2023-02-01 DIAGNOSIS — Z0000 Encounter for general adult medical examination without abnormal findings: Secondary | ICD-10-CM | POA: Diagnosis not present

## 2023-02-01 DIAGNOSIS — E440 Moderate protein-calorie malnutrition: Secondary | ICD-10-CM | POA: Diagnosis not present

## 2023-02-01 DIAGNOSIS — R5381 Other malaise: Secondary | ICD-10-CM | POA: Diagnosis not present

## 2023-02-07 DIAGNOSIS — L89202 Pressure ulcer of unspecified hip, stage 2: Secondary | ICD-10-CM | POA: Diagnosis not present

## 2023-02-07 DIAGNOSIS — I10 Essential (primary) hypertension: Secondary | ICD-10-CM | POA: Diagnosis not present

## 2023-02-07 DIAGNOSIS — G8929 Other chronic pain: Secondary | ICD-10-CM | POA: Diagnosis not present

## 2023-02-07 DIAGNOSIS — J45909 Unspecified asthma, uncomplicated: Secondary | ICD-10-CM | POA: Diagnosis not present

## 2023-02-07 DIAGNOSIS — G300 Alzheimer's disease with early onset: Secondary | ICD-10-CM | POA: Diagnosis not present

## 2023-02-08 DIAGNOSIS — J45909 Unspecified asthma, uncomplicated: Secondary | ICD-10-CM | POA: Diagnosis not present

## 2023-02-08 DIAGNOSIS — E440 Moderate protein-calorie malnutrition: Secondary | ICD-10-CM | POA: Diagnosis not present

## 2023-02-08 DIAGNOSIS — I10 Essential (primary) hypertension: Secondary | ICD-10-CM | POA: Diagnosis not present

## 2023-02-08 DIAGNOSIS — M150 Primary generalized (osteo)arthritis: Secondary | ICD-10-CM | POA: Diagnosis not present

## 2023-02-15 DIAGNOSIS — G63 Polyneuropathy in diseases classified elsewhere: Secondary | ICD-10-CM | POA: Diagnosis not present

## 2023-02-15 DIAGNOSIS — I10 Essential (primary) hypertension: Secondary | ICD-10-CM | POA: Diagnosis not present

## 2023-02-15 DIAGNOSIS — M150 Primary generalized (osteo)arthritis: Secondary | ICD-10-CM | POA: Diagnosis not present

## 2023-02-15 DIAGNOSIS — J45909 Unspecified asthma, uncomplicated: Secondary | ICD-10-CM | POA: Diagnosis not present

## 2023-03-08 DIAGNOSIS — E440 Moderate protein-calorie malnutrition: Secondary | ICD-10-CM | POA: Diagnosis not present

## 2023-03-08 DIAGNOSIS — R5381 Other malaise: Secondary | ICD-10-CM | POA: Diagnosis not present

## 2023-03-08 DIAGNOSIS — G300 Alzheimer's disease with early onset: Secondary | ICD-10-CM | POA: Diagnosis not present

## 2023-03-14 DIAGNOSIS — M6281 Muscle weakness (generalized): Secondary | ICD-10-CM | POA: Diagnosis not present

## 2023-03-15 DIAGNOSIS — I10 Essential (primary) hypertension: Secondary | ICD-10-CM | POA: Diagnosis not present

## 2023-03-15 DIAGNOSIS — G300 Alzheimer's disease with early onset: Secondary | ICD-10-CM | POA: Diagnosis not present

## 2023-03-15 DIAGNOSIS — G8929 Other chronic pain: Secondary | ICD-10-CM | POA: Diagnosis not present

## 2023-03-15 DIAGNOSIS — J45909 Unspecified asthma, uncomplicated: Secondary | ICD-10-CM | POA: Diagnosis not present

## 2023-03-23 DIAGNOSIS — E440 Moderate protein-calorie malnutrition: Secondary | ICD-10-CM | POA: Diagnosis not present

## 2023-03-23 DIAGNOSIS — J45909 Unspecified asthma, uncomplicated: Secondary | ICD-10-CM | POA: Diagnosis not present

## 2023-03-23 DIAGNOSIS — I10 Essential (primary) hypertension: Secondary | ICD-10-CM | POA: Diagnosis not present

## 2023-03-23 DIAGNOSIS — M150 Primary generalized (osteo)arthritis: Secondary | ICD-10-CM | POA: Diagnosis not present

## 2023-04-02 DIAGNOSIS — E785 Hyperlipidemia, unspecified: Secondary | ICD-10-CM | POA: Diagnosis not present

## 2023-04-02 DIAGNOSIS — E559 Vitamin D deficiency, unspecified: Secondary | ICD-10-CM | POA: Diagnosis not present

## 2023-04-02 DIAGNOSIS — E119 Type 2 diabetes mellitus without complications: Secondary | ICD-10-CM | POA: Diagnosis not present

## 2023-04-02 DIAGNOSIS — J4520 Mild intermittent asthma, uncomplicated: Secondary | ICD-10-CM | POA: Diagnosis not present

## 2023-04-03 DIAGNOSIS — E559 Vitamin D deficiency, unspecified: Secondary | ICD-10-CM | POA: Diagnosis not present

## 2023-04-03 DIAGNOSIS — E440 Moderate protein-calorie malnutrition: Secondary | ICD-10-CM | POA: Diagnosis not present

## 2023-04-03 DIAGNOSIS — M150 Primary generalized (osteo)arthritis: Secondary | ICD-10-CM | POA: Diagnosis not present

## 2023-04-03 DIAGNOSIS — E785 Hyperlipidemia, unspecified: Secondary | ICD-10-CM | POA: Diagnosis not present

## 2023-04-03 DIAGNOSIS — J45909 Unspecified asthma, uncomplicated: Secondary | ICD-10-CM | POA: Diagnosis not present

## 2023-04-03 DIAGNOSIS — I10 Essential (primary) hypertension: Secondary | ICD-10-CM | POA: Diagnosis not present

## 2023-04-03 DIAGNOSIS — G63 Polyneuropathy in diseases classified elsewhere: Secondary | ICD-10-CM | POA: Diagnosis not present

## 2023-04-05 DIAGNOSIS — G300 Alzheimer's disease with early onset: Secondary | ICD-10-CM | POA: Diagnosis not present

## 2023-04-05 DIAGNOSIS — R5381 Other malaise: Secondary | ICD-10-CM | POA: Diagnosis not present

## 2023-04-05 DIAGNOSIS — E440 Moderate protein-calorie malnutrition: Secondary | ICD-10-CM | POA: Diagnosis not present

## 2023-04-06 DIAGNOSIS — M2450 Contracture, unspecified joint: Secondary | ICD-10-CM | POA: Diagnosis not present

## 2023-04-09 DIAGNOSIS — M2450 Contracture, unspecified joint: Secondary | ICD-10-CM | POA: Diagnosis not present

## 2023-04-10 DIAGNOSIS — J45909 Unspecified asthma, uncomplicated: Secondary | ICD-10-CM | POA: Diagnosis not present

## 2023-04-10 DIAGNOSIS — I10 Essential (primary) hypertension: Secondary | ICD-10-CM | POA: Diagnosis not present

## 2023-04-10 DIAGNOSIS — G309 Alzheimer's disease, unspecified: Secondary | ICD-10-CM | POA: Diagnosis not present

## 2023-04-10 DIAGNOSIS — G63 Polyneuropathy in diseases classified elsewhere: Secondary | ICD-10-CM | POA: Diagnosis not present

## 2023-04-10 DIAGNOSIS — M2450 Contracture, unspecified joint: Secondary | ICD-10-CM | POA: Diagnosis not present

## 2023-04-10 DIAGNOSIS — E440 Moderate protein-calorie malnutrition: Secondary | ICD-10-CM | POA: Diagnosis not present

## 2023-04-11 DIAGNOSIS — G8929 Other chronic pain: Secondary | ICD-10-CM | POA: Diagnosis not present

## 2023-04-11 DIAGNOSIS — E785 Hyperlipidemia, unspecified: Secondary | ICD-10-CM | POA: Diagnosis not present

## 2023-04-11 DIAGNOSIS — I10 Essential (primary) hypertension: Secondary | ICD-10-CM | POA: Diagnosis not present

## 2023-04-11 DIAGNOSIS — L89202 Pressure ulcer of unspecified hip, stage 2: Secondary | ICD-10-CM | POA: Diagnosis not present

## 2023-04-11 DIAGNOSIS — G300 Alzheimer's disease with early onset: Secondary | ICD-10-CM | POA: Diagnosis not present

## 2023-04-11 DIAGNOSIS — J45909 Unspecified asthma, uncomplicated: Secondary | ICD-10-CM | POA: Diagnosis not present

## 2023-04-13 DIAGNOSIS — M2450 Contracture, unspecified joint: Secondary | ICD-10-CM | POA: Diagnosis not present

## 2023-04-14 DIAGNOSIS — M2450 Contracture, unspecified joint: Secondary | ICD-10-CM | POA: Diagnosis not present

## 2023-04-18 DIAGNOSIS — E440 Moderate protein-calorie malnutrition: Secondary | ICD-10-CM | POA: Diagnosis not present

## 2023-04-18 DIAGNOSIS — G63 Polyneuropathy in diseases classified elsewhere: Secondary | ICD-10-CM | POA: Diagnosis not present

## 2023-04-18 DIAGNOSIS — M150 Primary generalized (osteo)arthritis: Secondary | ICD-10-CM | POA: Diagnosis not present

## 2023-04-18 DIAGNOSIS — I10 Essential (primary) hypertension: Secondary | ICD-10-CM | POA: Diagnosis not present

## 2023-04-18 DIAGNOSIS — M2450 Contracture, unspecified joint: Secondary | ICD-10-CM | POA: Diagnosis not present

## 2023-04-18 DIAGNOSIS — J45909 Unspecified asthma, uncomplicated: Secondary | ICD-10-CM | POA: Diagnosis not present

## 2023-04-19 DIAGNOSIS — M2450 Contracture, unspecified joint: Secondary | ICD-10-CM | POA: Diagnosis not present

## 2023-04-20 DIAGNOSIS — M2450 Contracture, unspecified joint: Secondary | ICD-10-CM | POA: Diagnosis not present

## 2023-04-21 DIAGNOSIS — M2450 Contracture, unspecified joint: Secondary | ICD-10-CM | POA: Diagnosis not present

## 2023-04-22 DIAGNOSIS — M2450 Contracture, unspecified joint: Secondary | ICD-10-CM | POA: Diagnosis not present

## 2023-04-24 DIAGNOSIS — J45909 Unspecified asthma, uncomplicated: Secondary | ICD-10-CM | POA: Diagnosis not present

## 2023-04-24 DIAGNOSIS — I10 Essential (primary) hypertension: Secondary | ICD-10-CM | POA: Diagnosis not present

## 2023-04-24 DIAGNOSIS — E440 Moderate protein-calorie malnutrition: Secondary | ICD-10-CM | POA: Diagnosis not present

## 2023-04-24 DIAGNOSIS — M150 Primary generalized (osteo)arthritis: Secondary | ICD-10-CM | POA: Diagnosis not present

## 2023-04-26 DIAGNOSIS — R239 Unspecified skin changes: Secondary | ICD-10-CM | POA: Diagnosis not present

## 2023-04-26 DIAGNOSIS — R627 Adult failure to thrive: Secondary | ICD-10-CM | POA: Diagnosis not present

## 2023-04-26 DIAGNOSIS — M6281 Muscle weakness (generalized): Secondary | ICD-10-CM | POA: Diagnosis not present

## 2023-04-26 DIAGNOSIS — L89313 Pressure ulcer of right buttock, stage 3: Secondary | ICD-10-CM | POA: Diagnosis not present

## 2023-04-26 DIAGNOSIS — E441 Mild protein-calorie malnutrition: Secondary | ICD-10-CM | POA: Diagnosis not present

## 2023-04-27 DIAGNOSIS — L89313 Pressure ulcer of right buttock, stage 3: Secondary | ICD-10-CM | POA: Diagnosis not present

## 2023-04-27 DIAGNOSIS — M2450 Contracture, unspecified joint: Secondary | ICD-10-CM | POA: Diagnosis not present

## 2023-04-28 DIAGNOSIS — M2450 Contracture, unspecified joint: Secondary | ICD-10-CM | POA: Diagnosis not present

## 2023-04-30 DIAGNOSIS — M2450 Contracture, unspecified joint: Secondary | ICD-10-CM | POA: Diagnosis not present

## 2023-05-01 DIAGNOSIS — J45909 Unspecified asthma, uncomplicated: Secondary | ICD-10-CM | POA: Diagnosis not present

## 2023-05-01 DIAGNOSIS — G309 Alzheimer's disease, unspecified: Secondary | ICD-10-CM | POA: Diagnosis not present

## 2023-05-01 DIAGNOSIS — E440 Moderate protein-calorie malnutrition: Secondary | ICD-10-CM | POA: Diagnosis not present

## 2023-05-01 DIAGNOSIS — M150 Primary generalized (osteo)arthritis: Secondary | ICD-10-CM | POA: Diagnosis not present

## 2023-05-01 DIAGNOSIS — I10 Essential (primary) hypertension: Secondary | ICD-10-CM | POA: Diagnosis not present

## 2023-05-02 DIAGNOSIS — E441 Mild protein-calorie malnutrition: Secondary | ICD-10-CM | POA: Diagnosis not present

## 2023-05-02 DIAGNOSIS — L89313 Pressure ulcer of right buttock, stage 3: Secondary | ICD-10-CM | POA: Diagnosis not present

## 2023-05-02 DIAGNOSIS — R239 Unspecified skin changes: Secondary | ICD-10-CM | POA: Diagnosis not present

## 2023-05-02 DIAGNOSIS — R627 Adult failure to thrive: Secondary | ICD-10-CM | POA: Diagnosis not present

## 2023-05-02 DIAGNOSIS — G300 Alzheimer's disease with early onset: Secondary | ICD-10-CM | POA: Diagnosis not present

## 2023-05-02 DIAGNOSIS — E440 Moderate protein-calorie malnutrition: Secondary | ICD-10-CM | POA: Diagnosis not present

## 2023-05-02 DIAGNOSIS — R5381 Other malaise: Secondary | ICD-10-CM | POA: Diagnosis not present

## 2023-05-02 DIAGNOSIS — M6281 Muscle weakness (generalized): Secondary | ICD-10-CM | POA: Diagnosis not present

## 2023-05-06 DIAGNOSIS — M2450 Contracture, unspecified joint: Secondary | ICD-10-CM | POA: Diagnosis not present

## 2023-05-07 DIAGNOSIS — M2450 Contracture, unspecified joint: Secondary | ICD-10-CM | POA: Diagnosis not present

## 2023-05-08 DIAGNOSIS — L89202 Pressure ulcer of unspecified hip, stage 2: Secondary | ICD-10-CM | POA: Diagnosis not present

## 2023-05-08 DIAGNOSIS — I10 Essential (primary) hypertension: Secondary | ICD-10-CM | POA: Diagnosis not present

## 2023-05-08 DIAGNOSIS — G300 Alzheimer's disease with early onset: Secondary | ICD-10-CM | POA: Diagnosis not present

## 2023-05-08 DIAGNOSIS — E785 Hyperlipidemia, unspecified: Secondary | ICD-10-CM | POA: Diagnosis not present

## 2023-05-08 DIAGNOSIS — J45909 Unspecified asthma, uncomplicated: Secondary | ICD-10-CM | POA: Diagnosis not present

## 2023-05-08 DIAGNOSIS — M2450 Contracture, unspecified joint: Secondary | ICD-10-CM | POA: Diagnosis not present

## 2023-05-08 DIAGNOSIS — G8929 Other chronic pain: Secondary | ICD-10-CM | POA: Diagnosis not present

## 2023-05-09 DIAGNOSIS — R627 Adult failure to thrive: Secondary | ICD-10-CM | POA: Diagnosis not present

## 2023-05-09 DIAGNOSIS — R239 Unspecified skin changes: Secondary | ICD-10-CM | POA: Diagnosis not present

## 2023-05-09 DIAGNOSIS — M6281 Muscle weakness (generalized): Secondary | ICD-10-CM | POA: Diagnosis not present

## 2023-05-09 DIAGNOSIS — E441 Mild protein-calorie malnutrition: Secondary | ICD-10-CM | POA: Diagnosis not present

## 2023-05-09 DIAGNOSIS — L89313 Pressure ulcer of right buttock, stage 3: Secondary | ICD-10-CM | POA: Diagnosis not present

## 2023-05-11 DIAGNOSIS — M2450 Contracture, unspecified joint: Secondary | ICD-10-CM | POA: Diagnosis not present

## 2023-05-11 DIAGNOSIS — R509 Fever, unspecified: Secondary | ICD-10-CM | POA: Diagnosis not present

## 2023-05-11 DIAGNOSIS — R5382 Chronic fatigue, unspecified: Secondary | ICD-10-CM | POA: Diagnosis not present

## 2023-05-12 DIAGNOSIS — M2450 Contracture, unspecified joint: Secondary | ICD-10-CM | POA: Diagnosis not present

## 2023-05-12 DIAGNOSIS — R509 Fever, unspecified: Secondary | ICD-10-CM | POA: Diagnosis not present

## 2023-05-13 DIAGNOSIS — M2450 Contracture, unspecified joint: Secondary | ICD-10-CM | POA: Diagnosis not present

## 2023-05-14 ENCOUNTER — Emergency Department (HOSPITAL_COMMUNITY)

## 2023-05-14 ENCOUNTER — Inpatient Hospital Stay (HOSPITAL_COMMUNITY)
Admission: EM | Admit: 2023-05-14 | Discharge: 2023-05-18 | DRG: 689 | Disposition: A | Discharge status: Skilled Nursing Facility | Source: Skilled Nursing Facility | Attending: Internal Medicine | Admitting: Internal Medicine

## 2023-05-14 ENCOUNTER — Other Ambulatory Visit: Payer: Self-pay

## 2023-05-14 DIAGNOSIS — Z1152 Encounter for screening for COVID-19: Secondary | ICD-10-CM | POA: Diagnosis not present

## 2023-05-14 DIAGNOSIS — N3000 Acute cystitis without hematuria: Secondary | ICD-10-CM | POA: Diagnosis not present

## 2023-05-14 DIAGNOSIS — L89322 Pressure ulcer of left buttock, stage 2: Secondary | ICD-10-CM | POA: Diagnosis not present

## 2023-05-14 DIAGNOSIS — Z7401 Bed confinement status: Secondary | ICD-10-CM | POA: Diagnosis not present

## 2023-05-14 DIAGNOSIS — R404 Transient alteration of awareness: Secondary | ICD-10-CM | POA: Diagnosis not present

## 2023-05-14 DIAGNOSIS — R7881 Bacteremia: Secondary | ICD-10-CM | POA: Diagnosis not present

## 2023-05-14 DIAGNOSIS — Z66 Do not resuscitate: Secondary | ICD-10-CM | POA: Diagnosis not present

## 2023-05-14 DIAGNOSIS — G9340 Encephalopathy, unspecified: Secondary | ICD-10-CM | POA: Diagnosis not present

## 2023-05-14 DIAGNOSIS — I10 Essential (primary) hypertension: Secondary | ICD-10-CM | POA: Diagnosis not present

## 2023-05-14 DIAGNOSIS — Z79899 Other long term (current) drug therapy: Secondary | ICD-10-CM

## 2023-05-14 DIAGNOSIS — R64 Cachexia: Secondary | ICD-10-CM | POA: Diagnosis not present

## 2023-05-14 DIAGNOSIS — Z825 Family history of asthma and other chronic lower respiratory diseases: Secondary | ICD-10-CM

## 2023-05-14 DIAGNOSIS — E870 Hyperosmolality and hypernatremia: Secondary | ICD-10-CM

## 2023-05-14 DIAGNOSIS — E871 Hypo-osmolality and hyponatremia: Secondary | ICD-10-CM | POA: Diagnosis not present

## 2023-05-14 DIAGNOSIS — E869 Volume depletion, unspecified: Secondary | ICD-10-CM | POA: Diagnosis not present

## 2023-05-14 DIAGNOSIS — Z885 Allergy status to narcotic agent status: Secondary | ICD-10-CM | POA: Diagnosis not present

## 2023-05-14 DIAGNOSIS — R569 Unspecified convulsions: Secondary | ICD-10-CM | POA: Diagnosis not present

## 2023-05-14 DIAGNOSIS — N390 Urinary tract infection, site not specified: Secondary | ICD-10-CM | POA: Diagnosis not present

## 2023-05-14 DIAGNOSIS — R059 Cough, unspecified: Secondary | ICD-10-CM | POA: Diagnosis not present

## 2023-05-14 DIAGNOSIS — J4520 Mild intermittent asthma, uncomplicated: Secondary | ICD-10-CM | POA: Diagnosis present

## 2023-05-14 DIAGNOSIS — E876 Hypokalemia: Secondary | ICD-10-CM | POA: Diagnosis present

## 2023-05-14 DIAGNOSIS — G9341 Metabolic encephalopathy: Principal | ICD-10-CM | POA: Diagnosis present

## 2023-05-14 DIAGNOSIS — R4182 Altered mental status, unspecified: Secondary | ICD-10-CM | POA: Diagnosis not present

## 2023-05-14 DIAGNOSIS — B9620 Unspecified Escherichia coli [E. coli] as the cause of diseases classified elsewhere: Secondary | ICD-10-CM | POA: Diagnosis present

## 2023-05-14 DIAGNOSIS — Z833 Family history of diabetes mellitus: Secondary | ICD-10-CM

## 2023-05-14 DIAGNOSIS — F17200 Nicotine dependence, unspecified, uncomplicated: Secondary | ICD-10-CM | POA: Diagnosis present

## 2023-05-14 DIAGNOSIS — R636 Underweight: Secondary | ICD-10-CM | POA: Diagnosis present

## 2023-05-14 DIAGNOSIS — J45909 Unspecified asthma, uncomplicated: Secondary | ICD-10-CM | POA: Diagnosis not present

## 2023-05-14 DIAGNOSIS — Z681 Body mass index (BMI) 19 or less, adult: Secondary | ICD-10-CM

## 2023-05-14 DIAGNOSIS — G300 Alzheimer's disease with early onset: Secondary | ICD-10-CM | POA: Diagnosis present

## 2023-05-14 DIAGNOSIS — Z889 Allergy status to unspecified drugs, medicaments and biological substances status: Secondary | ICD-10-CM

## 2023-05-14 DIAGNOSIS — F02818 Dementia in other diseases classified elsewhere, unspecified severity, with other behavioral disturbance: Secondary | ICD-10-CM | POA: Diagnosis present

## 2023-05-14 DIAGNOSIS — Z8249 Family history of ischemic heart disease and other diseases of the circulatory system: Secondary | ICD-10-CM

## 2023-05-14 DIAGNOSIS — F03918 Unspecified dementia, unspecified severity, with other behavioral disturbance: Secondary | ICD-10-CM | POA: Diagnosis present

## 2023-05-14 DIAGNOSIS — F05 Delirium due to known physiological condition: Secondary | ICD-10-CM | POA: Diagnosis present

## 2023-05-14 DIAGNOSIS — F0283 Dementia in other diseases classified elsewhere, unspecified severity, with mood disturbance: Secondary | ICD-10-CM | POA: Diagnosis present

## 2023-05-14 DIAGNOSIS — Z743 Need for continuous supervision: Secondary | ICD-10-CM | POA: Diagnosis not present

## 2023-05-14 LAB — BLOOD GAS, ARTERIAL
Acid-Base Excess: 7.2 mmol/L — ABNORMAL HIGH (ref 0.0–2.0)
Bicarbonate: 32.6 mmol/L — ABNORMAL HIGH (ref 20.0–28.0)
Drawn by: 31394
O2 Saturation: 100 %
Patient temperature: 36.7
pCO2 arterial: 47 mmHg (ref 32–48)
pH, Arterial: 7.44 (ref 7.35–7.45)
pO2, Arterial: 94 mmHg (ref 83–108)

## 2023-05-14 LAB — CBC WITH DIFFERENTIAL/PLATELET
Abs Immature Granulocytes: 0.01 10*3/uL (ref 0.00–0.07)
Basophils Absolute: 0 10*3/uL (ref 0.0–0.1)
Basophils Relative: 1 %
Eosinophils Absolute: 0 10*3/uL (ref 0.0–0.5)
Eosinophils Relative: 1 %
HCT: 43.7 % (ref 36.0–46.0)
Hemoglobin: 13.1 g/dL (ref 12.0–15.0)
Immature Granulocytes: 0 %
Lymphocytes Relative: 43 %
Lymphs Abs: 2.3 10*3/uL (ref 0.7–4.0)
MCH: 32 pg (ref 26.0–34.0)
MCHC: 30 g/dL (ref 30.0–36.0)
MCV: 106.6 fL — ABNORMAL HIGH (ref 80.0–100.0)
Monocytes Absolute: 0.3 10*3/uL (ref 0.1–1.0)
Monocytes Relative: 5 %
Neutro Abs: 2.7 10*3/uL (ref 1.7–7.7)
Neutrophils Relative %: 50 %
Platelets: 204 10*3/uL (ref 150–400)
RBC: 4.1 MIL/uL (ref 3.87–5.11)
RDW: 13.5 % (ref 11.5–15.5)
WBC: 5.3 10*3/uL (ref 4.0–10.5)
nRBC: 0 % (ref 0.0–0.2)

## 2023-05-14 LAB — COMPREHENSIVE METABOLIC PANEL WITH GFR
ALT: 24 U/L (ref 0–44)
AST: 20 U/L (ref 15–41)
Albumin: 4 g/dL (ref 3.5–5.0)
Alkaline Phosphatase: 76 U/L (ref 38–126)
Anion gap: 9 (ref 5–15)
BUN: 26 mg/dL — ABNORMAL HIGH (ref 8–23)
CO2: 27 mmol/L (ref 22–32)
Calcium: 9.3 mg/dL (ref 8.9–10.3)
Chloride: 118 mmol/L — ABNORMAL HIGH (ref 98–111)
Creatinine, Ser: 0.38 mg/dL — ABNORMAL LOW (ref 0.44–1.00)
GFR, Estimated: >60 mL/min (ref >60)
Glucose, Bld: 95 mg/dL (ref 70–99)
Potassium: 3.1 mmol/L — ABNORMAL LOW (ref 3.5–5.1)
Sodium: 154 mmol/L — ABNORMAL HIGH (ref 135–145)
Total Bilirubin: 0.3 mg/dL (ref 0.0–1.2)
Total Protein: 6.8 g/dL (ref 6.5–8.1)

## 2023-05-14 LAB — URINALYSIS, ROUTINE W REFLEX MICROSCOPIC
Bilirubin Urine: NEGATIVE
Glucose, UA: NEGATIVE mg/dL
Ketones, ur: NEGATIVE mg/dL
Nitrite: POSITIVE — AB
Protein, ur: 100 mg/dL — AB
Specific Gravity, Urine: 1.033 — ABNORMAL HIGH (ref 1.005–1.030)
pH: 5 (ref 5.0–8.0)

## 2023-05-14 LAB — BASIC METABOLIC PANEL WITH GFR
Anion gap: 9 (ref 5–15)
BUN: 25 mg/dL — ABNORMAL HIGH (ref 8–23)
CO2: 28 mmol/L (ref 22–32)
Calcium: 8.8 mg/dL — ABNORMAL LOW (ref 8.9–10.3)
Chloride: 118 mmol/L — ABNORMAL HIGH (ref 98–111)
Creatinine, Ser: 0.51 mg/dL (ref 0.44–1.00)
GFR, Estimated: >60 mL/min (ref >60)
Glucose, Bld: 94 mg/dL (ref 70–99)
Potassium: 3.1 mmol/L — ABNORMAL LOW (ref 3.5–5.1)
Sodium: 155 mmol/L — ABNORMAL HIGH (ref 135–145)

## 2023-05-14 LAB — CBG MONITORING, ED: Glucose-Capillary: 96 mg/dL (ref 70–99)

## 2023-05-14 LAB — RESP PANEL BY RT-PCR (RSV, FLU A&B, COVID)  RVPGX2
Influenza A by PCR: NEGATIVE
Influenza B by PCR: NEGATIVE
Resp Syncytial Virus by PCR: NEGATIVE
SARS Coronavirus 2 by RT PCR: NEGATIVE

## 2023-05-14 LAB — AMMONIA: Ammonia: 44 umol/L — ABNORMAL HIGH (ref 9–35)

## 2023-05-14 MED ORDER — CARBAMAZEPINE 200 MG PO TABS
400.0000 mg | ORAL_TABLET | Freq: Three times a day (TID) | ORAL | Status: DC
  Administered 2023-05-15 – 2023-05-16 (×6): 400 mg via ORAL
  Filled 2023-05-14 (×9): qty 2

## 2023-05-14 MED ORDER — ACETAMINOPHEN 325 MG PO TABS: 650.0000 mg | ORAL_TABLET | Freq: Four times a day (QID) | ORAL | Status: DC | PRN

## 2023-05-14 MED ORDER — PIPERACILLIN-TAZOBACTAM 3.375 G IVPB 30 MIN
3.3750 g | Freq: Once | INTRAVENOUS | Status: AC
  Administered 2023-05-14: 3.375 g via INTRAVENOUS
  Filled 2023-05-14: qty 50

## 2023-05-14 MED ORDER — POTASSIUM CHLORIDE 10 MEQ/100ML IV SOLN
10.0000 meq | INTRAVENOUS | Status: AC
  Administered 2023-05-15 (×4): 10 meq via INTRAVENOUS
  Filled 2023-05-14 (×4): qty 100

## 2023-05-14 MED ORDER — GABAPENTIN 400 MG PO CAPS
400.0000 mg | ORAL_CAPSULE | Freq: Two times a day (BID) | ORAL | Status: DC
  Administered 2023-05-15 – 2023-05-16 (×5): 400 mg via ORAL
  Filled 2023-05-14 (×5): qty 1

## 2023-05-14 MED ORDER — SENNOSIDES-DOCUSATE SODIUM 8.6-50 MG PO TABS: 1.0000 | ORAL_TABLET | Freq: Every evening | ORAL | Status: DC | PRN

## 2023-05-14 MED ORDER — ACETAMINOPHEN 650 MG RE SUPP: 650.0000 mg | Freq: Four times a day (QID) | RECTAL | Status: DC | PRN

## 2023-05-14 MED ORDER — SODIUM CHLORIDE 0.9% FLUSH
3.0000 mL | Freq: Two times a day (BID) | INTRAVENOUS | Status: DC
  Administered 2023-05-15 – 2023-05-18 (×8): 3 mL via INTRAVENOUS

## 2023-05-14 MED ORDER — HEPARIN SODIUM (PORCINE) 5000 UNIT/ML IJ SOLN
5000.0000 [IU] | Freq: Three times a day (TID) | INTRAMUSCULAR | Status: DC
  Administered 2023-05-15 – 2023-05-18 (×11): 5000 [IU] via SUBCUTANEOUS
  Filled 2023-05-14 (×11): qty 1

## 2023-05-14 MED ORDER — LACTATED RINGERS IV BOLUS
1000.0000 mL | Freq: Once | INTRAVENOUS | Status: AC
  Administered 2023-05-14: 1000 mL via INTRAVENOUS

## 2023-05-14 MED ORDER — HYDROXYZINE HCL 25 MG PO TABS
12.5000 mg | ORAL_TABLET | Freq: Three times a day (TID) | ORAL | Status: DC
  Administered 2023-05-15 – 2023-05-16 (×5): 12.5 mg via ORAL
  Filled 2023-05-14 (×5): qty 1

## 2023-05-14 MED ORDER — DEXTROSE 5 % IV SOLN: INTRAVENOUS | Status: DC

## 2023-05-14 MED ORDER — SODIUM CHLORIDE 0.9 % IV SOLN
1.0000 g | INTRAVENOUS | Status: DC
  Administered 2023-05-15 – 2023-05-17 (×4): 1 g via INTRAVENOUS
  Filled 2023-05-14 (×4): qty 10

## 2023-05-14 MED ORDER — ONDANSETRON HCL 4 MG/2ML IJ SOLN: 4.0000 mg | Freq: Four times a day (QID) | INTRAMUSCULAR | Status: DC | PRN

## 2023-05-14 MED ORDER — ONDANSETRON HCL 4 MG PO TABS
4.0000 mg | ORAL_TABLET | Freq: Four times a day (QID) | ORAL | Status: DC | PRN
Start: 2023-05-14 — End: 2023-05-19

## 2023-05-14 NOTE — H&P (Signed)
 History and Physical    Sherry Hicks NGE:[id number] DOB: 1960-08-01 DOA: 05/14/2023  PCP: Sherron Monday, MD  Patient coming from: [facility] nursing facility  I have personally briefly reviewed patient's old medical records in [facility] Link  Chief Complaint: Altered mental status  HPI: Sherry Hicks is a 63 y.o. female with medical history significant for advanced Alzheimer's dementia with chronic bedbound and nonverbal status, contractures to bilateral lower extremities who presented to the ED from her facility for evaluation of change in mental status.  Patient is unable to provide any history due to advanced dementia and is otherwise supplemented by EDP, chart review, and daughter by phone.  Patient brought to the ED via EMS from [facility] nursing facilities for diminished mentation and level of interaction.  She has advanced early onset Alzheimer's dementia.  She is chronically bedbound and largely nonverbal.  On arrival to the ED she was very somnolent and difficult to arouse.  She withdraw only to painful stimuli.  At time of admitting exam patient appears to be more alert.  Eyes are open and she tracks.  She does not follow commands.  She says "no" once but otherwise does not speak.  ED Course  Labs/Imaging on admission: I have personally reviewed following labs and imaging studies.  Initial vitals showed BP 114/82, pulse 80, RR 12, temp 98.1 F rectally, SpO2 98% on room air.  Labs showed sodium 154, potassium 3.1, bicarb 27, chloride 119, BUN 26, creatinine 0.38, serum glucose 95, LFTs within normal limits, ammonia 44, WBC 5.3, hemoglobin 13.1, platelets 204.  ABG pH 7.44, pCO2 47, pO2 94.  SARS-CoV-2, influenza, RSV PCR negative.  UA showed 100 protein, positive nitrates, small leukocytes, 0-5 RBCs, 21-50 WBCs, many bacteria.  Blood cultures in process.  Portable chest x-ray negative for focal consolidation, edema, effusion.  CT head without contrast  negative for acute intracranial process.  Stable moderate chronic dilatation of the ventricular system noted.  Patient was given IV Zosyn and 1 L LR.  The hospitalist service was consulted to admit.  Review of Systems:  Unable to obtain review of systems from patient due to advanced dementia.  Past Medical History:  Diagnosis Date   Alteration in performance of activities of daily living 08/29/2019   Apraxia 08/29/2019   Arthritis    Dementia (HCC)    Dementia with behavioral problem (HCC)    History of asthma, mild, intermittent 02/14/2015   Hypertension    Smoker     Past Surgical History:  Procedure Laterality Date   ANKLE SURGERY     SALPINGOOPHORECTOMY     TOTAL ABDOMINAL HYSTERECTOMY W/ BILATERAL SALPINGOOPHORECTOMY     for menorrhagia and pregnancy loss) and no longer requires GYN screening    Social History: Patient unable to communicate to provide social history.  Allergies  Allergen Reactions   Hydrocodone-Acetaminophen Nausea And Vomiting and Other (See Comments)    GI Intolerance and "allergic," per [facility]   Donepezil Other (See Comments)    Urinary Incontinence and "allergic," per [facility]    Family History  Problem Relation Age of Onset   Hypertension Mother    Dementia Mother    Asthma Father    Cancer Father    Canavan disease Sister    Cancer Brother    Diabetes Maternal Grandmother    Hypertension Maternal Grandmother    Asthma Paternal Grandfather    Cancer Paternal Grandfather    Cancer Other    Diabetes Other  Dementia Paternal Grandmother      Prior to Admission medications   Medication Sig Start Date End Date Taking? Authorizing Provider  acetaminophen (TYLENOL) 500 MG tablet Take 500 mg by mouth every 6 (six) hours as needed for moderate pain.    [provider]  carbamazepine (TEGRETOL) 200 MG tablet Take 2 tablets (400 mg total) by mouth 2 (two) times daily. 05/28/20   Russella Dar, NP  carbamazepine (TEGRETOL) 200 MG tablet Take  2 tablets (400 mg total) by mouth every evening. 05/28/20   Russella Dar, NP  citalopram (CELEXA) 40 MG tablet Take 1 tablet (40 mg total) by mouth daily. 05/28/20   Russella Dar, NP  Ensure Plus (ENSURE PLUS) LIQD Take 1 Can by mouth 2 (two) times daily between meals. Patient not taking: No sig reported 11/18/19   [name], [name], DO  gabapentin (NEURONTIN) 400 MG capsule Take 1 capsule (400 mg total) by mouth daily after lunch. 06/10/20   Zannie Cove, MD  LORazepam (ATIVAN) 0.5 MG tablet Take 1 tablet (0.5 mg total) by mouth 2 (two) times daily. 05/28/20   Russella Dar, NP  QUEtiapine (SEROQUEL) 100 MG tablet Take 1 tablet (100 mg total) by mouth 2 (two) times daily. 05/28/20   Russella Dar, NP  QUEtiapine (SEROQUEL) 50 MG tablet Take 3 tablets (150 mg total) by mouth at bedtime. 05/28/20   Russella Dar, NP    Physical Exam: Vitals:   05/14/23 1900 05/14/23 2046 05/14/23 2130 05/14/23 2145  BP: 136/82  121/70   Pulse: 66  81 79  Resp: 11  10 10   Temp:  98 F (36.7 C)    TempSrc:  Axillary    SpO2: 98%  100% 99%  Weight:      Height:       Exam limited due to advanced dementia Constitutional: Ackley ill-appearing thin woman resting in bed Eyes: Eyes open and tracks, EOMI, lids and conjunctivae normal ENMT: Mucous membranes are dry, grinding teeth Neck: normal, supple, no masses. Respiratory: clear to auscultation anteriorly.  Cardiovascular: Regular rate and rhythm, no murmurs / rubs / gallops. No extremity edema.  Abdomen: no BS tenderness, no masses palpated. Musculoskeletal: Thin extremities with muscle wasting throughout, contractures bilateral lower extremities Skin: no rashes, lesions, ulcers. No induration Neurologic: Sensation upper ears intact. Strength diminished throughout. Psychiatric: Awake, eyes open, says "no" but otherwise nonverbal. EKG: Personally reviewed. Sinus rhythm, rate 76, no acute ischemic changes.  Assessment/Plan Principal  Problem:   Hypernatremia Active Problems:   Urinary tract infection   Early Onset Dementia with behavioral problem (HCC)   Hypokalemia   Acute metabolic encephalopathy   Do not resuscitate   Sherry Hicks is a 63 y.o. female with medical history significant for advanced Alzheimer's dementia with chronic bedbound and nonverbal status, contractures to bilateral lower extremities who is admitted with acute metabolic encephalopathy superimposed on dementia secondary to UTI and dehydration with hypernatremia.  Assessment and Plan: Hypernatremia: Sodium 154, volume depleted on admission.  Likely a consequence of UTI. -Start D5W infusion overnight -Repeat labs in a.m.  Urinary tract infection: Urinalysis consistent with UTI.  Add on urine culture.  Start IV ceftriaxone.  Acute metabolic encephalopathy superimposed on advanced dementia: Patient is bedbound, nonverbal, cachectic at baseline.  Noted to have decreased level of interaction at her facility compared to her baseline.  Mentation seems to been improving towards baseline after receiving initial antibiotics and IV fluids. -Continue IV fluids and antibiotics as  above -Delirium and fall precautions -Continue Tegretol, gabapentin, Atarax  Hypokalemia: IV supplementation ordered.  Goals of care: Discussed with patient's daughter by phone on admission.  CODE STATUS is confirmed DNR/DNI.   DVT prophylaxis: heparin injection 5,000 Units Start: 05/14/23 2230 Code Status:   Code Status: Limited: Do not attempt resuscitation (DNR) -DNR-LIMITED -Do Not Intubate/DNI confirmed with patient's daughter by phone on admission. Family Communication: Daughter by phone on admission Disposition Plan: From [place] [facility] SN[facility] and likely return to same facility pending clinical progress Consults called: None Severity of Illness: The appropriate patient status for this patient is OBSERVATION. Observation status is judged to be reasonable and necessary  in order to provide the required intensity of service to ensure the patient's safety. The patient's presenting symptoms, physical exam findings, and initial radiographic and laboratory data in the context of their medical condition is felt to place them at decreased risk for further clinical deterioration. Furthermore, it is anticipated that the patient will be medically stable for discharge from the hospital within 2 midnights of admission.   Darreld Mclean MD [facility]  If 7PM-7AM, please contact night-coverage [email or web address]  05/14/2023, 10:37 PM

## 2023-05-14 NOTE — ED Triage Notes (Signed)
 Patient to ED by EMS from [facility] for AMS and abnormal labs. Per EMS Facility called stating patient has not been herself and severely altered. She has HX of dementia and is bed bound. EMS place 18g in R forearm. CBG: 98 80 98% RA 102/60

## 2023-05-14 NOTE — ED Notes (Signed)
 Hospitalist at the bedside

## 2023-05-14 NOTE — ED Provider Notes (Signed)
 [place] EMERGENCY DEPARTMENT AT [facility] Provider Note  CSN: [id number] Arrival date & time: 05/14/23 1534  Chief Complaint(s) Altered Mental Status  HPI Sherry Hicks is a 63 y.o. female with history of early onset dementia who is here today from her skilled nursing facility due to altered mental status.  History obtained via EMS who report that they were called by nursing home staff after the patient was found to be less responsive.  They report that at baseline, patient is alert, conversant.  When I checked on the patient this morning, she was not responding to anything but painful stimuli.  Her last known well would be last evening.  Patient does not ambulate.   Past Medical History Past Medical History:  Diagnosis Date   Alteration in performance of activities of daily living 08/29/2019   Apraxia 08/29/2019   Arthritis    Dementia (HCC)    Dementia with behavioral problem (HCC)    History of asthma, mild, intermittent 02/14/2015   Hypertension    Smoker    Patient Active Problem List   Diagnosis Date Noted   Dementia due to another general medical condition, with behavioral disturbance (HCC)    Pressure ulcer of right hip, stage 2 (HCC)    Alzheimer disease (HCC) 04/04/2020   Pressure injury of skin 04/02/2020   Protein-calorie malnutrition, moderate (HCC)    Transaminitis    Dementia with behavioral disturbance (HCC) 03/24/2020   History of mood disorder 08/29/2019   Apraxia 08/29/2019   Alteration in performance of activities of daily living 08/29/2019   Caregiver stress 08/29/2019   Early Onset Dementia with behavioral problem (HCC)    Hyperlipidemia 07/30/2019   Weight loss, unintentional 07/06/2019   Hypernatremia 07/06/2019   History of asthma, mild, intermittent 2017   Major depressive disorder, recurrent episode (HCC) 04/12/2006   HYPERTENSION, BENIGN SYSTEMIC 04/12/2006   Home Medication(s) Prior to Admission medications   Medication Sig  Start Date End Date Taking? Authorizing Provider  acetaminophen (TYLENOL) 500 MG tablet Take 500 mg by mouth every 6 (six) hours as needed for moderate pain.    [provider]  carbamazepine (TEGRETOL) 200 MG tablet Take 2 tablets (400 mg total) by mouth 2 (two) times daily. 05/28/20   Russella Dar, NP  carbamazepine (TEGRETOL) 200 MG tablet Take 2 tablets (400 mg total) by mouth every evening. 05/28/20   Russella Dar, NP  citalopram (CELEXA) 40 MG tablet Take 1 tablet (40 mg total) by mouth daily. 05/28/20   Russella Dar, NP  Ensure Plus (ENSURE PLUS) LIQD Take 1 Can by mouth 2 (two) times daily between meals. Patient not taking: No sig reported 11/18/19   [name], [name], DO  gabapentin (NEURONTIN) 400 MG capsule Take 1 capsule (400 mg total) by mouth daily after lunch. 06/10/20   Zannie Cove, MD  LORazepam (ATIVAN) 0.5 MG tablet Take 1 tablet (0.5 mg total) by mouth 2 (two) times daily. 05/28/20   Russella Dar, NP  QUEtiapine (SEROQUEL) 100 MG tablet Take 1 tablet (100 mg total) by mouth 2 (two) times daily. 05/28/20   Russella Dar, NP  QUEtiapine (SEROQUEL) 50 MG tablet Take 3 tablets (150 mg total) by mouth at bedtime. 05/28/20   Russella Dar, NP  Past Surgical History Past Surgical History:  Procedure Laterality Date   ANKLE SURGERY     SALPINGOOPHORECTOMY     TOTAL ABDOMINAL HYSTERECTOMY W/ BILATERAL SALPINGOOPHORECTOMY     for menorrhagia and pregnancy loss) and no longer requires GYN screening   Family History Family History  Problem Relation Age of Onset   Hypertension Mother    Dementia Mother    Asthma Father    Cancer Father    Canavan disease Sister    Cancer Brother    Diabetes Maternal Grandmother    Hypertension Maternal Grandmother    Asthma Paternal Grandfather    Cancer Paternal Grandfather    Cancer  Other    Diabetes Other    Dementia Paternal Grandmother     Social History Social History   Tobacco Use   Smoking status: Every Day   Smokeless tobacco: Never  Vaping Use   Vaping status: Never Used  Substance Use Topics   Alcohol use: No   Drug use: No   Allergies Hydrocodone-acetaminophen and Donepezil  Review of Systems Review of Systems  Physical Exam Vital Signs  I have reviewed the triage vital signs BP 136/82   Pulse 66   Temp 98.1 F (36.7 C) (Rectal)   Resp 11   Ht 5\' 5"  (1.651 m)   Wt 41 kg   SpO2 98%   BMI 15.04 kg/m   Physical Exam Vitals reviewed.  Constitutional:      Comments: Patient with contractures in the upper and lower extremities.  Eyes are closed, breathing regularly.  HENT:     Head: Normocephalic.     Mouth/Throat:     Mouth: Mucous membranes are moist.  Eyes:     Pupils: Pupils are equal, round, and reactive to light.  Cardiovascular:     Rate and Rhythm: Normal rate.  Pulmonary:     Effort: Pulmonary effort is normal.  Abdominal:     General: Abdomen is flat. There is no distension.     Palpations: Abdomen is soft.     Tenderness: There is no guarding.  Musculoskeletal:     Right lower leg: No edema.     Left lower leg: No edema.  Skin:    General: Skin is warm and dry.     Findings: Erythema present.  Neurological:     Comments: Patient withdraws to painful stimuli and all limbs.  Eyes remain closed.  She does not respond to voice.  There is no spontaneous movement.     ED Results and Treatments Labs (all labs ordered are listed, but only abnormal results are displayed) Labs Reviewed  CBC WITH DIFFERENTIAL/PLATELET - Abnormal; Notable for the following components:      Result Value   MCV 106.6 (*)    All other components within normal limits  COMPREHENSIVE METABOLIC PANEL WITH GFR - Abnormal; Notable for the following components:   Sodium 154 (*)    Potassium 3.1 (*)    Chloride 118 (*)    BUN 26 (*)     Creatinine, Ser 0.38 (*)    All other components within normal limits  URINALYSIS, ROUTINE W REFLEX MICROSCOPIC - Abnormal; Notable for the following components:   Color, Urine AMBER (*)    APPearance HAZY (*)    Specific Gravity, Urine 1.033 (*)    Hgb urine dipstick SMALL (*)    Protein, ur 100 (*)    Nitrite POSITIVE (*)    Leukocytes,Ua SMALL (*)    Bacteria, UA MANY (*)  All other components within normal limits  AMMONIA - Abnormal; Notable for the following components:   Ammonia 44 (*)    All other components within normal limits  BLOOD GAS, ARTERIAL - Abnormal; Notable for the following components:   Bicarbonate 32.6 (*)    Acid-Base Excess 7.2 (*)    All other components within normal limits  RESP PANEL BY RT-PCR (RSV, FLU A&B, COVID)  RVPGX2  CULTURE, BLOOD (ROUTINE X 2)  CULTURE, BLOOD (ROUTINE X 2)  LACTIC ACID, PLASMA  BASIC METABOLIC PANEL WITH GFR  CBG MONITORING, ED                                                                                                                          Radiology DG Chest Portable 1 View Result Date: 05/14/2023 CLINICAL DATA:  Cough.  Altered mental status. EXAM: PORTABLE CHEST 1 VIEW COMPARISON:  Radiographs 10/27/2022 and 02/22/2022. FINDINGS: 1611 hours. Moderate rotation to the left. Allowing for this, the heart size and mediastinal contours are stable. The lungs appear clear. There is no evidence of pleural effusion or pneumothorax. The bones appear unchanged. Telemetry leads overlie the chest. IMPRESSION: No evidence of acute cardiopulmonary process. Rotated exam. Electronically Signed   By: Carey Bullocks M.D.   On: 05/14/2023 16:43    Pertinent labs & imaging results that were available during my care of the patient were reviewed by me and considered in my medical decision making (see MDM for details).  Medications Ordered in ED Medications  piperacillin-tazobactam (ZOSYN) IVPB 3.375 g (0 g Intravenous Stopped 05/14/23 1816)   lactated ringers bolus 1,000 mL (0 mLs Intravenous Stopped 05/14/23 1916)                                                                                                                                     Procedures .Critical Care  Performed by: Arletha Pili, DO Authorized by: Arletha Pili, DO   Critical care provider statement:    Critical care time (minutes):  88   Critical care was necessary to treat or prevent imminent or life-threatening deterioration of the following conditions:  Metabolic crisis and CNS failure or compromise   Critical care was time spent personally by me on the following activities:  Development of treatment plan with patient or surrogate, discussions with consultants, evaluation of patient's response to treatment, examination of patient, ordering and  review of laboratory studies, ordering and review of radiographic studies, ordering and performing treatments and interventions, pulse oximetry, re-evaluation of patient's condition and review of old charts   (including critical care time)  Medical Decision Making / ED Course   This patient presents to the ED for concern of altered mental status, this involves an extensive number of treatment options, and is a complaint that carries with it a high risk of complications and morbidity.  The differential diagnosis includes CVA, metabolic encephalopathy, infection, iatrogenic.  MDM: Patient remarkably has normal vital signs.  On exam, she is minimally responsive.  I do not appreciate any overt trauma.  Patient had full skin exam performed, no significant breakdown.  Her rectal temp is normal.  Lower suspicion for infectious cause.  I do not see any opiates in the patient's medication list provided by EMS staff.  Patient with a last known well of last evening.  With her advanced dementia, I am reluctant to intubate this patient for airway protection at this time.  Patient is maintaining her airway, O2 sats  appropriate.  She is not requiring any supplemental oxygen or assistance at this moment.  Unclear etiology for her presenting symptoms.  Reassessment 8:15 PM-patient with a urinary tract infection, hyponatremia.  Believe this is metabolic encephalopathy.  Have treated with Rocephin.  My dependent review the patient's head CT shows no intracranial hemorrhage.  Will admit to hospitalist.   Of note, I spoke with the patient's daughter, Sherry Hicks who is the POA.  She is patient's only daughter.  She reports that the patient is a DNR DNI.   I do not believe patient has sepsis at this time.   Additional history obtained: -Additional history obtained from  -External records from outside source obtained and reviewed including: Chart review including previous notes, labs, imaging, consultation notes   Lab Tests: -I ordered, reviewed, and interpreted labs.   The pertinent results include:   Labs Reviewed  CBC WITH DIFFERENTIAL/PLATELET - Abnormal; Notable for the following components:      Result Value   MCV 106.6 (*)    All other components within normal limits  COMPREHENSIVE METABOLIC PANEL WITH GFR - Abnormal; Notable for the following components:   Sodium 154 (*)    Potassium 3.1 (*)    Chloride 118 (*)    BUN 26 (*)    Creatinine, Ser 0.38 (*)    All other components within normal limits  URINALYSIS, ROUTINE W REFLEX MICROSCOPIC - Abnormal; Notable for the following components:   Color, Urine AMBER (*)    APPearance HAZY (*)    Specific Gravity, Urine 1.033 (*)    Hgb urine dipstick SMALL (*)    Protein, ur 100 (*)    Nitrite POSITIVE (*)    Leukocytes,Ua SMALL (*)    Bacteria, UA MANY (*)    All other components within normal limits  AMMONIA - Abnormal; Notable for the following components:   Ammonia 44 (*)    All other components within normal limits  BLOOD GAS, ARTERIAL - Abnormal; Notable for the following components:   Bicarbonate 32.6 (*)    Acid-Base Excess 7.2  (*)    All other components within normal limits  RESP PANEL BY RT-PCR (RSV, FLU A&B, COVID)  RVPGX2  CULTURE, BLOOD (ROUTINE X 2)  CULTURE, BLOOD (ROUTINE X 2)  LACTIC ACID, PLASMA  BASIC METABOLIC PANEL WITH GFR  CBG MONITORING, ED      EKG my independent review of the  patient's EKG shows no ST segment depressions or elevations, no T wave inversions, no evidence of acute ischemia.  EKG Interpretation Date/Time:    Ventricular Rate:    PR Interval:    QRS Duration:    QT Interval:    QTC Calculation:   R Axis:      Text Interpretation:           Imaging Studies ordered: I ordered imaging studies including chest x-ray, head CT I independently visualized and interpreted imaging. I agree with the radiologist interpretation   Medicines ordered and prescription drug management: Meds ordered this encounter  Medications   piperacillin-tazobactam (ZOSYN) IVPB 3.375 g    Antibiotic Indication::   Sepsis   lactated ringers bolus 1,000 mL    -I have reviewed the patients home medicines and have made adjustments as needed  Critical interventions Management of metabolic encephalopathy   Cardiac Monitoring: The patient was maintained on a cardiac monitor.  I personally viewed and interpreted the cardiac monitored which showed an underlying rhythm of: Normal sinus rhythm  Social Determinants of Health:  Factors impacting patients care include: Dementia   Reevaluation: After the interventions noted above, I reevaluated the patient and found that they have :improved  Co morbidities that complicate the patient evaluation  Past Medical History:  Diagnosis Date   Alteration in performance of activities of daily living 08/29/2019   Apraxia 08/29/2019   Arthritis    Dementia (HCC)    Dementia with behavioral problem (HCC)    History of asthma, mild, intermittent 02/14/2015   Hypertension    Smoker       Dispostion: Admission     Final Clinical Impression(s) / ED  Diagnoses Final diagnoses:  Metabolic encephalopathy  Acute cystitis without hematuria     @PCDICTATION @    Anders Simmonds T, DO 05/14/23 2021

## 2023-05-14 NOTE — Hospital Course (Signed)
 Sherry Hicks is a 63 y.o. female with medical history significant for advanced Alzheimer's dementia with chronic bedbound and nonverbal status, contractures to bilateral lower extremities who is admitted with acute metabolic encephalopathy superimposed on dementia secondary to UTI and dehydration with hypernatremia.

## 2023-05-15 ENCOUNTER — Encounter (HOSPITAL_COMMUNITY): Payer: Self-pay | Admitting: Internal Medicine

## 2023-05-15 DIAGNOSIS — I10 Essential (primary) hypertension: Secondary | ICD-10-CM | POA: Diagnosis present

## 2023-05-15 DIAGNOSIS — Z66 Do not resuscitate: Secondary | ICD-10-CM | POA: Diagnosis present

## 2023-05-15 DIAGNOSIS — I6782 Cerebral ischemia: Secondary | ICD-10-CM | POA: Diagnosis not present

## 2023-05-15 DIAGNOSIS — F05 Delirium due to known physiological condition: Secondary | ICD-10-CM | POA: Diagnosis present

## 2023-05-15 DIAGNOSIS — F02818 Dementia in other diseases classified elsewhere, unspecified severity, with other behavioral disturbance: Secondary | ICD-10-CM | POA: Diagnosis present

## 2023-05-15 DIAGNOSIS — Z7401 Bed confinement status: Secondary | ICD-10-CM | POA: Diagnosis not present

## 2023-05-15 DIAGNOSIS — E869 Volume depletion, unspecified: Secondary | ICD-10-CM | POA: Diagnosis present

## 2023-05-15 DIAGNOSIS — Z833 Family history of diabetes mellitus: Secondary | ICD-10-CM | POA: Diagnosis not present

## 2023-05-15 DIAGNOSIS — R636 Underweight: Secondary | ICD-10-CM | POA: Diagnosis present

## 2023-05-15 DIAGNOSIS — F17200 Nicotine dependence, unspecified, uncomplicated: Secondary | ICD-10-CM | POA: Diagnosis present

## 2023-05-15 DIAGNOSIS — N390 Urinary tract infection, site not specified: Secondary | ICD-10-CM | POA: Diagnosis present

## 2023-05-15 DIAGNOSIS — R404 Transient alteration of awareness: Secondary | ICD-10-CM | POA: Diagnosis not present

## 2023-05-15 DIAGNOSIS — Z79899 Other long term (current) drug therapy: Secondary | ICD-10-CM | POA: Diagnosis not present

## 2023-05-15 DIAGNOSIS — B9620 Unspecified Escherichia coli [E. coli] as the cause of diseases classified elsewhere: Secondary | ICD-10-CM | POA: Diagnosis present

## 2023-05-15 DIAGNOSIS — G9341 Metabolic encephalopathy: Secondary | ICD-10-CM | POA: Diagnosis present

## 2023-05-15 DIAGNOSIS — E870 Hyperosmolality and hypernatremia: Secondary | ICD-10-CM | POA: Diagnosis present

## 2023-05-15 DIAGNOSIS — F0283 Dementia in other diseases classified elsewhere, unspecified severity, with mood disturbance: Secondary | ICD-10-CM | POA: Diagnosis present

## 2023-05-15 DIAGNOSIS — G300 Alzheimer's disease with early onset: Secondary | ICD-10-CM | POA: Diagnosis present

## 2023-05-15 DIAGNOSIS — E876 Hypokalemia: Secondary | ICD-10-CM | POA: Diagnosis present

## 2023-05-15 DIAGNOSIS — L89322 Pressure ulcer of left buttock, stage 2: Secondary | ICD-10-CM | POA: Diagnosis present

## 2023-05-15 DIAGNOSIS — R7881 Bacteremia: Secondary | ICD-10-CM | POA: Diagnosis not present

## 2023-05-15 DIAGNOSIS — Z885 Allergy status to narcotic agent status: Secondary | ICD-10-CM | POA: Diagnosis not present

## 2023-05-15 DIAGNOSIS — E871 Hypo-osmolality and hyponatremia: Secondary | ICD-10-CM | POA: Diagnosis not present

## 2023-05-15 DIAGNOSIS — R64 Cachexia: Secondary | ICD-10-CM | POA: Diagnosis present

## 2023-05-15 DIAGNOSIS — J4520 Mild intermittent asthma, uncomplicated: Secondary | ICD-10-CM | POA: Diagnosis present

## 2023-05-15 DIAGNOSIS — R569 Unspecified convulsions: Secondary | ICD-10-CM | POA: Diagnosis not present

## 2023-05-15 DIAGNOSIS — R4182 Altered mental status, unspecified: Secondary | ICD-10-CM | POA: Diagnosis not present

## 2023-05-15 DIAGNOSIS — Z1152 Encounter for screening for COVID-19: Secondary | ICD-10-CM | POA: Diagnosis not present

## 2023-05-15 DIAGNOSIS — Z681 Body mass index (BMI) 19 or less, adult: Secondary | ICD-10-CM | POA: Diagnosis not present

## 2023-05-15 LAB — BLOOD CULTURE ID PANEL (REFLEXED) - BCID2

## 2023-05-15 LAB — CBC
HCT: 37.3 % (ref 36.0–46.0)
Hemoglobin: 11.7 g/dL — ABNORMAL LOW (ref 12.0–15.0)
MCH: 32.3 pg (ref 26.0–34.0)
MCHC: 31.4 g/dL (ref 30.0–36.0)
MCV: 103 fL — ABNORMAL HIGH (ref 80.0–100.0)
Platelets: 174 10*3/uL (ref 150–400)
RBC: 3.62 MIL/uL — ABNORMAL LOW (ref 3.87–5.11)
RDW: 13 % (ref 11.5–15.5)
WBC: 4.7 10*3/uL (ref 4.0–10.5)
nRBC: 0 % (ref 0.0–0.2)

## 2023-05-15 LAB — BASIC METABOLIC PANEL WITH GFR
Anion gap: 7 (ref 5–15)
BUN: 19 mg/dL (ref 8–23)
CO2: 25 mmol/L (ref 22–32)
Calcium: 8.7 mg/dL — ABNORMAL LOW (ref 8.9–10.3)
Chloride: 116 mmol/L — ABNORMAL HIGH (ref 98–111)
Creatinine, Ser: 0.3 mg/dL — ABNORMAL LOW (ref 0.44–1.00)
GFR, Estimated: >60 mL/min (ref >60)
Glucose, Bld: 114 mg/dL — ABNORMAL HIGH (ref 70–99)
Potassium: 3.4 mmol/L — ABNORMAL LOW (ref 3.5–5.1)
Sodium: 148 mmol/L — ABNORMAL HIGH (ref 135–145)

## 2023-05-15 LAB — HIV ANTIBODY (ROUTINE TESTING W REFLEX): HIV Screen 4th Generation wRfx: NONREACTIVE

## 2023-05-15 LAB — SODIUM
Sodium: 140 mmol/L (ref 135–145)
Sodium: 141 mmol/L (ref 135–145)

## 2023-05-15 LAB — MRSA NEXT GEN BY PCR, NASAL: MRSA by PCR Next Gen: NOT DETECTED

## 2023-05-15 LAB — LACTIC ACID, PLASMA: Lactic Acid, Venous: 1.6 mmol/L (ref 0.5–1.9)

## 2023-05-15 MED ORDER — DEXTROSE 5 % IV SOLN: INTRAVENOUS | Status: AC

## 2023-05-15 MED ORDER — VANCOMYCIN HCL 750 MG/150ML IV SOLN
750.0000 mg | INTRAVENOUS | Status: DC
  Administered 2023-05-15 – 2023-05-17 (×2): 750 mg via INTRAVENOUS
  Filled 2023-05-15 (×2): qty 150

## 2023-05-15 MED ORDER — POTASSIUM CHLORIDE 10 MEQ/100ML IV SOLN
10.0000 meq | INTRAVENOUS | Status: AC
  Administered 2023-05-15 (×4): 10 meq via INTRAVENOUS
  Filled 2023-05-15 (×4): qty 100

## 2023-05-15 NOTE — Progress Notes (Signed)
 PHARMACY - PHYSICIAN COMMUNICATION CRITICAL VALUE ALERT - BLOOD CULTURE IDENTIFICATION (BCID)  Sherry Hicks is an 63 y.o. female who presented to [facility] on 05/14/2023 with a chief complaint of altered mental status.    Assessment:   UA showed 100 protein, positive nitrates, small leukocytes, 0-5 RBCs, 21-50 WBCs, many bacteria.  Portable chest x-ray negative for focal consolidation, edema, effusion.  3/31 UCx: sent 3/31 20:39 BCx (one bottle): NG < 12 hrs 3/31 17:56 BCx (two bottles):  GPR in aerobic bottle only; considered contaminant. GPC in anaerobic bottle only.  BCID detected staphylococcus species (no methicillin resistance detected.  Not S. Aureus, epidermidis or lugdunensis).   Name of physician (or Provider) Contacted: [place]  Current antibiotics: Ceftriaxone 1 g IV q24h for UTI  Changes to prescribed antibiotics recommended:  Recommendations accepted by provider to add IV vancomycin per Pharmacy Consult  Results for orders placed or performed during the hospital encounter of 05/14/23  Blood Culture ID Panel (Reflexed) (Collected: 05/14/2023  5:56 PM)  Result Value Ref Range   Enterococcus faecalis NOT DETECTED NOT DETECTED   Enterococcus Faecium NOT DETECTED NOT DETECTED   Listeria monocytogenes NOT DETECTED NOT DETECTED   Staphylococcus species DETECTED (A) NOT DETECTED   Staphylococcus aureus (BCID) NOT DETECTED NOT DETECTED   Staphylococcus epidermidis NOT DETECTED NOT DETECTED   Staphylococcus lugdunensis NOT DETECTED NOT DETECTED   Streptococcus species NOT DETECTED NOT DETECTED   Streptococcus agalactiae NOT DETECTED NOT DETECTED   Streptococcus pneumoniae NOT DETECTED NOT DETECTED   Streptococcus pyogenes NOT DETECTED NOT DETECTED   A.calcoaceticus-baumannii NOT DETECTED NOT DETECTED   Bacteroides fragilis NOT DETECTED NOT DETECTED   Enterobacterales NOT DETECTED NOT DETECTED   Enterobacter cloacae complex NOT DETECTED NOT DETECTED   Escherichia coli  NOT DETECTED NOT DETECTED   Klebsiella aerogenes NOT DETECTED NOT DETECTED   Klebsiella oxytoca NOT DETECTED NOT DETECTED   Klebsiella pneumoniae NOT DETECTED NOT DETECTED   Proteus species NOT DETECTED NOT DETECTED   Salmonella species NOT DETECTED NOT DETECTED   Serratia marcescens NOT DETECTED NOT DETECTED   Haemophilus influenzae NOT DETECTED NOT DETECTED   Neisseria meningitidis NOT DETECTED NOT DETECTED   Pseudomonas aeruginosa NOT DETECTED NOT DETECTED   Stenotrophomonas maltophilia NOT DETECTED NOT DETECTED   Candida albicans NOT DETECTED NOT DETECTED   Candida auris NOT DETECTED NOT DETECTED   Candida glabrata NOT DETECTED NOT DETECTED   Candida krusei NOT DETECTED NOT DETECTED   Candida parapsilosis NOT DETECTED NOT DETECTED   Candida tropicalis NOT DETECTED NOT DETECTED   Cryptococcus neoformans/gattii NOT DETECTED NOT DETECTED    Lynden Ang, PharmD, BCPS 05/15/2023  3:47 PM

## 2023-05-15 NOTE — Progress Notes (Signed)
 Pharmacy Antibiotic Note  Sherry Hicks is a 63 y.o. female admitted on 05/14/2023 with  altered mental status .   PMH for advanced Alzheimer's dementia with chronic bedbound and nonverbal status, contractures to bilateral lower extremities who presented to the ED from her facility for evaluation of change in mental status. Currently on Rocephin for UTI per provider.  Pharmacy has been consulted for vancomycin dosing for possible bacteremia.  Plan: Vancomycin 750 mg IV every 36 hours (Goal AUC 400-550, TBW, SCr used: rounded up to 0.9-1.0 due to chronic bedbound status, est AUC 431.9-474) Continue Rocephin 1 g IV every 24 hours per provider Monitor clinical progress, renal function, vancomycin levels as indicated F/U C&S, abx deescalation / LOT   Height: 5\' 5"  (165.1 cm) Weight: 41 kg (90 lb 6.2 oz) IBW/kg (Calculated) : 57  Temp (24hrs), Avg:97.7 F (36.5 C), Min:97.4 F (36.3 C), Max:98 F (36.7 C)  Recent Labs  Lab 05/14/23 1605 05/14/23 2039 05/15/23 0027 05/15/23 0610  WBC 5.3  --   --  4.7  CREATININE 0.38* 0.51  --  0.30*  LATICACIDVEN  --   --  1.6  --     Estimated Creatinine Clearance: 47.2 mL/min (A) (by C-G formula based on SCr of 0.3 mg/dL (L)).    Allergies  Allergen Reactions   Hydrocodone-Acetaminophen Nausea And Vomiting and Other (See Comments)    GI Intolerance and "allergic," per MAR   Donepezil Other (See Comments)    Urinary Incontinence and "allergic," per MAR    Antimicrobials this admission: 3/31 Zosyn x 1 4/1 Rocephin >>  4/1 vancomycin >>   Dose adjustments this admission: N/A  Microbiology results: 3/31 20:39 BCx (one bottle): NG < 12 hrs 3/31 17:56 BCx (two bottles):  GPR in aerobic bottle only; considered contaminant. GPC in anaerobic bottle only.  BCID detected staphylococcus species (no methicillin resistance detected.  Not S. Aureus, epidermidis or lugdunensis). 3/31 Resp panel: negative 4/1 MRSA PCR: ordered    Thank you  for allowing pharmacy to be a part of this patient's care.  Selinda Eon, PharmD, BCPS Clinical Pharmacist [place] 05/15/2023 4:21 PM

## 2023-05-15 NOTE — TOC Initial Note (Signed)
 Transition of Care [facility]) - Initial/Assessment Note    Patient Details  Name: Sherry Hicks MRN: [id number] Date of Birth: 08/05/1960  [facility] CM/SW Contact:    Otelia Santee, LCSW Phone Number: 05/15/2023, 1:38 PM  Clinical Narrative:                 Pt from [facility] SNF where she is a LTC resident. Pt bedbound at baseline w/ contractures of BLE. Pt essentially non-verbal at baseline. Spoke with pt's daughter and [facility] and  confirmed plan for pt to return to their facility at discharge. Pt will require PTAR for transportation at discharge. TOC will continue to follow.   Expected Discharge Plan: Long Term Nursing Home Barriers to Discharge: Continued Medical Work up   Patient Goals and CMS Choice Patient states their goals for this hospitalization and ongoing recovery are:: For pt to return to [facility].gov Compare Post Acute Care list provided to:: Patient Represenative (must comment) Choice offered to / list presented to : Adult Children [place] ownership interest in [facility].provided to:: Adult Children    Expected Discharge Plan and Services In-house Referral: Clinical Social Work Discharge Planning Services: NA Post Acute Care Choice: Resumption of Svcs/PTA Provider, Nursing Home Living arrangements for the past 2 months: Skilled Nursing Facility                 DME Arranged: N/A DME Agency: NA                  Prior Living Arrangements/Services Living arrangements for the past 2 months: Skilled Nursing Facility Lives with:: Facility Resident Patient language and need for interpreter reviewed:: Yes Do you feel safe going back to the place where you live?: Yes      Need for Family Participation in Patient Care: Yes (Comment) Care giver support system in place?: Yes (comment) Current home services: Other (comment) (NA) Criminal Activity/Legal Involvement Pertinent to Current  Situation/Hospitalization: No - Comment as needed  Activities of Daily Living   ADL Screening (condition at time of admission) Independently performs ADLs?: No Does the patient have a NEW difficulty with bathing/dressing/toileting/self-feeding that is expected to last >3 days?: No Does the patient have a NEW difficulty with getting in/out of bed, walking, or climbing stairs that is expected to last >3 days?: No Does the patient have a NEW difficulty with communication that is expected to last >3 days?: No Is the patient deaf or have difficulty hearing?: No Does the patient have difficulty seeing, even when wearing glasses/contacts?: No Does the patient have difficulty concentrating, remembering, or making decisions?: Yes  Permission Sought/Granted Permission sought to share information with : Facility Medical sales representative, Family Supports Permission granted to share information with : Yes, Verbal Permission Granted     Permission granted to share info w AGENCY: [facility]        Emotional Assessment Appearance:: Appears older than stated age Attitude/Demeanor/Rapport: Unable to Assess Affect (typically observed): Unable to Assess Orientation: : Oriented to Self Alcohol / Substance Use: Not Applicable Psych Involvement: No (comment)  Admission diagnosis:  Metabolic encephalopathy [G93.41] Hypernatremia [E87.0] Acute cystitis without hematuria [N30.00] Patient Active Problem List   Diagnosis Date Noted   Hypokalemia 05/14/2023   Urinary tract infection 05/14/2023   Acute metabolic encephalopathy 05/14/2023   Do not resuscitate 05/14/2023   Dementia due to another general medical condition, with behavioral disturbance (HCC)    Pressure ulcer of right hip,  stage 2 (HCC)    Alzheimer disease (HCC) 04/04/2020   Pressure injury of skin 04/02/2020   Protein-calorie malnutrition, moderate (HCC)    Transaminitis    Dementia with behavioral disturbance (HCC) 03/24/2020    History of mood disorder 08/29/2019   Apraxia 08/29/2019   Alteration in performance of activities of daily living 08/29/2019   Caregiver stress 08/29/2019   Early Onset Dementia with behavioral problem (HCC)    Hyperlipidemia 07/30/2019   Weight loss, unintentional 07/06/2019   Hypernatremia 07/06/2019   History of asthma, mild, intermittent 2017   Major depressive disorder, recurrent episode (HCC) 04/12/2006   HYPERTENSION, BENIGN SYSTEMIC 04/12/2006   PCP:  Sherron Monday, MD Pharmacy:   [id number] [place], [place] - [address] AT [facility] OF [facility] [address] [place] [address] Phone: [phone] Fax: [phone]  [facility] [place], [place] - [address] [place] [address] Phone: [phone] Fax: [phone]     Social Drivers of Health (SDOH) Social History: SDOH Screenings   Food Insecurity: Patient Unable To Answer (05/15/2023)  Housing: Patient Unable To Answer (05/15/2023)  Transportation Needs: Patient Unable To Answer (05/15/2023)  Utilities: Patient Unable To Answer (05/15/2023)  Depression (PHQ2-9): Low Risk  (07/08/2019)  Tobacco Use: High Risk (05/15/2023)   SDOH Interventions:     Readmission Risk Interventions     No data to display

## 2023-05-15 NOTE — Progress Notes (Signed)
 PHARMACY - PHYSICIAN COMMUNICATION CRITICAL VALUE ALERT - BLOOD CULTURE IDENTIFICATION (BCID)  Sherry Hicks is an 63 y.o. female who presented to [facility] on 05/14/2023 with a chief complaint of AMS  Assessment:  Urinalysis consistent with UTI  Blood cultures growing GP Rods in 1 out of 3, likely contaminant.  Name of physician (or Provider) Contacted: [place]  Current antibiotics: Rocephin  Changes to prescribed antibiotics recommended:  Patient is on recommended antibiotics - No changes needed  No results found for this or any previous visit.   Sherry Hicks, PharmD, BCPS Clinical Staff Pharmacist Misty [facility]illing[place] 05/15/2023  10:20 AM

## 2023-05-15 NOTE — Progress Notes (Addendum)
 Bedside swallow evaluation completed full report to follow.  Patient noted to be side-lying with contractures and had significant bruxism during evaluation.  She requires total assist to eat but nurse who is working as a Psychologist, sport and exercise today reports patient with good tolerance of intake without coughing but with some oral pocketing.    Premorbid orders from skilled nursing facility states patient is on a dysphagia 3 and thin diet.  Patient observed with cranberry juice, graham cracker, and [place] ice.  Delayed cough noted x 2 of approximately 8 liquid swallows especially noted with sequential amounts.  Presume this is due to premature spillage into pharynx or larynx prior to swallow reflex being triggered.  Note her chest x-ray was negative upon admission.    Recommend continue dysphagia 3 and thin diet utilizing precautions to mitigate dysphagia and help maximize intake.  Patient has been receiving her meds crushed with applesauce per nurse.  Did appear with minimal coating mid tongue region that did not clear question of could be the beginnings of oral candidiasis.  SLP was unable to photograph this adequately for the chart.  Rolena Infante, MS [facility] SLP Acute [employer] [phone]

## 2023-05-15 NOTE — Plan of Care (Signed)
   Problem: Nutrition: Goal: Adequate nutrition will be maintained Outcome: Progressing   Problem: Safety: Goal: Ability to remain free from injury will improve Outcome: Progressing   Problem: Skin Integrity: Goal: Risk for impaired skin integrity will decrease Outcome: Progressing

## 2023-05-15 NOTE — Evaluation (Signed)
 Clinical/Bedside Swallow Evaluation Patient Details  Name: Sherry Hicks MRN: [id number] Date of Birth: [birth year]  Today's Date: 05/15/2023 Time: SLP Start Time (ACUTE ONLY): 1710 SLP Stop Time (ACUTE ONLY): 1725 SLP Time Calculation (min) (ACUTE ONLY): 15 min  Past Medical History:  Past Medical History:  Diagnosis Date   Alteration in performance of activities of daily living 08/29/2019   Apraxia 08/29/2019   Arthritis    Dementia (HCC)    Dementia with behavioral problem (HCC)    History of asthma, mild, intermittent 02/14/2015   Hypertension    Smoker    Past Surgical History:  Past Surgical History:  Procedure Laterality Date   ANKLE SURGERY     SALPINGOOPHORECTOMY     TOTAL ABDOMINAL HYSTERECTOMY W/ BILATERAL SALPINGOOPHORECTOMY     for menorrhagia and pregnancy loss) and no longer requires GYN screening   HPI:  63 yo female with advanced dementia adm to [facility] with UTI, dehydration and hypernatremia. Pt with PMH + for schizoaffective d/o and prior smoker.  Swallow eval ordered. Pt was on a dys3/thin diet prior to admission.    Assessment / Plan / Recommendation  Clinical Impression  Patient noted to be side-lying with contractures and had significant bruxism during evaluation.  She requires total assist to eat but nurse who is working as a Psychologist, sport and exercise today reports patient with good tolerance of intake without coughing but with some oral pocketing.       Premorbid orders from skilled nursing facility states patient is on a dysphagia 3 and thin diet.  Patient observed with cranberry juice, graham cracker, and [place] ice.  Delayed cough noted x 2 of approximately 8 liquid swallows especially noted with sequential amounts.  Presume this is due to premature spillage into pharynx or larynx prior to swallow reflex being triggered.  Note her chest x-ray was negative upon admission.       Recommend continue dysphagia 3 and thin diet utilizing precautions to mitigate dysphagia and help  maximize intake.  Patient has been receiving her meds crushed with applesauce per nurse.     Did appear with minimal coating mid tongue region that did not clear question of could be the beginnings of oral candidiasis.  SLP was unable to photograph this adequately for the chart. SLP Visit Diagnosis: Dysphagia, oral phase (R13.11)    Aspiration Risk  Mild aspiration risk    Diet Recommendation Dysphagia 3 (Mech soft);Thin liquid    Liquid Administration via: Straw;Cup Medication Administration: Crushed with puree Supervision: Staff to assist with self feeding Compensations: Slow rate;Small sips/bites Postural Changes: Remain upright for at least 30 minutes after po intake;Seated upright at 90 degrees    Other  Recommendations Oral Care Recommendations: Oral care BID    Recommendations for follow up therapy are one component of a multi-disciplinary discharge planning process, led by the attending physician.  Recommendations may be updated based on patient status, additional functional criteria and insurance authorization.  Follow up Recommendations No SLP follow up      Assistance Recommended at Discharge    Functional Status Assessment Patient has had a recent decline in their functional status and demonstrates the ability to make significant improvements in function in a reasonable and predictable amount of time.  Frequency and Duration min 1 x/week  1 week       Prognosis Prognosis for improved oropharyngeal function: Fair Barriers to Reach Goals: Cognitive deficits      Swallow Study   General Date of Onset: 05/15/23 HPI:  63 yo female with advanced dementia adm to [facility] with UTI, dehydration and hypernatremia. Pt with PMH + for schizoaffective d/o and prior smoker.  Swallow eval ordered. Pt was on a dys3/thin diet prior to admission. Type of Study: Bedside Swallow Evaluation Diet Prior to this Study: Dysphagia 3 (mechanical soft);Thin liquids (Level 0) Temperature Spikes Noted:  No Respiratory Status: Room air History of Recent Intubation: No Behavior/Cognition: Alert Oral Cavity Assessment: Other (comment) (appears with minimal white coating mid tongue) Oral Care Completed by SLP: Yes Oral Cavity - Dentition: Adequate natural dentition Vision: Impaired for self-feeding Self-Feeding Abilities: Total assist Patient Positioning: Upright in bed;Other (comment) (upright sidelying) Baseline Vocal Quality: Not observed Volitional Cough: Cognitively unable to elicit Volitional Swallow: Unable to elicit    Oral/Motor/Sensory Function Overall Oral Motor/Sensory Function: Generalized oral weakness (pt able to seal lips on a straw, did not follow directions)   Ice Chips Ice chips: Not tested   Thin Liquid Presentation: Straw Other Comments: with large boluses, pt is reflexively coughing concerning for airway infiltration; small boluses tolerated without coughing    Nectar Thick Nectar Thick Liquid: Not tested   Honey Thick Honey Thick Liquid: Not tested   Puree Puree: Within functional limits Presentation: Spoon   Solid     Solid: Impaired Oral Phase Impairments: Impaired mastication;Reduced lingual movement/coordination Oral Phase Functional Implications: Impaired mastication;Oral holding Other Comments: italian ice helpful to clear      Sherry Hicks 05/15/2023,7:10 PM  Sherry Infante, MS [facility] SLP Acute Rehab Services Office [phone]

## 2023-05-15 NOTE — Care Management Obs Status (Signed)
 MEDICARE OBSERVATION STATUS NOTIFICATION   Patient Details  Name: Sherry Hicks MRN: [id number] Date of Birth: 1960-10-20   Medicare Observation Status Notification Given:  Yes    Otelia Santee, LCSW 05/15/2023, 1:36 PM

## 2023-05-15 NOTE — NC FL2 (Signed)
 [place] MEDICAID FL2 LEVEL OF CARE FORM     IDENTIFICATION  Patient Name: Sherry Hicks Birthdate: February 20, 1960 Sex: female Admission Date (Current Location): 05/14/2023  [facility] and [place] Number:  Producer, television/film/video and Address:  [facility],  [address] [place], [place] [address]      Provider Number: [id number]  Attending Physician Name and Address:  [place], Eric J, DO  Relative Name and Phone Number:  Enolia, Koepke (Daughter)  [phone]    Current Level of Care: Hospital Recommended Level of Care: Nursing Facility Prior Approval Number:    Date Approved/Denied:   PASRR Number:    Discharge Plan: SNF    Current Diagnoses: Patient Active Problem List   Diagnosis Date Noted   Hypokalemia 05/14/2023   Urinary tract infection 05/14/2023   Acute metabolic encephalopathy 05/14/2023   Do not resuscitate 05/14/2023   Dementia due to another general medical condition, with behavioral disturbance (HCC)    Pressure ulcer of right hip, stage 2 (HCC)    Alzheimer disease (HCC) 04/04/2020   Pressure injury of skin 04/02/2020   Protein-calorie malnutrition, moderate (HCC)    Transaminitis    Dementia with behavioral disturbance (HCC) 03/24/2020   History of mood disorder 08/29/2019   Apraxia 08/29/2019   Alteration in performance of activities of daily living 08/29/2019   Caregiver stress 08/29/2019   Early Onset Dementia with behavioral problem (HCC)    Hyperlipidemia 07/30/2019   Weight loss, unintentional 07/06/2019   Hypernatremia 07/06/2019   History of asthma, mild, intermittent 2017   Major depressive disorder, recurrent episode (HCC) 04/12/2006   HYPERTENSION, BENIGN SYSTEMIC 04/12/2006    Orientation RESPIRATION BLADDER Height & Weight     Self  Normal Incontinent Weight: 90 lb 6.2 oz (41 kg) Height:  5\' 5"  (165.1 cm)  BEHAVIORAL SYMPTOMS/MOOD NEUROLOGICAL BOWEL NUTRITION STATUS      Incontinent Diet (See DC summary)  AMBULATORY STATUS  COMMUNICATION OF NEEDS Skin   Total Care Non-Verbally PU Stage and Appropriate Care (Buttocks Stage 2)                       Personal Care Assistance Level of Assistance  Bathing, Feeding, Dressing Bathing Assistance: Maximum assistance Feeding assistance: Maximum assistance Dressing Assistance: Maximum assistance     Functional Limitations Info  Sight, Hearing, Speech Sight Info: Impaired Hearing Info: Adequate Speech Info: Impaired    SPECIAL CARE FACTORS FREQUENCY                       Contractures Contractures Info: Present    Additional Factors Info  Code Status, Allergies, Psychotropic Code Status Info: DNR Allergies Info: Hydrocodone-acetaminophen, Donepezil Psychotropic Info: See MAR         Current Medications (05/15/2023):  This is the current hospital active medication list Current Facility-Administered Medications  Medication Dose Route Frequency Provider Last Rate Last Admin   acetaminophen (TYLENOL) tablet 650 mg  650 mg Oral Q6H PRN Charlsie Quest, MD       Or   acetaminophen (TYLENOL) suppository 650 mg  650 mg Rectal Q6H PRN Charlsie Quest, MD       carbamazepine (TEGRETOL) tablet 400 mg  400 mg Oral TID Darreld Mclean R, MD   400 mg at 05/15/23 0827   cefTRIAXone (ROCEPHIN) 1 g in sodium chloride 0.9 % 100 mL IVPB  1 g Intravenous Q24H Darreld Mclean R, MD 200 mL/hr at 05/15/23 0116 1 g at  05/15/23 0116   dextrose 5 % solution   Intravenous Continuous [place], Eric J, DO 100 mL/hr at 05/15/23 1221 New Bag at 05/15/23 1221   gabapentin (NEURONTIN) capsule 400 mg  400 mg Oral BID Darreld Mclean R, MD   400 mg at 05/15/23 0827   heparin injection 5,000 Units  5,000 Units Subcutaneous Q8H Charlsie Quest, MD   5,000 Units at 05/15/23 0542   hydrOXYzine (ATARAX) tablet 12.5 mg  12.5 mg Oral TID Charlsie Quest, MD   12.5 mg at 05/15/23 0827   ondansetron (ZOFRAN) tablet 4 mg  4 mg Oral Q6H PRN Charlsie Quest, MD       Or   ondansetron (ZOFRAN)  injection 4 mg  4 mg Intravenous Q6H PRN Charlsie Quest, MD       senna-docusate (Senokot-S) tablet 1 tablet  1 tablet Oral QHS PRN Darreld Mclean R, MD       sodium chloride flush (NS) 0.9 % injection 3 mL  3 mL Intravenous Q12H Charlsie Quest, MD   3 mL at 05/15/23 1013     Discharge Medications: Please see discharge summary for a list of discharge medications.  Relevant Imaging Results:  Relevant Lab Results:   Additional Information SSN:  [id number]  Otelia Santee, LCSW

## 2023-05-15 NOTE — Plan of Care (Signed)
  Problem: Nutrition: Goal: Adequate nutrition will be maintained Outcome: Progressing   Problem: Coping: Goal: Level of anxiety will decrease Outcome: Progressing   Problem: Elimination: Goal: Will not experience complications related to bowel motility Outcome: Progressing   Problem: Pain Managment: Goal: General experience of comfort will improve and/or be controlled Outcome: Progressing   Problem: Safety: Goal: Ability to remain free from injury will improve Outcome: Progressing

## 2023-05-15 NOTE — Progress Notes (Addendum)
 PROGRESS NOTE    Sherry Hicks  WUJ:[id number] DOB: October 20, 1960 DOA: 05/14/2023 PCP: Sherron Monday, MD    Brief Narrative:   Sherry Hicks is a 63 y.o. female with past medical history significant for advanced Alzheimer's dementia with chronic bedbound and nonverbal status, contractures to bilateral lower extremities who presented to the ED from her facility for evaluation of change in mental status.  Patient is unable to provide any history due to advanced dementia and is otherwise supplemented by EDP, chart review, and daughter by phone. Patient brought to the ED via EMS from [facility] nursing facilities for diminished mentation and level of interaction.  She has advanced early onset Alzheimer's dementia.  She is chronically bedbound and largely nonverbal.  On arrival to the ED she was very somnolent and difficult to arouse.  She withdraw only to painful stimuli.   At time of admitting exam patient appears to be more alert.  Eyes are open and she tracks.  She does not follow commands.  She says "no" once but otherwise does not speak.   In the ED, BP 114/82, pulse 80, RR 12, temp 98.1 F rectally, SpO2 98% on room air. Sodium 154, potassium 3.1, bicarb 27, chloride 119, BUN 26, creatinine 0.38, serum glucose 95, LFTs within normal limits, ammonia 44, WBC 5.3, hemoglobin 13.1, platelets 204. ABG pH 7.44, pCO2 47, pO2 94.  SARS-CoV-2, influenza, RSV PCR negative. UA showed 100 protein, positive nitrates, small leukocytes, 0-5 RBCs, 21-50 WBCs, many bacteria.  Blood cultures in process. Portable chest x-ray negative for focal consolidation, edema, effusion. CT head without contrast negative for acute intracranial process.  Stable moderate chronic dilatation of the ventricular system noted. Patient was given IV Zosyn and 1 L LR.  The hospitalist service was consulted to admit.    Assessment & Plan:   Acute metabolic encephalopathy superimposed on advanced dementia: Improving Patient is  bedbound, nonverbal, cachectic at baseline.  Noted to have decreased level of interaction at her facility compared to her baseline.  Mentation seems to been improving towards baseline after receiving initial antibiotics and IV fluids. -- Start mechanical soft diet, SLP evaluation -- Continue IV fluids and antibiotics as below -- Continue Tegretol, gabapentin, Atarax -- Delirium, fall, aspiration precautions  Staph bacteremia Received message from pharmacy on 4/1 at 1600 regarding positive blood culture with Staphylococcus (1/4 aerobic bottle only).  Potential for contaminant.  Only potential source sacral ulcer. -- Start vancomycin, pharmacy for dosing/monitoring -- Check TTE -- Repeat blood cultures in the a.m.  Hypernatremia: Sodium 154 on admission, likely secondary to severe volume depletion complicated by urinary tract infection.   -- Na 154>148 -- Continue D5W at 75 mL/h -- Na q8h, repeat BMP in am   Urinary tract infection: Urinalysis consistent with UTI.   -- Urine Culture: penidng -- Ceftriaxone 1 g IV q24h  Positive blood culture 1 out of 4 blood cultures (anaerobic bottle) positive for GNR's, suspect contaminant -- Continue ceftriaxone as above -- Continue to follow blood cultures closely    Hypokalemia: Potassium 3.1 on admission, repleted.  Repeat potassium this morning 3.4, will continue IV repletion. --Repeat electrolytes in a.m. to include magnesium   Goals of care: Discussed with patient's daughter by phone on admission by admitting hospitalist.  CODE STATUS is confirmed DNR/DNI.  DVT prophylaxis: heparin injection 5,000 Units Start: 05/15/23 0600    Code Status: Limited: Do not attempt resuscitation (DNR) -DNR-LIMITED -Do Not Intubate/DNI  Family Communication: No family present at  bedside this morning  Disposition Plan:  Level of care: Med-Surg Status is: Observation The patient remains OBS appropriate and will d/c before 2 midnights.    Consultants:   None  Procedures:  None  Antimicrobials:  Ceftriaxone 3/31   Subjective: Patient seen examined bedside, lying in bed.  No family present.  Nonverbal, but opens eyes.  Unable to obtain any further ROS from patient.  Discussed with RN this morning.  Sodium improving, remains on IV antibiotics.  Objective: Vitals:   05/14/23 2200 05/15/23 0015 05/15/23 0444 05/15/23 0810  BP: 116/77 (!) 119/91 (!) 113/94 (!) 130/90  Pulse: 84 70 75 69  Resp: 11 12 12  (!) 8  Temp:  97.9 F (36.6 C) 97.7 F (36.5 C) (!) 97.5 F (36.4 C)  TempSrc:  Axillary Axillary Oral  SpO2: 95% 100% 100% 100%  Weight:      Height:        Intake/Output Summary (Last 24 hours) at 05/15/2023 1056 Last data filed at 05/15/2023 1013 Gross per 24 hour  Intake 976.13 ml  Output 700 ml  Net 276.13 ml   Filed Weights   05/14/23 1642  Weight: 41 kg    Examination:  Physical Exam: GEN: NAD, chronically ill appearance, thin/cachectic with contractures HEENT: NCAT, opens eyes, dry mucous membranes PULM: CTAB w/o wheezes/crackles, normal respiratory effort on room air CV: RRR w/o M/G/R GI: abd soft, nondistended, + BS MSK: no peripheral edema, extremities contracted with severe muscle wasting Integumentary: Sacral wound with Mepilex in place    Data Reviewed: I have personally reviewed following labs and imaging studies  CBC: Recent Labs  Lab 05/14/23 1605 05/15/23 0610  WBC 5.3 4.7  NEUTROABS 2.7  --   HGB 13.1 11.7*  HCT 43.7 37.3  MCV 106.6* 103.0*  PLT 204 174   Basic Metabolic Panel: Recent Labs  Lab 05/14/23 1605 05/14/23 2039 05/15/23 0610  NA 154* 155* 148*  K 3.1* 3.1* 3.4*  CL 118* 118* 116*  CO2 27 28 25   GLUCOSE 95 94 114*  BUN 26* 25* 19  CREATININE 0.38* 0.51 0.30*  CALCIUM 9.3 8.8* 8.7*   GFR: Estimated Creatinine Clearance: 47.2 mL/min (A) (by C-G formula based on SCr of 0.3 mg/dL (L)). Liver Function Tests: Recent Labs  Lab 05/14/23 1605  AST 20  ALT 24  ALKPHOS  76  BILITOT 0.3  PROT 6.8  ALBUMIN 4.0   No results for input(s): "LIPASE", "AMYLASE" in the last 168 hours. Recent Labs  Lab 05/14/23 1605  AMMONIA 44*   Coagulation Profile: No results for input(s): "INR", "PROTIME" in the last 168 hours. Cardiac Enzymes: No results for input(s): "CKTOTAL", "CKMB", "CKMBINDEX", "TROPONINI" in the last 168 hours. BNP (last 3 results) No results for input(s): "PROBNP" in the last 8760 hours. HbA1C: No results for input(s): "HGBA1C" in the last 72 hours. CBG: Recent Labs  Lab 05/14/23 1623  GLUCAP 96   Lipid Profile: No results for input(s): "CHOL", "HDL", "LDLCALC", "TRIG", "CHOLHDL", "LDLDIRECT" in the last 72 hours. Thyroid Function Tests: No results for input(s): "TSH", "T4TOTAL", "FREET4", "T3FREE", "THYROIDAB" in the last 72 hours. Anemia Panel: No results for input(s): "VITAMINB12", "FOLATE", "FERRITIN", "TIBC", "IRON", "RETICCTPCT" in the last 72 hours. Sepsis Labs: Recent Labs  Lab 05/15/23 0027  LATICACIDVEN 1.6    Recent Results (from the past 240 hours)  Resp panel by RT-PCR (RSV, Flu A&B, Covid) Anterior Nasal Swab     Status: None   Collection Time: 05/14/23  4:18 PM  Specimen: Anterior Nasal Swab  Result Value Ref Range Status   SARS Coronavirus 2 by RT PCR NEGATIVE NEGATIVE Final    Comment: (NOTE) SARS-CoV-2 target nucleic acids are NOT DETECTED.  The SARS-CoV-2 RNA is generally detectable in upper respiratory specimens during the acute phase of infection. The lowest concentration of SARS-CoV-2 viral copies this assay can detect is 138 copies/mL. A negative result does not preclude SARS-Cov-2 infection and should not be used as the sole basis for treatment or other patient management decisions. A negative result may occur with  improper specimen collection/handling, submission of specimen other than nasopharyngeal swab, presence of viral mutation(s) within the areas targeted by this assay, and inadequate number  of viral copies(<138 copies/mL). A negative result must be combined with clinical observations, patient history, and epidemiological information. The expected result is Negative.  Fact Sheet for Patients:  BloggerCourse.com  Fact Sheet for Healthcare Providers:  SeriousBroker.it  This test is no t yet approved or cleared by the [place] FDA and  has been authorized for detection and/or diagnosis of SARS-CoV-2 by FDA under an Emergency Use Authorization (EUA). This EUA will remain  in effect (meaning this test can be used) for the duration of the COVID-19 declaration under Section 564(b)(1) of the Act, 21 U.S.C.section 360bbb-3(b)(1), unless the authorization is terminated  or revoked sooner.       Influenza A by PCR NEGATIVE NEGATIVE Final   Influenza B by PCR NEGATIVE NEGATIVE Final    Comment: (NOTE) The Xpert Xpress SARS-CoV-2/FLU/RSV plus assay is intended as an aid in the diagnosis of influenza from Nasopharyngeal swab specimens and should not be used as a sole basis for treatment. Nasal washings and aspirates are unacceptable for Xpert Xpress SARS-CoV-2/FLU/RSV testing.  Fact Sheet for Patients: BloggerCourse.com  Fact Sheet for Healthcare Providers: SeriousBroker.it  This test is not yet approved or cleared by the [place] FDA and has been authorized for detection and/or diagnosis of SARS-CoV-2 by FDA under an Emergency Use Authorization (EUA). This EUA will remain in effect (meaning this test can be used) for the duration of the COVID-19 declaration under Section 564(b)(1) of the Act, 21 U.S.C. section 360bbb-3(b)(1), unless the authorization is terminated or revoked.     Resp Syncytial Virus by PCR NEGATIVE NEGATIVE Final    Comment: (NOTE) Fact Sheet for Patients: BloggerCourse.com  Fact Sheet for Healthcare  Providers: SeriousBroker.it  This test is not yet approved or cleared by the [place] FDA and has been authorized for detection and/or diagnosis of SARS-CoV-2 by FDA under an Emergency Use Authorization (EUA). This EUA will remain in effect (meaning this test can be used) for the duration of the COVID-19 declaration under Section 564(b)(1) of the Act, 21 U.S.C. section 360bbb-3(b)(1), unless the authorization is terminated or revoked.  Performed at [facility], [address], [place], [place] [address]   Blood culture (routine x 2)     Status: None (Preliminary result)   Collection Time: 05/14/23  5:56 PM   Specimen: BLOOD  Result Value Ref Range Status   Specimen Description   Final    BLOOD LEFT ANTECUBITAL Performed at [facility], [address], [place], [place] [address]    Special Requests   Final    BOTTLES DRAWN AEROBIC AND ANAEROBIC Blood Culture results may not be optimal due to an inadequate volume of blood received in culture bottles Performed at [facility], [address] [place], [place] [address]  Culture  Setup Time   Final    GRAM POSITIVE RODS AEROBIC BOTTLE ONLY CRITICAL RESULT CALLED TO, READ BACK BY AND VERIFIED WITH: Tamala Bari PHARMD, AT 9629 05/15/23 D. VANHOOK    Culture   Final    CULTURE REINCUBATED FOR BETTER GROWTH Performed at [facility], [address], [place], [place] [address]    Report Status PENDING  Incomplete  Blood culture (routine x 2)     Status: None (Preliminary result)   Collection Time: 05/14/23  8:39 PM   Specimen: BLOOD  Result Value Ref Range Status   Specimen Description   Final    BLOOD BLOOD LEFT FOREARM Performed at [facility], [address] [place], [place] [address]    Special Requests   Final    BOTTLES DRAWN AEROBIC ONLY Blood Culture results may not be optimal due to an inadequate volume of  blood received in culture bottles Performed at [facility], [address] [place], [place] [address]    Culture   Final    NO GROWTH < 12 HOURS Performed at [facility], [address] [place], [place] [address]    Report Status PENDING  Incomplete         Radiology Studies: CT Head Wo Contrast Result Date: 05/14/2023 CLINICAL DATA:  Altered mental status EXAM: CT HEAD WITHOUT CONTRAST TECHNIQUE: Contiguous axial images were obtained from the base of the skull through the vertex without intravenous contrast. RADIATION DOSE REDUCTION: This exam was performed according to the departmental dose-optimization program which includes automated exposure control, adjustment of the mA and/or kV according to patient size and/or use of iterative reconstruction technique. COMPARISON:  Head CT 10/27/2022 FINDINGS: Brain: No evidence of acute infarction, hemorrhage, extra-axial collection or mass lesion/mass effect. There is mild periventricular white matter hypodensity, cavum septum pellucidum and vergae again noted. There is stable moderate chronic dilatation of the ventricular system. Vascular: No hyperdense vessel or unexpected calcification. Skull: Normal. Negative for fracture or focal lesion. Sinuses/Orbits: No acute finding. Other: None. IMPRESSION: 1. No acute intracranial process. 2. Stable moderate chronic dilatation of the ventricular system. Electronically Signed   By: Darliss Cheney M.D.   On: 05/14/2023 17:54   DG Chest Portable 1 View Result Date: 05/14/2023 CLINICAL DATA:  Cough.  Altered mental status. EXAM: PORTABLE CHEST 1 VIEW COMPARISON:  Radiographs 10/27/2022 and 02/22/2022. FINDINGS: 1611 hours. Moderate rotation to the left. Allowing for this, the heart size and mediastinal contours are stable. The lungs appear clear. There is no evidence of pleural effusion or pneumothorax. The bones appear unchanged. Telemetry leads overlie the chest. IMPRESSION: No  evidence of acute cardiopulmonary process. Rotated exam. Electronically Signed   By: Carey Bullocks M.D.   On: 05/14/2023 16:43        Scheduled Meds:  carbamazepine  400 mg Oral TID   gabapentin  400 mg Oral BID   heparin  5,000 Units Subcutaneous Q8H   hydrOXYzine  12.5 mg Oral TID   sodium chloride flush  3 mL Intravenous Q12H   Continuous Infusions:  cefTRIAXone (ROCEPHIN)  IV 1 g (05/15/23 0116)   dextrose 100 mL/hr at 05/15/23 0827   potassium chloride 10 mEq (05/15/23 1013)     LOS: 0 days    Time spent: 52 minutes spent on 05/15/2023 caring for this patient face-to-face including chart review, ordering labs/tests, documenting, discussion with nursing staff, consultants, updating family and interview/physical exam    Minerva Areola  J [place], DO [facility] Available via Epic secure chat 7am-7pm After these hours, please refer to coverage provider listed on [email or web address] 05/15/2023, 10:56 AM

## 2023-05-16 ENCOUNTER — Inpatient Hospital Stay (HOSPITAL_COMMUNITY)

## 2023-05-16 ENCOUNTER — Inpatient Hospital Stay (HOSPITAL_COMMUNITY)
Admit: 2023-05-16 | Discharge: 2023-05-16 | Disposition: A | Discharge status: Home / Self Care | Attending: Internal Medicine | Admitting: Internal Medicine

## 2023-05-16 DIAGNOSIS — E870 Hyperosmolality and hypernatremia: Secondary | ICD-10-CM | POA: Diagnosis not present

## 2023-05-16 DIAGNOSIS — R7881 Bacteremia: Secondary | ICD-10-CM

## 2023-05-16 DIAGNOSIS — R4182 Altered mental status, unspecified: Secondary | ICD-10-CM

## 2023-05-16 DIAGNOSIS — R569 Unspecified convulsions: Secondary | ICD-10-CM

## 2023-05-16 LAB — GLUCOSE, CAPILLARY
Glucose-Capillary: 92 mg/dL (ref 70–99)
Glucose-Capillary: 100 mg/dL — ABNORMAL HIGH (ref 70–99)
Glucose-Capillary: 77 mg/dL (ref 70–99)
Glucose-Capillary: 74 mg/dL (ref 70–99)
Glucose-Capillary: 85 mg/dL (ref 70–99)

## 2023-05-16 LAB — AMMONIA: Ammonia: 40 umol/L — ABNORMAL HIGH (ref 9–35)

## 2023-05-16 LAB — BASIC METABOLIC PANEL WITH GFR
Anion gap: 7 (ref 5–15)
BUN: 13 mg/dL (ref 8–23)
CO2: 24 mmol/L (ref 22–32)
Calcium: 8.5 mg/dL — ABNORMAL LOW (ref 8.9–10.3)
Chloride: 111 mmol/L (ref 98–111)
Creatinine, Ser: 0.47 mg/dL (ref 0.44–1.00)
GFR, Estimated: >60 mL/min (ref >60)
Glucose, Bld: 100 mg/dL — ABNORMAL HIGH (ref 70–99)
Potassium: 3.4 mmol/L — ABNORMAL LOW (ref 3.5–5.1)
Sodium: 142 mmol/L (ref 135–145)

## 2023-05-16 LAB — ECHOCARDIOGRAM COMPLETE
AR max vel: 1.99 cm2
AV Peak grad: 2.1 mmHg
Ao pk vel: 0.72 m/s
Height: 65 in
S' Lateral: 3.2 cm
Weight: 1446.22 [oz_av]

## 2023-05-16 LAB — URINE CULTURE: Culture: >=100000 — AB

## 2023-05-16 LAB — MAGNESIUM: Magnesium: 2.2 mg/dL (ref 1.7–2.4)

## 2023-05-16 LAB — PHOSPHORUS: Phosphorus: 3.6 mg/dL (ref 2.5–4.6)

## 2023-05-16 MED ORDER — HYDROXYZINE HCL 25 MG PO TABS: 12.5000 mg | ORAL_TABLET | Freq: Three times a day (TID) | ORAL | Status: DC | PRN

## 2023-05-16 MED ORDER — POTASSIUM CHLORIDE 10 MEQ/100ML IV SOLN
10.0000 meq | INTRAVENOUS | Status: AC
  Administered 2023-05-16 (×3): 10 meq via INTRAVENOUS
  Filled 2023-05-16 (×3): qty 100

## 2023-05-16 MED ORDER — DEXTROSE 5 % IV SOLN: INTRAVENOUS | Status: AC

## 2023-05-16 MED ORDER — THIAMINE HCL 100 MG/ML IJ SOLN
500.0000 mg | Freq: Three times a day (TID) | INTRAVENOUS | Status: AC
  Administered 2023-05-17 – 2023-05-18 (×4): 500 mg via INTRAVENOUS
  Filled 2023-05-16 (×7): qty 5

## 2023-05-16 NOTE — Progress Notes (Signed)
 PROGRESS NOTE    Sherry Hicks  ZOX:[id number] DOB: April 29, 1960 DOA: 05/14/2023 PCP: Sherron Monday, MD   Brief Narrative: 63 year old with past medical history significant for advanced Alzheimer's dementia with chronic bedbound and nonverbal status, contractures to bilateral lower extremity presented to the ED from her facility for evaluation and change in mental status.  Patient is not able to provide history due to advanced dementia.  Per facility patient was noted to have this diminished mentation and level of interaction.  She is chronically bedbound and largely nonverbal.  On arrival to the ED she was very somnolent and difficult to arouse, withdrawing only to painful stimuli.  Subsequently became a little bit more alert and eyes were open.  Evaluation in the ED consistent with hyponatremia, UTI, acute metabolic encephalopathy   Assessment & Plan:   Principal Problem:   Hypernatremia Active Problems:   Urinary tract infection   Early Onset Dementia with behavioral problem (HCC)   Hypokalemia   Acute metabolic encephalopathy   Do not resuscitate   UTI (urinary tract infection)   1-Acute Metabolic Encephalopathy superimposed on advanced Dementia: -Patient bedbound, nonverbal, cachectic at baseline.  Noted to have decreased level of interaction at her facility compared to baseline. -Notes yesterday mentation seems to be improving today she is sleepy and keeps eyes closed. -She was able to take oral medication provided by her nurse -Will hold Atarax -Will continue with IV fluids -Encephalopathy in the setting of hypernatremia and UTI -Will proceed with MRI and EEG  2-1 out of 4 blood cultures positive for staph 2D echo: Ejection fraction 65%, mitral valve is normal in structure, aortic valve was not well visualized.  Tricuspid valve is normal in structure -Follow repeated blood cultures from 4/2 -Blood cultures from 3/31 staph species detected. -Follow final blood  cultures reports this might be a contaminant we will continue with vancomycin for now  3-Hypernatremia: in the doing of poor oral intake: Continue with IV fluids  4-Urinary tract infection: UA consistent with UTI.  Urine culture growing 100 K gram-negative rods -Continue with IV ceftriaxone  5-1 out of 4 blood cultures growing gram-negative rods suspect contaminant on IV ceftriaxone  Hypokalemia: Replace IV    See  wound care documentation below Pressure Injury 05/17/20 Left Stage 2 -  Partial thickness loss of dermis presenting as a shallow open injury with a red, pink wound bed without slough. (Active)  05/17/20 1236  Location:   Location Orientation: Left  Staging: Stage 2 -  Partial thickness loss of dermis presenting as a shallow open injury with a red, pink wound bed without slough.  Wound Description (Comments):   Present on Admission:      Pressure Injury 05/15/23 Buttocks Stage 2 -  Partial thickness loss of dermis presenting as a shallow open injury with a red, pink wound bed without slough. (Active)  05/15/23 0046  Location: Buttocks  Location Orientation:   Staging: Stage 2 -  Partial thickness loss of dermis presenting as a shallow open injury with a red, pink wound bed without slough.  Wound Description (Comments):   Present on Admission: Yes  Dressing Type Foam - Lift dressing to assess site every shift 05/15/23 1940     Estimated body mass index is 15.04 kg/m as calculated from the following:   Height as of this encounter: 5\' 5"  (1.651 m).   Weight as of this encounter: 41 kg.   DVT prophylaxis: Heparin Code Status: DNR Family Communication: Daughter.  Disposition Plan:  Status is: Inpatient Remains inpatient appropriate because: management of Delirium, UTI    Consultants:  None  Procedures:  MRI  Antimicrobials:    Subjective:   Objective: Vitals:   05/15/23 1239 05/15/23 1938 05/16/23 0500 05/16/23 1157  BP: (!) 145/93 113/78 92/71  117/76  Pulse: 67 77 70 74  Resp:  11 14 16   Temp: (!) 97.4 F (36.3 C) 97.7 F (36.5 C) 97.9 F (36.6 C) 97.6 F (36.4 C)  TempSrc: Oral Axillary Axillary   SpO2: 100% 100% 100% 100%  Weight:      Height:        Intake/Output Summary (Last 24 hours) at 05/16/2023 1257 Last data filed at 05/16/2023 1218 Gross per 24 hour  Intake 2550.87 ml  Output 1275 ml  Net 1275.87 ml   Filed Weights   05/14/23 1642  Weight: 41 kg    Examination:  General exam: Appears calm and comfortable  Respiratory system: Clear to auscultation. Respiratory effort normal. Cardiovascular system: S1 & S2 heard, RRR. Gastrointestinal system: Abdomen is nondistended, soft and nontender. Central nervous system: sleepy.  Extremities: Symmetric 5 x 5 power.    Data Reviewed: I have personally reviewed following labs and imaging studies  CBC: Recent Labs  Lab 05/14/23 1605 05/15/23 0610  WBC 5.3 4.7  NEUTROABS 2.7  --   HGB 13.1 11.7*  HCT 43.7 37.3  MCV 106.6* 103.0*  PLT 204 174   Basic Metabolic Panel: Recent Labs  Lab 05/14/23 1605 05/14/23 2039 05/15/23 0610 05/15/23 1547 05/15/23 2147 05/16/23 0554  NA 154* 155* 148* 140 141 142  K 3.1* 3.1* 3.4*  --   --  3.4*  CL 118* 118* 116*  --   --  111  CO2 27 28 25   --   --  24  GLUCOSE 95 94 114*  --   --  100*  BUN 26* 25* 19  --   --  13  CREATININE 0.38* 0.51 0.30*  --   --  0.47  CALCIUM 9.3 8.8* 8.7*  --   --  8.5*  MG  --   --   --   --   --  2.2  PHOS  --   --   --   --   --  3.6   GFR: Estimated Creatinine Clearance: 47.2 mL/min (by C-G formula based on SCr of 0.47 mg/dL). Liver Function Tests: Recent Labs  Lab 05/14/23 1605  AST 20  ALT 24  ALKPHOS 76  BILITOT 0.3  PROT 6.8  ALBUMIN 4.0   No results for input(s): "LIPASE", "AMYLASE" in the last 168 hours. Recent Labs  Lab 05/14/23 1605  AMMONIA 44*   Coagulation Profile: No results for input(s): "INR", "PROTIME" in the last 168 hours. Cardiac Enzymes: No  results for input(s): "CKTOTAL", "CKMB", "CKMBINDEX", "TROPONINI" in the last 168 hours. BNP (last 3 results) No results for input(s): "PROBNP" in the last 8760 hours. HbA1C: No results for input(s): "HGBA1C" in the last 72 hours. CBG: Recent Labs  Lab 05/14/23 1623 05/16/23 1022 05/16/23 1155  GLUCAP 96 92 100*   Lipid Profile: No results for input(s): "CHOL", "HDL", "LDLCALC", "TRIG", "CHOLHDL", "LDLDIRECT" in the last 72 hours. Thyroid Function Tests: No results for input(s): "TSH", "T4TOTAL", "FREET4", "T3FREE", "THYROIDAB" in the last 72 hours. Anemia Panel: No results for input(s): "VITAMINB12", "FOLATE", "FERRITIN", "TIBC", "IRON", "RETICCTPCT" in the last 72 hours. Sepsis Labs: Recent Labs  Lab 05/15/23 0027  LATICACIDVEN 1.6    Recent  Results (from the past 240 hours)  Resp panel by RT-PCR (RSV, Flu A&B, Covid) Anterior Nasal Swab     Status: None   Collection Time: 05/14/23  4:18 PM   Specimen: Anterior Nasal Swab  Result Value Ref Range Status   SARS Coronavirus 2 by RT PCR NEGATIVE NEGATIVE Final    Comment: (NOTE) SARS-CoV-2 target nucleic acids are NOT DETECTED.  The SARS-CoV-2 RNA is generally detectable in upper respiratory specimens during the acute phase of infection. The lowest concentration of SARS-CoV-2 viral copies this assay can detect is 138 copies/mL. A negative result does not preclude SARS-Cov-2 infection and should not be used as the sole basis for treatment or other patient management decisions. A negative result may occur with  improper specimen collection/handling, submission of specimen other than nasopharyngeal swab, presence of viral mutation(s) within the areas targeted by this assay, and inadequate number of viral copies(<138 copies/mL). A negative result must be combined with clinical observations, patient history, and epidemiological information. The expected result is Negative.  Fact Sheet for Patients:   BloggerCourse.com  Fact Sheet for Healthcare Providers:  SeriousBroker.it  This test is no t yet approved or cleared by the [place] FDA and  has been authorized for detection and/or diagnosis of SARS-CoV-2 by FDA under an Emergency Use Authorization (EUA). This EUA will remain  in effect (meaning this test can be used) for the duration of the COVID-19 declaration under Section 564(b)(1) of the Act, 21 U.S.C.section 360bbb-3(b)(1), unless the authorization is terminated  or revoked sooner.       Influenza A by PCR NEGATIVE NEGATIVE Final   Influenza B by PCR NEGATIVE NEGATIVE Final    Comment: (NOTE) The Xpert Xpress SARS-CoV-2/FLU/RSV plus assay is intended as an aid in the diagnosis of influenza from Nasopharyngeal swab specimens and should not be used as a sole basis for treatment. Nasal washings and aspirates are unacceptable for Xpert Xpress SARS-CoV-2/FLU/RSV testing.  Fact Sheet for Patients: BloggerCourse.com  Fact Sheet for Healthcare Providers: SeriousBroker.it  This test is not yet approved or cleared by the [place] FDA and has been authorized for detection and/or diagnosis of SARS-CoV-2 by FDA under an Emergency Use Authorization (EUA). This EUA will remain in effect (meaning this test can be used) for the duration of the COVID-19 declaration under Section 564(b)(1) of the Act, 21 U.S.C. section 360bbb-3(b)(1), unless the authorization is terminated or revoked.     Resp Syncytial Virus by PCR NEGATIVE NEGATIVE Final    Comment: (NOTE) Fact Sheet for Patients: BloggerCourse.com  Fact Sheet for Healthcare Providers: SeriousBroker.it  This test is not yet approved or cleared by the [place] FDA and has been authorized for detection and/or diagnosis of SARS-CoV-2 by FDA under an Emergency Use  Authorization (EUA). This EUA will remain in effect (meaning this test can be used) for the duration of the COVID-19 declaration under Section 564(b)(1) of the Act, 21 U.S.C. section 360bbb-3(b)(1), unless the authorization is terminated or revoked.  Performed at [facility], [address], [place], [place] [address]   Blood culture (routine x 2)     Status: None (Preliminary result)   Collection Time: 05/14/23  5:56 PM   Specimen: BLOOD  Result Value Ref Range Status   Specimen Description   Final    BLOOD LEFT ANTECUBITAL Performed at [facility], [address] [place], [place] [address]    Special Requests   Final    BOTTLES DRAWN AEROBIC AND  ANAEROBIC Blood Culture results may not be optimal due to an inadequate volume of blood received in culture bottles Performed at [facility], [address] [place], [place] [address]    Culture  Setup Time   Final    GRAM POSITIVE RODS AEROBIC BOTTLE ONLY CRITICAL RESULT CALLED TO, READ BACK BY AND VERIFIED WITH: Tamala Bari PHARMD, AT 4403 05/15/23 D. VANHOOK GRAM POSITIVE COCCI ANAEROBIC BOTTLE ONLY CRITICAL RESULT CALLED TO, READ BACK BY AND VERIFIED WITH: PHARMD MELISSA JAMES ON 05/15/23 @ 1544 BY DRT    Culture   Final    CULTURE REINCUBATED FOR BETTER GROWTH Performed at [facility], [address], [place], [place] [address]    Report Status PENDING  Incomplete  Blood Culture ID Panel (Reflexed)     Status: Abnormal   Collection Time: 05/14/23  5:56 PM  Result Value Ref Range Status   Enterococcus faecalis NOT DETECTED NOT DETECTED Final   Enterococcus Faecium NOT DETECTED NOT DETECTED Final   Listeria monocytogenes NOT DETECTED NOT DETECTED Final   Staphylococcus species DETECTED (A) NOT DETECTED Final    Comment: CRITICAL RESULT CALLED TO, READ BACK BY AND VERIFIED WITH: PHARMD MELISSA JAMES ON 05/15/23 @ 1544 BY DRT    Staphylococcus aureus (BCID) NOT  DETECTED NOT DETECTED Final   Staphylococcus epidermidis NOT DETECTED NOT DETECTED Final   Staphylococcus lugdunensis NOT DETECTED NOT DETECTED Final   Streptococcus species NOT DETECTED NOT DETECTED Final   Streptococcus agalactiae NOT DETECTED NOT DETECTED Final   Streptococcus pneumoniae NOT DETECTED NOT DETECTED Final   Streptococcus pyogenes NOT DETECTED NOT DETECTED Final   A.calcoaceticus-baumannii NOT DETECTED NOT DETECTED Final   Bacteroides fragilis NOT DETECTED NOT DETECTED Final   Enterobacterales NOT DETECTED NOT DETECTED Final   Enterobacter cloacae complex NOT DETECTED NOT DETECTED Final   Escherichia coli NOT DETECTED NOT DETECTED Final   Klebsiella aerogenes NOT DETECTED NOT DETECTED Final   Klebsiella oxytoca NOT DETECTED NOT DETECTED Final   Klebsiella pneumoniae NOT DETECTED NOT DETECTED Final   Proteus species NOT DETECTED NOT DETECTED Final   Salmonella species NOT DETECTED NOT DETECTED Final   Serratia marcescens NOT DETECTED NOT DETECTED Final   Haemophilus influenzae NOT DETECTED NOT DETECTED Final   Neisseria meningitidis NOT DETECTED NOT DETECTED Final   Pseudomonas aeruginosa NOT DETECTED NOT DETECTED Final   Stenotrophomonas maltophilia NOT DETECTED NOT DETECTED Final   Candida albicans NOT DETECTED NOT DETECTED Final   Candida auris NOT DETECTED NOT DETECTED Final   Candida glabrata NOT DETECTED NOT DETECTED Final   Candida krusei NOT DETECTED NOT DETECTED Final   Candida parapsilosis NOT DETECTED NOT DETECTED Final   Candida tropicalis NOT DETECTED NOT DETECTED Final   Cryptococcus neoformans/gattii NOT DETECTED NOT DETECTED Final    Comment: Performed at [facility], [address] [place], [place] [address]  Blood culture (routine x 2)     Status: None (Preliminary result)   Collection Time: 05/14/23  8:39 PM   Specimen: BLOOD  Result Value Ref Range Status   Specimen Description   Final    BLOOD BLOOD LEFT FOREARM Performed at [facility], [address] [place], [place] [address]    Special Requests   Final    BOTTLES DRAWN AEROBIC ONLY Blood Culture results may not be optimal due to an inadequate volume of blood received in culture bottles Performed at [facility], [address] [place], [place]  [address]    Culture   Final    NO GROWTH 2 DAYS Performed at [facility], [address], [place], [place] [address]    Report Status PENDING  Incomplete  Urine Culture (for pregnant, neutropenic or urologic patients or patients with an indwelling urinary catheter)     Status: Abnormal (Preliminary result)   Collection Time: 05/14/23  8:50 PM   Specimen: Urine, Clean Catch  Result Value Ref Range Status   Specimen Description   Final    URINE, CLEAN CATCH Performed at [facility], [address], [place], [place] [address]    Special Requests   Final    NONE Performed at [facility], [address] [place], [place] [address]    Culture (A)  Final    >=100,000 COLONIES/mL GRAM NEGATIVE RODS IDENTIFICATION AND SUSCEPTIBILITIES TO FOLLOW Performed at [facility], [address] [place], [place] [address]    Report Status PENDING  Incomplete  MRSA Next Gen by PCR, Nasal     Status: None   Collection Time: 05/15/23  5:26 PM   Specimen: Nasal Mucosa; Nasal Swab  Result Value Ref Range Status   MRSA by PCR Next Gen NOT DETECTED NOT DETECTED Final    Comment: (NOTE) The GeneXpert MRSA Assay (FDA approved for NASAL specimens only), is one component of a comprehensive MRSA colonization surveillance program. It is not intended to diagnose MRSA infection nor to guide or monitor treatment for MRSA infections. Test performance is not FDA approved in patients less than 41 years old. Performed at [facility], [address] [place], [place] [address]          Radiology Studies: ECHOCARDIOGRAM  COMPLETE Result Date: 05/16/2023    ECHOCARDIOGRAM REPORT   Patient Name:   [name] Date of Exam: 05/16/2023 Medical Rec #:  [id number]        Height:       65.0 in Accession #:    [id number]       Weight:       90.4 lb Date of Birth:  1960/06/21        BSA:          1.411 m Patient Age:    63 years         BP:           92/71 mmHg Patient Gender: F                HR:           68 bpm. Exam Location:  Inpatient Procedure: 2D Echo, Cardiac Doppler and Color Doppler (Both Spectral and Color            Flow Doppler were utilized during procedure). Indications:    Bacteremia  History:        Patient has no prior history of Echocardiogram examinations.                 Risk Factors:Hypertension.  Sonographer:    Amy Chionchio Referring Phys: [id number] ERIC J [place]  Sonographer Comments: Immobility. IMPRESSIONS  1. Technically difficult study. Left ventricular ejection fraction, by estimation, is 60 to 65%. The left ventricle has normal function. The left ventricle has no regional wall motion abnormalities. There is mild left ventricular hypertrophy. Left ventricular diastolic parameters are indeterminate.  2. Right ventricular systolic function is normal. The right ventricular size is normal.  3. The mitral valve is normal  in structure. No evidence of mitral valve regurgitation.  4. The aortic valve was not well visualized. Aortic valve regurgitation is not visualized. No aortic stenosis is present. FINDINGS  Left Ventricle: Left ventricular ejection fraction, by estimation, is 60 to 65%. The left ventricle has normal function. The left ventricle has no regional wall motion abnormalities. The left ventricular internal cavity size was normal in size. There is  mild left ventricular hypertrophy. Left ventricular diastolic parameters are indeterminate. Right Ventricle: The right ventricular size is normal. No increase in right ventricular wall thickness. Right ventricular systolic function is normal. Left Atrium: Left  atrial size was normal in size. Right Atrium: Right atrial size was normal in size. Pericardium: Trivial pericardial effusion is present. Mitral Valve: The mitral valve is normal in structure. No evidence of mitral valve regurgitation. Tricuspid Valve: The tricuspid valve is normal in structure. Tricuspid valve regurgitation is trivial. Aortic Valve: The aortic valve was not well visualized. Aortic valve regurgitation is not visualized. No aortic stenosis is present. Aortic valve peak gradient measures 2.1 mmHg. Pulmonic Valve: The pulmonic valve was not well visualized. Pulmonic valve regurgitation is not visualized. Aorta: The aortic root is normal in size and structure. IAS/Shunts: The interatrial septum was not well visualized.  LEFT VENTRICLE PLAX 2D LVIDd:         4.20 cm LVIDs:         3.20 cm LV PW:         1.10 cm LV IVS:        0.80 cm LVOT diam:     1.90 cm LVOT Area:     2.84 cm  RIGHT VENTRICLE TAPSE (M-mode): 1.0 cm AORTIC VALVE                PULMONIC VALVE AV Area (Vmax): 1.99 cm    PV Vmax:       0.57 m/s AV Vmax:        72.40 cm/s  PV Peak grad:  1.3 mmHg AV Peak Grad:   2.1 mmHg LVOT Vmax:      50.90 cm/s TRICUSPID VALVE TR Peak grad:   18.0 mmHg TR Vmax:        212.00 cm/s  SHUNTS Systemic Diam: 1.90 cm Epifanio Lesches MD Electronically signed by Epifanio Lesches MD Signature Date/Time: 05/16/2023/11:45:59 AM    Final    CT Head Wo Contrast Result Date: 05/14/2023 CLINICAL DATA:  Altered mental status EXAM: CT HEAD WITHOUT CONTRAST TECHNIQUE: Contiguous axial images were obtained from the base of the skull through the vertex without intravenous contrast. RADIATION DOSE REDUCTION: This exam was performed according to the departmental dose-optimization program which includes automated exposure control, adjustment of the mA and/or kV according to patient size and/or use of iterative reconstruction technique. COMPARISON:  Head CT 10/27/2022 FINDINGS: Brain: No evidence of acute  infarction, hemorrhage, extra-axial collection or mass lesion/mass effect. There is mild periventricular white matter hypodensity, cavum septum pellucidum and vergae again noted. There is stable moderate chronic dilatation of the ventricular system. Vascular: No hyperdense vessel or unexpected calcification. Skull: Normal. Negative for fracture or focal lesion. Sinuses/Orbits: No acute finding. Other: None. IMPRESSION: 1. No acute intracranial process. 2. Stable moderate chronic dilatation of the ventricular system. Electronically Signed   By: Darliss Cheney M.D.   On: 05/14/2023 17:54   DG Chest Portable 1 View Result Date: 05/14/2023 CLINICAL DATA:  Cough.  Altered mental status. EXAM: PORTABLE CHEST 1 VIEW COMPARISON:  Radiographs 10/27/2022 and 02/22/2022. FINDINGS: 1611  hours. Moderate rotation to the left. Allowing for this, the heart size and mediastinal contours are stable. The lungs appear clear. There is no evidence of pleural effusion or pneumothorax. The bones appear unchanged. Telemetry leads overlie the chest. IMPRESSION: No evidence of acute cardiopulmonary process. Rotated exam. Electronically Signed   By: Carey Bullocks M.D.   On: 05/14/2023 16:43        Scheduled Meds:  carbamazepine  400 mg Oral TID   gabapentin  400 mg Oral BID   heparin  5,000 Units Subcutaneous Q8H   sodium chloride flush  3 mL Intravenous Q12H   Continuous Infusions:  cefTRIAXone (ROCEPHIN)  IV 1 g (05/15/23 2311)   dextrose 100 mL/hr at 05/16/23 0959   potassium chloride 10 mEq (05/16/23 1117)   vancomycin Stopped (05/15/23 1800)     LOS: 1 day    Time spent: 35 minutes    Vear Staton A Francee Setzer, MD [facility]   If 7PM-7AM, please contact night-coverage [email or web address]  05/16/2023, 12:57 PM

## 2023-05-16 NOTE — Plan of Care (Signed)
  Problem: Education: Goal: Knowledge of General Education information will improve Description: Including pain rating scale, medication(s)/side effects and non-pharmacologic comfort measures Outcome: Progressing   Problem: Clinical Measurements: Goal: Will remain free from infection Outcome: Progressing Goal: Diagnostic test results will improve Outcome: Progressing   Problem: Nutrition: Goal: Adequate nutrition will be maintained Outcome: Progressing   Problem: Elimination: Goal: Will not experience complications related to urinary retention Outcome: Progressing

## 2023-05-16 NOTE — Progress Notes (Signed)
 EEG complete - results pending

## 2023-05-16 NOTE — Plan of Care (Signed)

## 2023-05-16 NOTE — Procedures (Signed)
 Patient Name: Sherry Hicks  MRN: [id number]  Epilepsy Attending: Charlsie Quest  Referring Physician/Provider: Alba Cory, MD  Date: 05/16/2023 Duration: 23.13 mins  Patient history: 63yo F with ams. EEG to evaluate for seizure  Level of alertness: comatose/ lethargic   AEDs during EEG study: GBP, CBZ  Technical aspects: This EEG study was done with scalp electrodes positioned according to the 10-20 International system of electrode placement. Electrical activity was reviewed with band pass filter of 1-70Hz , sensitivity of 7 uV/mm, display speed of 109mm/sec with a 60Hz  notched filter applied as appropriate. EEG data were recorded continuously and digitally stored.  Video monitoring was available and reviewed as appropriate.  Description: EEG showed continuous generalized low amplitude  3 to 6 Hz theta-delta slowing. Sharp transients were noted in right occipital region. Hyperventilation and photic stimulation were not performed.     ABNORMALITY - Continuous slow, generalized  IMPRESSION: This study is suggestive of severe diffuse encephalopathy. No seizures or definite epileptiform discharges were seen throughout the recording.  [name]

## 2023-05-17 DIAGNOSIS — E870 Hyperosmolality and hypernatremia: Secondary | ICD-10-CM | POA: Diagnosis not present

## 2023-05-17 LAB — CBC
HCT: 36 % (ref 36.0–46.0)
Hemoglobin: 11.3 g/dL — ABNORMAL LOW (ref 12.0–15.0)
MCH: 31.6 pg (ref 26.0–34.0)
MCHC: 31.4 g/dL (ref 30.0–36.0)
MCV: 100.6 fL — ABNORMAL HIGH (ref 80.0–100.0)
Platelets: 168 10*3/uL (ref 150–400)
RBC: 3.58 MIL/uL — ABNORMAL LOW (ref 3.87–5.11)
RDW: 12.6 % (ref 11.5–15.5)
WBC: 3.7 10*3/uL — ABNORMAL LOW (ref 4.0–10.5)
nRBC: 0 % (ref 0.0–0.2)

## 2023-05-17 LAB — GLUCOSE, CAPILLARY
Glucose-Capillary: 114 mg/dL — ABNORMAL HIGH (ref 70–99)
Glucose-Capillary: 96 mg/dL (ref 70–99)
Glucose-Capillary: 77 mg/dL (ref 70–99)
Glucose-Capillary: 77 mg/dL (ref 70–99)

## 2023-05-17 LAB — BASIC METABOLIC PANEL WITH GFR
Anion gap: 8 (ref 5–15)
BUN: 6 mg/dL — ABNORMAL LOW (ref 8–23)
CO2: 25 mmol/L (ref 22–32)
Calcium: 8.4 mg/dL — ABNORMAL LOW (ref 8.9–10.3)
Chloride: 104 mmol/L (ref 98–111)
Creatinine, Ser: 0.39 mg/dL — ABNORMAL LOW (ref 0.44–1.00)
GFR, Estimated: >60 mL/min (ref >60)
Glucose, Bld: 108 mg/dL — ABNORMAL HIGH (ref 70–99)
Potassium: 3.4 mmol/L — ABNORMAL LOW (ref 3.5–5.1)
Sodium: 137 mmol/L (ref 135–145)

## 2023-05-17 LAB — CULTURE, BLOOD (ROUTINE X 2)

## 2023-05-17 MED ORDER — CARBAMAZEPINE 200 MG PO TABS
200.0000 mg | ORAL_TABLET | Freq: Every day | ORAL | Status: DC
  Administered 2023-05-17: 200 mg via ORAL
  Filled 2023-05-17 (×2): qty 1

## 2023-05-17 MED ORDER — POTASSIUM CHLORIDE 10 MEQ/100ML IV SOLN
10.0000 meq | INTRAVENOUS | Status: AC
  Administered 2023-05-17 (×3): 10 meq via INTRAVENOUS
  Filled 2023-05-17 (×3): qty 100

## 2023-05-17 MED ORDER — GABAPENTIN 300 MG PO CAPS
300.0000 mg | ORAL_CAPSULE | Freq: Every day | ORAL | Status: DC
  Administered 2023-05-17: 300 mg via ORAL
  Filled 2023-05-17: qty 1

## 2023-05-17 MED ORDER — DEXTROSE 5 % IV SOLN: INTRAVENOUS | Status: AC

## 2023-05-17 NOTE — Plan of Care (Signed)
  Problem: Clinical Measurements: Goal: Will remain free from infection Outcome: Progressing Goal: Diagnostic test results will improve Outcome: Progressing   Problem: Nutrition: Goal: Adequate nutrition will be maintained Outcome: Progressing   Problem: Elimination: Goal: Will not experience complications related to bowel motility Outcome: Progressing Goal: Will not experience complications related to urinary retention Outcome: Progressing   Problem: Safety: Goal: Ability to remain free from injury will improve Outcome: Progressing   Problem: Skin Integrity: Goal: Risk for impaired skin integrity will decrease Outcome: Progressing

## 2023-05-17 NOTE — Progress Notes (Signed)
 PROGRESS NOTE    Sherry Hicks  ZOX:[id number] DOB: [birth year] DOA: 05/14/2023 PCP: Sherron Monday, MD   Brief Narrative: 63 year old with past medical history significant for advanced Alzheimer's dementia with chronic bedbound and nonverbal status, contractures to bilateral lower extremity presented to the ED from her facility for evaluation and change in mental status.  Patient is not able to provide history due to advanced dementia.  Per facility patient was noted to have this diminished mentation and level of interaction.  She is chronically bedbound and largely nonverbal.  On arrival to the ED she was very somnolent and difficult to arouse, withdrawing only to painful stimuli.  Subsequently became a little bit more alert and eyes were open.  Evaluation in the ED consistent with hyponatremia, UTI, acute metabolic encephalopathy   Assessment & Plan:   Principal Problem:   Hypernatremia Active Problems:   Urinary tract infection   Early Onset Dementia with behavioral problem (HCC)   Hypokalemia   Acute metabolic encephalopathy   Do not resuscitate   UTI (urinary tract infection)   1-Acute Metabolic Encephalopathy superimposed on advanced Dementia: -Patient bedbound, nonverbal, cachectic at baseline.  Noted to have decreased level of interaction at her facility compared to baseline. -Atarax change o PRN -Continue with IV fluids -Encephalopathy in the setting of hypernatremia and UTI -MRI negative fro stroke and EEG showed encephalopathy/  Likely Delirium, she is more awake today.  Tegretol change to HS, and gabapentin as well. She was on tegretol for mood disorder.   2-1 out of 4 blood cultures positive for staph 2D echo: Ejection fraction 65%, mitral valve is normal in structure, aortic valve was not well visualized.  Tricuspid valve is normal in structure -Follow repeated blood cultures from 4/2 -Blood cultures from 3/31 staph species detected. -likely contaminate,  staph hominis. Discontinue Vancomycin.   3-Hypernatremia: in the doing of poor oral intake: Continue with IV fluids  4-Urinary tract infection: UA consistent with UTI.  Urine culture growing 100 K E coli -Continue with IV ceftriaxone 5 days.   5-1 out of 4 blood cultures growing gram-negative rods suspect contaminant on IV ceftriaxone  Hypokalemia: Replaced    See  wound care documentation below Pressure Injury 05/17/20 Left Stage 2 -  Partial thickness loss of dermis presenting as a shallow open injury with a red, pink wound bed without slough. (Active)  05/17/20 1236  Location:   Location Orientation: Left  Staging: Stage 2 -  Partial thickness loss of dermis presenting as a shallow open injury with a red, pink wound bed without slough.  Wound Description (Comments):   Present on Admission:      Pressure Injury 05/15/23 Buttocks Stage 2 -  Partial thickness loss of dermis presenting as a shallow open injury with a red, pink wound bed without slough. (Active)  05/15/23 0046  Location: Buttocks  Location Orientation:   Staging: Stage 2 -  Partial thickness loss of dermis presenting as a shallow open injury with a red, pink wound bed without slough.  Wound Description (Comments):   Present on Admission: Yes  Dressing Type Foam - Lift dressing to assess site every shift 05/16/23 2104     Estimated body mass index is 15.04 kg/m as calculated from the following:   Height as of this encounter: 5\' 5"  (1.651 m).   Weight as of this encounter: 41 kg.   DVT prophylaxis: Heparin Code Status: DNR Family Communication: Daughter.  Disposition Plan:  Status is: Inpatient Remains inpatient appropriate  because: management of Delirium, UTI    Consultants:  None  Procedures:  MRI  Antimicrobials:    Subjective: She is more awake today, open eyes. Nurse was able to feed her today   Objective: Vitals:   05/16/23 0500 05/16/23 1157 05/16/23 1932 05/17/23 0438  BP: 92/71  117/76 112/78 139/88  Pulse: 70 74 65 66  Resp: 14 16 13 13   Temp: 97.9 F (36.6 C) 97.6 F (36.4 C) (!) 97.5 F (36.4 C) 97.6 F (36.4 C)  TempSrc: Axillary  Axillary Axillary  SpO2: 100% 100% 100% 100%  Weight:      Height:        Intake/Output Summary (Last 24 hours) at 05/17/2023 0745 Last data filed at 05/17/2023 0650 Gross per 24 hour  Intake 176.71 ml  Output 1400 ml  Net -1223.29 ml   Filed Weights   05/14/23 1642  Weight: 41 kg    Examination:  General exam: NAD  Respiratory system: CTA Cardiovascular system: S1, S 2 RRR Gastrointestinal system: BS present, soft nt Central nervous system: eyes open today  Extremities: no edema, contracted     Data Reviewed: I have personally reviewed following labs and imaging studies  CBC: Recent Labs  Lab 05/14/23 1605 05/15/23 0610 05/17/23 0541  WBC 5.3 4.7 3.7*  NEUTROABS 2.7  --   --   HGB 13.1 11.7* 11.3*  HCT 43.7 37.3 36.0  MCV 106.6* 103.0* 100.6*  PLT 204 174 168   Basic Metabolic Panel: Recent Labs  Lab 05/14/23 1605 05/14/23 2039 05/15/23 0610 05/15/23 1547 05/15/23 2147 05/16/23 0554 05/17/23 0541  NA 154* 155* 148* 140 141 142 137  K 3.1* 3.1* 3.4*  --   --  3.4* 3.4*  CL 118* 118* 116*  --   --  111 104  CO2 27 28 25   --   --  24 25  GLUCOSE 95 94 114*  --   --  100* 108*  BUN 26* 25* 19  --   --  13 6*  CREATININE 0.38* 0.51 0.30*  --   --  0.47 0.39*  CALCIUM 9.3 8.8* 8.7*  --   --  8.5* 8.4*  MG  --   --   --   --   --  2.2  --   PHOS  --   --   --   --   --  3.6  --    GFR: Estimated Creatinine Clearance: 47.2 mL/min (A) (by C-G formula based on SCr of 0.39 mg/dL (L)). Liver Function Tests: Recent Labs  Lab 05/14/23 1605  AST 20  ALT 24  ALKPHOS 76  BILITOT 0.3  PROT 6.8  ALBUMIN 4.0   No results for input(s): "LIPASE", "AMYLASE" in the last 168 hours. Recent Labs  Lab 05/14/23 1605 05/16/23 1846  AMMONIA 44* 40*   Coagulation Profile: No results for input(s):  "INR", "PROTIME" in the last 168 hours. Cardiac Enzymes: No results for input(s): "CKTOTAL", "CKMB", "CKMBINDEX", "TROPONINI" in the last 168 hours. BNP (last 3 results) No results for input(s): "PROBNP" in the last 8760 hours. HbA1C: No results for input(s): "HGBA1C" in the last 72 hours. CBG: Recent Labs  Lab 05/16/23 1155 05/16/23 1650 05/16/23 2130 05/16/23 2319 05/17/23 0433  GLUCAP 100* 77 74 85 114*   Lipid Profile: No results for input(s): "CHOL", "HDL", "LDLCALC", "TRIG", "CHOLHDL", "LDLDIRECT" in the last 72 hours. Thyroid Function Tests: No results for input(s): "TSH", "T4TOTAL", "FREET4", "T3FREE", "THYROIDAB" in the last  72 hours. Anemia Panel: No results for input(s): "VITAMINB12", "FOLATE", "FERRITIN", "TIBC", "IRON", "RETICCTPCT" in the last 72 hours. Sepsis Labs: Recent Labs  Lab 05/15/23 0027  LATICACIDVEN 1.6    Recent Results (from the past 240 hours)  Resp panel by RT-PCR (RSV, Flu A&B, Covid) Anterior Nasal Swab     Status: None   Collection Time: 05/14/23  4:18 PM   Specimen: Anterior Nasal Swab  Result Value Ref Range Status   SARS Coronavirus 2 by RT PCR NEGATIVE NEGATIVE Final    Comment: (NOTE) SARS-CoV-2 target nucleic acids are NOT DETECTED.  The SARS-CoV-2 RNA is generally detectable in upper respiratory specimens during the acute phase of infection. The lowest concentration of SARS-CoV-2 viral copies this assay can detect is 138 copies/mL. A negative result does not preclude SARS-Cov-2 infection and should not be used as the sole basis for treatment or other patient management decisions. A negative result may occur with  improper specimen collection/handling, submission of specimen other than nasopharyngeal swab, presence of viral mutation(s) within the areas targeted by this assay, and inadequate number of viral copies(<138 copies/mL). A negative result must be combined with clinical observations, patient history, and  epidemiological information. The expected result is Negative.  Fact Sheet for Patients:  BloggerCourse.com  Fact Sheet for Healthcare Providers:  SeriousBroker.it  This test is no t yet approved or cleared by the [place] FDA and  has been authorized for detection and/or diagnosis of SARS-CoV-2 by FDA under an Emergency Use Authorization (EUA). This EUA will remain  in effect (meaning this test can be used) for the duration of the COVID-19 declaration under Section 564(b)(1) of the Act, 21 U.S.C.section 360bbb-3(b)(1), unless the authorization is terminated  or revoked sooner.       Influenza A by PCR NEGATIVE NEGATIVE Final   Influenza B by PCR NEGATIVE NEGATIVE Final    Comment: (NOTE) The Xpert Xpress SARS-CoV-2/FLU/RSV plus assay is intended as an aid in the diagnosis of influenza from Nasopharyngeal swab specimens and should not be used as a sole basis for treatment. Nasal washings and aspirates are unacceptable for Xpert Xpress SARS-CoV-2/FLU/RSV testing.  Fact Sheet for Patients: BloggerCourse.com  Fact Sheet for Healthcare Providers: SeriousBroker.it  This test is not yet approved or cleared by the [place] FDA and has been authorized for detection and/or diagnosis of SARS-CoV-2 by FDA under an Emergency Use Authorization (EUA). This EUA will remain in effect (meaning this test can be used) for the duration of the COVID-19 declaration under Section 564(b)(1) of the Act, 21 U.S.C. section 360bbb-3(b)(1), unless the authorization is terminated or revoked.     Resp Syncytial Virus by PCR NEGATIVE NEGATIVE Final    Comment: (NOTE) Fact Sheet for Patients: BloggerCourse.com  Fact Sheet for Healthcare Providers: SeriousBroker.it  This test is not yet approved or cleared by the [place] FDA and has been  authorized for detection and/or diagnosis of SARS-CoV-2 by FDA under an Emergency Use Authorization (EUA). This EUA will remain in effect (meaning this test can be used) for the duration of the COVID-19 declaration under Section 564(b)(1) of the Act, 21 U.S.C. section 360bbb-3(b)(1), unless the authorization is terminated or revoked.  Performed at [facility], [address] [place], [place] [address]   Blood culture (routine x 2)     Status: Abnormal (Preliminary result)   Collection Time: 05/14/23  5:56 PM   Specimen: BLOOD  Result Value Ref Range Status   Specimen Description   Final  BLOOD LEFT ANTECUBITAL Performed at [facility], [address], [place], [place] [address]    Special Requests   Final    BOTTLES DRAWN AEROBIC AND ANAEROBIC Blood Culture results may not be optimal due to an inadequate volume of blood received in culture bottles Performed at [facility], [address], [place], [place] [address]    Culture  Setup Time   Final    GRAM POSITIVE RODS AEROBIC BOTTLE ONLY CRITICAL RESULT CALLED TO, READ BACK BY AND VERIFIED WITH: Tamala Bari PHARMD, AT 0981 05/15/23 D. VANHOOK GRAM POSITIVE COCCI ANAEROBIC BOTTLE ONLY CRITICAL RESULT CALLED TO, READ BACK BY AND VERIFIED WITH: PHARMD MELISSA JAMES ON 05/15/23 @ 1544 BY DRT    Culture (A)  Final    STAPHYLOCOCCUS HOMINIS THE SIGNIFICANCE OF ISOLATING THIS ORGANISM FROM A SINGLE SET OF BLOOD CULTURES WHEN MULTIPLE SETS ARE DRAWN IS UNCERTAIN. PLEASE NOTIFY THE MICROBIOLOGY DEPARTMENT WITHIN ONE WEEK IF SPECIATION AND SENSITIVITIES ARE REQUIRED. BACILLUS SPECIES Standardized susceptibility testing for this organism is not available. Performed at [facility], [address], [place], [place] [address]    Report Status PENDING  Incomplete  Blood Culture ID Panel (Reflexed)     Status: Abnormal   Collection Time: 05/14/23  5:56 PM  Result Value Ref Range  Status   Enterococcus faecalis NOT DETECTED NOT DETECTED Final   Enterococcus Faecium NOT DETECTED NOT DETECTED Final   Listeria monocytogenes NOT DETECTED NOT DETECTED Final   Staphylococcus species DETECTED (A) NOT DETECTED Final    Comment: CRITICAL RESULT CALLED TO, READ BACK BY AND VERIFIED WITH: PHARMD MELISSA JAMES ON 05/15/23 @ 1544 BY DRT    Staphylococcus aureus (BCID) NOT DETECTED NOT DETECTED Final   Staphylococcus epidermidis NOT DETECTED NOT DETECTED Final   Staphylococcus lugdunensis NOT DETECTED NOT DETECTED Final   Streptococcus species NOT DETECTED NOT DETECTED Final   Streptococcus agalactiae NOT DETECTED NOT DETECTED Final   Streptococcus pneumoniae NOT DETECTED NOT DETECTED Final   Streptococcus pyogenes NOT DETECTED NOT DETECTED Final   A.calcoaceticus-baumannii NOT DETECTED NOT DETECTED Final   Bacteroides fragilis NOT DETECTED NOT DETECTED Final   Enterobacterales NOT DETECTED NOT DETECTED Final   Enterobacter cloacae complex NOT DETECTED NOT DETECTED Final   Escherichia coli NOT DETECTED NOT DETECTED Final   Klebsiella aerogenes NOT DETECTED NOT DETECTED Final   Klebsiella oxytoca NOT DETECTED NOT DETECTED Final   Klebsiella pneumoniae NOT DETECTED NOT DETECTED Final   Proteus species NOT DETECTED NOT DETECTED Final   Salmonella species NOT DETECTED NOT DETECTED Final   Serratia marcescens NOT DETECTED NOT DETECTED Final   Haemophilus influenzae NOT DETECTED NOT DETECTED Final   Neisseria meningitidis NOT DETECTED NOT DETECTED Final   Pseudomonas aeruginosa NOT DETECTED NOT DETECTED Final   Stenotrophomonas maltophilia NOT DETECTED NOT DETECTED Final   Candida albicans NOT DETECTED NOT DETECTED Final   Candida auris NOT DETECTED NOT DETECTED Final   Candida glabrata NOT DETECTED NOT DETECTED Final   Candida krusei NOT DETECTED NOT DETECTED Final   Candida parapsilosis NOT DETECTED NOT DETECTED Final   Candida tropicalis NOT DETECTED NOT DETECTED Final    Cryptococcus neoformans/gattii NOT DETECTED NOT DETECTED Final    Comment: Performed at [facility], [address] [place], [place] [address]  Blood culture (routine x 2)     Status: None (Preliminary result)   Collection Time: 05/14/23  8:39 PM   Specimen: BLOOD  Result Value Ref Range  Status   Specimen Description   Final    BLOOD BLOOD LEFT FOREARM Performed at [facility], [address] [place], [place] [address]    Special Requests   Final    BOTTLES DRAWN AEROBIC ONLY Blood Culture results may not be optimal due to an inadequate volume of blood received in culture bottles Performed at [facility], [address] [place], [place] [address]    Culture   Final    NO GROWTH 2 DAYS Performed at [facility], [address], [place], [place] [address]    Report Status PENDING  Incomplete  Urine Culture (for pregnant, neutropenic or urologic patients or patients with an indwelling urinary catheter)     Status: Abnormal   Collection Time: 05/14/23  8:50 PM   Specimen: Urine, Clean Catch  Result Value Ref Range Status   Specimen Description   Final    URINE, CLEAN CATCH Performed at [facility], [address] [place], [place] [address]    Special Requests   Final    NONE Performed at [facility], [address] [place], [place] [address]    Culture >=100,000 COLONIES/mL ESCHERICHIA COLI (A)  Final   Report Status 05/16/2023 FINAL  Final   Organism ID, Bacteria ESCHERICHIA COLI (A)  Final      Susceptibility   Escherichia coli - MIC*    AMPICILLIN <=2 SENSITIVE Sensitive     CEFAZOLIN <=4 SENSITIVE Sensitive     CEFEPIME <=0.12 SENSITIVE Sensitive     CEFTRIAXONE <=0.25 SENSITIVE Sensitive     CIPROFLOXACIN <=0.25 SENSITIVE Sensitive     GENTAMICIN <=1 SENSITIVE Sensitive     IMIPENEM <=0.25 SENSITIVE Sensitive     NITROFURANTOIN <=16 SENSITIVE Sensitive     TRIMETH/SULFA <=20  SENSITIVE Sensitive     AMPICILLIN/SULBACTAM <=2 SENSITIVE Sensitive     PIP/TAZO <=4 SENSITIVE Sensitive ug/mL    * >=100,000 COLONIES/mL ESCHERICHIA COLI  MRSA Next Gen by PCR, Nasal     Status: None   Collection Time: 05/15/23  5:26 PM   Specimen: Nasal Mucosa; Nasal Swab  Result Value Ref Range Status   MRSA by PCR Next Gen NOT DETECTED NOT DETECTED Final    Comment: (NOTE) The GeneXpert MRSA Assay (FDA approved for NASAL specimens only), is one component of a comprehensive MRSA colonization surveillance program. It is not intended to diagnose MRSA infection nor to guide or monitor treatment for MRSA infections. Test performance is not FDA approved in patients less than 65 years old. Performed at [facility], [address] [place], [place] [address]          Radiology Studies: EEG adult Result Date: 05/16/2023 Charlsie Quest, MD     05/16/2023  9:06 PM Patient Name: SKYYLAR KOPF MRN: [id number] Epilepsy Attending: Charlsie Quest Referring Physician/Provider: Alba Cory, MD Date: 05/16/2023 Duration: 23.13 mins Patient history: 63yo F with ams. EEG to evaluate for seizure Level of alertness: comatose/ lethargic AEDs during EEG study: GBP, CBZ Technical aspects: This EEG study was done with scalp electrodes positioned according to the 10-20 International system of electrode placement. Electrical activity was reviewed with band pass filter of 1-70Hz , sensitivity of 7 uV/mm, display speed of 43mm/sec with a 60Hz  notched filter applied as appropriate. EEG data were recorded continuously and digitally stored.  Video monitoring was available and reviewed as appropriate. Description: EEG showed continuous generalized low amplitude  3 to  6 Hz theta-delta slowing. Sharp transients were noted in right occipital region. Hyperventilation and photic stimulation were not performed.   ABNORMALITY - Continuous slow, generalized IMPRESSION: This study is suggestive of  severe diffuse encephalopathy. No seizures or definite epileptiform discharges were seen throughout the recording. Charlsie Quest   MR BRAIN WO CONTRAST Result Date: 05/16/2023 CLINICAL DATA:  Altered mental status EXAM: MRI HEAD WITHOUT CONTRAST TECHNIQUE: Multiplanar, multiecho pulse sequences of the brain and surrounding structures were obtained without intravenous contrast. COMPARISON:  None Available. FINDINGS: Brain: No acute infarct, mass effect or extra-axial collection. No acute or chronic hemorrhage. There is multifocal hyperintense T2-weighted signal within the white matter. There is advanced atrophy. Cavum septum pellucidum et vergae. Vascular: Normal flow voids. Skull and upper cervical spine: Normal calvarium and skull base. Visualized upper cervical spine and soft tissues are normal. Sinuses/Orbits:No paranasal sinus fluid levels or advanced mucosal thickening. No mastoid or middle ear effusion. Normal orbits. IMPRESSION: 1. No acute intracranial abnormality. 2. Advanced atrophy and findings of chronic small vessel ischemia. Electronically Signed   By: Deatra Robinson M.D.   On: 05/16/2023 20:33   ECHOCARDIOGRAM COMPLETE Result Date: 05/16/2023    ECHOCARDIOGRAM REPORT   Patient Name:   ALEECE LOYD Date of Exam: 05/16/2023 Medical Rec #:  [id number]        Height:       65.0 in Accession #:    [id number]       Weight:       90.4 lb Date of Birth:  02/02/1961        BSA:          1.411 m Patient Age:    63 years         BP:           92/71 mmHg Patient Gender: F                HR:           68 bpm. Exam Location:  Inpatient Procedure: 2D Echo, Cardiac Doppler and Color Doppler (Both Spectral and Color            Flow Doppler were utilized during procedure). Indications:    Bacteremia  History:        Patient has no prior history of Echocardiogram examinations.                 Risk Factors:Hypertension.  Sonographer:    Amy Chionchio Referring Phys: [id number] ERIC J [place]  Sonographer Comments:  Immobility. IMPRESSIONS  1. Technically difficult study. Left ventricular ejection fraction, by estimation, is 60 to 65%. The left ventricle has normal function. The left ventricle has no regional wall motion abnormalities. There is mild left ventricular hypertrophy. Left ventricular diastolic parameters are indeterminate.  2. Right ventricular systolic function is normal. The right ventricular size is normal.  3. The mitral valve is normal in structure. No evidence of mitral valve regurgitation.  4. The aortic valve was not well visualized. Aortic valve regurgitation is not visualized. No aortic stenosis is present. FINDINGS  Left Ventricle: Left ventricular ejection fraction, by estimation, is 60 to 65%. The left ventricle has normal function. The left ventricle has no regional wall motion abnormalities. The left ventricular internal cavity size was normal in size. There is  mild left ventricular hypertrophy. Left ventricular diastolic parameters are indeterminate. Right Ventricle: The right ventricular size is normal. No increase in right ventricular wall thickness. Right ventricular systolic function is normal.  Left Atrium: Left atrial size was normal in size. Right Atrium: Right atrial size was normal in size. Pericardium: Trivial pericardial effusion is present. Mitral Valve: The mitral valve is normal in structure. No evidence of mitral valve regurgitation. Tricuspid Valve: The tricuspid valve is normal in structure. Tricuspid valve regurgitation is trivial. Aortic Valve: The aortic valve was not well visualized. Aortic valve regurgitation is not visualized. No aortic stenosis is present. Aortic valve peak gradient measures 2.1 mmHg. Pulmonic Valve: The pulmonic valve was not well visualized. Pulmonic valve regurgitation is not visualized. Aorta: The aortic root is normal in size and structure. IAS/Shunts: The interatrial septum was not well visualized.  LEFT VENTRICLE PLAX 2D LVIDd:         4.20 cm LVIDs:          3.20 cm LV PW:         1.10 cm LV IVS:        0.80 cm LVOT diam:     1.90 cm LVOT Area:     2.84 cm  RIGHT VENTRICLE TAPSE (M-mode): 1.0 cm AORTIC VALVE                PULMONIC VALVE AV Area (Vmax): 1.99 cm    PV Vmax:       0.57 m/s AV Vmax:        72.40 cm/s  PV Peak grad:  1.3 mmHg AV Peak Grad:   2.1 mmHg LVOT Vmax:      50.90 cm/s TRICUSPID VALVE TR Peak grad:   18.0 mmHg TR Vmax:        212.00 cm/s  SHUNTS Systemic Diam: 1.90 cm Epifanio Lesches MD Electronically signed by Epifanio Lesches MD Signature Date/Time: 05/16/2023/11:45:59 AM    Final         Scheduled Meds:  heparin  5,000 Units Subcutaneous Q8H   sodium chloride flush  3 mL Intravenous Q12H   Continuous Infusions:  cefTRIAXone (ROCEPHIN)  IV 1 g (05/16/23 2213)   dextrose 100 mL/hr at 05/16/23 0959   potassium chloride     thiamine (VITAMIN B1) injection     vancomycin 750 mg (05/17/23 0539)     LOS: 2 days    Time spent: 35 minutes    Dreshaun Stene A Valory Wetherby, MD [facility]   If 7PM-7AM, please contact night-coverage [email or web address]  05/17/2023, 7:45 AM

## 2023-05-17 NOTE — Plan of Care (Addendum)
 VSS. BG stable. Daughter updated on patient's status. D5 infusing at 156ml/hr. No acute events overnight.  Problem: Education: Goal: Knowledge of General Education information will improve Description: Including pain rating scale, medication(s)/side effects and non-pharmacologic comfort measures Outcome: Progressing   Problem: Clinical Measurements: Goal: Ability to maintain clinical measurements within normal limits will improve Outcome: Progressing Goal: Will remain free from infection Outcome: Progressing   Problem: Nutrition: Goal: Adequate nutrition will be maintained Outcome: Progressing   Problem: Safety: Goal: Ability to remain free from injury will improve Outcome: Progressing   Problem: Skin Integrity: Goal: Risk for impaired skin integrity will decrease Outcome: Progressing

## 2023-05-18 DIAGNOSIS — E870 Hyperosmolality and hypernatremia: Secondary | ICD-10-CM | POA: Diagnosis not present

## 2023-05-18 LAB — CBC
HCT: 35.1 % — ABNORMAL LOW (ref 36.0–46.0)
Hemoglobin: 10.8 g/dL — ABNORMAL LOW (ref 12.0–15.0)
MCH: 31.2 pg (ref 26.0–34.0)
MCHC: 30.8 g/dL (ref 30.0–36.0)
MCV: 101.4 fL — ABNORMAL HIGH (ref 80.0–100.0)
Platelets: 174 10*3/uL (ref 150–400)
RBC: 3.46 MIL/uL — ABNORMAL LOW (ref 3.87–5.11)
RDW: 12.7 % (ref 11.5–15.5)
WBC: 3.8 10*3/uL — ABNORMAL LOW (ref 4.0–10.5)
nRBC: 0 % (ref 0.0–0.2)

## 2023-05-18 LAB — BASIC METABOLIC PANEL WITH GFR
Anion gap: 8 (ref 5–15)
BUN: 6 mg/dL — ABNORMAL LOW (ref 8–23)
CO2: 21 mmol/L — ABNORMAL LOW (ref 22–32)
Calcium: 8.4 mg/dL — ABNORMAL LOW (ref 8.9–10.3)
Chloride: 109 mmol/L (ref 98–111)
Creatinine, Ser: 0.39 mg/dL — ABNORMAL LOW (ref 0.44–1.00)
GFR, Estimated: >60 mL/min (ref >60)
Glucose, Bld: 97 mg/dL (ref 70–99)
Potassium: 3.2 mmol/L — ABNORMAL LOW (ref 3.5–5.1)
Sodium: 138 mmol/L (ref 135–145)

## 2023-05-18 MED ORDER — CARBAMAZEPINE 200 MG PO TABS: 200.0000 mg | ORAL_TABLET | Freq: Every day | ORAL | 0 refills | Status: AC

## 2023-05-18 MED ORDER — THIAMINE HCL 100 MG PO TABS: 100.0000 mg | ORAL_TABLET | Freq: Every day | ORAL | 0 refills | Status: AC

## 2023-05-18 MED ORDER — GABAPENTIN 300 MG PO CAPS: 300.0000 mg | ORAL_CAPSULE | Freq: Every day | ORAL | 0 refills | Status: AC

## 2023-05-18 MED ORDER — CEFDINIR 300 MG PO CAPS
300.0000 mg | ORAL_CAPSULE | Freq: Two times a day (BID) | ORAL | 0 refills | Status: AC
Start: 2023-05-18 — End: 2023-05-21

## 2023-05-18 MED ORDER — HYDROXYZINE HCL 25 MG PO TABS: 12.5000 mg | ORAL_TABLET | Freq: Three times a day (TID) | ORAL | 0 refills | Status: AC | PRN

## 2023-05-18 MED ORDER — POTASSIUM CHLORIDE CRYS ER 20 MEQ PO TBCR
40.0000 meq | EXTENDED_RELEASE_TABLET | Freq: Once | ORAL | Status: AC
  Administered 2023-05-18: 40 meq via ORAL
  Filled 2023-05-18: qty 2

## 2023-05-18 NOTE — Progress Notes (Signed)
 Report called to April at [facility].

## 2023-05-18 NOTE — Discharge Summary (Signed)
 Physician Discharge Summary   Patient: Sherry Hicks MRN: [id number] DOB: 12/18/1960  Admit date:     05/14/2023  Discharge date: 05/18/23  Discharge Physician: Alba Cory   PCP: Sherron Monday, MD   Recommendations at discharge:    Encourage hydration and oral intake.  Tegretol and gabapentin change to HS to prevent oversedation during the day.   Discharge Diagnoses: Principal Problem:   Hypernatremia Active Problems:   Urinary tract infection   Early Onset Dementia with behavioral problem (HCC)   Hypokalemia   Acute metabolic encephalopathy   Do not resuscitate   UTI (urinary tract infection)  Resolved Problems:   * No resolved hospital problems. *  Hospital Course: 63 year old with past medical history significant for advanced Alzheimer's dementia with chronic bedbound and nonverbal status, contractures to bilateral lower extremity presented to the ED from her facility for evaluation and change in mental status.  Patient is not able to provide history due to advanced dementia.  Per facility patient was noted to have this diminished mentation and level of interaction.  She is chronically bedbound and largely nonverbal.  On arrival to the ED she was very somnolent and difficult to arouse, withdrawing only to painful stimuli.  Subsequently became a little bit more alert and eyes were open.   Evaluation in the ED consistent with hyponatremia, UTI, acute metabolic encephalopathy  Assessment and Plan: 1-Acute Metabolic Encephalopathy superimposed on advanced Dementia: -Patient bedbound, nonverbal, cachectic at baseline.  Noted to have decreased level of interaction at her facility compared to baseline. -Atarax change o PRN -Continue with IV fluids -Encephalopathy in the setting of hypernatremia and UTI -MRI negative fro stroke and EEG showed encephalopathy/  Likely Delirium, she is more awake today.  Tegretol change to HS, and gabapentin as well. She was on tegretol  for mood disorder.  She is alert, tolerating diet.   2-1 out of 4 blood cultures positive for staph 2D echo: Ejection fraction 65%, mitral valve is normal in structure, aortic valve was not well visualized.  Tricuspid valve is normal in structure -Follow repeated blood cultures from 4/2 -Blood cultures from 3/31 staph species detected. -likely contaminate, staph hominis. Discontinue Vancomycin.    3-Hypernatremia: in the doing of poor oral intake: Treated  with IV fluids   4-Urinary tract infection: UA consistent with UTI.  Urine culture growing 100 K E coli -Treated with ceftriaxone. Discharge on Cefdinir for 3 more days.    5-1 out of 4 blood cultures growing gram-negative rods suspect contaminant on IV ceftriaxone   Hypokalemia: Replaced       See  wound care documentation below     Pressure Injury 05/17/20 Left Stage 2 -  Partial thickness loss of dermis presenting as a shallow open injury with a red, pink wound bed without slough. (Active)  05/17/20 1236  Location:   Location Orientation: Left  Staging: Stage 2 -  Partial thickness loss of dermis presenting as a shallow open injury with a red, pink wound bed without slough.  Wound Description (Comments):   Present on Admission:      Pressure Injury 05/15/23 Buttocks Stage 2 -  Partial thickness loss of dermis presenting as a shallow open injury with a red, pink wound bed without slough. (Active)  05/15/23 0046  Location: Buttocks  Location Orientation:   Staging: Stage 2 -  Partial thickness loss of dermis presenting as a shallow open injury with a red, pink wound bed without slough.  Wound Description (Comments):  Present on Admission: Yes  Dressing Type Foam - Lift dressing to assess site every shift 05/16/23 2104        Estimated body mass index is 15.04 kg/m as calculated from the following:   Height as of this encounter: 5\' 5"  (1.651 m).   Weight as of this encounter: 41 kg.           Consultants:None   Procedures performed:  Disposition: Skilled nursing facility Diet recommendation:  Dysphagia type 3 thin Liquid DISCHARGE MEDICATION: Allergies as of 05/18/2023       Reactions   Hydrocodone-acetaminophen Nausea And Vomiting, Other (See Comments)   GI Intolerance and "allergic," per MAR   Donepezil Other (See Comments)   Urinary Incontinence and "allergic," per [facility]        Medication List     TAKE these medications    carbamazepine 200 MG tablet Commonly known as: TEGRETOL Take 1 tablet (200 mg total) by mouth at bedtime. What changed:  how much to take when to take this   cefdinir 300 MG capsule Commonly known as: OMNICEF Take 1 capsule (300 mg total) by mouth 2 (two) times daily for 3 days.   gabapentin 300 MG capsule Commonly known as: NEURONTIN Take 1 capsule (300 mg total) by mouth at bedtime. What changed:  medication strength how much to take when to take this   hydrOXYzine 25 MG tablet Commonly known as: ATARAX Take 0.5 tablets (12.5 mg total) by mouth 3 (three) times daily as needed for anxiety. What changed:  when to take this reasons to take this   PROTEIN PO Take by mouth 2 (two) times daily.   thiamine 100 MG tablet Commonly known as: VITAMIN B1 Take 1 tablet (100 mg total) by mouth daily.               Discharge Care Instructions  (From admission, onward)           Start     Ordered   05/18/23 0000  Discharge wound care:       Comments: See above   05/18/23 1200            Discharge Exam: Filed Weights   05/14/23 1642  Weight: 41 kg   General; alert  Condition at discharge: stable  The results of significant diagnostics from this hospitalization (including imaging, microbiology, ancillary and laboratory) are listed below for reference.   Imaging Studies: EEG adult Result Date: 05/16/2023 Charlsie Quest, MD     05/16/2023  9:06 PM Patient Name: Sherry Hicks MRN: [id number] Epilepsy Attending: Charlsie Quest  Referring Physician/Provider: Alba Cory, MD Date: 05/16/2023 Duration: 23.13 mins Patient history: 63yo F with ams. EEG to evaluate for seizure Level of alertness: comatose/ lethargic AEDs during EEG study: GBP, CBZ Technical aspects: This EEG study was done with scalp electrodes positioned according to the 10-20 International system of electrode placement. Electrical activity was reviewed with band pass filter of 1-70Hz , sensitivity of 7 uV/mm, display speed of 45mm/sec with a 60Hz  notched filter applied as appropriate. EEG data were recorded continuously and digitally stored.  Video monitoring was available and reviewed as appropriate. Description: EEG showed continuous generalized low amplitude  3 to 6 Hz theta-delta slowing. Sharp transients were noted in right occipital region. Hyperventilation and photic stimulation were not performed.   ABNORMALITY - Continuous slow, generalized IMPRESSION: This study is suggestive of severe diffuse encephalopathy. No seizures or definite epileptiform discharges were seen throughout the recording. Priyanka  Annabelle Harman   MR BRAIN WO CONTRAST Result Date: 05/16/2023 CLINICAL DATA:  Altered mental status EXAM: MRI HEAD WITHOUT CONTRAST TECHNIQUE: Multiplanar, multiecho pulse sequences of the brain and surrounding structures were obtained without intravenous contrast. COMPARISON:  None Available. FINDINGS: Brain: No acute infarct, mass effect or extra-axial collection. No acute or chronic hemorrhage. There is multifocal hyperintense T2-weighted signal within the white matter. There is advanced atrophy. Cavum septum pellucidum et vergae. Vascular: Normal flow voids. Skull and upper cervical spine: Normal calvarium and skull base. Visualized upper cervical spine and soft tissues are normal. Sinuses/Orbits:No paranasal sinus fluid levels or advanced mucosal thickening. No mastoid or middle ear effusion. Normal orbits. IMPRESSION: 1. No acute intracranial abnormality. 2.  Advanced atrophy and findings of chronic small vessel ischemia. Electronically Signed   By: Deatra Robinson M.D.   On: 05/16/2023 20:33   ECHOCARDIOGRAM COMPLETE Result Date: 05/16/2023    ECHOCARDIOGRAM REPORT   Patient Name:   TOSHIE DEMELO Date of Exam: 05/16/2023 Medical Rec #:  [id number]        Height:       65.0 in Accession #:    [id number]       Weight:       90.4 lb Date of Birth:  1960/11/21        BSA:          1.411 m Patient Age:    63 years         BP:           92/71 mmHg Patient Gender: F                HR:           68 bpm. Exam Location:  Inpatient Procedure: 2D Echo, Cardiac Doppler and Color Doppler (Both Spectral and Color            Flow Doppler were utilized during procedure). Indications:    Bacteremia  History:        Patient has no prior history of Echocardiogram examinations.                 Risk Factors:Hypertension.  Sonographer:    Amy Chionchio Referring Phys: [id number] ERIC J [place]  Sonographer Comments: Immobility. IMPRESSIONS  1. Technically difficult study. Left ventricular ejection fraction, by estimation, is 60 to 65%. The left ventricle has normal function. The left ventricle has no regional wall motion abnormalities. There is mild left ventricular hypertrophy. Left ventricular diastolic parameters are indeterminate.  2. Right ventricular systolic function is normal. The right ventricular size is normal.  3. The mitral valve is normal in structure. No evidence of mitral valve regurgitation.  4. The aortic valve was not well visualized. Aortic valve regurgitation is not visualized. No aortic stenosis is present. FINDINGS  Left Ventricle: Left ventricular ejection fraction, by estimation, is 60 to 65%. The left ventricle has normal function. The left ventricle has no regional wall motion abnormalities. The left ventricular internal cavity size was normal in size. There is  mild left ventricular hypertrophy. Left ventricular diastolic parameters are indeterminate. Right Ventricle:  The right ventricular size is normal. No increase in right ventricular wall thickness. Right ventricular systolic function is normal. Left Atrium: Left atrial size was normal in size. Right Atrium: Right atrial size was normal in size. Pericardium: Trivial pericardial effusion is present. Mitral Valve: The mitral valve is normal in structure. No evidence of mitral valve regurgitation. Tricuspid Valve: The tricuspid valve is normal in  structure. Tricuspid valve regurgitation is trivial. Aortic Valve: The aortic valve was not well visualized. Aortic valve regurgitation is not visualized. No aortic stenosis is present. Aortic valve peak gradient measures 2.1 mmHg. Pulmonic Valve: The pulmonic valve was not well visualized. Pulmonic valve regurgitation is not visualized. Aorta: The aortic root is normal in size and structure. IAS/Shunts: The interatrial septum was not well visualized.  LEFT VENTRICLE PLAX 2D LVIDd:         4.20 cm LVIDs:         3.20 cm LV PW:         1.10 cm LV IVS:        0.80 cm LVOT diam:     1.90 cm LVOT Area:     2.84 cm  RIGHT VENTRICLE TAPSE (M-mode): 1.0 cm AORTIC VALVE                PULMONIC VALVE AV Area (Vmax): 1.99 cm    PV Vmax:       0.57 m/s AV Vmax:        72.40 cm/s  PV Peak grad:  1.3 mmHg AV Peak Grad:   2.1 mmHg LVOT Vmax:      50.90 cm/s TRICUSPID VALVE TR Peak grad:   18.0 mmHg TR Vmax:        212.00 cm/s  SHUNTS Systemic Diam: 1.90 cm Epifanio Lesches MD Electronically signed by Epifanio Lesches MD Signature Date/Time: 05/16/2023/11:45:59 AM    Final    CT Head Wo Contrast Result Date: 05/14/2023 CLINICAL DATA:  Altered mental status EXAM: CT HEAD WITHOUT CONTRAST TECHNIQUE: Contiguous axial images were obtained from the base of the skull through the vertex without intravenous contrast. RADIATION DOSE REDUCTION: This exam was performed according to the departmental dose-optimization program which includes automated exposure control, adjustment of the mA and/or kV  according to patient size and/or use of iterative reconstruction technique. COMPARISON:  Head CT 10/27/2022 FINDINGS: Brain: No evidence of acute infarction, hemorrhage, extra-axial collection or mass lesion/mass effect. There is mild periventricular white matter hypodensity, cavum septum pellucidum and vergae again noted. There is stable moderate chronic dilatation of the ventricular system. Vascular: No hyperdense vessel or unexpected calcification. Skull: Normal. Negative for fracture or focal lesion. Sinuses/Orbits: No acute finding. Other: None. IMPRESSION: 1. No acute intracranial process. 2. Stable moderate chronic dilatation of the ventricular system. Electronically Signed   By: Darliss Cheney M.D.   On: 05/14/2023 17:54   DG Chest Portable 1 View Result Date: 05/14/2023 CLINICAL DATA:  Cough.  Altered mental status. EXAM: PORTABLE CHEST 1 VIEW COMPARISON:  Radiographs 10/27/2022 and 02/22/2022. FINDINGS: 1611 hours. Moderate rotation to the left. Allowing for this, the heart size and mediastinal contours are stable. The lungs appear clear. There is no evidence of pleural effusion or pneumothorax. The bones appear unchanged. Telemetry leads overlie the chest. IMPRESSION: No evidence of acute cardiopulmonary process. Rotated exam. Electronically Signed   By: Carey Bullocks M.D.   On: 05/14/2023 16:43    Microbiology: Results for orders placed or performed during the hospital encounter of 05/14/23  Resp panel by RT-PCR (RSV, Flu A&B, Covid) Anterior Nasal Swab     Status: None   Collection Time: 05/14/23  4:18 PM   Specimen: Anterior Nasal Swab  Result Value Ref Range Status   SARS Coronavirus 2 by RT PCR NEGATIVE NEGATIVE Final    Comment: (NOTE) SARS-CoV-2 target nucleic acids are NOT DETECTED.  The SARS-CoV-2 RNA is generally detectable in upper respiratory  specimens during the acute phase of infection. The lowest concentration of SARS-CoV-2 viral copies this assay can detect is 138  copies/mL. A negative result does not preclude SARS-Cov-2 infection and should not be used as the sole basis for treatment or other patient management decisions. A negative result may occur with  improper specimen collection/handling, submission of specimen other than nasopharyngeal swab, presence of viral mutation(s) within the areas targeted by this assay, and inadequate number of viral copies(<138 copies/mL). A negative result must be combined with clinical observations, patient history, and epidemiological information. The expected result is Negative.  Fact Sheet for Patients:  BloggerCourse.com  Fact Sheet for Healthcare Providers:  SeriousBroker.it  This test is no t yet approved or cleared by the [place] FDA and  has been authorized for detection and/or diagnosis of SARS-CoV-2 by FDA under an Emergency Use Authorization (EUA). This EUA will remain  in effect (meaning this test can be used) for the duration of the COVID-19 declaration under Section 564(b)(1) of the Act, 21 U.S.C.section 360bbb-3(b)(1), unless the authorization is terminated  or revoked sooner.       Influenza A by PCR NEGATIVE NEGATIVE Final   Influenza B by PCR NEGATIVE NEGATIVE Final    Comment: (NOTE) The Xpert Xpress SARS-CoV-2/FLU/RSV plus assay is intended as an aid in the diagnosis of influenza from Nasopharyngeal swab specimens and should not be used as a sole basis for treatment. Nasal washings and aspirates are unacceptable for Xpert Xpress SARS-CoV-2/FLU/RSV testing.  Fact Sheet for Patients: BloggerCourse.com  Fact Sheet for Healthcare Providers: SeriousBroker.it  This test is not yet approved or cleared by the [place] FDA and has been authorized for detection and/or diagnosis of SARS-CoV-2 by FDA under an Emergency Use Authorization (EUA). This EUA will remain in effect (meaning  this test can be used) for the duration of the COVID-19 declaration under Section 564(b)(1) of the Act, 21 U.S.C. section 360bbb-3(b)(1), unless the authorization is terminated or revoked.     Resp Syncytial Virus by PCR NEGATIVE NEGATIVE Final    Comment: (NOTE) Fact Sheet for Patients: BloggerCourse.com  Fact Sheet for Healthcare Providers: SeriousBroker.it  This test is not yet approved or cleared by the [place] FDA and has been authorized for detection and/or diagnosis of SARS-CoV-2 by FDA under an Emergency Use Authorization (EUA). This EUA will remain in effect (meaning this test can be used) for the duration of the COVID-19 declaration under Section 564(b)(1) of the Act, 21 U.S.C. section 360bbb-3(b)(1), unless the authorization is terminated or revoked.  Performed at [facility], [address] [place], [place] [address]   Blood culture (routine x 2)     Status: Abnormal   Collection Time: 05/14/23  5:56 PM   Specimen: BLOOD  Result Value Ref Range Status   Specimen Description   Final    BLOOD LEFT ANTECUBITAL Performed at [facility], [address] [place], [place] [address]    Special Requests   Final    BOTTLES DRAWN AEROBIC AND ANAEROBIC Blood Culture results may not be optimal due to an inadequate volume of blood received in culture bottles Performed at [facility], [address] [place], [place] [address]    Culture  Setup Time   Final    GRAM POSITIVE RODS AEROBIC BOTTLE ONLY CRITICAL RESULT CALLED TO, READ BACK BY AND VERIFIED WITH: Tamala Bari PHARMD, AT 9147 05/15/23 D. VANHOOK GRAM POSITIVE COCCI ANAEROBIC BOTTLE ONLY CRITICAL RESULT CALLED TO,  READ BACK BY AND VERIFIED WITH: PHARMD MELISSA JAMES ON 05/15/23 @ 1544 BY DRT    Culture (A)  Final    STAPHYLOCOCCUS HOMINIS THE SIGNIFICANCE OF ISOLATING THIS ORGANISM FROM A SINGLE SET OF  BLOOD CULTURES WHEN MULTIPLE SETS ARE DRAWN IS UNCERTAIN. PLEASE NOTIFY THE MICROBIOLOGY DEPARTMENT WITHIN ONE WEEK IF SPECIATION AND SENSITIVITIES ARE REQUIRED. BACILLUS SPECIES Standardized susceptibility testing for this organism is not available. Performed at [facility], [address], [place], [place] [address]    Report Status 05/17/2023 FINAL  Final  Blood Culture ID Panel (Reflexed)     Status: Abnormal   Collection Time: 05/14/23  5:56 PM  Result Value Ref Range Status   Enterococcus faecalis NOT DETECTED NOT DETECTED Final   Enterococcus Faecium NOT DETECTED NOT DETECTED Final   Listeria monocytogenes NOT DETECTED NOT DETECTED Final   Staphylococcus species DETECTED (A) NOT DETECTED Final    Comment: CRITICAL RESULT CALLED TO, READ BACK BY AND VERIFIED WITH: PHARMD MELISSA JAMES ON 05/15/23 @ 1544 BY DRT    Staphylococcus aureus (BCID) NOT DETECTED NOT DETECTED Final   Staphylococcus epidermidis NOT DETECTED NOT DETECTED Final   Staphylococcus lugdunensis NOT DETECTED NOT DETECTED Final   Streptococcus species NOT DETECTED NOT DETECTED Final   Streptococcus agalactiae NOT DETECTED NOT DETECTED Final   Streptococcus pneumoniae NOT DETECTED NOT DETECTED Final   Streptococcus pyogenes NOT DETECTED NOT DETECTED Final   A.calcoaceticus-baumannii NOT DETECTED NOT DETECTED Final   Bacteroides fragilis NOT DETECTED NOT DETECTED Final   Enterobacterales NOT DETECTED NOT DETECTED Final   Enterobacter cloacae complex NOT DETECTED NOT DETECTED Final   Escherichia coli NOT DETECTED NOT DETECTED Final   Klebsiella aerogenes NOT DETECTED NOT DETECTED Final   Klebsiella oxytoca NOT DETECTED NOT DETECTED Final   Klebsiella pneumoniae NOT DETECTED NOT DETECTED Final   Proteus species NOT DETECTED NOT DETECTED Final   Salmonella species NOT DETECTED NOT DETECTED Final   Serratia marcescens NOT DETECTED NOT DETECTED Final   Haemophilus influenzae NOT DETECTED NOT DETECTED Final    Neisseria meningitidis NOT DETECTED NOT DETECTED Final   Pseudomonas aeruginosa NOT DETECTED NOT DETECTED Final   Stenotrophomonas maltophilia NOT DETECTED NOT DETECTED Final   Candida albicans NOT DETECTED NOT DETECTED Final   Candida auris NOT DETECTED NOT DETECTED Final   Candida glabrata NOT DETECTED NOT DETECTED Final   Candida krusei NOT DETECTED NOT DETECTED Final   Candida parapsilosis NOT DETECTED NOT DETECTED Final   Candida tropicalis NOT DETECTED NOT DETECTED Final   Cryptococcus neoformans/gattii NOT DETECTED NOT DETECTED Final    Comment: Performed at [facility], [address], [place], [place] [address]  Blood culture (routine x 2)     Status: None (Preliminary result)   Collection Time: 05/14/23  8:39 PM   Specimen: BLOOD  Result Value Ref Range Status   Specimen Description   Final    BLOOD BLOOD LEFT FOREARM Performed at [facility], [address], [place], [place] [address]    Special Requests   Final    BOTTLES DRAWN AEROBIC ONLY Blood Culture results may not be optimal due to an inadequate volume of blood received in culture bottles Performed at [facility], [address] [place], [place] [address]    Culture   Final    NO GROWTH 4 DAYS Performed at [facility], [address] [place], [place] [address]    Report Status PENDING  Incomplete  Urine Culture (for pregnant, neutropenic or urologic patients or patients with an indwelling urinary catheter)     Status: Abnormal   Collection Time: 05/14/23  8:50 PM   Specimen: Urine, Clean Catch  Result Value Ref Range Status   Specimen Description   Final    URINE, CLEAN CATCH Performed at [facility], [address], [place], [place] [address]    Special Requests   Final    NONE Performed at [facility], [address], [place], [place] [address]    Culture >=100,000 COLONIES/mL ESCHERICHIA COLI (A)  Final    Report Status 05/16/2023 FINAL  Final   Organism ID, Bacteria ESCHERICHIA COLI (A)  Final      Susceptibility   Escherichia coli - MIC*    AMPICILLIN <=2 SENSITIVE Sensitive     CEFAZOLIN <=4 SENSITIVE Sensitive     CEFEPIME <=0.12 SENSITIVE Sensitive     CEFTRIAXONE <=0.25 SENSITIVE Sensitive     CIPROFLOXACIN <=0.25 SENSITIVE Sensitive     GENTAMICIN <=1 SENSITIVE Sensitive     IMIPENEM <=0.25 SENSITIVE Sensitive     NITROFURANTOIN <=16 SENSITIVE Sensitive     TRIMETH/SULFA <=20 SENSITIVE Sensitive     AMPICILLIN/SULBACTAM <=2 SENSITIVE Sensitive     PIP/TAZO <=4 SENSITIVE Sensitive ug/mL    * >=100,000 COLONIES/mL ESCHERICHIA COLI  MRSA Next Gen by PCR, Nasal     Status: None   Collection Time: 05/15/23  5:26 PM   Specimen: Nasal Mucosa; Nasal Swab  Result Value Ref Range Status   MRSA by PCR Next Gen NOT DETECTED NOT DETECTED Final    Comment: (NOTE) The GeneXpert MRSA Assay (FDA approved for NASAL specimens only), is one component of a comprehensive MRSA colonization surveillance program. It is not intended to diagnose MRSA infection nor to guide or monitor treatment for MRSA infections. Test performance is not FDA approved in patients less than 35 years old. Performed at [facility], [address], [place], [place] [address]   Culture, blood (Routine X 2) w Reflex to ID Panel     Status: None (Preliminary result)   Collection Time: 05/16/23  5:56 AM   Specimen: BLOOD  Result Value Ref Range Status   Specimen Description   Final    BLOOD BLOOD RIGHT HAND AEROBIC BOTTLE ONLY ANAEROBIC BOTTLE ONLY Performed at [facility], [address] [place], [place] [address]    Special Requests   Final    BOTTLES DRAWN AEROBIC AND ANAEROBIC Blood Culture results may not be optimal due to an inadequate volume of blood received in culture bottles Performed at [facility], [address] [place], [place] [address]     Culture   Final    NO GROWTH 2 DAYS Performed at [facility], [address] [place], [place] [address]    Report Status PENDING  Incomplete    Labs: CBC: Recent Labs  Lab 05/14/23 1605 05/15/23 0610 05/17/23 0541 05/18/23 0549  WBC 5.3 4.7 3.7* 3.8*  NEUTROABS 2.7  --   --   --   HGB 13.1 11.7* 11.3* 10.8*  HCT 43.7 37.3 36.0 35.1*  MCV 106.6* 103.0* 100.6* 101.4*  PLT 204 174 168 174   Basic Metabolic Panel: Recent Labs  Lab 05/14/23 2039 05/15/23 0610 05/15/23 1547 05/15/23 2147 05/16/23 0554 05/17/23 0541 05/18/23 0549  NA 155* 148* 140 141 142 137 138  K 3.1* 3.4*  --   --  3.4*  3.4* 3.2*  CL 118* 116*  --   --  111 104 109  CO2 28 25  --   --  24 25 21*  GLUCOSE 94 114*  --   --  100* 108* 97  BUN 25* 19  --   --  13 6* 6*  CREATININE 0.51 0.30*  --   --  0.47 0.39* 0.39*  CALCIUM 8.8* 8.7*  --   --  8.5* 8.4* 8.4*  MG  --   --   --   --  2.2  --   --   PHOS  --   --   --   --  3.6  --   --    Liver Function Tests: Recent Labs  Lab 05/14/23 1605  AST 20  ALT 24  ALKPHOS 76  BILITOT 0.3  PROT 6.8  ALBUMIN 4.0   CBG: Recent Labs  Lab 05/16/23 2319 05/17/23 0433 05/17/23 0847 05/17/23 1224 05/17/23 1646  GLUCAP 85 114* [phone]    Discharge time spent: greater than 30 minutes.  Signed: Alba Cory, MD [facility] 05/18/2023

## 2023-05-18 NOTE — Plan of Care (Signed)

## 2023-05-18 NOTE — TOC Transition Note (Signed)
 Transition of Care [facility]) - Discharge Note   Patient Details  Name: Sherry Hicks MRN: [id number] Date of Birth: May 19, 1960  [facility] CM/SW Contact:  Otelia Santee, LCSW Phone Number: 05/18/2023, 1:35 PM   Clinical Narrative:    Pt to return to [facility]. Pt will be going to room 219A. RN to call report to [phone]. DC packet with signed DNR placed at RN station. Spoke with pt's daughter and confirmed DC plans. PTAR called at 1:35pm for transportation.    Final next level of care: Long Term Nursing Home Barriers to Discharge: Barriers Resolved   Patient Goals and CMS Choice Patient states their goals for this hospitalization and ongoing recovery are:: For pt to return to [facility].gov Compare Post Acute Care list provided to:: Patient Represenative (must comment) Choice offered to / list presented to : Adult Children [place] ownership interest in [facility].provided to:: Adult Children    Discharge Placement                Patient to be transferred to facility by: PTAR Name of family member notified: Daughter Patient and family notified of of transfer: 05/18/23  Discharge Plan and Services Additional resources added to the After Visit Summary for   In-house Referral: Clinical Social Work Discharge Planning Services: NA Post Acute Care Choice: Resumption of Svcs/PTA Provider, Nursing Home          DME Arranged: N/A DME Agency: NA                  Social Drivers of Health (SDOH) Interventions SDOH Screenings   Food Insecurity: Patient Unable To Answer (05/15/2023)  Housing: Patient Unable To Answer (05/15/2023)  Transportation Needs: Patient Unable To Answer (05/15/2023)  Utilities: Patient Unable To Answer (05/15/2023)  Depression (PHQ2-9): Low Risk  (07/08/2019)  Tobacco Use: High Risk (05/15/2023)     Readmission Risk Interventions     No data to display

## 2023-05-18 NOTE — Plan of Care (Addendum)
 VSS. Patient more interactive at start of shift. LBM 4/3. D5 infusing at 72ml/hr. No acute events overnight.   Problem: Education: Goal: Knowledge of General Education information will improve Description: Including pain rating scale, medication(s)/side effects and non-pharmacologic comfort measures Outcome: Progressing   Problem: Clinical Measurements: Goal: Ability to maintain clinical measurements within normal limits will improve Outcome: Progressing Goal: Will remain free from infection Outcome: Progressing   Problem: Nutrition: Goal: Adequate nutrition will be maintained Outcome: Progressing   Problem: Safety: Goal: Ability to remain free from injury will improve Outcome: Progressing   Problem: Skin Integrity: Goal: Risk for impaired skin integrity will decrease Outcome: Progressing

## 2023-05-19 LAB — CULTURE, BLOOD (ROUTINE X 2): Culture: NO GROWTH

## 2023-05-19 LAB — VITAMIN B1: Vitamin B1 (Thiamine): 82.5 nmol/L (ref 66.5–200.0)

## 2023-05-21 DIAGNOSIS — N390 Urinary tract infection, site not specified: Secondary | ICD-10-CM | POA: Diagnosis not present

## 2023-05-21 DIAGNOSIS — L299 Pruritus, unspecified: Secondary | ICD-10-CM | POA: Diagnosis not present

## 2023-05-21 DIAGNOSIS — M150 Primary generalized (osteo)arthritis: Secondary | ICD-10-CM | POA: Diagnosis not present

## 2023-05-21 DIAGNOSIS — M6281 Muscle weakness (generalized): Secondary | ICD-10-CM | POA: Diagnosis not present

## 2023-05-21 DIAGNOSIS — G309 Alzheimer's disease, unspecified: Secondary | ICD-10-CM | POA: Diagnosis not present

## 2023-05-21 DIAGNOSIS — I10 Essential (primary) hypertension: Secondary | ICD-10-CM | POA: Diagnosis not present

## 2023-05-21 DIAGNOSIS — E440 Moderate protein-calorie malnutrition: Secondary | ICD-10-CM | POA: Diagnosis not present

## 2023-05-21 DIAGNOSIS — J45909 Unspecified asthma, uncomplicated: Secondary | ICD-10-CM | POA: Diagnosis not present

## 2023-05-21 DIAGNOSIS — G63 Polyneuropathy in diseases classified elsewhere: Secondary | ICD-10-CM | POA: Diagnosis not present

## 2023-05-21 DIAGNOSIS — E785 Hyperlipidemia, unspecified: Secondary | ICD-10-CM | POA: Diagnosis not present

## 2023-05-21 DIAGNOSIS — E870 Hyperosmolality and hypernatremia: Secondary | ICD-10-CM | POA: Diagnosis not present

## 2023-05-21 LAB — CULTURE, BLOOD (ROUTINE X 2): Culture: NO GROWTH

## 2023-05-23 DIAGNOSIS — L89313 Pressure ulcer of right buttock, stage 3: Secondary | ICD-10-CM | POA: Diagnosis not present

## 2023-05-23 DIAGNOSIS — R627 Adult failure to thrive: Secondary | ICD-10-CM | POA: Diagnosis not present

## 2023-05-23 DIAGNOSIS — E441 Mild protein-calorie malnutrition: Secondary | ICD-10-CM | POA: Diagnosis not present

## 2023-05-23 DIAGNOSIS — R239 Unspecified skin changes: Secondary | ICD-10-CM | POA: Diagnosis not present

## 2023-05-23 DIAGNOSIS — M6281 Muscle weakness (generalized): Secondary | ICD-10-CM | POA: Diagnosis not present

## 2023-05-25 DIAGNOSIS — J45909 Unspecified asthma, uncomplicated: Secondary | ICD-10-CM | POA: Diagnosis not present

## 2023-05-25 DIAGNOSIS — M150 Primary generalized (osteo)arthritis: Secondary | ICD-10-CM | POA: Diagnosis not present

## 2023-05-25 DIAGNOSIS — E440 Moderate protein-calorie malnutrition: Secondary | ICD-10-CM | POA: Diagnosis not present

## 2023-05-25 DIAGNOSIS — I10 Essential (primary) hypertension: Secondary | ICD-10-CM | POA: Diagnosis not present

## 2023-05-31 DIAGNOSIS — L89202 Pressure ulcer of unspecified hip, stage 2: Secondary | ICD-10-CM | POA: Diagnosis not present

## 2023-05-31 DIAGNOSIS — G8929 Other chronic pain: Secondary | ICD-10-CM | POA: Diagnosis not present

## 2023-05-31 DIAGNOSIS — I10 Essential (primary) hypertension: Secondary | ICD-10-CM | POA: Diagnosis not present

## 2023-05-31 DIAGNOSIS — E785 Hyperlipidemia, unspecified: Secondary | ICD-10-CM | POA: Diagnosis not present

## 2023-05-31 DIAGNOSIS — G300 Alzheimer's disease with early onset: Secondary | ICD-10-CM | POA: Diagnosis not present

## 2023-05-31 DIAGNOSIS — J45909 Unspecified asthma, uncomplicated: Secondary | ICD-10-CM | POA: Diagnosis not present

## 2023-06-05 DIAGNOSIS — J45909 Unspecified asthma, uncomplicated: Secondary | ICD-10-CM | POA: Diagnosis not present

## 2023-06-05 DIAGNOSIS — E440 Moderate protein-calorie malnutrition: Secondary | ICD-10-CM | POA: Diagnosis not present

## 2023-06-05 DIAGNOSIS — M150 Primary generalized (osteo)arthritis: Secondary | ICD-10-CM | POA: Diagnosis not present

## 2023-06-05 DIAGNOSIS — I10 Essential (primary) hypertension: Secondary | ICD-10-CM | POA: Diagnosis not present

## 2023-06-06 DIAGNOSIS — I959 Hypotension, unspecified: Secondary | ICD-10-CM | POA: Diagnosis not present

## 2023-06-06 DIAGNOSIS — E876 Hypokalemia: Secondary | ICD-10-CM | POA: Diagnosis not present

## 2023-06-07 DIAGNOSIS — E440 Moderate protein-calorie malnutrition: Secondary | ICD-10-CM | POA: Diagnosis not present

## 2023-06-07 DIAGNOSIS — I10 Essential (primary) hypertension: Secondary | ICD-10-CM | POA: Diagnosis not present

## 2023-06-07 DIAGNOSIS — J45909 Unspecified asthma, uncomplicated: Secondary | ICD-10-CM | POA: Diagnosis not present

## 2023-06-07 DIAGNOSIS — E870 Hyperosmolality and hypernatremia: Secondary | ICD-10-CM | POA: Diagnosis not present

## 2023-06-07 DIAGNOSIS — G309 Alzheimer's disease, unspecified: Secondary | ICD-10-CM | POA: Diagnosis not present

## 2023-06-14 DIAGNOSIS — M6281 Muscle weakness (generalized): Secondary | ICD-10-CM | POA: Diagnosis not present

## 2023-06-14 DIAGNOSIS — E441 Mild protein-calorie malnutrition: Secondary | ICD-10-CM | POA: Diagnosis not present

## 2023-06-14 DIAGNOSIS — R239 Unspecified skin changes: Secondary | ICD-10-CM | POA: Diagnosis not present

## 2023-06-14 DIAGNOSIS — R627 Adult failure to thrive: Secondary | ICD-10-CM | POA: Diagnosis not present

## 2023-06-14 DIAGNOSIS — M150 Primary generalized (osteo)arthritis: Secondary | ICD-10-CM | POA: Diagnosis not present

## 2023-06-14 DIAGNOSIS — I10 Essential (primary) hypertension: Secondary | ICD-10-CM | POA: Diagnosis not present

## 2023-06-14 DIAGNOSIS — E440 Moderate protein-calorie malnutrition: Secondary | ICD-10-CM | POA: Diagnosis not present

## 2023-06-14 DIAGNOSIS — L89310 Pressure ulcer of right buttock, unstageable: Secondary | ICD-10-CM | POA: Diagnosis not present

## 2023-06-14 DIAGNOSIS — J45909 Unspecified asthma, uncomplicated: Secondary | ICD-10-CM | POA: Diagnosis not present

## 2023-06-19 DIAGNOSIS — J45909 Unspecified asthma, uncomplicated: Secondary | ICD-10-CM | POA: Diagnosis not present

## 2023-06-19 DIAGNOSIS — E440 Moderate protein-calorie malnutrition: Secondary | ICD-10-CM | POA: Diagnosis not present

## 2023-06-19 DIAGNOSIS — G63 Polyneuropathy in diseases classified elsewhere: Secondary | ICD-10-CM | POA: Diagnosis not present

## 2023-06-20 DIAGNOSIS — L89310 Pressure ulcer of right buttock, unstageable: Secondary | ICD-10-CM | POA: Diagnosis not present

## 2023-06-20 DIAGNOSIS — R627 Adult failure to thrive: Secondary | ICD-10-CM | POA: Diagnosis not present

## 2023-06-20 DIAGNOSIS — E441 Mild protein-calorie malnutrition: Secondary | ICD-10-CM | POA: Diagnosis not present

## 2023-06-20 DIAGNOSIS — R239 Unspecified skin changes: Secondary | ICD-10-CM | POA: Diagnosis not present

## 2023-06-20 DIAGNOSIS — M6281 Muscle weakness (generalized): Secondary | ICD-10-CM | POA: Diagnosis not present

## 2023-06-21 DIAGNOSIS — G300 Alzheimer's disease with early onset: Secondary | ICD-10-CM | POA: Diagnosis not present

## 2023-06-21 DIAGNOSIS — E440 Moderate protein-calorie malnutrition: Secondary | ICD-10-CM | POA: Diagnosis not present

## 2023-06-21 DIAGNOSIS — R5381 Other malaise: Secondary | ICD-10-CM | POA: Diagnosis not present

## 2023-06-25 DIAGNOSIS — J45909 Unspecified asthma, uncomplicated: Secondary | ICD-10-CM | POA: Diagnosis not present

## 2023-06-25 DIAGNOSIS — G63 Polyneuropathy in diseases classified elsewhere: Secondary | ICD-10-CM | POA: Diagnosis not present

## 2023-06-25 DIAGNOSIS — E440 Moderate protein-calorie malnutrition: Secondary | ICD-10-CM | POA: Diagnosis not present

## 2023-06-26 DIAGNOSIS — G309 Alzheimer's disease, unspecified: Secondary | ICD-10-CM | POA: Diagnosis not present

## 2023-06-26 DIAGNOSIS — J45909 Unspecified asthma, uncomplicated: Secondary | ICD-10-CM | POA: Diagnosis not present

## 2023-06-26 DIAGNOSIS — G300 Alzheimer's disease with early onset: Secondary | ICD-10-CM | POA: Diagnosis not present

## 2023-06-26 DIAGNOSIS — G8929 Other chronic pain: Secondary | ICD-10-CM | POA: Diagnosis not present

## 2023-06-26 DIAGNOSIS — L89202 Pressure ulcer of unspecified hip, stage 2: Secondary | ICD-10-CM | POA: Diagnosis not present

## 2023-06-26 DIAGNOSIS — I10 Essential (primary) hypertension: Secondary | ICD-10-CM | POA: Diagnosis not present

## 2023-06-26 DIAGNOSIS — E785 Hyperlipidemia, unspecified: Secondary | ICD-10-CM | POA: Diagnosis not present

## 2023-06-28 DIAGNOSIS — R239 Unspecified skin changes: Secondary | ICD-10-CM | POA: Diagnosis not present

## 2023-06-28 DIAGNOSIS — R627 Adult failure to thrive: Secondary | ICD-10-CM | POA: Diagnosis not present

## 2023-06-28 DIAGNOSIS — E441 Mild protein-calorie malnutrition: Secondary | ICD-10-CM | POA: Diagnosis not present

## 2023-06-28 DIAGNOSIS — M6281 Muscle weakness (generalized): Secondary | ICD-10-CM | POA: Diagnosis not present

## 2023-06-28 DIAGNOSIS — L89313 Pressure ulcer of right buttock, stage 3: Secondary | ICD-10-CM | POA: Diagnosis not present

## 2023-07-04 DIAGNOSIS — R627 Adult failure to thrive: Secondary | ICD-10-CM | POA: Diagnosis not present

## 2023-07-04 DIAGNOSIS — E441 Mild protein-calorie malnutrition: Secondary | ICD-10-CM | POA: Diagnosis not present

## 2023-07-04 DIAGNOSIS — L89313 Pressure ulcer of right buttock, stage 3: Secondary | ICD-10-CM | POA: Diagnosis not present

## 2023-07-04 DIAGNOSIS — R239 Unspecified skin changes: Secondary | ICD-10-CM | POA: Diagnosis not present

## 2023-07-04 DIAGNOSIS — M6281 Muscle weakness (generalized): Secondary | ICD-10-CM | POA: Diagnosis not present

## 2023-07-06 DIAGNOSIS — L89313 Pressure ulcer of right buttock, stage 3: Secondary | ICD-10-CM | POA: Diagnosis not present

## 2023-07-11 DIAGNOSIS — M6281 Muscle weakness (generalized): Secondary | ICD-10-CM | POA: Diagnosis not present

## 2023-07-11 DIAGNOSIS — L89313 Pressure ulcer of right buttock, stage 3: Secondary | ICD-10-CM | POA: Diagnosis not present

## 2023-07-11 DIAGNOSIS — R627 Adult failure to thrive: Secondary | ICD-10-CM | POA: Diagnosis not present

## 2023-07-11 DIAGNOSIS — R239 Unspecified skin changes: Secondary | ICD-10-CM | POA: Diagnosis not present

## 2023-07-11 DIAGNOSIS — E441 Mild protein-calorie malnutrition: Secondary | ICD-10-CM | POA: Diagnosis not present

## 2023-07-13 DIAGNOSIS — I69991 Dysphagia following unspecified cerebrovascular disease: Secondary | ICD-10-CM | POA: Diagnosis not present

## 2023-07-13 DIAGNOSIS — I10 Essential (primary) hypertension: Secondary | ICD-10-CM | POA: Diagnosis not present

## 2023-07-13 DIAGNOSIS — J45909 Unspecified asthma, uncomplicated: Secondary | ICD-10-CM | POA: Diagnosis not present

## 2023-07-13 DIAGNOSIS — I69919 Unspecified symptoms and signs involving cognitive functions following unspecified cerebrovascular disease: Secondary | ICD-10-CM | POA: Diagnosis not present

## 2023-07-13 DIAGNOSIS — E440 Moderate protein-calorie malnutrition: Secondary | ICD-10-CM | POA: Diagnosis not present

## 2023-07-13 DIAGNOSIS — G9341 Metabolic encephalopathy: Secondary | ICD-10-CM | POA: Diagnosis not present

## 2023-07-13 DIAGNOSIS — G309 Alzheimer's disease, unspecified: Secondary | ICD-10-CM | POA: Diagnosis not present

## 2023-07-17 DIAGNOSIS — I69991 Dysphagia following unspecified cerebrovascular disease: Secondary | ICD-10-CM | POA: Diagnosis not present

## 2023-07-17 DIAGNOSIS — G9341 Metabolic encephalopathy: Secondary | ICD-10-CM | POA: Diagnosis not present

## 2023-07-17 DIAGNOSIS — I69919 Unspecified symptoms and signs involving cognitive functions following unspecified cerebrovascular disease: Secondary | ICD-10-CM | POA: Diagnosis not present

## 2023-07-17 DIAGNOSIS — G309 Alzheimer's disease, unspecified: Secondary | ICD-10-CM | POA: Diagnosis not present

## 2023-07-18 DIAGNOSIS — J45909 Unspecified asthma, uncomplicated: Secondary | ICD-10-CM | POA: Diagnosis not present

## 2023-07-18 DIAGNOSIS — R239 Unspecified skin changes: Secondary | ICD-10-CM | POA: Diagnosis not present

## 2023-07-18 DIAGNOSIS — I10 Essential (primary) hypertension: Secondary | ICD-10-CM | POA: Diagnosis not present

## 2023-07-18 DIAGNOSIS — I69991 Dysphagia following unspecified cerebrovascular disease: Secondary | ICD-10-CM | POA: Diagnosis not present

## 2023-07-18 DIAGNOSIS — M6281 Muscle weakness (generalized): Secondary | ICD-10-CM | POA: Diagnosis not present

## 2023-07-18 DIAGNOSIS — E785 Hyperlipidemia, unspecified: Secondary | ICD-10-CM | POA: Diagnosis not present

## 2023-07-18 DIAGNOSIS — L89313 Pressure ulcer of right buttock, stage 3: Secondary | ICD-10-CM | POA: Diagnosis not present

## 2023-07-18 DIAGNOSIS — E441 Mild protein-calorie malnutrition: Secondary | ICD-10-CM | POA: Diagnosis not present

## 2023-07-18 DIAGNOSIS — G300 Alzheimer's disease with early onset: Secondary | ICD-10-CM | POA: Diagnosis not present

## 2023-07-18 DIAGNOSIS — G9341 Metabolic encephalopathy: Secondary | ICD-10-CM | POA: Diagnosis not present

## 2023-07-18 DIAGNOSIS — R627 Adult failure to thrive: Secondary | ICD-10-CM | POA: Diagnosis not present

## 2023-07-18 DIAGNOSIS — G309 Alzheimer's disease, unspecified: Secondary | ICD-10-CM | POA: Diagnosis not present

## 2023-07-18 DIAGNOSIS — I69919 Unspecified symptoms and signs involving cognitive functions following unspecified cerebrovascular disease: Secondary | ICD-10-CM | POA: Diagnosis not present

## 2023-07-18 DIAGNOSIS — G8929 Other chronic pain: Secondary | ICD-10-CM | POA: Diagnosis not present

## 2023-07-24 DIAGNOSIS — E440 Moderate protein-calorie malnutrition: Secondary | ICD-10-CM | POA: Diagnosis not present

## 2023-07-24 DIAGNOSIS — G309 Alzheimer's disease, unspecified: Secondary | ICD-10-CM | POA: Diagnosis not present

## 2023-07-24 DIAGNOSIS — I69919 Unspecified symptoms and signs involving cognitive functions following unspecified cerebrovascular disease: Secondary | ICD-10-CM | POA: Diagnosis not present

## 2023-07-24 DIAGNOSIS — R5381 Other malaise: Secondary | ICD-10-CM | POA: Diagnosis not present

## 2023-07-24 DIAGNOSIS — E871 Hypo-osmolality and hyponatremia: Secondary | ICD-10-CM | POA: Diagnosis not present

## 2023-07-24 DIAGNOSIS — G9341 Metabolic encephalopathy: Secondary | ICD-10-CM | POA: Diagnosis not present

## 2023-07-24 DIAGNOSIS — M6281 Muscle weakness (generalized): Secondary | ICD-10-CM | POA: Diagnosis not present

## 2023-07-24 DIAGNOSIS — J4520 Mild intermittent asthma, uncomplicated: Secondary | ICD-10-CM | POA: Diagnosis not present

## 2023-07-24 DIAGNOSIS — E785 Hyperlipidemia, unspecified: Secondary | ICD-10-CM | POA: Diagnosis not present

## 2023-07-24 DIAGNOSIS — I69991 Dysphagia following unspecified cerebrovascular disease: Secondary | ICD-10-CM | POA: Diagnosis not present

## 2023-07-24 DIAGNOSIS — E876 Hypokalemia: Secondary | ICD-10-CM | POA: Diagnosis not present

## 2023-07-25 DIAGNOSIS — R239 Unspecified skin changes: Secondary | ICD-10-CM | POA: Diagnosis not present

## 2023-07-25 DIAGNOSIS — R627 Adult failure to thrive: Secondary | ICD-10-CM | POA: Diagnosis not present

## 2023-07-25 DIAGNOSIS — I69919 Unspecified symptoms and signs involving cognitive functions following unspecified cerebrovascular disease: Secondary | ICD-10-CM | POA: Diagnosis not present

## 2023-07-25 DIAGNOSIS — E441 Mild protein-calorie malnutrition: Secondary | ICD-10-CM | POA: Diagnosis not present

## 2023-07-25 DIAGNOSIS — M6281 Muscle weakness (generalized): Secondary | ICD-10-CM | POA: Diagnosis not present

## 2023-07-25 DIAGNOSIS — I69991 Dysphagia following unspecified cerebrovascular disease: Secondary | ICD-10-CM | POA: Diagnosis not present

## 2023-07-25 DIAGNOSIS — G9341 Metabolic encephalopathy: Secondary | ICD-10-CM | POA: Diagnosis not present

## 2023-07-25 DIAGNOSIS — G309 Alzheimer's disease, unspecified: Secondary | ICD-10-CM | POA: Diagnosis not present

## 2023-07-25 DIAGNOSIS — L89313 Pressure ulcer of right buttock, stage 3: Secondary | ICD-10-CM | POA: Diagnosis not present

## 2023-07-26 DIAGNOSIS — E440 Moderate protein-calorie malnutrition: Secondary | ICD-10-CM | POA: Diagnosis not present

## 2023-07-26 DIAGNOSIS — I69991 Dysphagia following unspecified cerebrovascular disease: Secondary | ICD-10-CM | POA: Diagnosis not present

## 2023-07-26 DIAGNOSIS — G9341 Metabolic encephalopathy: Secondary | ICD-10-CM | POA: Diagnosis not present

## 2023-07-26 DIAGNOSIS — G300 Alzheimer's disease with early onset: Secondary | ICD-10-CM | POA: Diagnosis not present

## 2023-07-26 DIAGNOSIS — I69919 Unspecified symptoms and signs involving cognitive functions following unspecified cerebrovascular disease: Secondary | ICD-10-CM | POA: Diagnosis not present

## 2023-07-26 DIAGNOSIS — R5381 Other malaise: Secondary | ICD-10-CM | POA: Diagnosis not present

## 2023-07-26 DIAGNOSIS — G309 Alzheimer's disease, unspecified: Secondary | ICD-10-CM | POA: Diagnosis not present

## 2023-07-31 DIAGNOSIS — G9341 Metabolic encephalopathy: Secondary | ICD-10-CM | POA: Diagnosis not present

## 2023-07-31 DIAGNOSIS — I69919 Unspecified symptoms and signs involving cognitive functions following unspecified cerebrovascular disease: Secondary | ICD-10-CM | POA: Diagnosis not present

## 2023-07-31 DIAGNOSIS — I69991 Dysphagia following unspecified cerebrovascular disease: Secondary | ICD-10-CM | POA: Diagnosis not present

## 2023-07-31 DIAGNOSIS — G309 Alzheimer's disease, unspecified: Secondary | ICD-10-CM | POA: Diagnosis not present

## 2023-08-01 DIAGNOSIS — G9341 Metabolic encephalopathy: Secondary | ICD-10-CM | POA: Diagnosis not present

## 2023-08-01 DIAGNOSIS — I69991 Dysphagia following unspecified cerebrovascular disease: Secondary | ICD-10-CM | POA: Diagnosis not present

## 2023-08-01 DIAGNOSIS — M6281 Muscle weakness (generalized): Secondary | ICD-10-CM | POA: Diagnosis not present

## 2023-08-01 DIAGNOSIS — R239 Unspecified skin changes: Secondary | ICD-10-CM | POA: Diagnosis not present

## 2023-08-01 DIAGNOSIS — E441 Mild protein-calorie malnutrition: Secondary | ICD-10-CM | POA: Diagnosis not present

## 2023-08-01 DIAGNOSIS — G309 Alzheimer's disease, unspecified: Secondary | ICD-10-CM | POA: Diagnosis not present

## 2023-08-01 DIAGNOSIS — L89313 Pressure ulcer of right buttock, stage 3: Secondary | ICD-10-CM | POA: Diagnosis not present

## 2023-08-01 DIAGNOSIS — I69919 Unspecified symptoms and signs involving cognitive functions following unspecified cerebrovascular disease: Secondary | ICD-10-CM | POA: Diagnosis not present

## 2023-08-01 DIAGNOSIS — R627 Adult failure to thrive: Secondary | ICD-10-CM | POA: Diagnosis not present

## 2023-08-02 DIAGNOSIS — G309 Alzheimer's disease, unspecified: Secondary | ICD-10-CM | POA: Diagnosis not present

## 2023-08-02 DIAGNOSIS — G9341 Metabolic encephalopathy: Secondary | ICD-10-CM | POA: Diagnosis not present

## 2023-08-02 DIAGNOSIS — I69919 Unspecified symptoms and signs involving cognitive functions following unspecified cerebrovascular disease: Secondary | ICD-10-CM | POA: Diagnosis not present

## 2023-08-02 DIAGNOSIS — I69991 Dysphagia following unspecified cerebrovascular disease: Secondary | ICD-10-CM | POA: Diagnosis not present

## 2023-08-03 DIAGNOSIS — G629 Polyneuropathy, unspecified: Secondary | ICD-10-CM | POA: Diagnosis not present

## 2023-08-03 DIAGNOSIS — L89313 Pressure ulcer of right buttock, stage 3: Secondary | ICD-10-CM | POA: Diagnosis not present

## 2023-08-03 DIAGNOSIS — L299 Pruritus, unspecified: Secondary | ICD-10-CM | POA: Diagnosis not present

## 2023-08-03 DIAGNOSIS — J4520 Mild intermittent asthma, uncomplicated: Secondary | ICD-10-CM | POA: Diagnosis not present

## 2023-08-03 DIAGNOSIS — E440 Moderate protein-calorie malnutrition: Secondary | ICD-10-CM | POA: Diagnosis not present

## 2023-08-07 DIAGNOSIS — G9341 Metabolic encephalopathy: Secondary | ICD-10-CM | POA: Diagnosis not present

## 2023-08-07 DIAGNOSIS — G309 Alzheimer's disease, unspecified: Secondary | ICD-10-CM | POA: Diagnosis not present

## 2023-08-07 DIAGNOSIS — I69991 Dysphagia following unspecified cerebrovascular disease: Secondary | ICD-10-CM | POA: Diagnosis not present

## 2023-08-07 DIAGNOSIS — I69919 Unspecified symptoms and signs involving cognitive functions following unspecified cerebrovascular disease: Secondary | ICD-10-CM | POA: Diagnosis not present

## 2023-08-08 DIAGNOSIS — L89313 Pressure ulcer of right buttock, stage 3: Secondary | ICD-10-CM | POA: Diagnosis not present

## 2023-08-08 DIAGNOSIS — E441 Mild protein-calorie malnutrition: Secondary | ICD-10-CM | POA: Diagnosis not present

## 2023-08-08 DIAGNOSIS — G9341 Metabolic encephalopathy: Secondary | ICD-10-CM | POA: Diagnosis not present

## 2023-08-08 DIAGNOSIS — R627 Adult failure to thrive: Secondary | ICD-10-CM | POA: Diagnosis not present

## 2023-08-08 DIAGNOSIS — M6281 Muscle weakness (generalized): Secondary | ICD-10-CM | POA: Diagnosis not present

## 2023-08-08 DIAGNOSIS — G309 Alzheimer's disease, unspecified: Secondary | ICD-10-CM | POA: Diagnosis not present

## 2023-08-08 DIAGNOSIS — I69919 Unspecified symptoms and signs involving cognitive functions following unspecified cerebrovascular disease: Secondary | ICD-10-CM | POA: Diagnosis not present

## 2023-08-08 DIAGNOSIS — I69991 Dysphagia following unspecified cerebrovascular disease: Secondary | ICD-10-CM | POA: Diagnosis not present

## 2023-08-08 DIAGNOSIS — R239 Unspecified skin changes: Secondary | ICD-10-CM | POA: Diagnosis not present

## 2023-08-09 DIAGNOSIS — G9341 Metabolic encephalopathy: Secondary | ICD-10-CM | POA: Diagnosis not present

## 2023-08-09 DIAGNOSIS — I69919 Unspecified symptoms and signs involving cognitive functions following unspecified cerebrovascular disease: Secondary | ICD-10-CM | POA: Diagnosis not present

## 2023-08-09 DIAGNOSIS — G309 Alzheimer's disease, unspecified: Secondary | ICD-10-CM | POA: Diagnosis not present

## 2023-08-09 DIAGNOSIS — I69991 Dysphagia following unspecified cerebrovascular disease: Secondary | ICD-10-CM | POA: Diagnosis not present

## 2023-08-10 DIAGNOSIS — I69991 Dysphagia following unspecified cerebrovascular disease: Secondary | ICD-10-CM | POA: Diagnosis not present

## 2023-08-10 DIAGNOSIS — I69919 Unspecified symptoms and signs involving cognitive functions following unspecified cerebrovascular disease: Secondary | ICD-10-CM | POA: Diagnosis not present

## 2023-08-10 DIAGNOSIS — G309 Alzheimer's disease, unspecified: Secondary | ICD-10-CM | POA: Diagnosis not present

## 2023-08-10 DIAGNOSIS — G9341 Metabolic encephalopathy: Secondary | ICD-10-CM | POA: Diagnosis not present

## 2023-08-13 DIAGNOSIS — I69991 Dysphagia following unspecified cerebrovascular disease: Secondary | ICD-10-CM | POA: Diagnosis not present

## 2023-08-13 DIAGNOSIS — G9341 Metabolic encephalopathy: Secondary | ICD-10-CM | POA: Diagnosis not present

## 2023-08-13 DIAGNOSIS — G309 Alzheimer's disease, unspecified: Secondary | ICD-10-CM | POA: Diagnosis not present

## 2023-08-13 DIAGNOSIS — I69919 Unspecified symptoms and signs involving cognitive functions following unspecified cerebrovascular disease: Secondary | ICD-10-CM | POA: Diagnosis not present

## 2023-08-16 DIAGNOSIS — E440 Moderate protein-calorie malnutrition: Secondary | ICD-10-CM | POA: Diagnosis not present

## 2023-08-16 DIAGNOSIS — G629 Polyneuropathy, unspecified: Secondary | ICD-10-CM | POA: Diagnosis not present

## 2023-08-16 DIAGNOSIS — J4520 Mild intermittent asthma, uncomplicated: Secondary | ICD-10-CM | POA: Diagnosis not present

## 2023-08-16 DIAGNOSIS — L299 Pruritus, unspecified: Secondary | ICD-10-CM | POA: Diagnosis not present

## 2023-08-21 DIAGNOSIS — E785 Hyperlipidemia, unspecified: Secondary | ICD-10-CM | POA: Diagnosis not present

## 2023-08-21 DIAGNOSIS — E876 Hypokalemia: Secondary | ICD-10-CM | POA: Diagnosis not present

## 2023-08-21 DIAGNOSIS — E559 Vitamin D deficiency, unspecified: Secondary | ICD-10-CM | POA: Diagnosis not present

## 2023-08-21 DIAGNOSIS — E119 Type 2 diabetes mellitus without complications: Secondary | ICD-10-CM | POA: Diagnosis not present

## 2023-08-22 DIAGNOSIS — G629 Polyneuropathy, unspecified: Secondary | ICD-10-CM | POA: Diagnosis not present

## 2023-08-22 DIAGNOSIS — E440 Moderate protein-calorie malnutrition: Secondary | ICD-10-CM | POA: Diagnosis not present

## 2023-08-22 DIAGNOSIS — J4520 Mild intermittent asthma, uncomplicated: Secondary | ICD-10-CM | POA: Diagnosis not present

## 2023-08-22 DIAGNOSIS — L299 Pruritus, unspecified: Secondary | ICD-10-CM | POA: Diagnosis not present

## 2023-08-23 DIAGNOSIS — G300 Alzheimer's disease with early onset: Secondary | ICD-10-CM | POA: Diagnosis not present

## 2023-08-23 DIAGNOSIS — R5381 Other malaise: Secondary | ICD-10-CM | POA: Diagnosis not present

## 2023-08-23 DIAGNOSIS — E440 Moderate protein-calorie malnutrition: Secondary | ICD-10-CM | POA: Diagnosis not present

## 2023-09-07 DIAGNOSIS — E440 Moderate protein-calorie malnutrition: Secondary | ICD-10-CM | POA: Diagnosis not present

## 2023-09-07 DIAGNOSIS — G629 Polyneuropathy, unspecified: Secondary | ICD-10-CM | POA: Diagnosis not present

## 2023-09-07 DIAGNOSIS — J4520 Mild intermittent asthma, uncomplicated: Secondary | ICD-10-CM | POA: Diagnosis not present

## 2023-09-10 DIAGNOSIS — I10 Essential (primary) hypertension: Secondary | ICD-10-CM | POA: Diagnosis not present

## 2023-09-10 DIAGNOSIS — G300 Alzheimer's disease with early onset: Secondary | ICD-10-CM | POA: Diagnosis not present

## 2023-09-10 DIAGNOSIS — J45909 Unspecified asthma, uncomplicated: Secondary | ICD-10-CM | POA: Diagnosis not present

## 2023-09-10 DIAGNOSIS — E785 Hyperlipidemia, unspecified: Secondary | ICD-10-CM | POA: Diagnosis not present

## 2023-09-13 DIAGNOSIS — E440 Moderate protein-calorie malnutrition: Secondary | ICD-10-CM | POA: Diagnosis not present

## 2023-09-13 DIAGNOSIS — J4520 Mild intermittent asthma, uncomplicated: Secondary | ICD-10-CM | POA: Diagnosis not present

## 2023-09-13 DIAGNOSIS — G629 Polyneuropathy, unspecified: Secondary | ICD-10-CM | POA: Diagnosis not present

## 2023-09-25 DIAGNOSIS — R1313 Dysphagia, pharyngeal phase: Secondary | ICD-10-CM | POA: Diagnosis not present

## 2023-09-25 DIAGNOSIS — G309 Alzheimer's disease, unspecified: Secondary | ICD-10-CM | POA: Diagnosis not present

## 2023-09-25 DIAGNOSIS — M2560 Stiffness of unspecified joint, not elsewhere classified: Secondary | ICD-10-CM | POA: Diagnosis not present

## 2023-09-25 DIAGNOSIS — M2450 Contracture, unspecified joint: Secondary | ICD-10-CM | POA: Diagnosis not present

## 2023-09-25 DIAGNOSIS — R293 Abnormal posture: Secondary | ICD-10-CM | POA: Diagnosis not present

## 2023-09-26 DIAGNOSIS — M2560 Stiffness of unspecified joint, not elsewhere classified: Secondary | ICD-10-CM | POA: Diagnosis not present

## 2023-09-26 DIAGNOSIS — R293 Abnormal posture: Secondary | ICD-10-CM | POA: Diagnosis not present

## 2023-09-26 DIAGNOSIS — G309 Alzheimer's disease, unspecified: Secondary | ICD-10-CM | POA: Diagnosis not present

## 2023-09-26 DIAGNOSIS — M2450 Contracture, unspecified joint: Secondary | ICD-10-CM | POA: Diagnosis not present

## 2023-09-26 DIAGNOSIS — R1313 Dysphagia, pharyngeal phase: Secondary | ICD-10-CM | POA: Diagnosis not present

## 2023-09-27 DIAGNOSIS — R5381 Other malaise: Secondary | ICD-10-CM | POA: Diagnosis not present

## 2023-09-27 DIAGNOSIS — E440 Moderate protein-calorie malnutrition: Secondary | ICD-10-CM | POA: Diagnosis not present

## 2023-09-27 DIAGNOSIS — G300 Alzheimer's disease with early onset: Secondary | ICD-10-CM | POA: Diagnosis not present

## 2023-09-28 DIAGNOSIS — M2450 Contracture, unspecified joint: Secondary | ICD-10-CM | POA: Diagnosis not present

## 2023-09-28 DIAGNOSIS — M2560 Stiffness of unspecified joint, not elsewhere classified: Secondary | ICD-10-CM | POA: Diagnosis not present

## 2023-09-28 DIAGNOSIS — R293 Abnormal posture: Secondary | ICD-10-CM | POA: Diagnosis not present

## 2023-09-28 DIAGNOSIS — R1313 Dysphagia, pharyngeal phase: Secondary | ICD-10-CM | POA: Diagnosis not present

## 2023-09-28 DIAGNOSIS — G309 Alzheimer's disease, unspecified: Secondary | ICD-10-CM | POA: Diagnosis not present

## 2023-10-02 DIAGNOSIS — R293 Abnormal posture: Secondary | ICD-10-CM | POA: Diagnosis not present

## 2023-10-02 DIAGNOSIS — R1313 Dysphagia, pharyngeal phase: Secondary | ICD-10-CM | POA: Diagnosis not present

## 2023-10-02 DIAGNOSIS — M2560 Stiffness of unspecified joint, not elsewhere classified: Secondary | ICD-10-CM | POA: Diagnosis not present

## 2023-10-02 DIAGNOSIS — M2450 Contracture, unspecified joint: Secondary | ICD-10-CM | POA: Diagnosis not present

## 2023-10-02 DIAGNOSIS — G309 Alzheimer's disease, unspecified: Secondary | ICD-10-CM | POA: Diagnosis not present

## 2023-10-03 DIAGNOSIS — G309 Alzheimer's disease, unspecified: Secondary | ICD-10-CM | POA: Diagnosis not present

## 2023-10-03 DIAGNOSIS — M2560 Stiffness of unspecified joint, not elsewhere classified: Secondary | ICD-10-CM | POA: Diagnosis not present

## 2023-10-03 DIAGNOSIS — M2450 Contracture, unspecified joint: Secondary | ICD-10-CM | POA: Diagnosis not present

## 2023-10-03 DIAGNOSIS — R293 Abnormal posture: Secondary | ICD-10-CM | POA: Diagnosis not present

## 2023-10-04 DIAGNOSIS — M2560 Stiffness of unspecified joint, not elsewhere classified: Secondary | ICD-10-CM | POA: Diagnosis not present

## 2023-10-04 DIAGNOSIS — G629 Polyneuropathy, unspecified: Secondary | ICD-10-CM | POA: Diagnosis not present

## 2023-10-04 DIAGNOSIS — R293 Abnormal posture: Secondary | ICD-10-CM | POA: Diagnosis not present

## 2023-10-04 DIAGNOSIS — J4520 Mild intermittent asthma, uncomplicated: Secondary | ICD-10-CM | POA: Diagnosis not present

## 2023-10-04 DIAGNOSIS — E440 Moderate protein-calorie malnutrition: Secondary | ICD-10-CM | POA: Diagnosis not present

## 2023-10-04 DIAGNOSIS — M2450 Contracture, unspecified joint: Secondary | ICD-10-CM | POA: Diagnosis not present

## 2023-10-04 DIAGNOSIS — G309 Alzheimer's disease, unspecified: Secondary | ICD-10-CM | POA: Diagnosis not present

## 2023-10-05 DIAGNOSIS — M2560 Stiffness of unspecified joint, not elsewhere classified: Secondary | ICD-10-CM | POA: Diagnosis not present

## 2023-10-05 DIAGNOSIS — G309 Alzheimer's disease, unspecified: Secondary | ICD-10-CM | POA: Diagnosis not present

## 2023-10-05 DIAGNOSIS — M2450 Contracture, unspecified joint: Secondary | ICD-10-CM | POA: Diagnosis not present

## 2023-10-05 DIAGNOSIS — R293 Abnormal posture: Secondary | ICD-10-CM | POA: Diagnosis not present

## 2023-10-06 DIAGNOSIS — G309 Alzheimer's disease, unspecified: Secondary | ICD-10-CM | POA: Diagnosis not present

## 2023-10-06 DIAGNOSIS — M2560 Stiffness of unspecified joint, not elsewhere classified: Secondary | ICD-10-CM | POA: Diagnosis not present

## 2023-10-06 DIAGNOSIS — R293 Abnormal posture: Secondary | ICD-10-CM | POA: Diagnosis not present

## 2023-10-06 DIAGNOSIS — M2450 Contracture, unspecified joint: Secondary | ICD-10-CM | POA: Diagnosis not present

## 2023-10-07 DIAGNOSIS — R293 Abnormal posture: Secondary | ICD-10-CM | POA: Diagnosis not present

## 2023-10-07 DIAGNOSIS — G309 Alzheimer's disease, unspecified: Secondary | ICD-10-CM | POA: Diagnosis not present

## 2023-10-07 DIAGNOSIS — M2450 Contracture, unspecified joint: Secondary | ICD-10-CM | POA: Diagnosis not present

## 2023-10-07 DIAGNOSIS — M2560 Stiffness of unspecified joint, not elsewhere classified: Secondary | ICD-10-CM | POA: Diagnosis not present

## 2023-10-08 DIAGNOSIS — M2450 Contracture, unspecified joint: Secondary | ICD-10-CM | POA: Diagnosis not present

## 2023-10-08 DIAGNOSIS — M2560 Stiffness of unspecified joint, not elsewhere classified: Secondary | ICD-10-CM | POA: Diagnosis not present

## 2023-10-08 DIAGNOSIS — G309 Alzheimer's disease, unspecified: Secondary | ICD-10-CM | POA: Diagnosis not present

## 2023-10-08 DIAGNOSIS — R293 Abnormal posture: Secondary | ICD-10-CM | POA: Diagnosis not present

## 2023-10-09 DIAGNOSIS — R1313 Dysphagia, pharyngeal phase: Secondary | ICD-10-CM | POA: Diagnosis not present

## 2023-10-09 DIAGNOSIS — M2450 Contracture, unspecified joint: Secondary | ICD-10-CM | POA: Diagnosis not present

## 2023-10-09 DIAGNOSIS — G309 Alzheimer's disease, unspecified: Secondary | ICD-10-CM | POA: Diagnosis not present

## 2023-10-09 DIAGNOSIS — R293 Abnormal posture: Secondary | ICD-10-CM | POA: Diagnosis not present

## 2023-10-09 DIAGNOSIS — M2560 Stiffness of unspecified joint, not elsewhere classified: Secondary | ICD-10-CM | POA: Diagnosis not present

## 2023-10-11 DIAGNOSIS — R1313 Dysphagia, pharyngeal phase: Secondary | ICD-10-CM | POA: Diagnosis not present

## 2023-10-11 DIAGNOSIS — J45909 Unspecified asthma, uncomplicated: Secondary | ICD-10-CM | POA: Diagnosis not present

## 2023-10-11 DIAGNOSIS — R293 Abnormal posture: Secondary | ICD-10-CM | POA: Diagnosis not present

## 2023-10-11 DIAGNOSIS — I10 Essential (primary) hypertension: Secondary | ICD-10-CM | POA: Diagnosis not present

## 2023-10-11 DIAGNOSIS — M2450 Contracture, unspecified joint: Secondary | ICD-10-CM | POA: Diagnosis not present

## 2023-10-11 DIAGNOSIS — G309 Alzheimer's disease, unspecified: Secondary | ICD-10-CM | POA: Diagnosis not present

## 2023-10-11 DIAGNOSIS — E785 Hyperlipidemia, unspecified: Secondary | ICD-10-CM | POA: Diagnosis not present

## 2023-10-11 DIAGNOSIS — M2560 Stiffness of unspecified joint, not elsewhere classified: Secondary | ICD-10-CM | POA: Diagnosis not present

## 2023-10-11 DIAGNOSIS — G300 Alzheimer's disease with early onset: Secondary | ICD-10-CM | POA: Diagnosis not present

## 2023-10-12 DIAGNOSIS — M2560 Stiffness of unspecified joint, not elsewhere classified: Secondary | ICD-10-CM | POA: Diagnosis not present

## 2023-10-12 DIAGNOSIS — G309 Alzheimer's disease, unspecified: Secondary | ICD-10-CM | POA: Diagnosis not present

## 2023-10-12 DIAGNOSIS — G629 Polyneuropathy, unspecified: Secondary | ICD-10-CM | POA: Diagnosis not present

## 2023-10-12 DIAGNOSIS — J4520 Mild intermittent asthma, uncomplicated: Secondary | ICD-10-CM | POA: Diagnosis not present

## 2023-10-12 DIAGNOSIS — M2450 Contracture, unspecified joint: Secondary | ICD-10-CM | POA: Diagnosis not present

## 2023-10-12 DIAGNOSIS — R293 Abnormal posture: Secondary | ICD-10-CM | POA: Diagnosis not present

## 2023-10-12 DIAGNOSIS — E440 Moderate protein-calorie malnutrition: Secondary | ICD-10-CM | POA: Diagnosis not present

## 2023-10-13 DIAGNOSIS — R293 Abnormal posture: Secondary | ICD-10-CM | POA: Diagnosis not present

## 2023-10-13 DIAGNOSIS — M2450 Contracture, unspecified joint: Secondary | ICD-10-CM | POA: Diagnosis not present

## 2023-10-13 DIAGNOSIS — M2560 Stiffness of unspecified joint, not elsewhere classified: Secondary | ICD-10-CM | POA: Diagnosis not present

## 2023-10-13 DIAGNOSIS — G309 Alzheimer's disease, unspecified: Secondary | ICD-10-CM | POA: Diagnosis not present

## 2023-10-14 DIAGNOSIS — G309 Alzheimer's disease, unspecified: Secondary | ICD-10-CM | POA: Diagnosis not present

## 2023-10-14 DIAGNOSIS — M2560 Stiffness of unspecified joint, not elsewhere classified: Secondary | ICD-10-CM | POA: Diagnosis not present

## 2023-10-14 DIAGNOSIS — M2450 Contracture, unspecified joint: Secondary | ICD-10-CM | POA: Diagnosis not present

## 2023-10-14 DIAGNOSIS — R293 Abnormal posture: Secondary | ICD-10-CM | POA: Diagnosis not present

## 2023-10-15 DIAGNOSIS — R1313 Dysphagia, pharyngeal phase: Secondary | ICD-10-CM | POA: Diagnosis not present

## 2023-10-15 DIAGNOSIS — R293 Abnormal posture: Secondary | ICD-10-CM | POA: Diagnosis not present

## 2023-10-15 DIAGNOSIS — G309 Alzheimer's disease, unspecified: Secondary | ICD-10-CM | POA: Diagnosis not present

## 2023-10-15 DIAGNOSIS — M2450 Contracture, unspecified joint: Secondary | ICD-10-CM | POA: Diagnosis not present

## 2023-10-15 DIAGNOSIS — M2560 Stiffness of unspecified joint, not elsewhere classified: Secondary | ICD-10-CM | POA: Diagnosis not present

## 2023-10-16 DIAGNOSIS — G309 Alzheimer's disease, unspecified: Secondary | ICD-10-CM | POA: Diagnosis not present

## 2023-10-16 DIAGNOSIS — M2560 Stiffness of unspecified joint, not elsewhere classified: Secondary | ICD-10-CM | POA: Diagnosis not present

## 2023-10-16 DIAGNOSIS — R293 Abnormal posture: Secondary | ICD-10-CM | POA: Diagnosis not present

## 2023-10-16 DIAGNOSIS — R1313 Dysphagia, pharyngeal phase: Secondary | ICD-10-CM | POA: Diagnosis not present

## 2023-10-16 DIAGNOSIS — M2450 Contracture, unspecified joint: Secondary | ICD-10-CM | POA: Diagnosis not present

## 2023-10-17 DIAGNOSIS — G309 Alzheimer's disease, unspecified: Secondary | ICD-10-CM | POA: Diagnosis not present

## 2023-10-17 DIAGNOSIS — R293 Abnormal posture: Secondary | ICD-10-CM | POA: Diagnosis not present

## 2023-10-17 DIAGNOSIS — M2560 Stiffness of unspecified joint, not elsewhere classified: Secondary | ICD-10-CM | POA: Diagnosis not present

## 2023-10-17 DIAGNOSIS — M2450 Contracture, unspecified joint: Secondary | ICD-10-CM | POA: Diagnosis not present

## 2023-10-17 DIAGNOSIS — R1313 Dysphagia, pharyngeal phase: Secondary | ICD-10-CM | POA: Diagnosis not present

## 2023-10-18 DIAGNOSIS — J4520 Mild intermittent asthma, uncomplicated: Secondary | ICD-10-CM | POA: Diagnosis not present

## 2023-10-18 DIAGNOSIS — M2450 Contracture, unspecified joint: Secondary | ICD-10-CM | POA: Diagnosis not present

## 2023-10-18 DIAGNOSIS — R293 Abnormal posture: Secondary | ICD-10-CM | POA: Diagnosis not present

## 2023-10-18 DIAGNOSIS — G629 Polyneuropathy, unspecified: Secondary | ICD-10-CM | POA: Diagnosis not present

## 2023-10-18 DIAGNOSIS — R1313 Dysphagia, pharyngeal phase: Secondary | ICD-10-CM | POA: Diagnosis not present

## 2023-10-18 DIAGNOSIS — G309 Alzheimer's disease, unspecified: Secondary | ICD-10-CM | POA: Diagnosis not present

## 2023-10-18 DIAGNOSIS — M2560 Stiffness of unspecified joint, not elsewhere classified: Secondary | ICD-10-CM | POA: Diagnosis not present

## 2023-10-18 DIAGNOSIS — E440 Moderate protein-calorie malnutrition: Secondary | ICD-10-CM | POA: Diagnosis not present

## 2023-10-19 DIAGNOSIS — G309 Alzheimer's disease, unspecified: Secondary | ICD-10-CM | POA: Diagnosis not present

## 2023-10-19 DIAGNOSIS — M2450 Contracture, unspecified joint: Secondary | ICD-10-CM | POA: Diagnosis not present

## 2023-10-19 DIAGNOSIS — R1313 Dysphagia, pharyngeal phase: Secondary | ICD-10-CM | POA: Diagnosis not present

## 2023-10-19 DIAGNOSIS — R293 Abnormal posture: Secondary | ICD-10-CM | POA: Diagnosis not present

## 2023-10-19 DIAGNOSIS — M2560 Stiffness of unspecified joint, not elsewhere classified: Secondary | ICD-10-CM | POA: Diagnosis not present

## 2023-10-22 DIAGNOSIS — M2560 Stiffness of unspecified joint, not elsewhere classified: Secondary | ICD-10-CM | POA: Diagnosis not present

## 2023-10-22 DIAGNOSIS — R293 Abnormal posture: Secondary | ICD-10-CM | POA: Diagnosis not present

## 2023-10-22 DIAGNOSIS — G309 Alzheimer's disease, unspecified: Secondary | ICD-10-CM | POA: Diagnosis not present

## 2023-10-22 DIAGNOSIS — R1313 Dysphagia, pharyngeal phase: Secondary | ICD-10-CM | POA: Diagnosis not present

## 2023-10-22 DIAGNOSIS — M2450 Contracture, unspecified joint: Secondary | ICD-10-CM | POA: Diagnosis not present

## 2023-10-23 DIAGNOSIS — R293 Abnormal posture: Secondary | ICD-10-CM | POA: Diagnosis not present

## 2023-10-23 DIAGNOSIS — M2560 Stiffness of unspecified joint, not elsewhere classified: Secondary | ICD-10-CM | POA: Diagnosis not present

## 2023-10-23 DIAGNOSIS — R1313 Dysphagia, pharyngeal phase: Secondary | ICD-10-CM | POA: Diagnosis not present

## 2023-10-23 DIAGNOSIS — M2450 Contracture, unspecified joint: Secondary | ICD-10-CM | POA: Diagnosis not present

## 2023-10-23 DIAGNOSIS — G309 Alzheimer's disease, unspecified: Secondary | ICD-10-CM | POA: Diagnosis not present

## 2023-10-24 DIAGNOSIS — R293 Abnormal posture: Secondary | ICD-10-CM | POA: Diagnosis not present

## 2023-10-24 DIAGNOSIS — R1313 Dysphagia, pharyngeal phase: Secondary | ICD-10-CM | POA: Diagnosis not present

## 2023-10-24 DIAGNOSIS — M2450 Contracture, unspecified joint: Secondary | ICD-10-CM | POA: Diagnosis not present

## 2023-10-24 DIAGNOSIS — G309 Alzheimer's disease, unspecified: Secondary | ICD-10-CM | POA: Diagnosis not present

## 2023-10-24 DIAGNOSIS — M2560 Stiffness of unspecified joint, not elsewhere classified: Secondary | ICD-10-CM | POA: Diagnosis not present

## 2023-10-25 DIAGNOSIS — M2450 Contracture, unspecified joint: Secondary | ICD-10-CM | POA: Diagnosis not present

## 2023-10-25 DIAGNOSIS — R1313 Dysphagia, pharyngeal phase: Secondary | ICD-10-CM | POA: Diagnosis not present

## 2023-10-25 DIAGNOSIS — G309 Alzheimer's disease, unspecified: Secondary | ICD-10-CM | POA: Diagnosis not present

## 2023-10-25 DIAGNOSIS — R293 Abnormal posture: Secondary | ICD-10-CM | POA: Diagnosis not present

## 2023-10-25 DIAGNOSIS — M2560 Stiffness of unspecified joint, not elsewhere classified: Secondary | ICD-10-CM | POA: Diagnosis not present

## 2023-10-26 DIAGNOSIS — R293 Abnormal posture: Secondary | ICD-10-CM | POA: Diagnosis not present

## 2023-10-26 DIAGNOSIS — R1313 Dysphagia, pharyngeal phase: Secondary | ICD-10-CM | POA: Diagnosis not present

## 2023-10-26 DIAGNOSIS — M2450 Contracture, unspecified joint: Secondary | ICD-10-CM | POA: Diagnosis not present

## 2023-10-26 DIAGNOSIS — M2560 Stiffness of unspecified joint, not elsewhere classified: Secondary | ICD-10-CM | POA: Diagnosis not present

## 2023-10-26 DIAGNOSIS — G309 Alzheimer's disease, unspecified: Secondary | ICD-10-CM | POA: Diagnosis not present

## 2023-10-29 DIAGNOSIS — R1313 Dysphagia, pharyngeal phase: Secondary | ICD-10-CM | POA: Diagnosis not present

## 2023-10-29 DIAGNOSIS — R293 Abnormal posture: Secondary | ICD-10-CM | POA: Diagnosis not present

## 2023-10-29 DIAGNOSIS — M2450 Contracture, unspecified joint: Secondary | ICD-10-CM | POA: Diagnosis not present

## 2023-10-29 DIAGNOSIS — M2560 Stiffness of unspecified joint, not elsewhere classified: Secondary | ICD-10-CM | POA: Diagnosis not present

## 2023-10-29 DIAGNOSIS — G309 Alzheimer's disease, unspecified: Secondary | ICD-10-CM | POA: Diagnosis not present

## 2023-10-30 DIAGNOSIS — M2450 Contracture, unspecified joint: Secondary | ICD-10-CM | POA: Diagnosis not present

## 2023-10-30 DIAGNOSIS — R1313 Dysphagia, pharyngeal phase: Secondary | ICD-10-CM | POA: Diagnosis not present

## 2023-10-30 DIAGNOSIS — M2560 Stiffness of unspecified joint, not elsewhere classified: Secondary | ICD-10-CM | POA: Diagnosis not present

## 2023-10-30 DIAGNOSIS — G309 Alzheimer's disease, unspecified: Secondary | ICD-10-CM | POA: Diagnosis not present

## 2023-10-30 DIAGNOSIS — R293 Abnormal posture: Secondary | ICD-10-CM | POA: Diagnosis not present

## 2023-10-31 DIAGNOSIS — G309 Alzheimer's disease, unspecified: Secondary | ICD-10-CM | POA: Diagnosis not present

## 2023-10-31 DIAGNOSIS — R1313 Dysphagia, pharyngeal phase: Secondary | ICD-10-CM | POA: Diagnosis not present

## 2023-10-31 DIAGNOSIS — M2450 Contracture, unspecified joint: Secondary | ICD-10-CM | POA: Diagnosis not present

## 2023-10-31 DIAGNOSIS — R293 Abnormal posture: Secondary | ICD-10-CM | POA: Diagnosis not present

## 2023-10-31 DIAGNOSIS — M2560 Stiffness of unspecified joint, not elsewhere classified: Secondary | ICD-10-CM | POA: Diagnosis not present

## 2023-11-01 DIAGNOSIS — G309 Alzheimer's disease, unspecified: Secondary | ICD-10-CM | POA: Diagnosis not present

## 2023-11-01 DIAGNOSIS — M2560 Stiffness of unspecified joint, not elsewhere classified: Secondary | ICD-10-CM | POA: Diagnosis not present

## 2023-11-01 DIAGNOSIS — G300 Alzheimer's disease with early onset: Secondary | ICD-10-CM | POA: Diagnosis not present

## 2023-11-01 DIAGNOSIS — R293 Abnormal posture: Secondary | ICD-10-CM | POA: Diagnosis not present

## 2023-11-01 DIAGNOSIS — M2450 Contracture, unspecified joint: Secondary | ICD-10-CM | POA: Diagnosis not present

## 2023-11-01 DIAGNOSIS — E440 Moderate protein-calorie malnutrition: Secondary | ICD-10-CM | POA: Diagnosis not present

## 2023-11-01 DIAGNOSIS — R1313 Dysphagia, pharyngeal phase: Secondary | ICD-10-CM | POA: Diagnosis not present

## 2023-11-01 DIAGNOSIS — R5381 Other malaise: Secondary | ICD-10-CM | POA: Diagnosis not present

## 2023-11-02 DIAGNOSIS — G309 Alzheimer's disease, unspecified: Secondary | ICD-10-CM | POA: Diagnosis not present

## 2023-11-02 DIAGNOSIS — M2560 Stiffness of unspecified joint, not elsewhere classified: Secondary | ICD-10-CM | POA: Diagnosis not present

## 2023-11-02 DIAGNOSIS — R293 Abnormal posture: Secondary | ICD-10-CM | POA: Diagnosis not present

## 2023-11-02 DIAGNOSIS — R1313 Dysphagia, pharyngeal phase: Secondary | ICD-10-CM | POA: Diagnosis not present

## 2023-11-02 DIAGNOSIS — M2450 Contracture, unspecified joint: Secondary | ICD-10-CM | POA: Diagnosis not present

## 2023-11-03 DIAGNOSIS — R1313 Dysphagia, pharyngeal phase: Secondary | ICD-10-CM | POA: Diagnosis not present

## 2023-11-03 DIAGNOSIS — M2450 Contracture, unspecified joint: Secondary | ICD-10-CM | POA: Diagnosis not present

## 2023-11-03 DIAGNOSIS — G309 Alzheimer's disease, unspecified: Secondary | ICD-10-CM | POA: Diagnosis not present

## 2023-11-03 DIAGNOSIS — R293 Abnormal posture: Secondary | ICD-10-CM | POA: Diagnosis not present

## 2023-11-03 DIAGNOSIS — M2560 Stiffness of unspecified joint, not elsewhere classified: Secondary | ICD-10-CM | POA: Diagnosis not present

## 2023-11-04 DIAGNOSIS — M2560 Stiffness of unspecified joint, not elsewhere classified: Secondary | ICD-10-CM | POA: Diagnosis not present

## 2023-11-04 DIAGNOSIS — G309 Alzheimer's disease, unspecified: Secondary | ICD-10-CM | POA: Diagnosis not present

## 2023-11-04 DIAGNOSIS — M2450 Contracture, unspecified joint: Secondary | ICD-10-CM | POA: Diagnosis not present

## 2023-11-04 DIAGNOSIS — R1313 Dysphagia, pharyngeal phase: Secondary | ICD-10-CM | POA: Diagnosis not present

## 2023-11-04 DIAGNOSIS — R293 Abnormal posture: Secondary | ICD-10-CM | POA: Diagnosis not present

## 2023-11-05 DIAGNOSIS — G309 Alzheimer's disease, unspecified: Secondary | ICD-10-CM | POA: Diagnosis not present

## 2023-11-05 DIAGNOSIS — M2560 Stiffness of unspecified joint, not elsewhere classified: Secondary | ICD-10-CM | POA: Diagnosis not present

## 2023-11-05 DIAGNOSIS — M2450 Contracture, unspecified joint: Secondary | ICD-10-CM | POA: Diagnosis not present

## 2023-11-05 DIAGNOSIS — R1313 Dysphagia, pharyngeal phase: Secondary | ICD-10-CM | POA: Diagnosis not present

## 2023-11-05 DIAGNOSIS — R293 Abnormal posture: Secondary | ICD-10-CM | POA: Diagnosis not present

## 2023-11-06 DIAGNOSIS — I10 Essential (primary) hypertension: Secondary | ICD-10-CM | POA: Diagnosis not present

## 2023-11-06 DIAGNOSIS — L89202 Pressure ulcer of unspecified hip, stage 2: Secondary | ICD-10-CM | POA: Diagnosis not present

## 2023-11-06 DIAGNOSIS — J45909 Unspecified asthma, uncomplicated: Secondary | ICD-10-CM | POA: Diagnosis not present

## 2023-11-06 DIAGNOSIS — G8929 Other chronic pain: Secondary | ICD-10-CM | POA: Diagnosis not present

## 2023-11-06 DIAGNOSIS — E785 Hyperlipidemia, unspecified: Secondary | ICD-10-CM | POA: Diagnosis not present

## 2023-11-06 DIAGNOSIS — G300 Alzheimer's disease with early onset: Secondary | ICD-10-CM | POA: Diagnosis not present

## 2023-11-08 DIAGNOSIS — G629 Polyneuropathy, unspecified: Secondary | ICD-10-CM | POA: Diagnosis not present

## 2023-11-08 DIAGNOSIS — E440 Moderate protein-calorie malnutrition: Secondary | ICD-10-CM | POA: Diagnosis not present

## 2023-11-08 DIAGNOSIS — R1313 Dysphagia, pharyngeal phase: Secondary | ICD-10-CM | POA: Diagnosis not present

## 2023-11-08 DIAGNOSIS — M2560 Stiffness of unspecified joint, not elsewhere classified: Secondary | ICD-10-CM | POA: Diagnosis not present

## 2023-11-08 DIAGNOSIS — J4520 Mild intermittent asthma, uncomplicated: Secondary | ICD-10-CM | POA: Diagnosis not present

## 2023-11-08 DIAGNOSIS — M2450 Contracture, unspecified joint: Secondary | ICD-10-CM | POA: Diagnosis not present

## 2023-11-08 DIAGNOSIS — G309 Alzheimer's disease, unspecified: Secondary | ICD-10-CM | POA: Diagnosis not present

## 2023-11-08 DIAGNOSIS — R293 Abnormal posture: Secondary | ICD-10-CM | POA: Diagnosis not present

## 2023-11-09 DIAGNOSIS — M2450 Contracture, unspecified joint: Secondary | ICD-10-CM | POA: Diagnosis not present

## 2023-11-09 DIAGNOSIS — G309 Alzheimer's disease, unspecified: Secondary | ICD-10-CM | POA: Diagnosis not present

## 2023-11-09 DIAGNOSIS — R293 Abnormal posture: Secondary | ICD-10-CM | POA: Diagnosis not present

## 2023-11-09 DIAGNOSIS — M2560 Stiffness of unspecified joint, not elsewhere classified: Secondary | ICD-10-CM | POA: Diagnosis not present

## 2023-11-09 DIAGNOSIS — R1313 Dysphagia, pharyngeal phase: Secondary | ICD-10-CM | POA: Diagnosis not present

## 2023-12-24 NOTE — Progress Notes (Signed)
 Sherry Hicks                                          MRN: [id number]   12/24/2023   The [facility] Quality Team Specialist reviewed this patient medical record for the purposes of chart review for care gap closure. The following were reviewed: chart review for care gap closure-kidney health evaluation for diabetes:uACR.    [facility] Quality Team

## 2024-01-07 NOTE — Progress Notes (Signed)
 BRESHAY ILG                                          MRN: [id number]   01/07/2024   The [facility] Quality Team Specialist reviewed this patient medical record for the purposes of chart review for care gap closure. The following were reviewed: chart review for care gap closure-diabetic eye exam.    [facility] Quality Team
